# Patient Record
Sex: Male | Born: 1935
Health system: Southern US, Community
[De-identification: ages and names within clinical notes are randomized; demographics above are authoritative.]

## PROBLEM LIST (undated history)

## (undated) DIAGNOSIS — E785 Hyperlipidemia, unspecified: Secondary | ICD-10-CM

## (undated) DIAGNOSIS — G7000 Myasthenia gravis without (acute) exacerbation: Secondary | ICD-10-CM

## (undated) DIAGNOSIS — K219 Gastro-esophageal reflux disease without esophagitis: Secondary | ICD-10-CM

## (undated) DIAGNOSIS — G9520 Unspecified cord compression: Secondary | ICD-10-CM

## (undated) DIAGNOSIS — M1990 Unspecified osteoarthritis, unspecified site: Secondary | ICD-10-CM

## (undated) DIAGNOSIS — H409 Unspecified glaucoma: Secondary | ICD-10-CM

## (undated) DIAGNOSIS — J189 Pneumonia, unspecified organism: Secondary | ICD-10-CM

## (undated) DIAGNOSIS — S7291XA Unspecified fracture of right femur, initial encounter for closed fracture: Secondary | ICD-10-CM

## (undated) DIAGNOSIS — E039 Hypothyroidism, unspecified: Secondary | ICD-10-CM

## (undated) DIAGNOSIS — J4489 Other specified chronic obstructive pulmonary disease: Secondary | ICD-10-CM

## (undated) DIAGNOSIS — I10 Essential (primary) hypertension: Secondary | ICD-10-CM

## (undated) DIAGNOSIS — R202 Paresthesia of skin: Secondary | ICD-10-CM

## (undated) DIAGNOSIS — J449 Chronic obstructive pulmonary disease, unspecified: Secondary | ICD-10-CM

## (undated) DIAGNOSIS — I4821 Permanent atrial fibrillation: Secondary | ICD-10-CM

## (undated) HISTORY — DX: Unspecified osteoarthritis, unspecified site: M19.90

## (undated) HISTORY — DX: Gastro-esophageal reflux disease without esophagitis: K21.9

## (undated) HISTORY — PX: BACK SURGERY: SHX140

## (undated) HISTORY — DX: Myasthenia gravis without (acute) exacerbation: G70.00

## (undated) HISTORY — DX: Hypothyroidism, unspecified: E03.9

## (undated) HISTORY — DX: Chronic obstructive pulmonary disease, unspecified: J44.9

## (undated) HISTORY — PX: CHOLECYSTECTOMY: SHX55

## (undated) HISTORY — DX: Unspecified glaucoma: H40.9

## (undated) HISTORY — DX: Permanent atrial fibrillation: I48.21

## (undated) HISTORY — PX: CARPAL TUNNEL RELEASE: SHX101

## (undated) HISTORY — PX: REPLACEMENT TOTAL KNEE: SUR1224

## (undated) HISTORY — DX: Unspecified fracture of right femur, initial encounter for closed fracture: S72.91XA

## (undated) HISTORY — DX: Other specified chronic obstructive pulmonary disease: J44.89

## (undated) HISTORY — DX: Hyperlipidemia, unspecified: E78.5

## (undated) HISTORY — DX: Essential (primary) hypertension: I10

---

## 2014-09-30 DIAGNOSIS — L03114 Cellulitis of left upper limb: Secondary | ICD-10-CM | POA: Diagnosis not present

## 2014-09-30 DIAGNOSIS — N289 Disorder of kidney and ureter, unspecified: Secondary | ICD-10-CM | POA: Diagnosis not present

## 2014-09-30 DIAGNOSIS — G7000 Myasthenia gravis without (acute) exacerbation: Secondary | ICD-10-CM | POA: Diagnosis not present

## 2015-03-11 DIAGNOSIS — J4541 Moderate persistent asthma with (acute) exacerbation: Secondary | ICD-10-CM | POA: Diagnosis not present

## 2015-03-11 DIAGNOSIS — R05 Cough: Secondary | ICD-10-CM | POA: Diagnosis not present

## 2015-03-11 DIAGNOSIS — R0602 Shortness of breath: Secondary | ICD-10-CM | POA: Diagnosis not present

## 2015-03-11 DIAGNOSIS — G7000 Myasthenia gravis without (acute) exacerbation: Secondary | ICD-10-CM | POA: Diagnosis not present

## 2015-03-11 DIAGNOSIS — R062 Wheezing: Secondary | ICD-10-CM | POA: Diagnosis not present

## 2015-03-11 DIAGNOSIS — I482 Chronic atrial fibrillation: Secondary | ICD-10-CM | POA: Diagnosis not present

## 2015-03-27 DIAGNOSIS — H401132 Primary open-angle glaucoma, bilateral, moderate stage: Secondary | ICD-10-CM | POA: Diagnosis not present

## 2015-03-27 DIAGNOSIS — H401192 Primary open-angle glaucoma, unspecified eye, moderate stage: Secondary | ICD-10-CM | POA: Diagnosis not present

## 2015-03-27 DIAGNOSIS — H04123 Dry eye syndrome of bilateral lacrimal glands: Secondary | ICD-10-CM | POA: Diagnosis not present

## 2015-03-27 DIAGNOSIS — H2511 Age-related nuclear cataract, right eye: Secondary | ICD-10-CM | POA: Diagnosis not present

## 2015-03-27 DIAGNOSIS — Z961 Presence of intraocular lens: Secondary | ICD-10-CM | POA: Diagnosis not present

## 2015-03-30 DIAGNOSIS — H04123 Dry eye syndrome of bilateral lacrimal glands: Secondary | ICD-10-CM | POA: Diagnosis not present

## 2015-03-30 DIAGNOSIS — H2511 Age-related nuclear cataract, right eye: Secondary | ICD-10-CM | POA: Diagnosis not present

## 2015-03-30 DIAGNOSIS — Z961 Presence of intraocular lens: Secondary | ICD-10-CM | POA: Diagnosis not present

## 2015-03-30 DIAGNOSIS — H401192 Primary open-angle glaucoma, unspecified eye, moderate stage: Secondary | ICD-10-CM | POA: Diagnosis not present

## 2015-03-30 DIAGNOSIS — H401132 Primary open-angle glaucoma, bilateral, moderate stage: Secondary | ICD-10-CM | POA: Diagnosis not present

## 2015-03-31 DIAGNOSIS — B351 Tinea unguium: Secondary | ICD-10-CM | POA: Diagnosis not present

## 2015-03-31 DIAGNOSIS — M79672 Pain in left foot: Secondary | ICD-10-CM | POA: Diagnosis not present

## 2015-03-31 DIAGNOSIS — M79671 Pain in right foot: Secondary | ICD-10-CM | POA: Diagnosis not present

## 2015-04-03 DIAGNOSIS — I10 Essential (primary) hypertension: Secondary | ICD-10-CM | POA: Diagnosis not present

## 2015-04-03 DIAGNOSIS — R5383 Other fatigue: Secondary | ICD-10-CM | POA: Diagnosis not present

## 2015-04-03 DIAGNOSIS — H401132 Primary open-angle glaucoma, bilateral, moderate stage: Secondary | ICD-10-CM | POA: Diagnosis not present

## 2015-04-03 DIAGNOSIS — R942 Abnormal results of pulmonary function studies: Secondary | ICD-10-CM | POA: Diagnosis not present

## 2015-04-07 DIAGNOSIS — L570 Actinic keratosis: Secondary | ICD-10-CM | POA: Diagnosis not present

## 2015-04-21 DIAGNOSIS — G7000 Myasthenia gravis without (acute) exacerbation: Secondary | ICD-10-CM | POA: Diagnosis not present

## 2015-04-21 DIAGNOSIS — I482 Chronic atrial fibrillation: Secondary | ICD-10-CM | POA: Diagnosis not present

## 2015-04-21 DIAGNOSIS — R0602 Shortness of breath: Secondary | ICD-10-CM | POA: Diagnosis not present

## 2015-04-21 DIAGNOSIS — R05 Cough: Secondary | ICD-10-CM | POA: Diagnosis not present

## 2015-04-21 DIAGNOSIS — J4540 Moderate persistent asthma, uncomplicated: Secondary | ICD-10-CM | POA: Diagnosis not present

## 2015-04-21 DIAGNOSIS — R062 Wheezing: Secondary | ICD-10-CM | POA: Diagnosis not present

## 2015-05-01 DIAGNOSIS — G8929 Other chronic pain: Secondary | ICD-10-CM | POA: Diagnosis not present

## 2015-05-01 DIAGNOSIS — I10 Essential (primary) hypertension: Secondary | ICD-10-CM | POA: Diagnosis not present

## 2015-05-01 DIAGNOSIS — M549 Dorsalgia, unspecified: Secondary | ICD-10-CM | POA: Diagnosis not present

## 2015-05-01 DIAGNOSIS — R942 Abnormal results of pulmonary function studies: Secondary | ICD-10-CM | POA: Diagnosis not present

## 2015-05-19 DIAGNOSIS — M25529 Pain in unspecified elbow: Secondary | ICD-10-CM | POA: Diagnosis not present

## 2015-05-19 DIAGNOSIS — M7022 Olecranon bursitis, left elbow: Secondary | ICD-10-CM | POA: Diagnosis not present

## 2015-06-02 DIAGNOSIS — M7022 Olecranon bursitis, left elbow: Secondary | ICD-10-CM | POA: Diagnosis not present

## 2015-06-02 DIAGNOSIS — M25529 Pain in unspecified elbow: Secondary | ICD-10-CM | POA: Diagnosis not present

## 2015-06-09 DIAGNOSIS — M549 Dorsalgia, unspecified: Secondary | ICD-10-CM | POA: Diagnosis not present

## 2015-06-09 DIAGNOSIS — M7022 Olecranon bursitis, left elbow: Secondary | ICD-10-CM | POA: Diagnosis not present

## 2015-06-09 DIAGNOSIS — G8929 Other chronic pain: Secondary | ICD-10-CM | POA: Diagnosis not present

## 2015-06-09 DIAGNOSIS — M65341 Trigger finger, right ring finger: Secondary | ICD-10-CM | POA: Diagnosis not present

## 2015-06-10 DIAGNOSIS — E782 Mixed hyperlipidemia: Secondary | ICD-10-CM | POA: Diagnosis not present

## 2015-06-10 DIAGNOSIS — I10 Essential (primary) hypertension: Secondary | ICD-10-CM | POA: Diagnosis not present

## 2015-06-10 DIAGNOSIS — I4891 Unspecified atrial fibrillation: Secondary | ICD-10-CM | POA: Diagnosis not present

## 2015-06-16 DIAGNOSIS — I4891 Unspecified atrial fibrillation: Secondary | ICD-10-CM | POA: Diagnosis not present

## 2015-06-17 DIAGNOSIS — M7022 Olecranon bursitis, left elbow: Secondary | ICD-10-CM | POA: Diagnosis not present

## 2015-06-17 DIAGNOSIS — M65341 Trigger finger, right ring finger: Secondary | ICD-10-CM | POA: Diagnosis not present

## 2015-06-17 DIAGNOSIS — M779 Enthesopathy, unspecified: Secondary | ICD-10-CM | POA: Diagnosis not present

## 2015-06-17 DIAGNOSIS — I10 Essential (primary) hypertension: Secondary | ICD-10-CM | POA: Diagnosis not present

## 2015-06-17 DIAGNOSIS — L02414 Cutaneous abscess of left upper limb: Secondary | ICD-10-CM | POA: Diagnosis not present

## 2015-06-17 DIAGNOSIS — E039 Hypothyroidism, unspecified: Secondary | ICD-10-CM | POA: Diagnosis not present

## 2015-06-17 DIAGNOSIS — E559 Vitamin D deficiency, unspecified: Secondary | ICD-10-CM | POA: Diagnosis not present

## 2015-06-17 DIAGNOSIS — T814XXA Infection following a procedure, initial encounter: Secondary | ICD-10-CM | POA: Diagnosis not present

## 2015-06-17 DIAGNOSIS — I4891 Unspecified atrial fibrillation: Secondary | ICD-10-CM | POA: Diagnosis not present

## 2015-06-17 DIAGNOSIS — G4700 Insomnia, unspecified: Secondary | ICD-10-CM | POA: Diagnosis not present

## 2015-06-17 DIAGNOSIS — E785 Hyperlipidemia, unspecified: Secondary | ICD-10-CM | POA: Diagnosis not present

## 2015-06-17 DIAGNOSIS — Z96653 Presence of artificial knee joint, bilateral: Secondary | ICD-10-CM | POA: Diagnosis not present

## 2015-06-17 DIAGNOSIS — Z9181 History of falling: Secondary | ICD-10-CM | POA: Diagnosis not present

## 2015-06-30 DIAGNOSIS — L97521 Non-pressure chronic ulcer of other part of left foot limited to breakdown of skin: Secondary | ICD-10-CM | POA: Diagnosis not present

## 2015-06-30 DIAGNOSIS — M71121 Other infective bursitis, right elbow: Secondary | ICD-10-CM | POA: Diagnosis not present

## 2015-06-30 DIAGNOSIS — R52 Pain, unspecified: Secondary | ICD-10-CM | POA: Diagnosis not present

## 2015-06-30 DIAGNOSIS — D72828 Other elevated white blood cell count: Secondary | ICD-10-CM | POA: Diagnosis not present

## 2015-06-30 DIAGNOSIS — G7000 Myasthenia gravis without (acute) exacerbation: Secondary | ICD-10-CM | POA: Diagnosis not present

## 2015-06-30 DIAGNOSIS — A4101 Sepsis due to Methicillin susceptible Staphylococcus aureus: Secondary | ICD-10-CM | POA: Diagnosis not present

## 2015-06-30 DIAGNOSIS — I10 Essential (primary) hypertension: Secondary | ICD-10-CM | POA: Diagnosis not present

## 2015-06-30 DIAGNOSIS — L089 Local infection of the skin and subcutaneous tissue, unspecified: Secondary | ICD-10-CM | POA: Diagnosis not present

## 2015-06-30 DIAGNOSIS — I361 Nonrheumatic tricuspid (valve) insufficiency: Secondary | ICD-10-CM | POA: Diagnosis not present

## 2015-06-30 DIAGNOSIS — M792 Neuralgia and neuritis, unspecified: Secondary | ICD-10-CM | POA: Diagnosis not present

## 2015-06-30 DIAGNOSIS — Z7401 Bed confinement status: Secondary | ICD-10-CM | POA: Diagnosis not present

## 2015-06-30 DIAGNOSIS — M7022 Olecranon bursitis, left elbow: Secondary | ICD-10-CM | POA: Diagnosis not present

## 2015-06-30 DIAGNOSIS — M25512 Pain in left shoulder: Secondary | ICD-10-CM | POA: Diagnosis not present

## 2015-06-30 DIAGNOSIS — D62 Acute posthemorrhagic anemia: Secondary | ICD-10-CM | POA: Diagnosis not present

## 2015-06-30 DIAGNOSIS — G894 Chronic pain syndrome: Secondary | ICD-10-CM | POA: Diagnosis not present

## 2015-06-30 DIAGNOSIS — M79602 Pain in left arm: Secondary | ICD-10-CM | POA: Diagnosis not present

## 2015-06-30 DIAGNOSIS — R609 Edema, unspecified: Secondary | ICD-10-CM | POA: Diagnosis not present

## 2015-06-30 DIAGNOSIS — R296 Repeated falls: Secondary | ICD-10-CM | POA: Diagnosis not present

## 2015-06-30 DIAGNOSIS — G629 Polyneuropathy, unspecified: Secondary | ICD-10-CM | POA: Diagnosis not present

## 2015-06-30 DIAGNOSIS — L97511 Non-pressure chronic ulcer of other part of right foot limited to breakdown of skin: Secondary | ICD-10-CM | POA: Diagnosis not present

## 2015-06-30 DIAGNOSIS — I482 Chronic atrial fibrillation: Secondary | ICD-10-CM | POA: Diagnosis not present

## 2015-06-30 DIAGNOSIS — M25361 Other instability, right knee: Secondary | ICD-10-CM | POA: Diagnosis not present

## 2015-06-30 DIAGNOSIS — M545 Low back pain: Secondary | ICD-10-CM | POA: Diagnosis not present

## 2015-06-30 DIAGNOSIS — G8918 Other acute postprocedural pain: Secondary | ICD-10-CM | POA: Diagnosis not present

## 2015-06-30 DIAGNOSIS — E782 Mixed hyperlipidemia: Secondary | ICD-10-CM | POA: Diagnosis not present

## 2015-06-30 DIAGNOSIS — L03114 Cellulitis of left upper limb: Secondary | ICD-10-CM | POA: Diagnosis not present

## 2015-06-30 DIAGNOSIS — E871 Hypo-osmolality and hyponatremia: Secondary | ICD-10-CM | POA: Diagnosis not present

## 2015-06-30 DIAGNOSIS — R7881 Bacteremia: Secondary | ICD-10-CM | POA: Diagnosis not present

## 2015-06-30 DIAGNOSIS — R262 Difficulty in walking, not elsewhere classified: Secondary | ICD-10-CM | POA: Diagnosis not present

## 2015-06-30 DIAGNOSIS — M86132 Other acute osteomyelitis, left radius and ulna: Secondary | ICD-10-CM | POA: Diagnosis not present

## 2015-06-30 DIAGNOSIS — N289 Disorder of kidney and ureter, unspecified: Secondary | ICD-10-CM | POA: Diagnosis not present

## 2015-06-30 DIAGNOSIS — Z9889 Other specified postprocedural states: Secondary | ICD-10-CM | POA: Diagnosis not present

## 2015-06-30 DIAGNOSIS — T814XXA Infection following a procedure, initial encounter: Secondary | ICD-10-CM | POA: Diagnosis not present

## 2015-06-30 DIAGNOSIS — I129 Hypertensive chronic kidney disease with stage 1 through stage 4 chronic kidney disease, or unspecified chronic kidney disease: Secondary | ICD-10-CM | POA: Diagnosis not present

## 2015-06-30 DIAGNOSIS — M6281 Muscle weakness (generalized): Secondary | ICD-10-CM | POA: Diagnosis not present

## 2015-06-30 DIAGNOSIS — B9561 Methicillin susceptible Staphylococcus aureus infection as the cause of diseases classified elsewhere: Secondary | ICD-10-CM | POA: Diagnosis not present

## 2015-06-30 DIAGNOSIS — Z792 Long term (current) use of antibiotics: Secondary | ICD-10-CM | POA: Diagnosis not present

## 2015-06-30 DIAGNOSIS — L02414 Cutaneous abscess of left upper limb: Secondary | ICD-10-CM | POA: Diagnosis not present

## 2015-06-30 DIAGNOSIS — I4891 Unspecified atrial fibrillation: Secondary | ICD-10-CM | POA: Diagnosis not present

## 2015-06-30 DIAGNOSIS — M79671 Pain in right foot: Secondary | ICD-10-CM | POA: Diagnosis not present

## 2015-06-30 DIAGNOSIS — R69 Illness, unspecified: Secondary | ICD-10-CM | POA: Diagnosis not present

## 2015-06-30 DIAGNOSIS — R5381 Other malaise: Secondary | ICD-10-CM | POA: Diagnosis not present

## 2015-06-30 DIAGNOSIS — R2681 Unsteadiness on feet: Secondary | ICD-10-CM | POA: Diagnosis not present

## 2015-06-30 DIAGNOSIS — M79672 Pain in left foot: Secondary | ICD-10-CM | POA: Diagnosis not present

## 2015-06-30 DIAGNOSIS — M25522 Pain in left elbow: Secondary | ICD-10-CM | POA: Diagnosis not present

## 2015-07-16 DIAGNOSIS — R5381 Other malaise: Secondary | ICD-10-CM | POA: Diagnosis not present

## 2015-07-16 DIAGNOSIS — L309 Dermatitis, unspecified: Secondary | ICD-10-CM | POA: Diagnosis not present

## 2015-07-16 DIAGNOSIS — M545 Low back pain: Secondary | ICD-10-CM | POA: Diagnosis not present

## 2015-07-16 DIAGNOSIS — Z7401 Bed confinement status: Secondary | ICD-10-CM | POA: Diagnosis not present

## 2015-07-16 DIAGNOSIS — G8929 Other chronic pain: Secondary | ICD-10-CM | POA: Diagnosis not present

## 2015-07-16 DIAGNOSIS — E039 Hypothyroidism, unspecified: Secondary | ICD-10-CM | POA: Diagnosis not present

## 2015-07-16 DIAGNOSIS — R2681 Unsteadiness on feet: Secondary | ICD-10-CM | POA: Diagnosis not present

## 2015-07-16 DIAGNOSIS — G894 Chronic pain syndrome: Secondary | ICD-10-CM | POA: Diagnosis not present

## 2015-07-16 DIAGNOSIS — L97511 Non-pressure chronic ulcer of other part of right foot limited to breakdown of skin: Secondary | ICD-10-CM | POA: Diagnosis not present

## 2015-07-16 DIAGNOSIS — R69 Illness, unspecified: Secondary | ICD-10-CM | POA: Diagnosis not present

## 2015-07-16 DIAGNOSIS — E559 Vitamin D deficiency, unspecified: Secondary | ICD-10-CM | POA: Diagnosis not present

## 2015-07-16 DIAGNOSIS — A419 Sepsis, unspecified organism: Secondary | ICD-10-CM | POA: Diagnosis not present

## 2015-07-16 DIAGNOSIS — D72828 Other elevated white blood cell count: Secondary | ICD-10-CM | POA: Diagnosis not present

## 2015-07-16 DIAGNOSIS — G7000 Myasthenia gravis without (acute) exacerbation: Secondary | ICD-10-CM | POA: Diagnosis not present

## 2015-07-16 DIAGNOSIS — R262 Difficulty in walking, not elsewhere classified: Secondary | ICD-10-CM | POA: Diagnosis not present

## 2015-07-16 DIAGNOSIS — L03114 Cellulitis of left upper limb: Secondary | ICD-10-CM | POA: Diagnosis not present

## 2015-07-16 DIAGNOSIS — I10 Essential (primary) hypertension: Secondary | ICD-10-CM | POA: Diagnosis not present

## 2015-07-16 DIAGNOSIS — E785 Hyperlipidemia, unspecified: Secondary | ICD-10-CM | POA: Diagnosis not present

## 2015-07-16 DIAGNOSIS — J45909 Unspecified asthma, uncomplicated: Secondary | ICD-10-CM | POA: Diagnosis not present

## 2015-07-16 DIAGNOSIS — R7881 Bacteremia: Secondary | ICD-10-CM | POA: Diagnosis not present

## 2015-07-16 DIAGNOSIS — I482 Chronic atrial fibrillation: Secondary | ICD-10-CM | POA: Diagnosis not present

## 2015-07-16 DIAGNOSIS — B9561 Methicillin susceptible Staphylococcus aureus infection as the cause of diseases classified elsewhere: Secondary | ICD-10-CM | POA: Diagnosis not present

## 2015-07-16 DIAGNOSIS — I4891 Unspecified atrial fibrillation: Secondary | ICD-10-CM | POA: Diagnosis not present

## 2015-07-16 DIAGNOSIS — Z6825 Body mass index (BMI) 25.0-25.9, adult: Secondary | ICD-10-CM | POA: Diagnosis not present

## 2015-07-16 DIAGNOSIS — M6281 Muscle weakness (generalized): Secondary | ICD-10-CM | POA: Diagnosis not present

## 2015-07-16 DIAGNOSIS — R52 Pain, unspecified: Secondary | ICD-10-CM | POA: Diagnosis not present

## 2015-07-16 DIAGNOSIS — M71121 Other infective bursitis, right elbow: Secondary | ICD-10-CM | POA: Diagnosis not present

## 2015-07-16 DIAGNOSIS — N289 Disorder of kidney and ureter, unspecified: Secondary | ICD-10-CM | POA: Diagnosis not present

## 2015-07-16 DIAGNOSIS — L97521 Non-pressure chronic ulcer of other part of left foot limited to breakdown of skin: Secondary | ICD-10-CM | POA: Diagnosis not present

## 2015-07-16 DIAGNOSIS — M7022 Olecranon bursitis, left elbow: Secondary | ICD-10-CM | POA: Diagnosis not present

## 2015-07-16 DIAGNOSIS — K219 Gastro-esophageal reflux disease without esophagitis: Secondary | ICD-10-CM | POA: Diagnosis not present

## 2015-07-16 DIAGNOSIS — M79602 Pain in left arm: Secondary | ICD-10-CM | POA: Diagnosis not present

## 2015-07-16 DIAGNOSIS — L0390 Cellulitis, unspecified: Secondary | ICD-10-CM | POA: Diagnosis not present

## 2015-07-16 DIAGNOSIS — E782 Mixed hyperlipidemia: Secondary | ICD-10-CM | POA: Diagnosis not present

## 2015-07-17 DIAGNOSIS — L03114 Cellulitis of left upper limb: Secondary | ICD-10-CM | POA: Diagnosis not present

## 2015-07-17 DIAGNOSIS — N289 Disorder of kidney and ureter, unspecified: Secondary | ICD-10-CM | POA: Diagnosis not present

## 2015-07-17 DIAGNOSIS — M71121 Other infective bursitis, right elbow: Secondary | ICD-10-CM | POA: Diagnosis not present

## 2015-07-17 DIAGNOSIS — G894 Chronic pain syndrome: Secondary | ICD-10-CM | POA: Diagnosis not present

## 2015-07-17 DIAGNOSIS — B9561 Methicillin susceptible Staphylococcus aureus infection as the cause of diseases classified elsewhere: Secondary | ICD-10-CM | POA: Diagnosis not present

## 2015-07-17 DIAGNOSIS — G7000 Myasthenia gravis without (acute) exacerbation: Secondary | ICD-10-CM | POA: Diagnosis not present

## 2015-07-19 DIAGNOSIS — I4891 Unspecified atrial fibrillation: Secondary | ICD-10-CM | POA: Diagnosis not present

## 2015-07-19 DIAGNOSIS — R52 Pain, unspecified: Secondary | ICD-10-CM | POA: Diagnosis not present

## 2015-07-19 DIAGNOSIS — L0390 Cellulitis, unspecified: Secondary | ICD-10-CM | POA: Diagnosis not present

## 2015-07-19 DIAGNOSIS — M7022 Olecranon bursitis, left elbow: Secondary | ICD-10-CM | POA: Diagnosis not present

## 2015-07-20 DIAGNOSIS — G7000 Myasthenia gravis without (acute) exacerbation: Secondary | ICD-10-CM | POA: Diagnosis not present

## 2015-07-20 DIAGNOSIS — B9561 Methicillin susceptible Staphylococcus aureus infection as the cause of diseases classified elsewhere: Secondary | ICD-10-CM | POA: Diagnosis not present

## 2015-07-20 DIAGNOSIS — M71121 Other infective bursitis, right elbow: Secondary | ICD-10-CM | POA: Diagnosis not present

## 2015-07-20 DIAGNOSIS — A419 Sepsis, unspecified organism: Secondary | ICD-10-CM | POA: Diagnosis not present

## 2015-07-20 DIAGNOSIS — L03114 Cellulitis of left upper limb: Secondary | ICD-10-CM | POA: Diagnosis not present

## 2015-07-20 DIAGNOSIS — I10 Essential (primary) hypertension: Secondary | ICD-10-CM | POA: Diagnosis not present

## 2015-07-20 DIAGNOSIS — L97511 Non-pressure chronic ulcer of other part of right foot limited to breakdown of skin: Secondary | ICD-10-CM | POA: Diagnosis not present

## 2015-07-20 DIAGNOSIS — I4891 Unspecified atrial fibrillation: Secondary | ICD-10-CM | POA: Diagnosis not present

## 2015-07-20 DIAGNOSIS — N289 Disorder of kidney and ureter, unspecified: Secondary | ICD-10-CM | POA: Diagnosis not present

## 2015-07-20 DIAGNOSIS — G894 Chronic pain syndrome: Secondary | ICD-10-CM | POA: Diagnosis not present

## 2015-07-21 DIAGNOSIS — G7000 Myasthenia gravis without (acute) exacerbation: Secondary | ICD-10-CM | POA: Diagnosis not present

## 2015-07-21 DIAGNOSIS — N289 Disorder of kidney and ureter, unspecified: Secondary | ICD-10-CM | POA: Diagnosis not present

## 2015-07-21 DIAGNOSIS — I4891 Unspecified atrial fibrillation: Secondary | ICD-10-CM | POA: Diagnosis not present

## 2015-07-21 DIAGNOSIS — E785 Hyperlipidemia, unspecified: Secondary | ICD-10-CM | POA: Diagnosis not present

## 2015-07-21 DIAGNOSIS — L03114 Cellulitis of left upper limb: Secondary | ICD-10-CM | POA: Diagnosis not present

## 2015-07-21 DIAGNOSIS — M71121 Other infective bursitis, right elbow: Secondary | ICD-10-CM | POA: Diagnosis not present

## 2015-07-21 DIAGNOSIS — I10 Essential (primary) hypertension: Secondary | ICD-10-CM | POA: Diagnosis not present

## 2015-07-21 DIAGNOSIS — E559 Vitamin D deficiency, unspecified: Secondary | ICD-10-CM | POA: Diagnosis not present

## 2015-07-21 DIAGNOSIS — L97511 Non-pressure chronic ulcer of other part of right foot limited to breakdown of skin: Secondary | ICD-10-CM | POA: Diagnosis not present

## 2015-07-21 DIAGNOSIS — R69 Illness, unspecified: Secondary | ICD-10-CM | POA: Diagnosis not present

## 2015-07-21 DIAGNOSIS — J45909 Unspecified asthma, uncomplicated: Secondary | ICD-10-CM | POA: Diagnosis not present

## 2015-07-21 DIAGNOSIS — K219 Gastro-esophageal reflux disease without esophagitis: Secondary | ICD-10-CM | POA: Diagnosis not present

## 2015-07-21 DIAGNOSIS — G894 Chronic pain syndrome: Secondary | ICD-10-CM | POA: Diagnosis not present

## 2015-07-21 DIAGNOSIS — Z6825 Body mass index (BMI) 25.0-25.9, adult: Secondary | ICD-10-CM | POA: Diagnosis not present

## 2015-07-21 DIAGNOSIS — B9561 Methicillin susceptible Staphylococcus aureus infection as the cause of diseases classified elsewhere: Secondary | ICD-10-CM | POA: Diagnosis not present

## 2015-07-21 DIAGNOSIS — E039 Hypothyroidism, unspecified: Secondary | ICD-10-CM | POA: Diagnosis not present

## 2015-07-22 DIAGNOSIS — N289 Disorder of kidney and ureter, unspecified: Secondary | ICD-10-CM | POA: Diagnosis not present

## 2015-07-22 DIAGNOSIS — M71121 Other infective bursitis, right elbow: Secondary | ICD-10-CM | POA: Diagnosis not present

## 2015-07-22 DIAGNOSIS — G7000 Myasthenia gravis without (acute) exacerbation: Secondary | ICD-10-CM | POA: Diagnosis not present

## 2015-07-22 DIAGNOSIS — L03114 Cellulitis of left upper limb: Secondary | ICD-10-CM | POA: Diagnosis not present

## 2015-07-22 DIAGNOSIS — G894 Chronic pain syndrome: Secondary | ICD-10-CM | POA: Diagnosis not present

## 2015-07-22 DIAGNOSIS — L97511 Non-pressure chronic ulcer of other part of right foot limited to breakdown of skin: Secondary | ICD-10-CM | POA: Diagnosis not present

## 2015-07-22 DIAGNOSIS — B9561 Methicillin susceptible Staphylococcus aureus infection as the cause of diseases classified elsewhere: Secondary | ICD-10-CM | POA: Diagnosis not present

## 2015-07-23 DIAGNOSIS — N289 Disorder of kidney and ureter, unspecified: Secondary | ICD-10-CM | POA: Diagnosis not present

## 2015-07-23 DIAGNOSIS — L03114 Cellulitis of left upper limb: Secondary | ICD-10-CM | POA: Diagnosis not present

## 2015-07-23 DIAGNOSIS — G7000 Myasthenia gravis without (acute) exacerbation: Secondary | ICD-10-CM | POA: Diagnosis not present

## 2015-07-23 DIAGNOSIS — M71121 Other infective bursitis, right elbow: Secondary | ICD-10-CM | POA: Diagnosis not present

## 2015-07-23 DIAGNOSIS — L97511 Non-pressure chronic ulcer of other part of right foot limited to breakdown of skin: Secondary | ICD-10-CM | POA: Diagnosis not present

## 2015-07-23 DIAGNOSIS — B9561 Methicillin susceptible Staphylococcus aureus infection as the cause of diseases classified elsewhere: Secondary | ICD-10-CM | POA: Diagnosis not present

## 2015-07-23 DIAGNOSIS — G894 Chronic pain syndrome: Secondary | ICD-10-CM | POA: Diagnosis not present

## 2015-07-24 DIAGNOSIS — G7000 Myasthenia gravis without (acute) exacerbation: Secondary | ICD-10-CM | POA: Diagnosis not present

## 2015-07-24 DIAGNOSIS — L97511 Non-pressure chronic ulcer of other part of right foot limited to breakdown of skin: Secondary | ICD-10-CM | POA: Diagnosis not present

## 2015-07-24 DIAGNOSIS — M71121 Other infective bursitis, right elbow: Secondary | ICD-10-CM | POA: Diagnosis not present

## 2015-07-24 DIAGNOSIS — L03114 Cellulitis of left upper limb: Secondary | ICD-10-CM | POA: Diagnosis not present

## 2015-07-24 DIAGNOSIS — N289 Disorder of kidney and ureter, unspecified: Secondary | ICD-10-CM | POA: Diagnosis not present

## 2015-07-24 DIAGNOSIS — G894 Chronic pain syndrome: Secondary | ICD-10-CM | POA: Diagnosis not present

## 2015-07-24 DIAGNOSIS — B9561 Methicillin susceptible Staphylococcus aureus infection as the cause of diseases classified elsewhere: Secondary | ICD-10-CM | POA: Diagnosis not present

## 2015-07-25 DIAGNOSIS — I4891 Unspecified atrial fibrillation: Secondary | ICD-10-CM | POA: Diagnosis not present

## 2015-07-25 DIAGNOSIS — R52 Pain, unspecified: Secondary | ICD-10-CM | POA: Diagnosis not present

## 2015-07-25 DIAGNOSIS — L0390 Cellulitis, unspecified: Secondary | ICD-10-CM | POA: Diagnosis not present

## 2015-07-25 DIAGNOSIS — M7022 Olecranon bursitis, left elbow: Secondary | ICD-10-CM | POA: Diagnosis not present

## 2015-07-27 DIAGNOSIS — M71121 Other infective bursitis, right elbow: Secondary | ICD-10-CM | POA: Diagnosis not present

## 2015-07-27 DIAGNOSIS — G7000 Myasthenia gravis without (acute) exacerbation: Secondary | ICD-10-CM | POA: Diagnosis not present

## 2015-07-27 DIAGNOSIS — N289 Disorder of kidney and ureter, unspecified: Secondary | ICD-10-CM | POA: Diagnosis not present

## 2015-07-27 DIAGNOSIS — L03114 Cellulitis of left upper limb: Secondary | ICD-10-CM | POA: Diagnosis not present

## 2015-07-27 DIAGNOSIS — G894 Chronic pain syndrome: Secondary | ICD-10-CM | POA: Diagnosis not present

## 2015-07-27 DIAGNOSIS — B9561 Methicillin susceptible Staphylococcus aureus infection as the cause of diseases classified elsewhere: Secondary | ICD-10-CM | POA: Diagnosis not present

## 2015-07-27 DIAGNOSIS — L97511 Non-pressure chronic ulcer of other part of right foot limited to breakdown of skin: Secondary | ICD-10-CM | POA: Diagnosis not present

## 2015-07-28 DIAGNOSIS — G7000 Myasthenia gravis without (acute) exacerbation: Secondary | ICD-10-CM | POA: Diagnosis not present

## 2015-07-28 DIAGNOSIS — G894 Chronic pain syndrome: Secondary | ICD-10-CM | POA: Diagnosis not present

## 2015-07-28 DIAGNOSIS — M71121 Other infective bursitis, right elbow: Secondary | ICD-10-CM | POA: Diagnosis not present

## 2015-07-28 DIAGNOSIS — N289 Disorder of kidney and ureter, unspecified: Secondary | ICD-10-CM | POA: Diagnosis not present

## 2015-07-28 DIAGNOSIS — L97511 Non-pressure chronic ulcer of other part of right foot limited to breakdown of skin: Secondary | ICD-10-CM | POA: Diagnosis not present

## 2015-07-28 DIAGNOSIS — E039 Hypothyroidism, unspecified: Secondary | ICD-10-CM | POA: Diagnosis not present

## 2015-07-28 DIAGNOSIS — L03114 Cellulitis of left upper limb: Secondary | ICD-10-CM | POA: Diagnosis not present

## 2015-07-28 DIAGNOSIS — G8929 Other chronic pain: Secondary | ICD-10-CM | POA: Diagnosis not present

## 2015-07-28 DIAGNOSIS — I4891 Unspecified atrial fibrillation: Secondary | ICD-10-CM | POA: Diagnosis not present

## 2015-07-28 DIAGNOSIS — B9561 Methicillin susceptible Staphylococcus aureus infection as the cause of diseases classified elsewhere: Secondary | ICD-10-CM | POA: Diagnosis not present

## 2015-07-29 DIAGNOSIS — B9561 Methicillin susceptible Staphylococcus aureus infection as the cause of diseases classified elsewhere: Secondary | ICD-10-CM | POA: Diagnosis not present

## 2015-07-29 DIAGNOSIS — G7000 Myasthenia gravis without (acute) exacerbation: Secondary | ICD-10-CM | POA: Diagnosis not present

## 2015-07-29 DIAGNOSIS — M7022 Olecranon bursitis, left elbow: Secondary | ICD-10-CM | POA: Diagnosis not present

## 2015-07-29 DIAGNOSIS — L0390 Cellulitis, unspecified: Secondary | ICD-10-CM | POA: Diagnosis not present

## 2015-07-29 DIAGNOSIS — G894 Chronic pain syndrome: Secondary | ICD-10-CM | POA: Diagnosis not present

## 2015-07-29 DIAGNOSIS — I4891 Unspecified atrial fibrillation: Secondary | ICD-10-CM | POA: Diagnosis not present

## 2015-07-29 DIAGNOSIS — N289 Disorder of kidney and ureter, unspecified: Secondary | ICD-10-CM | POA: Diagnosis not present

## 2015-07-29 DIAGNOSIS — M71121 Other infective bursitis, right elbow: Secondary | ICD-10-CM | POA: Diagnosis not present

## 2015-07-29 DIAGNOSIS — L03114 Cellulitis of left upper limb: Secondary | ICD-10-CM | POA: Diagnosis not present

## 2015-07-29 DIAGNOSIS — R52 Pain, unspecified: Secondary | ICD-10-CM | POA: Diagnosis not present

## 2015-07-29 DIAGNOSIS — L97511 Non-pressure chronic ulcer of other part of right foot limited to breakdown of skin: Secondary | ICD-10-CM | POA: Diagnosis not present

## 2015-07-31 DIAGNOSIS — N289 Disorder of kidney and ureter, unspecified: Secondary | ICD-10-CM | POA: Diagnosis not present

## 2015-07-31 DIAGNOSIS — B9561 Methicillin susceptible Staphylococcus aureus infection as the cause of diseases classified elsewhere: Secondary | ICD-10-CM | POA: Diagnosis not present

## 2015-07-31 DIAGNOSIS — G7000 Myasthenia gravis without (acute) exacerbation: Secondary | ICD-10-CM | POA: Diagnosis not present

## 2015-07-31 DIAGNOSIS — M71121 Other infective bursitis, right elbow: Secondary | ICD-10-CM | POA: Diagnosis not present

## 2015-07-31 DIAGNOSIS — L03114 Cellulitis of left upper limb: Secondary | ICD-10-CM | POA: Diagnosis not present

## 2015-07-31 DIAGNOSIS — G894 Chronic pain syndrome: Secondary | ICD-10-CM | POA: Diagnosis not present

## 2015-07-31 DIAGNOSIS — L97511 Non-pressure chronic ulcer of other part of right foot limited to breakdown of skin: Secondary | ICD-10-CM | POA: Diagnosis not present

## 2015-08-03 DIAGNOSIS — M71121 Other infective bursitis, right elbow: Secondary | ICD-10-CM | POA: Diagnosis not present

## 2015-08-03 DIAGNOSIS — L97511 Non-pressure chronic ulcer of other part of right foot limited to breakdown of skin: Secondary | ICD-10-CM | POA: Diagnosis not present

## 2015-08-03 DIAGNOSIS — G7000 Myasthenia gravis without (acute) exacerbation: Secondary | ICD-10-CM | POA: Diagnosis not present

## 2015-08-03 DIAGNOSIS — R69 Illness, unspecified: Secondary | ICD-10-CM | POA: Diagnosis not present

## 2015-08-03 DIAGNOSIS — L03114 Cellulitis of left upper limb: Secondary | ICD-10-CM | POA: Diagnosis not present

## 2015-08-03 DIAGNOSIS — I4891 Unspecified atrial fibrillation: Secondary | ICD-10-CM | POA: Diagnosis not present

## 2015-08-03 DIAGNOSIS — A419 Sepsis, unspecified organism: Secondary | ICD-10-CM | POA: Diagnosis not present

## 2015-08-03 DIAGNOSIS — G894 Chronic pain syndrome: Secondary | ICD-10-CM | POA: Diagnosis not present

## 2015-08-03 DIAGNOSIS — N289 Disorder of kidney and ureter, unspecified: Secondary | ICD-10-CM | POA: Diagnosis not present

## 2015-08-03 DIAGNOSIS — I10 Essential (primary) hypertension: Secondary | ICD-10-CM | POA: Diagnosis not present

## 2015-08-03 DIAGNOSIS — B9561 Methicillin susceptible Staphylococcus aureus infection as the cause of diseases classified elsewhere: Secondary | ICD-10-CM | POA: Diagnosis not present

## 2015-08-04 DIAGNOSIS — G894 Chronic pain syndrome: Secondary | ICD-10-CM | POA: Diagnosis not present

## 2015-08-04 DIAGNOSIS — L309 Dermatitis, unspecified: Secondary | ICD-10-CM | POA: Diagnosis not present

## 2015-08-04 DIAGNOSIS — I10 Essential (primary) hypertension: Secondary | ICD-10-CM | POA: Diagnosis not present

## 2015-08-04 DIAGNOSIS — M71121 Other infective bursitis, right elbow: Secondary | ICD-10-CM | POA: Diagnosis not present

## 2015-08-04 DIAGNOSIS — N289 Disorder of kidney and ureter, unspecified: Secondary | ICD-10-CM | POA: Diagnosis not present

## 2015-08-04 DIAGNOSIS — L97511 Non-pressure chronic ulcer of other part of right foot limited to breakdown of skin: Secondary | ICD-10-CM | POA: Diagnosis not present

## 2015-08-04 DIAGNOSIS — I4891 Unspecified atrial fibrillation: Secondary | ICD-10-CM | POA: Diagnosis not present

## 2015-08-04 DIAGNOSIS — L03114 Cellulitis of left upper limb: Secondary | ICD-10-CM | POA: Diagnosis not present

## 2015-08-04 DIAGNOSIS — B9561 Methicillin susceptible Staphylococcus aureus infection as the cause of diseases classified elsewhere: Secondary | ICD-10-CM | POA: Diagnosis not present

## 2015-08-04 DIAGNOSIS — G7000 Myasthenia gravis without (acute) exacerbation: Secondary | ICD-10-CM | POA: Diagnosis not present

## 2015-08-05 DIAGNOSIS — N289 Disorder of kidney and ureter, unspecified: Secondary | ICD-10-CM | POA: Diagnosis not present

## 2015-08-05 DIAGNOSIS — B9561 Methicillin susceptible Staphylococcus aureus infection as the cause of diseases classified elsewhere: Secondary | ICD-10-CM | POA: Diagnosis not present

## 2015-08-05 DIAGNOSIS — L03114 Cellulitis of left upper limb: Secondary | ICD-10-CM | POA: Diagnosis not present

## 2015-08-05 DIAGNOSIS — G7000 Myasthenia gravis without (acute) exacerbation: Secondary | ICD-10-CM | POA: Diagnosis not present

## 2015-08-05 DIAGNOSIS — L97511 Non-pressure chronic ulcer of other part of right foot limited to breakdown of skin: Secondary | ICD-10-CM | POA: Diagnosis not present

## 2015-08-05 DIAGNOSIS — G894 Chronic pain syndrome: Secondary | ICD-10-CM | POA: Diagnosis not present

## 2015-08-05 DIAGNOSIS — M71121 Other infective bursitis, right elbow: Secondary | ICD-10-CM | POA: Diagnosis not present

## 2015-08-07 DIAGNOSIS — M7022 Olecranon bursitis, left elbow: Secondary | ICD-10-CM | POA: Diagnosis not present

## 2015-08-07 DIAGNOSIS — L0390 Cellulitis, unspecified: Secondary | ICD-10-CM | POA: Diagnosis not present

## 2015-08-07 DIAGNOSIS — I4891 Unspecified atrial fibrillation: Secondary | ICD-10-CM | POA: Diagnosis not present

## 2015-08-07 DIAGNOSIS — R52 Pain, unspecified: Secondary | ICD-10-CM | POA: Diagnosis not present

## 2015-08-10 DIAGNOSIS — L97511 Non-pressure chronic ulcer of other part of right foot limited to breakdown of skin: Secondary | ICD-10-CM | POA: Diagnosis not present

## 2015-08-10 DIAGNOSIS — L03114 Cellulitis of left upper limb: Secondary | ICD-10-CM | POA: Diagnosis not present

## 2015-08-10 DIAGNOSIS — G894 Chronic pain syndrome: Secondary | ICD-10-CM | POA: Diagnosis not present

## 2015-08-10 DIAGNOSIS — G7000 Myasthenia gravis without (acute) exacerbation: Secondary | ICD-10-CM | POA: Diagnosis not present

## 2015-08-10 DIAGNOSIS — M71121 Other infective bursitis, right elbow: Secondary | ICD-10-CM | POA: Diagnosis not present

## 2015-08-10 DIAGNOSIS — N289 Disorder of kidney and ureter, unspecified: Secondary | ICD-10-CM | POA: Diagnosis not present

## 2015-08-10 DIAGNOSIS — B9561 Methicillin susceptible Staphylococcus aureus infection as the cause of diseases classified elsewhere: Secondary | ICD-10-CM | POA: Diagnosis not present

## 2015-08-11 DIAGNOSIS — B9561 Methicillin susceptible Staphylococcus aureus infection as the cause of diseases classified elsewhere: Secondary | ICD-10-CM | POA: Diagnosis not present

## 2015-08-11 DIAGNOSIS — N289 Disorder of kidney and ureter, unspecified: Secondary | ICD-10-CM | POA: Diagnosis not present

## 2015-08-11 DIAGNOSIS — L97511 Non-pressure chronic ulcer of other part of right foot limited to breakdown of skin: Secondary | ICD-10-CM | POA: Diagnosis not present

## 2015-08-11 DIAGNOSIS — G7000 Myasthenia gravis without (acute) exacerbation: Secondary | ICD-10-CM | POA: Diagnosis not present

## 2015-08-11 DIAGNOSIS — L03114 Cellulitis of left upper limb: Secondary | ICD-10-CM | POA: Diagnosis not present

## 2015-08-11 DIAGNOSIS — G894 Chronic pain syndrome: Secondary | ICD-10-CM | POA: Diagnosis not present

## 2015-08-11 DIAGNOSIS — M71121 Other infective bursitis, right elbow: Secondary | ICD-10-CM | POA: Diagnosis not present

## 2015-08-12 DIAGNOSIS — G894 Chronic pain syndrome: Secondary | ICD-10-CM | POA: Diagnosis not present

## 2015-08-12 DIAGNOSIS — R262 Difficulty in walking, not elsewhere classified: Secondary | ICD-10-CM | POA: Diagnosis not present

## 2015-08-12 DIAGNOSIS — I10 Essential (primary) hypertension: Secondary | ICD-10-CM | POA: Diagnosis not present

## 2015-08-12 DIAGNOSIS — M21372 Foot drop, left foot: Secondary | ICD-10-CM | POA: Diagnosis not present

## 2015-08-12 DIAGNOSIS — L97521 Non-pressure chronic ulcer of other part of left foot limited to breakdown of skin: Secondary | ICD-10-CM | POA: Diagnosis not present

## 2015-08-12 DIAGNOSIS — M7022 Olecranon bursitis, left elbow: Secondary | ICD-10-CM | POA: Diagnosis not present

## 2015-08-12 DIAGNOSIS — M79672 Pain in left foot: Secondary | ICD-10-CM | POA: Diagnosis not present

## 2015-08-12 DIAGNOSIS — M79671 Pain in right foot: Secondary | ICD-10-CM | POA: Diagnosis not present

## 2015-08-12 DIAGNOSIS — L03114 Cellulitis of left upper limb: Secondary | ICD-10-CM | POA: Diagnosis not present

## 2015-08-12 DIAGNOSIS — Z9181 History of falling: Secondary | ICD-10-CM | POA: Diagnosis not present

## 2015-08-12 DIAGNOSIS — M71121 Other infective bursitis, right elbow: Secondary | ICD-10-CM | POA: Diagnosis not present

## 2015-08-12 DIAGNOSIS — M549 Dorsalgia, unspecified: Secondary | ICD-10-CM | POA: Diagnosis not present

## 2015-08-12 DIAGNOSIS — M21371 Foot drop, right foot: Secondary | ICD-10-CM | POA: Diagnosis not present

## 2015-08-12 DIAGNOSIS — E782 Mixed hyperlipidemia: Secondary | ICD-10-CM | POA: Diagnosis not present

## 2015-08-12 DIAGNOSIS — H9209 Otalgia, unspecified ear: Secondary | ICD-10-CM | POA: Diagnosis not present

## 2015-08-12 DIAGNOSIS — G7000 Myasthenia gravis without (acute) exacerbation: Secondary | ICD-10-CM | POA: Diagnosis not present

## 2015-08-12 DIAGNOSIS — B9561 Methicillin susceptible Staphylococcus aureus infection as the cause of diseases classified elsewhere: Secondary | ICD-10-CM | POA: Diagnosis not present

## 2015-08-12 DIAGNOSIS — E785 Hyperlipidemia, unspecified: Secondary | ICD-10-CM | POA: Diagnosis not present

## 2015-08-12 DIAGNOSIS — R29898 Other symptoms and signs involving the musculoskeletal system: Secondary | ICD-10-CM | POA: Diagnosis not present

## 2015-08-12 DIAGNOSIS — G8929 Other chronic pain: Secondary | ICD-10-CM | POA: Diagnosis not present

## 2015-08-12 DIAGNOSIS — R296 Repeated falls: Secondary | ICD-10-CM | POA: Diagnosis not present

## 2015-08-12 DIAGNOSIS — L0390 Cellulitis, unspecified: Secondary | ICD-10-CM | POA: Diagnosis not present

## 2015-08-12 DIAGNOSIS — E039 Hypothyroidism, unspecified: Secondary | ICD-10-CM | POA: Diagnosis not present

## 2015-08-12 DIAGNOSIS — M792 Neuralgia and neuritis, unspecified: Secondary | ICD-10-CM | POA: Diagnosis not present

## 2015-08-12 DIAGNOSIS — M6281 Muscle weakness (generalized): Secondary | ICD-10-CM | POA: Diagnosis not present

## 2015-08-12 DIAGNOSIS — N289 Disorder of kidney and ureter, unspecified: Secondary | ICD-10-CM | POA: Diagnosis not present

## 2015-08-12 DIAGNOSIS — R52 Pain, unspecified: Secondary | ICD-10-CM | POA: Diagnosis not present

## 2015-08-12 DIAGNOSIS — L97511 Non-pressure chronic ulcer of other part of right foot limited to breakdown of skin: Secondary | ICD-10-CM | POA: Diagnosis not present

## 2015-08-12 DIAGNOSIS — I4891 Unspecified atrial fibrillation: Secondary | ICD-10-CM | POA: Diagnosis not present

## 2015-08-12 DIAGNOSIS — M25361 Other instability, right knee: Secondary | ICD-10-CM | POA: Diagnosis not present

## 2015-08-13 DIAGNOSIS — E782 Mixed hyperlipidemia: Secondary | ICD-10-CM | POA: Diagnosis not present

## 2015-08-13 DIAGNOSIS — E039 Hypothyroidism, unspecified: Secondary | ICD-10-CM | POA: Diagnosis not present

## 2015-08-13 DIAGNOSIS — G7000 Myasthenia gravis without (acute) exacerbation: Secondary | ICD-10-CM | POA: Diagnosis not present

## 2015-08-13 DIAGNOSIS — Z9181 History of falling: Secondary | ICD-10-CM | POA: Diagnosis not present

## 2015-08-14 DIAGNOSIS — I4891 Unspecified atrial fibrillation: Secondary | ICD-10-CM | POA: Diagnosis not present

## 2015-08-14 DIAGNOSIS — R29898 Other symptoms and signs involving the musculoskeletal system: Secondary | ICD-10-CM | POA: Diagnosis not present

## 2015-08-14 DIAGNOSIS — E039 Hypothyroidism, unspecified: Secondary | ICD-10-CM | POA: Diagnosis not present

## 2015-08-14 DIAGNOSIS — I10 Essential (primary) hypertension: Secondary | ICD-10-CM | POA: Diagnosis not present

## 2015-08-17 DIAGNOSIS — G894 Chronic pain syndrome: Secondary | ICD-10-CM | POA: Diagnosis not present

## 2015-08-17 DIAGNOSIS — N289 Disorder of kidney and ureter, unspecified: Secondary | ICD-10-CM | POA: Diagnosis not present

## 2015-08-17 DIAGNOSIS — E782 Mixed hyperlipidemia: Secondary | ICD-10-CM | POA: Diagnosis not present

## 2015-08-17 DIAGNOSIS — L03114 Cellulitis of left upper limb: Secondary | ICD-10-CM | POA: Diagnosis not present

## 2015-08-17 DIAGNOSIS — L97521 Non-pressure chronic ulcer of other part of left foot limited to breakdown of skin: Secondary | ICD-10-CM | POA: Diagnosis not present

## 2015-08-17 DIAGNOSIS — E039 Hypothyroidism, unspecified: Secondary | ICD-10-CM | POA: Diagnosis not present

## 2015-08-17 DIAGNOSIS — G7000 Myasthenia gravis without (acute) exacerbation: Secondary | ICD-10-CM | POA: Diagnosis not present

## 2015-08-17 DIAGNOSIS — M71121 Other infective bursitis, right elbow: Secondary | ICD-10-CM | POA: Diagnosis not present

## 2015-08-17 DIAGNOSIS — Z9181 History of falling: Secondary | ICD-10-CM | POA: Diagnosis not present

## 2015-08-17 DIAGNOSIS — L97511 Non-pressure chronic ulcer of other part of right foot limited to breakdown of skin: Secondary | ICD-10-CM | POA: Diagnosis not present

## 2015-08-17 DIAGNOSIS — B9561 Methicillin susceptible Staphylococcus aureus infection as the cause of diseases classified elsewhere: Secondary | ICD-10-CM | POA: Diagnosis not present

## 2015-08-18 DIAGNOSIS — I4891 Unspecified atrial fibrillation: Secondary | ICD-10-CM | POA: Diagnosis not present

## 2015-08-18 DIAGNOSIS — L0390 Cellulitis, unspecified: Secondary | ICD-10-CM | POA: Diagnosis not present

## 2015-08-18 DIAGNOSIS — R52 Pain, unspecified: Secondary | ICD-10-CM | POA: Diagnosis not present

## 2015-08-18 DIAGNOSIS — M7022 Olecranon bursitis, left elbow: Secondary | ICD-10-CM | POA: Diagnosis not present

## 2015-08-20 DIAGNOSIS — E782 Mixed hyperlipidemia: Secondary | ICD-10-CM | POA: Diagnosis not present

## 2015-08-20 DIAGNOSIS — N289 Disorder of kidney and ureter, unspecified: Secondary | ICD-10-CM | POA: Diagnosis not present

## 2015-08-20 DIAGNOSIS — G7000 Myasthenia gravis without (acute) exacerbation: Secondary | ICD-10-CM | POA: Diagnosis not present

## 2015-08-20 DIAGNOSIS — B9561 Methicillin susceptible Staphylococcus aureus infection as the cause of diseases classified elsewhere: Secondary | ICD-10-CM | POA: Diagnosis not present

## 2015-08-20 DIAGNOSIS — M7022 Olecranon bursitis, left elbow: Secondary | ICD-10-CM | POA: Diagnosis not present

## 2015-08-20 DIAGNOSIS — I4891 Unspecified atrial fibrillation: Secondary | ICD-10-CM | POA: Diagnosis not present

## 2015-08-20 DIAGNOSIS — G894 Chronic pain syndrome: Secondary | ICD-10-CM | POA: Diagnosis not present

## 2015-08-20 DIAGNOSIS — Z9181 History of falling: Secondary | ICD-10-CM | POA: Diagnosis not present

## 2015-08-20 DIAGNOSIS — L97511 Non-pressure chronic ulcer of other part of right foot limited to breakdown of skin: Secondary | ICD-10-CM | POA: Diagnosis not present

## 2015-08-20 DIAGNOSIS — L97521 Non-pressure chronic ulcer of other part of left foot limited to breakdown of skin: Secondary | ICD-10-CM | POA: Diagnosis not present

## 2015-08-20 DIAGNOSIS — M71121 Other infective bursitis, right elbow: Secondary | ICD-10-CM | POA: Diagnosis not present

## 2015-08-20 DIAGNOSIS — L03114 Cellulitis of left upper limb: Secondary | ICD-10-CM | POA: Diagnosis not present

## 2015-08-20 DIAGNOSIS — E039 Hypothyroidism, unspecified: Secondary | ICD-10-CM | POA: Diagnosis not present

## 2015-08-20 DIAGNOSIS — L0390 Cellulitis, unspecified: Secondary | ICD-10-CM | POA: Diagnosis not present

## 2015-08-20 DIAGNOSIS — R52 Pain, unspecified: Secondary | ICD-10-CM | POA: Diagnosis not present

## 2015-08-24 DIAGNOSIS — L03114 Cellulitis of left upper limb: Secondary | ICD-10-CM | POA: Diagnosis not present

## 2015-08-24 DIAGNOSIS — N289 Disorder of kidney and ureter, unspecified: Secondary | ICD-10-CM | POA: Diagnosis not present

## 2015-08-24 DIAGNOSIS — G7000 Myasthenia gravis without (acute) exacerbation: Secondary | ICD-10-CM | POA: Diagnosis not present

## 2015-08-24 DIAGNOSIS — L97521 Non-pressure chronic ulcer of other part of left foot limited to breakdown of skin: Secondary | ICD-10-CM | POA: Diagnosis not present

## 2015-08-24 DIAGNOSIS — B9561 Methicillin susceptible Staphylococcus aureus infection as the cause of diseases classified elsewhere: Secondary | ICD-10-CM | POA: Diagnosis not present

## 2015-08-24 DIAGNOSIS — M71121 Other infective bursitis, right elbow: Secondary | ICD-10-CM | POA: Diagnosis not present

## 2015-08-24 DIAGNOSIS — G894 Chronic pain syndrome: Secondary | ICD-10-CM | POA: Diagnosis not present

## 2015-08-24 DIAGNOSIS — L97511 Non-pressure chronic ulcer of other part of right foot limited to breakdown of skin: Secondary | ICD-10-CM | POA: Diagnosis not present

## 2015-08-25 DIAGNOSIS — B9561 Methicillin susceptible Staphylococcus aureus infection as the cause of diseases classified elsewhere: Secondary | ICD-10-CM | POA: Diagnosis not present

## 2015-08-25 DIAGNOSIS — L03114 Cellulitis of left upper limb: Secondary | ICD-10-CM | POA: Diagnosis not present

## 2015-08-25 DIAGNOSIS — L97511 Non-pressure chronic ulcer of other part of right foot limited to breakdown of skin: Secondary | ICD-10-CM | POA: Diagnosis not present

## 2015-08-25 DIAGNOSIS — G7000 Myasthenia gravis without (acute) exacerbation: Secondary | ICD-10-CM | POA: Diagnosis not present

## 2015-08-25 DIAGNOSIS — M71121 Other infective bursitis, right elbow: Secondary | ICD-10-CM | POA: Diagnosis not present

## 2015-08-25 DIAGNOSIS — G894 Chronic pain syndrome: Secondary | ICD-10-CM | POA: Diagnosis not present

## 2015-08-25 DIAGNOSIS — N289 Disorder of kidney and ureter, unspecified: Secondary | ICD-10-CM | POA: Diagnosis not present

## 2015-08-25 DIAGNOSIS — L97521 Non-pressure chronic ulcer of other part of left foot limited to breakdown of skin: Secondary | ICD-10-CM | POA: Diagnosis not present

## 2015-08-27 DIAGNOSIS — Z9181 History of falling: Secondary | ICD-10-CM | POA: Diagnosis not present

## 2015-08-27 DIAGNOSIS — E782 Mixed hyperlipidemia: Secondary | ICD-10-CM | POA: Diagnosis not present

## 2015-08-27 DIAGNOSIS — E039 Hypothyroidism, unspecified: Secondary | ICD-10-CM | POA: Diagnosis not present

## 2015-08-27 DIAGNOSIS — G7000 Myasthenia gravis without (acute) exacerbation: Secondary | ICD-10-CM | POA: Diagnosis not present

## 2015-08-28 DIAGNOSIS — H9209 Otalgia, unspecified ear: Secondary | ICD-10-CM | POA: Diagnosis not present

## 2015-08-28 DIAGNOSIS — I4891 Unspecified atrial fibrillation: Secondary | ICD-10-CM | POA: Diagnosis not present

## 2015-08-28 DIAGNOSIS — M7022 Olecranon bursitis, left elbow: Secondary | ICD-10-CM | POA: Diagnosis not present

## 2015-08-28 DIAGNOSIS — R52 Pain, unspecified: Secondary | ICD-10-CM | POA: Diagnosis not present

## 2015-08-28 DIAGNOSIS — L0390 Cellulitis, unspecified: Secondary | ICD-10-CM | POA: Diagnosis not present

## 2015-08-28 DIAGNOSIS — I10 Essential (primary) hypertension: Secondary | ICD-10-CM | POA: Diagnosis not present

## 2015-08-28 DIAGNOSIS — E785 Hyperlipidemia, unspecified: Secondary | ICD-10-CM | POA: Diagnosis not present

## 2015-08-28 DIAGNOSIS — R29898 Other symptoms and signs involving the musculoskeletal system: Secondary | ICD-10-CM | POA: Diagnosis not present

## 2015-08-29 HISTORY — PX: ELBOW SURGERY: SHX618

## 2015-08-30 DIAGNOSIS — G7001 Myasthenia gravis with (acute) exacerbation: Secondary | ICD-10-CM | POA: Diagnosis not present

## 2015-08-30 DIAGNOSIS — I4891 Unspecified atrial fibrillation: Secondary | ICD-10-CM | POA: Diagnosis not present

## 2015-08-31 DIAGNOSIS — G7001 Myasthenia gravis with (acute) exacerbation: Secondary | ICD-10-CM | POA: Diagnosis not present

## 2015-08-31 DIAGNOSIS — I4891 Unspecified atrial fibrillation: Secondary | ICD-10-CM | POA: Diagnosis not present

## 2015-09-01 DIAGNOSIS — G7001 Myasthenia gravis with (acute) exacerbation: Secondary | ICD-10-CM | POA: Diagnosis not present

## 2015-09-01 DIAGNOSIS — I4891 Unspecified atrial fibrillation: Secondary | ICD-10-CM | POA: Diagnosis not present

## 2015-09-01 DIAGNOSIS — L97921 Non-pressure chronic ulcer of unspecified part of left lower leg limited to breakdown of skin: Secondary | ICD-10-CM | POA: Diagnosis not present

## 2015-09-01 DIAGNOSIS — R509 Fever, unspecified: Secondary | ICD-10-CM | POA: Diagnosis not present

## 2015-09-01 DIAGNOSIS — G7000 Myasthenia gravis without (acute) exacerbation: Secondary | ICD-10-CM | POA: Diagnosis not present

## 2015-09-01 DIAGNOSIS — R05 Cough: Secondary | ICD-10-CM | POA: Diagnosis not present

## 2015-09-01 DIAGNOSIS — W1830XA Fall on same level, unspecified, initial encounter: Secondary | ICD-10-CM | POA: Diagnosis not present

## 2015-09-02 DIAGNOSIS — G7001 Myasthenia gravis with (acute) exacerbation: Secondary | ICD-10-CM | POA: Diagnosis not present

## 2015-09-02 DIAGNOSIS — I4891 Unspecified atrial fibrillation: Secondary | ICD-10-CM | POA: Diagnosis not present

## 2015-09-03 DIAGNOSIS — I4891 Unspecified atrial fibrillation: Secondary | ICD-10-CM | POA: Diagnosis not present

## 2015-09-03 DIAGNOSIS — G7001 Myasthenia gravis with (acute) exacerbation: Secondary | ICD-10-CM | POA: Diagnosis not present

## 2015-09-04 DIAGNOSIS — I4891 Unspecified atrial fibrillation: Secondary | ICD-10-CM | POA: Diagnosis not present

## 2015-09-04 DIAGNOSIS — G7001 Myasthenia gravis with (acute) exacerbation: Secondary | ICD-10-CM | POA: Diagnosis not present

## 2015-09-05 DIAGNOSIS — R69 Illness, unspecified: Secondary | ICD-10-CM | POA: Diagnosis not present

## 2015-09-05 DIAGNOSIS — G7001 Myasthenia gravis with (acute) exacerbation: Secondary | ICD-10-CM | POA: Diagnosis not present

## 2015-09-05 DIAGNOSIS — I4891 Unspecified atrial fibrillation: Secondary | ICD-10-CM | POA: Diagnosis not present

## 2015-09-06 DIAGNOSIS — I4891 Unspecified atrial fibrillation: Secondary | ICD-10-CM | POA: Diagnosis not present

## 2015-09-06 DIAGNOSIS — G7001 Myasthenia gravis with (acute) exacerbation: Secondary | ICD-10-CM | POA: Diagnosis not present

## 2015-09-07 DIAGNOSIS — G7001 Myasthenia gravis with (acute) exacerbation: Secondary | ICD-10-CM | POA: Diagnosis not present

## 2015-09-07 DIAGNOSIS — I4891 Unspecified atrial fibrillation: Secondary | ICD-10-CM | POA: Diagnosis not present

## 2015-09-08 DIAGNOSIS — I4891 Unspecified atrial fibrillation: Secondary | ICD-10-CM | POA: Diagnosis not present

## 2015-09-08 DIAGNOSIS — G7001 Myasthenia gravis with (acute) exacerbation: Secondary | ICD-10-CM | POA: Diagnosis not present

## 2015-09-09 DIAGNOSIS — G7001 Myasthenia gravis with (acute) exacerbation: Secondary | ICD-10-CM | POA: Diagnosis not present

## 2015-09-09 DIAGNOSIS — I4891 Unspecified atrial fibrillation: Secondary | ICD-10-CM | POA: Diagnosis not present

## 2015-09-10 DIAGNOSIS — I4891 Unspecified atrial fibrillation: Secondary | ICD-10-CM | POA: Diagnosis not present

## 2015-09-10 DIAGNOSIS — G7001 Myasthenia gravis with (acute) exacerbation: Secondary | ICD-10-CM | POA: Diagnosis not present

## 2015-09-11 DIAGNOSIS — G7001 Myasthenia gravis with (acute) exacerbation: Secondary | ICD-10-CM | POA: Diagnosis not present

## 2015-09-11 DIAGNOSIS — I4891 Unspecified atrial fibrillation: Secondary | ICD-10-CM | POA: Diagnosis not present

## 2015-09-12 DIAGNOSIS — I4891 Unspecified atrial fibrillation: Secondary | ICD-10-CM | POA: Diagnosis not present

## 2015-09-12 DIAGNOSIS — G7001 Myasthenia gravis with (acute) exacerbation: Secondary | ICD-10-CM | POA: Diagnosis not present

## 2015-09-13 DIAGNOSIS — I4891 Unspecified atrial fibrillation: Secondary | ICD-10-CM | POA: Diagnosis not present

## 2015-09-13 DIAGNOSIS — G7001 Myasthenia gravis with (acute) exacerbation: Secondary | ICD-10-CM | POA: Diagnosis not present

## 2015-09-14 DIAGNOSIS — G7001 Myasthenia gravis with (acute) exacerbation: Secondary | ICD-10-CM | POA: Diagnosis not present

## 2015-09-14 DIAGNOSIS — I4891 Unspecified atrial fibrillation: Secondary | ICD-10-CM | POA: Diagnosis not present

## 2015-09-15 DIAGNOSIS — I4891 Unspecified atrial fibrillation: Secondary | ICD-10-CM | POA: Diagnosis not present

## 2015-09-15 DIAGNOSIS — G7001 Myasthenia gravis with (acute) exacerbation: Secondary | ICD-10-CM | POA: Diagnosis not present

## 2015-09-16 DIAGNOSIS — I4891 Unspecified atrial fibrillation: Secondary | ICD-10-CM | POA: Diagnosis not present

## 2015-09-16 DIAGNOSIS — G7001 Myasthenia gravis with (acute) exacerbation: Secondary | ICD-10-CM | POA: Diagnosis not present

## 2015-09-17 DIAGNOSIS — I4891 Unspecified atrial fibrillation: Secondary | ICD-10-CM | POA: Diagnosis not present

## 2015-09-17 DIAGNOSIS — G7001 Myasthenia gravis with (acute) exacerbation: Secondary | ICD-10-CM | POA: Diagnosis not present

## 2015-09-18 DIAGNOSIS — I4891 Unspecified atrial fibrillation: Secondary | ICD-10-CM | POA: Diagnosis not present

## 2015-09-18 DIAGNOSIS — M71121 Other infective bursitis, right elbow: Secondary | ICD-10-CM | POA: Diagnosis not present

## 2015-09-18 DIAGNOSIS — L89892 Pressure ulcer of other site, stage 2: Secondary | ICD-10-CM | POA: Diagnosis not present

## 2015-09-18 DIAGNOSIS — L03114 Cellulitis of left upper limb: Secondary | ICD-10-CM | POA: Diagnosis not present

## 2015-09-18 DIAGNOSIS — G7000 Myasthenia gravis without (acute) exacerbation: Secondary | ICD-10-CM | POA: Diagnosis not present

## 2015-09-18 DIAGNOSIS — T148 Other injury of unspecified body region: Secondary | ICD-10-CM | POA: Diagnosis not present

## 2015-09-18 DIAGNOSIS — G7001 Myasthenia gravis with (acute) exacerbation: Secondary | ICD-10-CM | POA: Diagnosis not present

## 2015-09-18 DIAGNOSIS — N289 Disorder of kidney and ureter, unspecified: Secondary | ICD-10-CM | POA: Diagnosis not present

## 2015-09-19 DIAGNOSIS — G7001 Myasthenia gravis with (acute) exacerbation: Secondary | ICD-10-CM | POA: Diagnosis not present

## 2015-09-19 DIAGNOSIS — I4891 Unspecified atrial fibrillation: Secondary | ICD-10-CM | POA: Diagnosis not present

## 2015-09-20 DIAGNOSIS — G7001 Myasthenia gravis with (acute) exacerbation: Secondary | ICD-10-CM | POA: Diagnosis not present

## 2015-09-20 DIAGNOSIS — I4891 Unspecified atrial fibrillation: Secondary | ICD-10-CM | POA: Diagnosis not present

## 2015-09-22 DIAGNOSIS — G7001 Myasthenia gravis with (acute) exacerbation: Secondary | ICD-10-CM | POA: Diagnosis not present

## 2015-09-22 DIAGNOSIS — I4891 Unspecified atrial fibrillation: Secondary | ICD-10-CM | POA: Diagnosis not present

## 2015-09-23 DIAGNOSIS — G7001 Myasthenia gravis with (acute) exacerbation: Secondary | ICD-10-CM | POA: Diagnosis not present

## 2015-09-23 DIAGNOSIS — I4891 Unspecified atrial fibrillation: Secondary | ICD-10-CM | POA: Diagnosis not present

## 2015-09-24 DIAGNOSIS — G7001 Myasthenia gravis with (acute) exacerbation: Secondary | ICD-10-CM | POA: Diagnosis not present

## 2015-09-24 DIAGNOSIS — I4891 Unspecified atrial fibrillation: Secondary | ICD-10-CM | POA: Diagnosis not present

## 2015-09-25 DIAGNOSIS — G7001 Myasthenia gravis with (acute) exacerbation: Secondary | ICD-10-CM | POA: Diagnosis not present

## 2015-09-25 DIAGNOSIS — I4891 Unspecified atrial fibrillation: Secondary | ICD-10-CM | POA: Diagnosis not present

## 2015-09-28 DIAGNOSIS — I87331 Chronic venous hypertension (idiopathic) with ulcer and inflammation of right lower extremity: Secondary | ICD-10-CM | POA: Diagnosis not present

## 2015-09-28 DIAGNOSIS — L03114 Cellulitis of left upper limb: Secondary | ICD-10-CM | POA: Diagnosis not present

## 2015-09-28 DIAGNOSIS — L89892 Pressure ulcer of other site, stage 2: Secondary | ICD-10-CM | POA: Diagnosis not present

## 2015-09-28 DIAGNOSIS — S81801A Unspecified open wound, right lower leg, initial encounter: Secondary | ICD-10-CM | POA: Diagnosis not present

## 2015-09-29 DIAGNOSIS — I4891 Unspecified atrial fibrillation: Secondary | ICD-10-CM | POA: Diagnosis not present

## 2015-09-29 DIAGNOSIS — G7001 Myasthenia gravis with (acute) exacerbation: Secondary | ICD-10-CM | POA: Diagnosis not present

## 2015-10-02 DIAGNOSIS — G7001 Myasthenia gravis with (acute) exacerbation: Secondary | ICD-10-CM | POA: Diagnosis not present

## 2015-10-02 DIAGNOSIS — I4891 Unspecified atrial fibrillation: Secondary | ICD-10-CM | POA: Diagnosis not present

## 2015-10-03 DIAGNOSIS — B351 Tinea unguium: Secondary | ICD-10-CM | POA: Diagnosis not present

## 2015-10-03 DIAGNOSIS — L97521 Non-pressure chronic ulcer of other part of left foot limited to breakdown of skin: Secondary | ICD-10-CM | POA: Diagnosis not present

## 2015-10-03 DIAGNOSIS — L03116 Cellulitis of left lower limb: Secondary | ICD-10-CM | POA: Diagnosis not present

## 2015-10-03 DIAGNOSIS — M79671 Pain in right foot: Secondary | ICD-10-CM | POA: Diagnosis not present

## 2015-10-03 DIAGNOSIS — M79672 Pain in left foot: Secondary | ICD-10-CM | POA: Diagnosis not present

## 2015-10-06 DIAGNOSIS — M7732 Calcaneal spur, left foot: Secondary | ICD-10-CM | POA: Diagnosis not present

## 2015-10-06 DIAGNOSIS — M25872 Other specified joint disorders, left ankle and foot: Secondary | ICD-10-CM | POA: Diagnosis not present

## 2015-10-06 DIAGNOSIS — M2142 Flat foot [pes planus] (acquired), left foot: Secondary | ICD-10-CM | POA: Diagnosis not present

## 2015-10-15 DIAGNOSIS — M25872 Other specified joint disorders, left ankle and foot: Secondary | ICD-10-CM | POA: Diagnosis not present

## 2015-11-12 DIAGNOSIS — G7000 Myasthenia gravis without (acute) exacerbation: Secondary | ICD-10-CM | POA: Diagnosis not present

## 2016-01-18 DIAGNOSIS — E039 Hypothyroidism, unspecified: Secondary | ICD-10-CM | POA: Diagnosis not present

## 2016-01-18 DIAGNOSIS — K219 Gastro-esophageal reflux disease without esophagitis: Secondary | ICD-10-CM | POA: Diagnosis not present

## 2016-01-18 DIAGNOSIS — I10 Essential (primary) hypertension: Secondary | ICD-10-CM | POA: Diagnosis not present

## 2016-01-18 DIAGNOSIS — N289 Disorder of kidney and ureter, unspecified: Secondary | ICD-10-CM | POA: Diagnosis not present

## 2016-05-28 DIAGNOSIS — H401132 Primary open-angle glaucoma, bilateral, moderate stage: Secondary | ICD-10-CM | POA: Diagnosis not present

## 2016-05-28 DIAGNOSIS — H2511 Age-related nuclear cataract, right eye: Secondary | ICD-10-CM | POA: Diagnosis not present

## 2016-05-28 DIAGNOSIS — H04123 Dry eye syndrome of bilateral lacrimal glands: Secondary | ICD-10-CM | POA: Diagnosis not present

## 2016-05-28 DIAGNOSIS — Z961 Presence of intraocular lens: Secondary | ICD-10-CM | POA: Diagnosis not present

## 2016-07-29 DIAGNOSIS — E781 Pure hyperglyceridemia: Secondary | ICD-10-CM | POA: Diagnosis not present

## 2016-07-29 DIAGNOSIS — E782 Mixed hyperlipidemia: Secondary | ICD-10-CM | POA: Diagnosis not present

## 2016-07-29 DIAGNOSIS — I10 Essential (primary) hypertension: Secondary | ICD-10-CM | POA: Diagnosis not present

## 2016-07-29 DIAGNOSIS — I482 Chronic atrial fibrillation: Secondary | ICD-10-CM | POA: Diagnosis not present

## 2016-08-04 DIAGNOSIS — L97911 Non-pressure chronic ulcer of unspecified part of right lower leg limited to breakdown of skin: Secondary | ICD-10-CM | POA: Diagnosis not present

## 2016-08-04 DIAGNOSIS — L97921 Non-pressure chronic ulcer of unspecified part of left lower leg limited to breakdown of skin: Secondary | ICD-10-CM | POA: Diagnosis not present

## 2016-08-04 DIAGNOSIS — I87331 Chronic venous hypertension (idiopathic) with ulcer and inflammation of right lower extremity: Secondary | ICD-10-CM | POA: Diagnosis not present

## 2016-08-04 DIAGNOSIS — L03115 Cellulitis of right lower limb: Secondary | ICD-10-CM | POA: Diagnosis not present

## 2016-08-05 DIAGNOSIS — I10 Essential (primary) hypertension: Secondary | ICD-10-CM | POA: Diagnosis not present

## 2016-08-05 DIAGNOSIS — R531 Weakness: Secondary | ICD-10-CM | POA: Diagnosis not present

## 2016-08-05 DIAGNOSIS — E039 Hypothyroidism, unspecified: Secondary | ICD-10-CM | POA: Diagnosis not present

## 2016-08-05 DIAGNOSIS — R2681 Unsteadiness on feet: Secondary | ICD-10-CM | POA: Diagnosis not present

## 2016-08-08 DIAGNOSIS — I87331 Chronic venous hypertension (idiopathic) with ulcer and inflammation of right lower extremity: Secondary | ICD-10-CM | POA: Diagnosis not present

## 2016-08-08 DIAGNOSIS — L97921 Non-pressure chronic ulcer of unspecified part of left lower leg limited to breakdown of skin: Secondary | ICD-10-CM | POA: Diagnosis not present

## 2016-08-31 DIAGNOSIS — R509 Fever, unspecified: Secondary | ICD-10-CM | POA: Diagnosis not present

## 2016-08-31 DIAGNOSIS — E860 Dehydration: Secondary | ICD-10-CM | POA: Diagnosis not present

## 2016-08-31 DIAGNOSIS — S72401A Unspecified fracture of lower end of right femur, initial encounter for closed fracture: Secondary | ICD-10-CM | POA: Diagnosis not present

## 2016-08-31 DIAGNOSIS — D649 Anemia, unspecified: Secondary | ICD-10-CM | POA: Diagnosis not present

## 2016-08-31 DIAGNOSIS — E785 Hyperlipidemia, unspecified: Secondary | ICD-10-CM | POA: Diagnosis not present

## 2016-08-31 DIAGNOSIS — W010XXA Fall on same level from slipping, tripping and stumbling without subsequent striking against object, initial encounter: Secondary | ICD-10-CM | POA: Diagnosis not present

## 2016-08-31 DIAGNOSIS — Z96653 Presence of artificial knee joint, bilateral: Secondary | ICD-10-CM | POA: Diagnosis not present

## 2016-08-31 DIAGNOSIS — R269 Unspecified abnormalities of gait and mobility: Secondary | ICD-10-CM | POA: Diagnosis not present

## 2016-08-31 DIAGNOSIS — Z96659 Presence of unspecified artificial knee joint: Secondary | ICD-10-CM | POA: Diagnosis not present

## 2016-08-31 DIAGNOSIS — M25561 Pain in right knee: Secondary | ICD-10-CM | POA: Diagnosis not present

## 2016-08-31 DIAGNOSIS — R918 Other nonspecific abnormal finding of lung field: Secondary | ICD-10-CM | POA: Diagnosis not present

## 2016-08-31 DIAGNOSIS — W1830XA Fall on same level, unspecified, initial encounter: Secondary | ICD-10-CM | POA: Diagnosis not present

## 2016-08-31 DIAGNOSIS — I10 Essential (primary) hypertension: Secondary | ICD-10-CM | POA: Diagnosis not present

## 2016-08-31 DIAGNOSIS — S7011XA Contusion of right thigh, initial encounter: Secondary | ICD-10-CM | POA: Diagnosis not present

## 2016-08-31 DIAGNOSIS — Z743 Need for continuous supervision: Secondary | ICD-10-CM | POA: Diagnosis not present

## 2016-08-31 DIAGNOSIS — J189 Pneumonia, unspecified organism: Secondary | ICD-10-CM | POA: Diagnosis not present

## 2016-08-31 DIAGNOSIS — M6281 Muscle weakness (generalized): Secondary | ICD-10-CM | POA: Diagnosis not present

## 2016-08-31 DIAGNOSIS — S8012XA Contusion of left lower leg, initial encounter: Secondary | ICD-10-CM | POA: Diagnosis not present

## 2016-08-31 DIAGNOSIS — I472 Ventricular tachycardia: Secondary | ICD-10-CM | POA: Diagnosis not present

## 2016-08-31 DIAGNOSIS — M9711XA Periprosthetic fracture around internal prosthetic right knee joint, initial encounter: Secondary | ICD-10-CM | POA: Diagnosis not present

## 2016-08-31 DIAGNOSIS — E039 Hypothyroidism, unspecified: Secondary | ICD-10-CM | POA: Diagnosis not present

## 2016-08-31 DIAGNOSIS — M8580 Other specified disorders of bone density and structure, unspecified site: Secondary | ICD-10-CM | POA: Diagnosis not present

## 2016-08-31 DIAGNOSIS — R262 Difficulty in walking, not elsewhere classified: Secondary | ICD-10-CM | POA: Diagnosis not present

## 2016-08-31 DIAGNOSIS — S72009A Fracture of unspecified part of neck of unspecified femur, initial encounter for closed fracture: Secondary | ICD-10-CM | POA: Diagnosis not present

## 2016-08-31 DIAGNOSIS — S7291XK Unspecified fracture of right femur, subsequent encounter for closed fracture with nonunion: Secondary | ICD-10-CM | POA: Diagnosis not present

## 2016-08-31 DIAGNOSIS — R0602 Shortness of breath: Secondary | ICD-10-CM | POA: Diagnosis not present

## 2016-08-31 DIAGNOSIS — Z7401 Bed confinement status: Secondary | ICD-10-CM | POA: Diagnosis not present

## 2016-08-31 DIAGNOSIS — M79604 Pain in right leg: Secondary | ICD-10-CM | POA: Diagnosis not present

## 2016-08-31 DIAGNOSIS — S72001A Fracture of unspecified part of neck of right femur, initial encounter for closed fracture: Secondary | ICD-10-CM | POA: Diagnosis not present

## 2016-08-31 DIAGNOSIS — R202 Paresthesia of skin: Secondary | ICD-10-CM | POA: Diagnosis not present

## 2016-08-31 DIAGNOSIS — R296 Repeated falls: Secondary | ICD-10-CM | POA: Diagnosis not present

## 2016-08-31 DIAGNOSIS — I4891 Unspecified atrial fibrillation: Secondary | ICD-10-CM | POA: Diagnosis not present

## 2016-08-31 DIAGNOSIS — M9711XD Periprosthetic fracture around internal prosthetic right knee joint, subsequent encounter: Secondary | ICD-10-CM | POA: Diagnosis not present

## 2016-08-31 DIAGNOSIS — M4802 Spinal stenosis, cervical region: Secondary | ICD-10-CM | POA: Diagnosis not present

## 2016-08-31 DIAGNOSIS — G7000 Myasthenia gravis without (acute) exacerbation: Secondary | ICD-10-CM | POA: Diagnosis not present

## 2016-08-31 DIAGNOSIS — N179 Acute kidney failure, unspecified: Secondary | ICD-10-CM | POA: Diagnosis not present

## 2016-08-31 DIAGNOSIS — D62 Acute posthemorrhagic anemia: Secondary | ICD-10-CM | POA: Diagnosis not present

## 2016-08-31 DIAGNOSIS — E441 Mild protein-calorie malnutrition: Secondary | ICD-10-CM | POA: Diagnosis not present

## 2016-08-31 DIAGNOSIS — R05 Cough: Secondary | ICD-10-CM | POA: Diagnosis not present

## 2016-08-31 DIAGNOSIS — J45909 Unspecified asthma, uncomplicated: Secondary | ICD-10-CM | POA: Diagnosis not present

## 2016-08-31 DIAGNOSIS — M978XXA Periprosthetic fracture around other internal prosthetic joint, initial encounter: Secondary | ICD-10-CM | POA: Diagnosis not present

## 2016-08-31 DIAGNOSIS — M4800 Spinal stenosis, site unspecified: Secondary | ICD-10-CM | POA: Diagnosis not present

## 2016-09-13 DIAGNOSIS — I10 Essential (primary) hypertension: Secondary | ICD-10-CM | POA: Diagnosis not present

## 2016-09-13 DIAGNOSIS — G7000 Myasthenia gravis without (acute) exacerbation: Secondary | ICD-10-CM | POA: Diagnosis not present

## 2016-09-13 DIAGNOSIS — M79651 Pain in right thigh: Secondary | ICD-10-CM | POA: Diagnosis not present

## 2016-09-13 DIAGNOSIS — M25561 Pain in right knee: Secondary | ICD-10-CM | POA: Diagnosis not present

## 2016-09-13 DIAGNOSIS — E782 Mixed hyperlipidemia: Secondary | ICD-10-CM | POA: Diagnosis not present

## 2016-09-13 DIAGNOSIS — R296 Repeated falls: Secondary | ICD-10-CM | POA: Diagnosis not present

## 2016-09-13 DIAGNOSIS — R509 Fever, unspecified: Secondary | ICD-10-CM | POA: Diagnosis not present

## 2016-09-13 DIAGNOSIS — L97921 Non-pressure chronic ulcer of unspecified part of left lower leg limited to breakdown of skin: Secondary | ICD-10-CM | POA: Diagnosis not present

## 2016-09-13 DIAGNOSIS — G8929 Other chronic pain: Secondary | ICD-10-CM | POA: Diagnosis not present

## 2016-09-13 DIAGNOSIS — E785 Hyperlipidemia, unspecified: Secondary | ICD-10-CM | POA: Diagnosis not present

## 2016-09-13 DIAGNOSIS — I4891 Unspecified atrial fibrillation: Secondary | ICD-10-CM | POA: Diagnosis not present

## 2016-09-13 DIAGNOSIS — E039 Hypothyroidism, unspecified: Secondary | ICD-10-CM | POA: Diagnosis not present

## 2016-09-13 DIAGNOSIS — J189 Pneumonia, unspecified organism: Secondary | ICD-10-CM | POA: Diagnosis not present

## 2016-09-13 DIAGNOSIS — R202 Paresthesia of skin: Secondary | ICD-10-CM | POA: Diagnosis not present

## 2016-09-13 DIAGNOSIS — Z9181 History of falling: Secondary | ICD-10-CM | POA: Diagnosis not present

## 2016-09-13 DIAGNOSIS — Z7401 Bed confinement status: Secondary | ICD-10-CM | POA: Diagnosis not present

## 2016-09-13 DIAGNOSIS — R05 Cough: Secondary | ICD-10-CM | POA: Diagnosis not present

## 2016-09-13 DIAGNOSIS — M961 Postlaminectomy syndrome, not elsewhere classified: Secondary | ICD-10-CM | POA: Diagnosis not present

## 2016-09-13 DIAGNOSIS — D649 Anemia, unspecified: Secondary | ICD-10-CM | POA: Diagnosis not present

## 2016-09-13 DIAGNOSIS — S7291XK Unspecified fracture of right femur, subsequent encounter for closed fracture with nonunion: Secondary | ICD-10-CM | POA: Diagnosis not present

## 2016-09-13 DIAGNOSIS — M79672 Pain in left foot: Secondary | ICD-10-CM | POA: Diagnosis not present

## 2016-09-13 DIAGNOSIS — R269 Unspecified abnormalities of gait and mobility: Secondary | ICD-10-CM | POA: Diagnosis not present

## 2016-09-13 DIAGNOSIS — M6281 Muscle weakness (generalized): Secondary | ICD-10-CM | POA: Diagnosis not present

## 2016-09-13 DIAGNOSIS — N179 Acute kidney failure, unspecified: Secondary | ICD-10-CM | POA: Diagnosis not present

## 2016-09-13 DIAGNOSIS — J45909 Unspecified asthma, uncomplicated: Secondary | ICD-10-CM | POA: Diagnosis not present

## 2016-09-13 DIAGNOSIS — Z96651 Presence of right artificial knee joint: Secondary | ICD-10-CM | POA: Diagnosis not present

## 2016-09-13 DIAGNOSIS — R262 Difficulty in walking, not elsewhere classified: Secondary | ICD-10-CM | POA: Diagnosis not present

## 2016-09-13 DIAGNOSIS — W1830XA Fall on same level, unspecified, initial encounter: Secondary | ICD-10-CM | POA: Diagnosis not present

## 2016-09-13 DIAGNOSIS — M9711XD Periprosthetic fracture around internal prosthetic right knee joint, subsequent encounter: Secondary | ICD-10-CM | POA: Diagnosis not present

## 2016-09-14 DIAGNOSIS — J45909 Unspecified asthma, uncomplicated: Secondary | ICD-10-CM | POA: Diagnosis not present

## 2016-09-14 DIAGNOSIS — Z9181 History of falling: Secondary | ICD-10-CM | POA: Diagnosis not present

## 2016-09-14 DIAGNOSIS — M79651 Pain in right thigh: Secondary | ICD-10-CM | POA: Diagnosis not present

## 2016-09-14 DIAGNOSIS — R05 Cough: Secondary | ICD-10-CM | POA: Diagnosis not present

## 2016-09-14 DIAGNOSIS — I10 Essential (primary) hypertension: Secondary | ICD-10-CM | POA: Diagnosis not present

## 2016-09-14 DIAGNOSIS — G7000 Myasthenia gravis without (acute) exacerbation: Secondary | ICD-10-CM | POA: Diagnosis not present

## 2016-09-14 DIAGNOSIS — D320 Benign neoplasm of cerebral meninges: Secondary | ICD-10-CM | POA: Diagnosis not present

## 2016-09-14 DIAGNOSIS — M961 Postlaminectomy syndrome, not elsewhere classified: Secondary | ICD-10-CM | POA: Diagnosis not present

## 2016-09-14 DIAGNOSIS — J189 Pneumonia, unspecified organism: Secondary | ICD-10-CM | POA: Diagnosis not present

## 2016-09-14 DIAGNOSIS — R262 Difficulty in walking, not elsewhere classified: Secondary | ICD-10-CM | POA: Diagnosis not present

## 2016-09-14 DIAGNOSIS — F5105 Insomnia due to other mental disorder: Secondary | ICD-10-CM | POA: Diagnosis not present

## 2016-09-14 DIAGNOSIS — M9711XD Periprosthetic fracture around internal prosthetic right knee joint, subsequent encounter: Secondary | ICD-10-CM | POA: Diagnosis not present

## 2016-09-14 DIAGNOSIS — L97921 Non-pressure chronic ulcer of unspecified part of left lower leg limited to breakdown of skin: Secondary | ICD-10-CM | POA: Diagnosis not present

## 2016-09-14 DIAGNOSIS — E039 Hypothyroidism, unspecified: Secondary | ICD-10-CM | POA: Diagnosis not present

## 2016-09-14 DIAGNOSIS — G8929 Other chronic pain: Secondary | ICD-10-CM | POA: Diagnosis not present

## 2016-09-14 DIAGNOSIS — Z96651 Presence of right artificial knee joint: Secondary | ICD-10-CM | POA: Diagnosis not present

## 2016-09-14 DIAGNOSIS — I4891 Unspecified atrial fibrillation: Secondary | ICD-10-CM | POA: Diagnosis not present

## 2016-09-14 DIAGNOSIS — M79672 Pain in left foot: Secondary | ICD-10-CM | POA: Diagnosis not present

## 2016-09-14 DIAGNOSIS — M25561 Pain in right knee: Secondary | ICD-10-CM | POA: Diagnosis not present

## 2016-09-14 DIAGNOSIS — R296 Repeated falls: Secondary | ICD-10-CM | POA: Diagnosis not present

## 2016-09-14 DIAGNOSIS — R69 Illness, unspecified: Secondary | ICD-10-CM | POA: Diagnosis not present

## 2016-09-14 DIAGNOSIS — M6281 Muscle weakness (generalized): Secondary | ICD-10-CM | POA: Diagnosis not present

## 2016-09-14 DIAGNOSIS — E782 Mixed hyperlipidemia: Secondary | ICD-10-CM | POA: Diagnosis not present

## 2016-09-15 DIAGNOSIS — G7000 Myasthenia gravis without (acute) exacerbation: Secondary | ICD-10-CM | POA: Diagnosis not present

## 2016-09-15 DIAGNOSIS — R262 Difficulty in walking, not elsewhere classified: Secondary | ICD-10-CM | POA: Diagnosis not present

## 2016-09-15 DIAGNOSIS — M6281 Muscle weakness (generalized): Secondary | ICD-10-CM | POA: Diagnosis not present

## 2016-09-15 DIAGNOSIS — E039 Hypothyroidism, unspecified: Secondary | ICD-10-CM | POA: Diagnosis not present

## 2016-09-15 DIAGNOSIS — E782 Mixed hyperlipidemia: Secondary | ICD-10-CM | POA: Diagnosis not present

## 2016-09-15 DIAGNOSIS — M79672 Pain in left foot: Secondary | ICD-10-CM | POA: Diagnosis not present

## 2016-09-15 DIAGNOSIS — Z9181 History of falling: Secondary | ICD-10-CM | POA: Diagnosis not present

## 2016-09-16 DIAGNOSIS — M961 Postlaminectomy syndrome, not elsewhere classified: Secondary | ICD-10-CM | POA: Diagnosis not present

## 2016-09-16 DIAGNOSIS — I10 Essential (primary) hypertension: Secondary | ICD-10-CM | POA: Diagnosis not present

## 2016-09-16 DIAGNOSIS — G8929 Other chronic pain: Secondary | ICD-10-CM | POA: Diagnosis not present

## 2016-09-16 DIAGNOSIS — J189 Pneumonia, unspecified organism: Secondary | ICD-10-CM | POA: Diagnosis not present

## 2016-09-19 DIAGNOSIS — R262 Difficulty in walking, not elsewhere classified: Secondary | ICD-10-CM | POA: Diagnosis not present

## 2016-09-19 DIAGNOSIS — E039 Hypothyroidism, unspecified: Secondary | ICD-10-CM | POA: Diagnosis not present

## 2016-09-19 DIAGNOSIS — G7000 Myasthenia gravis without (acute) exacerbation: Secondary | ICD-10-CM | POA: Diagnosis not present

## 2016-09-19 DIAGNOSIS — M6281 Muscle weakness (generalized): Secondary | ICD-10-CM | POA: Diagnosis not present

## 2016-09-19 DIAGNOSIS — M79672 Pain in left foot: Secondary | ICD-10-CM | POA: Diagnosis not present

## 2016-09-19 DIAGNOSIS — Z9181 History of falling: Secondary | ICD-10-CM | POA: Diagnosis not present

## 2016-09-19 DIAGNOSIS — E782 Mixed hyperlipidemia: Secondary | ICD-10-CM | POA: Diagnosis not present

## 2016-09-22 DIAGNOSIS — E039 Hypothyroidism, unspecified: Secondary | ICD-10-CM | POA: Diagnosis not present

## 2016-09-22 DIAGNOSIS — E782 Mixed hyperlipidemia: Secondary | ICD-10-CM | POA: Diagnosis not present

## 2016-09-22 DIAGNOSIS — G7000 Myasthenia gravis without (acute) exacerbation: Secondary | ICD-10-CM | POA: Diagnosis not present

## 2016-09-22 DIAGNOSIS — Z9181 History of falling: Secondary | ICD-10-CM | POA: Diagnosis not present

## 2016-09-26 DIAGNOSIS — G7000 Myasthenia gravis without (acute) exacerbation: Secondary | ICD-10-CM | POA: Diagnosis not present

## 2016-09-26 DIAGNOSIS — Z9181 History of falling: Secondary | ICD-10-CM | POA: Diagnosis not present

## 2016-09-26 DIAGNOSIS — E782 Mixed hyperlipidemia: Secondary | ICD-10-CM | POA: Diagnosis not present

## 2016-09-26 DIAGNOSIS — E039 Hypothyroidism, unspecified: Secondary | ICD-10-CM | POA: Diagnosis not present

## 2016-09-27 DIAGNOSIS — I4891 Unspecified atrial fibrillation: Secondary | ICD-10-CM | POA: Diagnosis not present

## 2016-09-27 DIAGNOSIS — M961 Postlaminectomy syndrome, not elsewhere classified: Secondary | ICD-10-CM | POA: Diagnosis not present

## 2016-09-27 DIAGNOSIS — J189 Pneumonia, unspecified organism: Secondary | ICD-10-CM | POA: Diagnosis not present

## 2016-09-27 DIAGNOSIS — I10 Essential (primary) hypertension: Secondary | ICD-10-CM | POA: Diagnosis not present

## 2016-09-28 DIAGNOSIS — R262 Difficulty in walking, not elsewhere classified: Secondary | ICD-10-CM | POA: Diagnosis not present

## 2016-09-28 DIAGNOSIS — M79672 Pain in left foot: Secondary | ICD-10-CM | POA: Diagnosis not present

## 2016-09-28 DIAGNOSIS — M6281 Muscle weakness (generalized): Secondary | ICD-10-CM | POA: Diagnosis not present

## 2016-09-28 DIAGNOSIS — Z9181 History of falling: Secondary | ICD-10-CM | POA: Diagnosis not present

## 2016-09-29 DIAGNOSIS — E782 Mixed hyperlipidemia: Secondary | ICD-10-CM | POA: Diagnosis not present

## 2016-09-29 DIAGNOSIS — Z9181 History of falling: Secondary | ICD-10-CM | POA: Diagnosis not present

## 2016-09-29 DIAGNOSIS — E039 Hypothyroidism, unspecified: Secondary | ICD-10-CM | POA: Diagnosis not present

## 2016-09-29 DIAGNOSIS — G7000 Myasthenia gravis without (acute) exacerbation: Secondary | ICD-10-CM | POA: Diagnosis not present

## 2016-09-30 DIAGNOSIS — R262 Difficulty in walking, not elsewhere classified: Secondary | ICD-10-CM | POA: Diagnosis not present

## 2016-09-30 DIAGNOSIS — Z9181 History of falling: Secondary | ICD-10-CM | POA: Diagnosis not present

## 2016-09-30 DIAGNOSIS — M6281 Muscle weakness (generalized): Secondary | ICD-10-CM | POA: Diagnosis not present

## 2016-09-30 DIAGNOSIS — M79672 Pain in left foot: Secondary | ICD-10-CM | POA: Diagnosis not present

## 2016-10-03 DIAGNOSIS — L97921 Non-pressure chronic ulcer of unspecified part of left lower leg limited to breakdown of skin: Secondary | ICD-10-CM | POA: Diagnosis not present

## 2016-10-03 DIAGNOSIS — E782 Mixed hyperlipidemia: Secondary | ICD-10-CM | POA: Diagnosis not present

## 2016-10-03 DIAGNOSIS — G7000 Myasthenia gravis without (acute) exacerbation: Secondary | ICD-10-CM | POA: Diagnosis not present

## 2016-10-03 DIAGNOSIS — E039 Hypothyroidism, unspecified: Secondary | ICD-10-CM | POA: Diagnosis not present

## 2016-10-03 DIAGNOSIS — Z9181 History of falling: Secondary | ICD-10-CM | POA: Diagnosis not present

## 2016-10-03 DIAGNOSIS — M6281 Muscle weakness (generalized): Secondary | ICD-10-CM | POA: Diagnosis not present

## 2016-10-03 DIAGNOSIS — R262 Difficulty in walking, not elsewhere classified: Secondary | ICD-10-CM | POA: Diagnosis not present

## 2016-10-03 DIAGNOSIS — M79672 Pain in left foot: Secondary | ICD-10-CM | POA: Diagnosis not present

## 2016-10-03 DIAGNOSIS — R05 Cough: Secondary | ICD-10-CM | POA: Diagnosis not present

## 2016-10-05 DIAGNOSIS — M79672 Pain in left foot: Secondary | ICD-10-CM | POA: Diagnosis not present

## 2016-10-05 DIAGNOSIS — R262 Difficulty in walking, not elsewhere classified: Secondary | ICD-10-CM | POA: Diagnosis not present

## 2016-10-05 DIAGNOSIS — Z9181 History of falling: Secondary | ICD-10-CM | POA: Diagnosis not present

## 2016-10-05 DIAGNOSIS — M6281 Muscle weakness (generalized): Secondary | ICD-10-CM | POA: Diagnosis not present

## 2016-10-06 DIAGNOSIS — E039 Hypothyroidism, unspecified: Secondary | ICD-10-CM | POA: Diagnosis not present

## 2016-10-06 DIAGNOSIS — E782 Mixed hyperlipidemia: Secondary | ICD-10-CM | POA: Diagnosis not present

## 2016-10-06 DIAGNOSIS — G7000 Myasthenia gravis without (acute) exacerbation: Secondary | ICD-10-CM | POA: Diagnosis not present

## 2016-10-06 DIAGNOSIS — Z9181 History of falling: Secondary | ICD-10-CM | POA: Diagnosis not present

## 2016-10-10 DIAGNOSIS — L97921 Non-pressure chronic ulcer of unspecified part of left lower leg limited to breakdown of skin: Secondary | ICD-10-CM | POA: Diagnosis not present

## 2016-10-10 DIAGNOSIS — G7000 Myasthenia gravis without (acute) exacerbation: Secondary | ICD-10-CM | POA: Diagnosis not present

## 2016-10-10 DIAGNOSIS — R05 Cough: Secondary | ICD-10-CM | POA: Diagnosis not present

## 2016-10-11 DIAGNOSIS — Y92018 Other place in single-family (private) house as the place of occurrence of the external cause: Secondary | ICD-10-CM | POA: Diagnosis not present

## 2016-10-11 DIAGNOSIS — R69 Illness, unspecified: Secondary | ICD-10-CM | POA: Diagnosis not present

## 2016-10-11 DIAGNOSIS — Z96651 Presence of right artificial knee joint: Secondary | ICD-10-CM | POA: Diagnosis not present

## 2016-10-11 DIAGNOSIS — M9711XA Periprosthetic fracture around internal prosthetic right knee joint, initial encounter: Secondary | ICD-10-CM | POA: Diagnosis not present

## 2016-10-12 DIAGNOSIS — I4891 Unspecified atrial fibrillation: Secondary | ICD-10-CM | POA: Diagnosis not present

## 2016-10-12 DIAGNOSIS — R69 Illness, unspecified: Secondary | ICD-10-CM | POA: Diagnosis not present

## 2016-10-12 DIAGNOSIS — M961 Postlaminectomy syndrome, not elsewhere classified: Secondary | ICD-10-CM | POA: Diagnosis not present

## 2016-10-12 DIAGNOSIS — I10 Essential (primary) hypertension: Secondary | ICD-10-CM | POA: Diagnosis not present

## 2016-10-17 DIAGNOSIS — R69 Illness, unspecified: Secondary | ICD-10-CM | POA: Diagnosis not present

## 2016-10-17 DIAGNOSIS — G4700 Insomnia, unspecified: Secondary | ICD-10-CM | POA: Diagnosis not present

## 2016-10-17 DIAGNOSIS — M549 Dorsalgia, unspecified: Secondary | ICD-10-CM | POA: Diagnosis not present

## 2016-10-17 DIAGNOSIS — J449 Chronic obstructive pulmonary disease, unspecified: Secondary | ICD-10-CM | POA: Diagnosis not present

## 2016-10-17 DIAGNOSIS — E039 Hypothyroidism, unspecified: Secondary | ICD-10-CM | POA: Diagnosis not present

## 2016-10-17 DIAGNOSIS — M159 Polyosteoarthritis, unspecified: Secondary | ICD-10-CM | POA: Diagnosis not present

## 2016-10-17 DIAGNOSIS — K219 Gastro-esophageal reflux disease without esophagitis: Secondary | ICD-10-CM | POA: Diagnosis not present

## 2016-10-17 DIAGNOSIS — I10 Essential (primary) hypertension: Secondary | ICD-10-CM | POA: Diagnosis not present

## 2016-10-17 DIAGNOSIS — R5382 Chronic fatigue, unspecified: Secondary | ICD-10-CM | POA: Diagnosis not present

## 2016-10-17 DIAGNOSIS — E7800 Pure hypercholesterolemia, unspecified: Secondary | ICD-10-CM | POA: Diagnosis not present

## 2016-10-17 DIAGNOSIS — I4891 Unspecified atrial fibrillation: Secondary | ICD-10-CM | POA: Diagnosis not present

## 2016-10-18 DIAGNOSIS — I4891 Unspecified atrial fibrillation: Secondary | ICD-10-CM | POA: Diagnosis not present

## 2016-10-18 DIAGNOSIS — J449 Chronic obstructive pulmonary disease, unspecified: Secondary | ICD-10-CM | POA: Diagnosis not present

## 2016-10-18 DIAGNOSIS — M6281 Muscle weakness (generalized): Secondary | ICD-10-CM | POA: Diagnosis not present

## 2016-10-18 DIAGNOSIS — M25561 Pain in right knee: Secondary | ICD-10-CM | POA: Diagnosis not present

## 2016-10-18 DIAGNOSIS — I10 Essential (primary) hypertension: Secondary | ICD-10-CM | POA: Diagnosis not present

## 2016-10-18 DIAGNOSIS — R262 Difficulty in walking, not elsewhere classified: Secondary | ICD-10-CM | POA: Diagnosis not present

## 2016-10-18 DIAGNOSIS — S72401D Unspecified fracture of lower end of right femur, subsequent encounter for closed fracture with routine healing: Secondary | ICD-10-CM | POA: Diagnosis not present

## 2016-10-18 DIAGNOSIS — M9701XD Periprosthetic fracture around internal prosthetic right hip joint, subsequent encounter: Secondary | ICD-10-CM | POA: Diagnosis not present

## 2016-10-18 DIAGNOSIS — Z5189 Encounter for other specified aftercare: Secondary | ICD-10-CM | POA: Diagnosis not present

## 2016-10-19 DIAGNOSIS — I4891 Unspecified atrial fibrillation: Secondary | ICD-10-CM | POA: Diagnosis not present

## 2016-10-19 DIAGNOSIS — Z5189 Encounter for other specified aftercare: Secondary | ICD-10-CM | POA: Diagnosis not present

## 2016-10-19 DIAGNOSIS — M25561 Pain in right knee: Secondary | ICD-10-CM | POA: Diagnosis not present

## 2016-10-19 DIAGNOSIS — M6281 Muscle weakness (generalized): Secondary | ICD-10-CM | POA: Diagnosis not present

## 2016-10-19 DIAGNOSIS — R262 Difficulty in walking, not elsewhere classified: Secondary | ICD-10-CM | POA: Diagnosis not present

## 2016-10-21 DIAGNOSIS — M6281 Muscle weakness (generalized): Secondary | ICD-10-CM | POA: Diagnosis not present

## 2016-10-21 DIAGNOSIS — M25561 Pain in right knee: Secondary | ICD-10-CM | POA: Diagnosis not present

## 2016-10-21 DIAGNOSIS — I4891 Unspecified atrial fibrillation: Secondary | ICD-10-CM | POA: Diagnosis not present

## 2016-10-21 DIAGNOSIS — Z5189 Encounter for other specified aftercare: Secondary | ICD-10-CM | POA: Diagnosis not present

## 2016-10-21 DIAGNOSIS — R262 Difficulty in walking, not elsewhere classified: Secondary | ICD-10-CM | POA: Diagnosis not present

## 2016-10-24 DIAGNOSIS — J449 Chronic obstructive pulmonary disease, unspecified: Secondary | ICD-10-CM | POA: Diagnosis not present

## 2016-10-24 DIAGNOSIS — M9701XD Periprosthetic fracture around internal prosthetic right hip joint, subsequent encounter: Secondary | ICD-10-CM | POA: Diagnosis not present

## 2016-10-24 DIAGNOSIS — I10 Essential (primary) hypertension: Secondary | ICD-10-CM | POA: Diagnosis not present

## 2016-10-24 DIAGNOSIS — S72401D Unspecified fracture of lower end of right femur, subsequent encounter for closed fracture with routine healing: Secondary | ICD-10-CM | POA: Diagnosis not present

## 2016-10-24 DIAGNOSIS — I4891 Unspecified atrial fibrillation: Secondary | ICD-10-CM | POA: Diagnosis not present

## 2016-10-25 DIAGNOSIS — M9701XD Periprosthetic fracture around internal prosthetic right hip joint, subsequent encounter: Secondary | ICD-10-CM | POA: Diagnosis not present

## 2016-10-25 DIAGNOSIS — S72401D Unspecified fracture of lower end of right femur, subsequent encounter for closed fracture with routine healing: Secondary | ICD-10-CM | POA: Diagnosis not present

## 2016-10-25 DIAGNOSIS — J449 Chronic obstructive pulmonary disease, unspecified: Secondary | ICD-10-CM | POA: Diagnosis not present

## 2016-10-25 DIAGNOSIS — I4891 Unspecified atrial fibrillation: Secondary | ICD-10-CM | POA: Diagnosis not present

## 2016-10-25 DIAGNOSIS — I10 Essential (primary) hypertension: Secondary | ICD-10-CM | POA: Diagnosis not present

## 2016-10-26 DIAGNOSIS — I10 Essential (primary) hypertension: Secondary | ICD-10-CM | POA: Diagnosis not present

## 2016-10-26 DIAGNOSIS — M549 Dorsalgia, unspecified: Secondary | ICD-10-CM | POA: Diagnosis not present

## 2016-10-26 DIAGNOSIS — K219 Gastro-esophageal reflux disease without esophagitis: Secondary | ICD-10-CM | POA: Diagnosis not present

## 2016-10-26 DIAGNOSIS — N259 Disorder resulting from impaired renal tubular function, unspecified: Secondary | ICD-10-CM | POA: Diagnosis not present

## 2016-10-26 DIAGNOSIS — R69 Illness, unspecified: Secondary | ICD-10-CM | POA: Diagnosis not present

## 2016-10-26 DIAGNOSIS — Z6823 Body mass index (BMI) 23.0-23.9, adult: Secondary | ICD-10-CM | POA: Diagnosis not present

## 2016-10-26 DIAGNOSIS — E039 Hypothyroidism, unspecified: Secondary | ICD-10-CM | POA: Diagnosis not present

## 2016-10-26 DIAGNOSIS — J449 Chronic obstructive pulmonary disease, unspecified: Secondary | ICD-10-CM | POA: Diagnosis not present

## 2016-10-26 DIAGNOSIS — M159 Polyosteoarthritis, unspecified: Secondary | ICD-10-CM | POA: Diagnosis not present

## 2016-10-26 DIAGNOSIS — G4700 Insomnia, unspecified: Secondary | ICD-10-CM | POA: Diagnosis not present

## 2016-10-27 DIAGNOSIS — I4891 Unspecified atrial fibrillation: Secondary | ICD-10-CM | POA: Diagnosis not present

## 2016-10-27 DIAGNOSIS — S72401D Unspecified fracture of lower end of right femur, subsequent encounter for closed fracture with routine healing: Secondary | ICD-10-CM | POA: Diagnosis not present

## 2016-10-27 DIAGNOSIS — J449 Chronic obstructive pulmonary disease, unspecified: Secondary | ICD-10-CM | POA: Diagnosis not present

## 2016-10-27 DIAGNOSIS — I10 Essential (primary) hypertension: Secondary | ICD-10-CM | POA: Diagnosis not present

## 2016-10-27 DIAGNOSIS — M9701XD Periprosthetic fracture around internal prosthetic right hip joint, subsequent encounter: Secondary | ICD-10-CM | POA: Diagnosis not present

## 2016-10-28 DIAGNOSIS — S72401D Unspecified fracture of lower end of right femur, subsequent encounter for closed fracture with routine healing: Secondary | ICD-10-CM | POA: Diagnosis not present

## 2016-10-28 DIAGNOSIS — I10 Essential (primary) hypertension: Secondary | ICD-10-CM | POA: Diagnosis not present

## 2016-10-28 DIAGNOSIS — J449 Chronic obstructive pulmonary disease, unspecified: Secondary | ICD-10-CM | POA: Diagnosis not present

## 2016-10-28 DIAGNOSIS — M9701XD Periprosthetic fracture around internal prosthetic right hip joint, subsequent encounter: Secondary | ICD-10-CM | POA: Diagnosis not present

## 2016-10-28 DIAGNOSIS — I4891 Unspecified atrial fibrillation: Secondary | ICD-10-CM | POA: Diagnosis not present

## 2016-10-28 DIAGNOSIS — Z96651 Presence of right artificial knee joint: Secondary | ICD-10-CM | POA: Diagnosis not present

## 2016-10-28 DIAGNOSIS — M9711XD Periprosthetic fracture around internal prosthetic right knee joint, subsequent encounter: Secondary | ICD-10-CM | POA: Diagnosis not present

## 2016-10-28 DIAGNOSIS — G8929 Other chronic pain: Secondary | ICD-10-CM | POA: Diagnosis not present

## 2016-11-02 DIAGNOSIS — I4891 Unspecified atrial fibrillation: Secondary | ICD-10-CM | POA: Diagnosis not present

## 2016-11-02 DIAGNOSIS — J449 Chronic obstructive pulmonary disease, unspecified: Secondary | ICD-10-CM | POA: Diagnosis not present

## 2016-11-02 DIAGNOSIS — S72401D Unspecified fracture of lower end of right femur, subsequent encounter for closed fracture with routine healing: Secondary | ICD-10-CM | POA: Diagnosis not present

## 2016-11-02 DIAGNOSIS — M9701XD Periprosthetic fracture around internal prosthetic right hip joint, subsequent encounter: Secondary | ICD-10-CM | POA: Diagnosis not present

## 2016-11-02 DIAGNOSIS — I10 Essential (primary) hypertension: Secondary | ICD-10-CM | POA: Diagnosis not present

## 2016-11-03 DIAGNOSIS — I4891 Unspecified atrial fibrillation: Secondary | ICD-10-CM | POA: Diagnosis not present

## 2016-11-03 DIAGNOSIS — S72401D Unspecified fracture of lower end of right femur, subsequent encounter for closed fracture with routine healing: Secondary | ICD-10-CM | POA: Diagnosis not present

## 2016-11-03 DIAGNOSIS — M9701XD Periprosthetic fracture around internal prosthetic right hip joint, subsequent encounter: Secondary | ICD-10-CM | POA: Diagnosis not present

## 2016-11-03 DIAGNOSIS — J449 Chronic obstructive pulmonary disease, unspecified: Secondary | ICD-10-CM | POA: Diagnosis not present

## 2016-11-03 DIAGNOSIS — I10 Essential (primary) hypertension: Secondary | ICD-10-CM | POA: Diagnosis not present

## 2016-11-04 DIAGNOSIS — G7000 Myasthenia gravis without (acute) exacerbation: Secondary | ICD-10-CM | POA: Diagnosis not present

## 2016-11-04 DIAGNOSIS — L89152 Pressure ulcer of sacral region, stage 2: Secondary | ICD-10-CM | POA: Diagnosis not present

## 2016-11-04 DIAGNOSIS — M159 Polyosteoarthritis, unspecified: Secondary | ICD-10-CM | POA: Diagnosis not present

## 2016-11-04 DIAGNOSIS — I10 Essential (primary) hypertension: Secondary | ICD-10-CM | POA: Diagnosis not present

## 2016-11-04 DIAGNOSIS — S7291XD Unspecified fracture of right femur, subsequent encounter for closed fracture with routine healing: Secondary | ICD-10-CM | POA: Diagnosis not present

## 2016-11-04 DIAGNOSIS — M978XXA Periprosthetic fracture around other internal prosthetic joint, initial encounter: Secondary | ICD-10-CM | POA: Diagnosis not present

## 2016-11-04 DIAGNOSIS — J449 Chronic obstructive pulmonary disease, unspecified: Secondary | ICD-10-CM | POA: Diagnosis not present

## 2016-11-07 DIAGNOSIS — J449 Chronic obstructive pulmonary disease, unspecified: Secondary | ICD-10-CM | POA: Diagnosis not present

## 2016-11-07 DIAGNOSIS — S7291XD Unspecified fracture of right femur, subsequent encounter for closed fracture with routine healing: Secondary | ICD-10-CM | POA: Diagnosis not present

## 2016-11-07 DIAGNOSIS — I10 Essential (primary) hypertension: Secondary | ICD-10-CM | POA: Diagnosis not present

## 2016-11-07 DIAGNOSIS — G7000 Myasthenia gravis without (acute) exacerbation: Secondary | ICD-10-CM | POA: Diagnosis not present

## 2016-11-07 DIAGNOSIS — M159 Polyosteoarthritis, unspecified: Secondary | ICD-10-CM | POA: Diagnosis not present

## 2016-11-07 DIAGNOSIS — L89152 Pressure ulcer of sacral region, stage 2: Secondary | ICD-10-CM | POA: Diagnosis not present

## 2016-11-08 DIAGNOSIS — L89152 Pressure ulcer of sacral region, stage 2: Secondary | ICD-10-CM | POA: Diagnosis not present

## 2016-11-08 DIAGNOSIS — M158 Other polyosteoarthritis: Secondary | ICD-10-CM | POA: Diagnosis not present

## 2016-11-08 DIAGNOSIS — I4891 Unspecified atrial fibrillation: Secondary | ICD-10-CM | POA: Diagnosis not present

## 2016-11-08 DIAGNOSIS — M5020 Other cervical disc displacement, unspecified cervical region: Secondary | ICD-10-CM | POA: Diagnosis not present

## 2016-11-08 DIAGNOSIS — I10 Essential (primary) hypertension: Secondary | ICD-10-CM | POA: Diagnosis not present

## 2016-11-08 DIAGNOSIS — R079 Chest pain, unspecified: Secondary | ICD-10-CM | POA: Diagnosis not present

## 2016-11-08 DIAGNOSIS — L89892 Pressure ulcer of other site, stage 2: Secondary | ICD-10-CM | POA: Diagnosis not present

## 2016-11-08 DIAGNOSIS — E038 Other specified hypothyroidism: Secondary | ICD-10-CM | POA: Diagnosis not present

## 2016-11-08 DIAGNOSIS — M159 Polyosteoarthritis, unspecified: Secondary | ICD-10-CM | POA: Diagnosis not present

## 2016-11-08 DIAGNOSIS — S7291XD Unspecified fracture of right femur, subsequent encounter for closed fracture with routine healing: Secondary | ICD-10-CM | POA: Diagnosis not present

## 2016-11-08 DIAGNOSIS — E784 Other hyperlipidemia: Secondary | ICD-10-CM | POA: Diagnosis not present

## 2016-11-08 DIAGNOSIS — J449 Chronic obstructive pulmonary disease, unspecified: Secondary | ICD-10-CM | POA: Diagnosis not present

## 2016-11-08 DIAGNOSIS — G7000 Myasthenia gravis without (acute) exacerbation: Secondary | ICD-10-CM | POA: Diagnosis not present

## 2016-11-08 DIAGNOSIS — G894 Chronic pain syndrome: Secondary | ICD-10-CM | POA: Diagnosis not present

## 2016-11-09 DIAGNOSIS — L89152 Pressure ulcer of sacral region, stage 2: Secondary | ICD-10-CM | POA: Diagnosis not present

## 2016-11-09 DIAGNOSIS — I10 Essential (primary) hypertension: Secondary | ICD-10-CM | POA: Diagnosis not present

## 2016-11-09 DIAGNOSIS — J449 Chronic obstructive pulmonary disease, unspecified: Secondary | ICD-10-CM | POA: Diagnosis not present

## 2016-11-09 DIAGNOSIS — S7291XD Unspecified fracture of right femur, subsequent encounter for closed fracture with routine healing: Secondary | ICD-10-CM | POA: Diagnosis not present

## 2016-11-09 DIAGNOSIS — M159 Polyosteoarthritis, unspecified: Secondary | ICD-10-CM | POA: Diagnosis not present

## 2016-11-09 DIAGNOSIS — G7000 Myasthenia gravis without (acute) exacerbation: Secondary | ICD-10-CM | POA: Diagnosis not present

## 2016-11-11 DIAGNOSIS — S7291XD Unspecified fracture of right femur, subsequent encounter for closed fracture with routine healing: Secondary | ICD-10-CM | POA: Diagnosis not present

## 2016-11-11 DIAGNOSIS — L89152 Pressure ulcer of sacral region, stage 2: Secondary | ICD-10-CM | POA: Diagnosis not present

## 2016-11-11 DIAGNOSIS — I10 Essential (primary) hypertension: Secondary | ICD-10-CM | POA: Diagnosis not present

## 2016-11-11 DIAGNOSIS — G7000 Myasthenia gravis without (acute) exacerbation: Secondary | ICD-10-CM | POA: Diagnosis not present

## 2016-11-11 DIAGNOSIS — J449 Chronic obstructive pulmonary disease, unspecified: Secondary | ICD-10-CM | POA: Diagnosis not present

## 2016-11-11 DIAGNOSIS — M159 Polyosteoarthritis, unspecified: Secondary | ICD-10-CM | POA: Diagnosis not present

## 2016-11-15 DIAGNOSIS — G7000 Myasthenia gravis without (acute) exacerbation: Secondary | ICD-10-CM | POA: Diagnosis not present

## 2016-11-15 DIAGNOSIS — I10 Essential (primary) hypertension: Secondary | ICD-10-CM | POA: Diagnosis not present

## 2016-11-15 DIAGNOSIS — L89152 Pressure ulcer of sacral region, stage 2: Secondary | ICD-10-CM | POA: Diagnosis not present

## 2016-11-15 DIAGNOSIS — S7291XD Unspecified fracture of right femur, subsequent encounter for closed fracture with routine healing: Secondary | ICD-10-CM | POA: Diagnosis not present

## 2016-11-15 DIAGNOSIS — M159 Polyosteoarthritis, unspecified: Secondary | ICD-10-CM | POA: Diagnosis not present

## 2016-11-15 DIAGNOSIS — J449 Chronic obstructive pulmonary disease, unspecified: Secondary | ICD-10-CM | POA: Diagnosis not present

## 2016-11-17 DIAGNOSIS — I10 Essential (primary) hypertension: Secondary | ICD-10-CM | POA: Diagnosis not present

## 2016-11-17 DIAGNOSIS — J449 Chronic obstructive pulmonary disease, unspecified: Secondary | ICD-10-CM | POA: Diagnosis not present

## 2016-11-17 DIAGNOSIS — S7291XD Unspecified fracture of right femur, subsequent encounter for closed fracture with routine healing: Secondary | ICD-10-CM | POA: Diagnosis not present

## 2016-11-17 DIAGNOSIS — M159 Polyosteoarthritis, unspecified: Secondary | ICD-10-CM | POA: Diagnosis not present

## 2016-11-17 DIAGNOSIS — L89152 Pressure ulcer of sacral region, stage 2: Secondary | ICD-10-CM | POA: Diagnosis not present

## 2016-11-17 DIAGNOSIS — G7000 Myasthenia gravis without (acute) exacerbation: Secondary | ICD-10-CM | POA: Diagnosis not present

## 2016-11-18 DIAGNOSIS — J449 Chronic obstructive pulmonary disease, unspecified: Secondary | ICD-10-CM | POA: Diagnosis not present

## 2016-11-18 DIAGNOSIS — L89152 Pressure ulcer of sacral region, stage 2: Secondary | ICD-10-CM | POA: Diagnosis not present

## 2016-11-18 DIAGNOSIS — S7291XD Unspecified fracture of right femur, subsequent encounter for closed fracture with routine healing: Secondary | ICD-10-CM | POA: Diagnosis not present

## 2016-11-18 DIAGNOSIS — M159 Polyosteoarthritis, unspecified: Secondary | ICD-10-CM | POA: Diagnosis not present

## 2016-11-18 DIAGNOSIS — G7000 Myasthenia gravis without (acute) exacerbation: Secondary | ICD-10-CM | POA: Diagnosis not present

## 2016-11-18 DIAGNOSIS — I10 Essential (primary) hypertension: Secondary | ICD-10-CM | POA: Diagnosis not present

## 2016-11-22 DIAGNOSIS — M159 Polyosteoarthritis, unspecified: Secondary | ICD-10-CM | POA: Diagnosis not present

## 2016-11-22 DIAGNOSIS — J449 Chronic obstructive pulmonary disease, unspecified: Secondary | ICD-10-CM | POA: Diagnosis not present

## 2016-11-22 DIAGNOSIS — S7291XD Unspecified fracture of right femur, subsequent encounter for closed fracture with routine healing: Secondary | ICD-10-CM | POA: Diagnosis not present

## 2016-11-22 DIAGNOSIS — G7000 Myasthenia gravis without (acute) exacerbation: Secondary | ICD-10-CM | POA: Diagnosis not present

## 2016-11-22 DIAGNOSIS — L89319 Pressure ulcer of right buttock, unspecified stage: Secondary | ICD-10-CM | POA: Diagnosis not present

## 2016-11-22 DIAGNOSIS — I10 Essential (primary) hypertension: Secondary | ICD-10-CM | POA: Diagnosis not present

## 2016-11-22 DIAGNOSIS — I4891 Unspecified atrial fibrillation: Secondary | ICD-10-CM | POA: Diagnosis not present

## 2016-11-22 DIAGNOSIS — L89152 Pressure ulcer of sacral region, stage 2: Secondary | ICD-10-CM | POA: Diagnosis not present

## 2016-11-22 DIAGNOSIS — L89329 Pressure ulcer of left buttock, unspecified stage: Secondary | ICD-10-CM | POA: Diagnosis not present

## 2016-11-22 DIAGNOSIS — K5901 Slow transit constipation: Secondary | ICD-10-CM | POA: Diagnosis not present

## 2016-11-22 DIAGNOSIS — E038 Other specified hypothyroidism: Secondary | ICD-10-CM | POA: Diagnosis not present

## 2016-11-23 DIAGNOSIS — I10 Essential (primary) hypertension: Secondary | ICD-10-CM | POA: Diagnosis not present

## 2016-11-23 DIAGNOSIS — G7000 Myasthenia gravis without (acute) exacerbation: Secondary | ICD-10-CM | POA: Diagnosis not present

## 2016-11-23 DIAGNOSIS — S7291XD Unspecified fracture of right femur, subsequent encounter for closed fracture with routine healing: Secondary | ICD-10-CM | POA: Diagnosis not present

## 2016-11-23 DIAGNOSIS — M159 Polyosteoarthritis, unspecified: Secondary | ICD-10-CM | POA: Diagnosis not present

## 2016-11-23 DIAGNOSIS — J449 Chronic obstructive pulmonary disease, unspecified: Secondary | ICD-10-CM | POA: Diagnosis not present

## 2016-11-23 DIAGNOSIS — L89152 Pressure ulcer of sacral region, stage 2: Secondary | ICD-10-CM | POA: Diagnosis not present

## 2016-11-24 DIAGNOSIS — S7291XD Unspecified fracture of right femur, subsequent encounter for closed fracture with routine healing: Secondary | ICD-10-CM | POA: Diagnosis not present

## 2016-11-24 DIAGNOSIS — I10 Essential (primary) hypertension: Secondary | ICD-10-CM | POA: Diagnosis not present

## 2016-11-24 DIAGNOSIS — M159 Polyosteoarthritis, unspecified: Secondary | ICD-10-CM | POA: Diagnosis not present

## 2016-11-24 DIAGNOSIS — G7000 Myasthenia gravis without (acute) exacerbation: Secondary | ICD-10-CM | POA: Diagnosis not present

## 2016-11-24 DIAGNOSIS — J449 Chronic obstructive pulmonary disease, unspecified: Secondary | ICD-10-CM | POA: Diagnosis not present

## 2016-11-24 DIAGNOSIS — L89152 Pressure ulcer of sacral region, stage 2: Secondary | ICD-10-CM | POA: Diagnosis not present

## 2016-11-27 DIAGNOSIS — M9711XD Periprosthetic fracture around internal prosthetic right knee joint, subsequent encounter: Secondary | ICD-10-CM | POA: Diagnosis not present

## 2016-11-27 DIAGNOSIS — G8929 Other chronic pain: Secondary | ICD-10-CM | POA: Diagnosis not present

## 2016-11-27 DIAGNOSIS — Z96651 Presence of right artificial knee joint: Secondary | ICD-10-CM | POA: Diagnosis not present

## 2016-11-28 DIAGNOSIS — G7000 Myasthenia gravis without (acute) exacerbation: Secondary | ICD-10-CM | POA: Diagnosis not present

## 2016-11-28 DIAGNOSIS — L89152 Pressure ulcer of sacral region, stage 2: Secondary | ICD-10-CM | POA: Diagnosis not present

## 2016-11-28 DIAGNOSIS — M159 Polyosteoarthritis, unspecified: Secondary | ICD-10-CM | POA: Diagnosis not present

## 2016-11-28 DIAGNOSIS — S7291XD Unspecified fracture of right femur, subsequent encounter for closed fracture with routine healing: Secondary | ICD-10-CM | POA: Diagnosis not present

## 2016-11-28 DIAGNOSIS — J449 Chronic obstructive pulmonary disease, unspecified: Secondary | ICD-10-CM | POA: Diagnosis not present

## 2016-11-28 DIAGNOSIS — I10 Essential (primary) hypertension: Secondary | ICD-10-CM | POA: Diagnosis not present

## 2016-11-29 ENCOUNTER — Ambulatory Visit: Payer: Self-pay | Admitting: Orthopedic Surgery

## 2016-11-29 DIAGNOSIS — Z96651 Presence of right artificial knee joint: Secondary | ICD-10-CM | POA: Diagnosis not present

## 2016-11-29 DIAGNOSIS — G7000 Myasthenia gravis without (acute) exacerbation: Secondary | ICD-10-CM | POA: Diagnosis not present

## 2016-11-29 DIAGNOSIS — J449 Chronic obstructive pulmonary disease, unspecified: Secondary | ICD-10-CM | POA: Diagnosis not present

## 2016-11-29 DIAGNOSIS — S7291XD Unspecified fracture of right femur, subsequent encounter for closed fracture with routine healing: Secondary | ICD-10-CM | POA: Diagnosis not present

## 2016-11-29 DIAGNOSIS — L89152 Pressure ulcer of sacral region, stage 2: Secondary | ICD-10-CM | POA: Diagnosis not present

## 2016-11-29 DIAGNOSIS — M159 Polyosteoarthritis, unspecified: Secondary | ICD-10-CM | POA: Diagnosis not present

## 2016-11-29 DIAGNOSIS — M9711XD Periprosthetic fracture around internal prosthetic right knee joint, subsequent encounter: Secondary | ICD-10-CM | POA: Diagnosis not present

## 2016-11-29 DIAGNOSIS — R609 Edema, unspecified: Secondary | ICD-10-CM | POA: Diagnosis not present

## 2016-11-29 DIAGNOSIS — G894 Chronic pain syndrome: Secondary | ICD-10-CM | POA: Diagnosis not present

## 2016-11-29 DIAGNOSIS — M545 Low back pain: Secondary | ICD-10-CM | POA: Diagnosis not present

## 2016-11-29 DIAGNOSIS — I4891 Unspecified atrial fibrillation: Secondary | ICD-10-CM | POA: Diagnosis not present

## 2016-11-29 DIAGNOSIS — J45909 Unspecified asthma, uncomplicated: Secondary | ICD-10-CM | POA: Diagnosis not present

## 2016-11-29 DIAGNOSIS — G8929 Other chronic pain: Secondary | ICD-10-CM | POA: Diagnosis not present

## 2016-11-29 DIAGNOSIS — I10 Essential (primary) hypertension: Secondary | ICD-10-CM | POA: Diagnosis not present

## 2016-11-30 DIAGNOSIS — I10 Essential (primary) hypertension: Secondary | ICD-10-CM | POA: Diagnosis not present

## 2016-11-30 DIAGNOSIS — M159 Polyosteoarthritis, unspecified: Secondary | ICD-10-CM | POA: Diagnosis not present

## 2016-11-30 DIAGNOSIS — G7000 Myasthenia gravis without (acute) exacerbation: Secondary | ICD-10-CM | POA: Diagnosis not present

## 2016-11-30 DIAGNOSIS — L89152 Pressure ulcer of sacral region, stage 2: Secondary | ICD-10-CM | POA: Diagnosis not present

## 2016-11-30 DIAGNOSIS — S7291XD Unspecified fracture of right femur, subsequent encounter for closed fracture with routine healing: Secondary | ICD-10-CM | POA: Diagnosis not present

## 2016-11-30 DIAGNOSIS — J449 Chronic obstructive pulmonary disease, unspecified: Secondary | ICD-10-CM | POA: Diagnosis not present

## 2016-12-01 DIAGNOSIS — L89152 Pressure ulcer of sacral region, stage 2: Secondary | ICD-10-CM | POA: Diagnosis not present

## 2016-12-01 DIAGNOSIS — M159 Polyosteoarthritis, unspecified: Secondary | ICD-10-CM | POA: Diagnosis not present

## 2016-12-01 DIAGNOSIS — G7000 Myasthenia gravis without (acute) exacerbation: Secondary | ICD-10-CM | POA: Diagnosis not present

## 2016-12-01 DIAGNOSIS — J449 Chronic obstructive pulmonary disease, unspecified: Secondary | ICD-10-CM | POA: Diagnosis not present

## 2016-12-01 DIAGNOSIS — S7291XD Unspecified fracture of right femur, subsequent encounter for closed fracture with routine healing: Secondary | ICD-10-CM | POA: Diagnosis not present

## 2016-12-01 DIAGNOSIS — I10 Essential (primary) hypertension: Secondary | ICD-10-CM | POA: Diagnosis not present

## 2016-12-02 ENCOUNTER — Encounter: Payer: Self-pay | Admitting: Cardiology

## 2016-12-02 ENCOUNTER — Ambulatory Visit (INDEPENDENT_AMBULATORY_CARE_PROVIDER_SITE_OTHER): Payer: Medicare HMO | Admitting: Cardiology

## 2016-12-02 VITALS — BP 138/74 | HR 80 | Ht 73.0 in

## 2016-12-02 DIAGNOSIS — I10 Essential (primary) hypertension: Secondary | ICD-10-CM

## 2016-12-02 DIAGNOSIS — Z01810 Encounter for preprocedural cardiovascular examination: Secondary | ICD-10-CM | POA: Diagnosis not present

## 2016-12-02 DIAGNOSIS — I482 Chronic atrial fibrillation: Secondary | ICD-10-CM

## 2016-12-02 DIAGNOSIS — I4821 Permanent atrial fibrillation: Secondary | ICD-10-CM

## 2016-12-02 DIAGNOSIS — I878 Other specified disorders of veins: Secondary | ICD-10-CM

## 2016-12-02 NOTE — Progress Notes (Addendum)
PCP: [name], MD  Clinic Note: Chief Complaint  Patient presents with  . New Admit To SNF     new patient , need pre op clearance - total knee--PAD screening,   . Pre-op Exam  . Atrial Fibrillation    HPI: Travis Ross is a 83 y.o. male who is being seen today for the Pre-operative evaluation at the request of Dr. Pilar Plate [name] & [name], MD.  Thayer Embleton was seen on 11/04/2016 by Dr. Wynelle Link for second opinion on his fractured femur that is now basically has him wheelchair-bound and completely mobilized. Her discussed the possibility of surgical repair which may potentially even end up with him losing his right leg.  Recent Hospitalizations: None  Studies Personally Reviewed - (if available, images/films reviewed: From Epic Chart or Care Everywhere)  No studies available.  EKG from [facility] difficult to read but shows atrial fibrillation with PVCs versus aberrantly conducted beats.  Interval History: Danil presents today with his daughter sitting in a wheelchair with his right leg extended in a brace, internally rotated. He is essentially completely mobilized. He does not strengthen his arms to manipulate himself with a wheelchair. He has long-standing atrial fibrillation which she has never felt from a dyspnea or palpitation standpoint. He says he was diagnosed 3-5 years ago while on EKG. He had a stress test and echocardiogram done in the past, but nothing recently. He has never had any symptoms. No rapid irregular heartbeats palpitations. He does have some mild edema more related to lower extremity muscle atrophy. He has clear venous stasis discoloration bilaterally and has had worse edema that he currently has. I doubt is related to heart failure because he has no PND or orthopnea. He told the swelling pretty well with standing dose of Lasix. He gets quite short of breath with trying to do transfers, this simply because it's such a major effort for him.  With this much effort, he denies any chest tightness or pressure.  For his A. fib, he started off on warfarin, but this was never controlled and therefore was switched to Plavix and not to a DOAC. He is on stable dose of metoprolol succinate for rate control.  Hoyte has multiple comorbidities, from a cardiac standpoint has been stable thankfully. He has not had any anginal type chest pain, sensation of rapid irregular heartbeats. No heart failure symptoms. He has never had a stroke. He does not have diabetes. He has baseline normal renal function. Unfortunately don't have those labs.  No melena, hematochezia, hematuria, or epstaxis.   ROS: A comprehensive was performed. Review of Systems  Constitutional: Positive for malaise/fatigue and weight loss.  HENT: Positive for hearing loss (Worsening left ear). Negative for congestion and nosebleeds.   Respiratory: Positive for shortness of breath (Baseline). Negative for cough (Occasionally in the morning) and wheezing.   Genitourinary: Negative for dysuria and hematuria.  Musculoskeletal: Positive for back pain, joint pain (Chronic diffuse arthritis. But for now the most painful issue is his right distal thigh abscess of the fracture. His leg is internally rotated and in a brace.), myalgias and neck pain.       Recent C3 fracture now has significantly reduced mobility and strength in the shoulders or arms.  Neurological: Positive for weakness (Globalized - essentially no strength in his upper extremities and now unable to use his right leg).  Psychiatric/Behavioral: Positive for depression and memory loss (Little bit). The patient is not nervous/anxious and does not have insomnia.  He is relatively good spirited given all of his comorbidities. He also indicates that he does not want to continue living if his leg is not able to be repaired and to the point where he can get back some mobility.  All other systems reviewed and are negative.   I  have reviewed and (if needed) personally updated the patient's problem list, medications, allergies, past medical and surgical history, social and family history.   Past Medical History:  Diagnosis Date  . COPD with chronic bronchitis and emphysema ([place])   . Essential hypertension   . Femur fracture, right ([place])    Related to a fall. Periprosthetic distal femur fracture (close to the right knee prosthesis).  > Initial plans were to treat with brace, however, has now progressed and may require surgery  . GERD (gastroesophageal reflux disease)   . Glaucoma   . Hyperlipidemia   . Hypothyroidism (acquired)    On Levothyroxine  . Myasthenia gravis ([place])    Now (prior to recent femur fracture) was relegated to maneuvering himself around on a Rolling Walker (Rollator) using his feet & sitting on the walker.  Does not have arm strength to mover a wheelchair or transfer.  . Osteoarthritis    Global osteoarthritis involving back, hips and knees as well as elbows.  . Permanent atrial fibrillation (HCC)    Long-standing (> 4 yrs). Initially evaluated with Stress Test & Echo - Pt reports that these studies were "normal"    Past Surgical History:  Procedure Laterality Date  . BACK SURGERY     x 5  . CARPAL TUNNEL RELEASE    . CHOLECYSTECTOMY    . ELBOW SURGERY  08/2015   Total of 3 surgeries  . REPLACEMENT TOTAL KNEE Bilateral    Right TKA 2003, left TKA 2010    Current Meds  Medication Sig  . acetaminophen (TYLENOL) 325 MG tablet Take 650 mg by mouth every 6 (six) hours as needed for moderate pain.  Marland Kitchen atorvastatin (LIPITOR) 10 MG tablet Take 10 mg by mouth 2 (two) times daily at 8 am and 4 pm.   . azaTHIOprine (IMURAN) 50 MG tablet Take 50 mg by mouth 2 (two) times daily.  . Brinzolamide-Brimonidine (SIMBRINZA) 1-0.2 % SUSP Place 1 drop into both eyes 2 (two) times daily.  . Cholecalciferol (VITAMIN D3) 5000 units CAPS Take 5,000 Units by mouth daily.  . clopidogrel (PLAVIX) 75 MG tablet  Take 75 mg by mouth every other day.   . docusate sodium (COLACE) 100 MG capsule Take 100 mg by mouth 2 (two) times daily.  . DULERA 100-5 MCG/ACT AERO Take 2 puffs by mouth 2 (two) times daily.   . furosemide (LASIX) 40 MG tablet Take 40 mg by mouth daily.  Marland Kitchen levothyroxine (SYNTHROID, LEVOTHROID) 175 MCG tablet Take 175 mcg by mouth daily.  Marland Kitchen losartan (COZAAR) 50 MG tablet Take 50 mg by mouth daily.  . magnesium hydroxide (MILK OF MAGNESIA) 400 MG/5ML suspension Take 30 mLs by mouth See admin instructions. Take 30 ml by mouth at bedtime every Monday, Wednesday and Friday. Take 30 ml by mouth every 24 hours as needed for constipation.  . metoprolol succinate (TOPROL-XL) 50 MG 24 hr tablet Take 50 mg by mouth daily.  Marland Kitchen oxycodone (ROXICODONE) 30 MG immediate release tablet Take 30 mg by mouth every 12 (twelve) hours.  . Oxycodone HCl 10 MG TABS Take 10 mg by mouth every 4 (four) hours as needed (for pain).   . pantoprazole (  PROTONIX) 40 MG tablet Take 40 mg by mouth 2 (two) times daily.   . polyethylene glycol (MIRALAX / GLYCOLAX) packet Take 17 g by mouth daily.  . predniSONE (DELTASONE) 20 MG tablet Take 10 mg by mouth daily with breakfast.  . Psyllium (FIBER) 0.52 g CAPS Take 0.52 g by mouth at bedtime.  . senna (SENOKOT) 8.6 MG tablet Take 1 tablet by mouth every evening.  . sertraline (ZOLOFT) 100 MG tablet Take 150 mg by mouth daily.   . temazepam (RESTORIL) 30 MG capsule Take 30 mg by mouth at bedtime.   . TRAVATAN Z 0.004 % SOLN ophthalmic solution Place 1 drop into both eyes at bedtime.     No Known Allergies  Social History   Social History  . Marital status: Widowed    Spouse name: N/A  . Number of children: 6  . Years of education: 74   Occupational History  . 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 Smoking status: Never Smoker  . Smokeless tobacco: Never Used  . Alcohol use No  . Drug use: No  . Sexual activity: No   Other  Topics Concern  . None   Social History Narrative   He is a widower, who is recently moved down to [place] to live in assisted living facility to his daughter.   He has 6 total children, 7 grandchildren and 2 great-grandchildren.    He currently lives in assisted living facility, and is essentially immobilized.    Prior to his fall and leg fracture, he was able to maneuver himself around using a rolling walker, however since the fall and fracture, he is now essentially immobile requiring assistance for maneuvering him on a wheelchair. He is right leg is in an immobilizer brace.       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  - His daughter is the wife of one of my patients.  family history includes Arthritis in his brother and sister; Heart failure in his father; Other in his mother.  Wt Readings from Last 3 Encounters:  No data found for Wt    PHYSICAL EXAM BP 138/74   Pulse 80   Ht 6\' 1"  (1.854 m) ;Unable to weigh because he is in a wheelchair. Physical Exam  Constitutional: He is oriented to person, place, and time. He appears well-nourished. No distress.  Chronically ill-appearing elderly gentleman. In a wheelchair with his right leg extended in a brace. It is internally rotated and almost appears to be dislocated at the hip. He appears to be well groomed and  HENT:  Head: Normocephalic and atraumatic.  Mouth/Throat: Oropharynx is clear and moist. No oropharyngeal exudate.  Eyes: Conjunctivae and EOM are normal. No scleral icterus.  Neck: Normal range of motion. Neck supple. No hepatojugular reflux and no JVD present. Carotid bruit is not present.  Cardiovascular: Normal rate, S1 normal and S2 normal.  An irregularly irregular rhythm present.  Occasional extrasystoles are present. PMI is not displaced.  Exam reveals decreased pulses (Both feet, DP and PT pulses difficult to palpate). Exam reveals no gallop and no friction rub.   No murmur  heard. Pulmonary/Chest: Breath sounds normal. No respiratory distress. He has no wheezes. He has no rales.  Abdominal: Soft. Bowel sounds are normal. He exhibits no distension. There is no tenderness. There is no rebound.  Neurological: He is alert and oriented to person, place, and  time. No cranial nerve deficit. He exhibits abnormal muscle tone.  Skin: Skin is warm and dry.  Bilateral ankles and lower legs have venous stasis changes, but trivial edema.  Psychiatric: He has a normal mood and affect. His behavior is normal. Judgment and thought content normal.     Adult ECG Report  Rate: 80 ;  Rhythm: atrial fibrillation and LVH. Also poor R-wave progression.;   Narrative Interpretation: Relatively stable   Other studies Reviewed: Additional studies/ records that were reviewed today include:  Recent Labs:  CBC June 26,2017 W 6.0, H/H 10.4/32, platelets 185.   ASSESSMENT / PLAN: Problem List Items Addressed This Visit    Essential hypertension (Chronic)    Blood pressures pretty well-controlled accommodation of ARB and Toprol.      Permanent atrial fibrillation (HCC) (Chronic)    Rate controlled. A. fib. Currently on Toprol 50 mg daily. He is on Plavix for stroke prevention. We can discuss the possibility of switching him to a DOAC in the future, but for now would continue Plavix. This would obviously need to be held for surgery (7 days pre-op)  Totally asymptomatic A. fib. Unfortunately don't have any of the prior evaluation with echo or stress test.      Relevant Orders   EKG 12-Lead   Pre-operative cardiovascular examination - Primary    Piers has permanent A. fib that is rate controlled and asymptomatic. He has not had no does he have any heart failure symptoms. No anginal symptoms to suggest CAD. He has never had a stroke and does not have diabetes. He has normal renal function.  Below the hip orthopedic surgery would still be considered low-intermediate risk from a cardiac  standpoint. --> Based on The Revised Cardiac Risk Index, he would be considered low risk for low to intermediate risk surgery. Overall I would not recommend any further cardiac evaluation prior to going forward for surgery.  Recommendations: Hold Plavix 7 days preop. -> Located is restart when safe following surgery, but would be agreeable to low dose DOAC for short term post-op for DVT prophylaxis & CVA prevention.. Hold ARB in the day of surgery to avoid hypotension Take metoprolol on the morning prior to surgery.-- May require IV metoprolol 5 mg every 6 hours postop for A. fib rate control.   Our cardiology service will be available as needed for consultation. Simply call Cards Master for consultation by the [facility] - Team D team.       Relevant Orders   EKG 12-Lead   Venous stasis of both lower extremities (Chronic)    Edema cc pretty well-controlled with current dose of Lasix. Did recommend using support stockings; Also discussed appropriate leg elevation. Really this is a difficult issue at the present state with his leg fracture.         Current medicines are reviewed at length with the patient today. (+/- concerns) n/a The following changes have been made: n/a  Patient Instructions  Continue current medications EXCEPT  FOR  TOTAL KNEE SURGERY - PATIENT IS TO TAKE METOPROLOL THE MORNING OF SURGERY, BUT DO NOT TAKE LOSARTAN THE MORNING OF SURGERY    Clearance for total knee surgery   LOW / INTERMEDIATE  RISK FOR LOW RISK SURGERY MAY STOP PLAVIX 7 DAYS PRIOR TO PRE-SURGERY  IT IS UP TO DR [name] WHETHER ASPIRIN NEEDS TO STOPPED PRIOR TO SURGERY    CARDIOLOGY WILL BE AVAILABLE IF NEEDED PRE DURING AND POST SURGICAL TIMEFRAME  Your physician recommends that you schedule a follow-up appointment in 2 [place].    Studies Ordered:   Orders Placed This Encounter  Procedures  . EKG 12-Lead      Glenetta Hew, M.D., M.S. Interventional  Cardiologist   Pager # [phone] Phone # [phone] [address] [place], [place] [address]

## 2016-12-02 NOTE — Patient Instructions (Signed)
Continue current medications EXCEPT  FOR  TOTAL KNEE SURGERY - PATIENT IS TO TAKE METOPROLOL THE MORNING OF SURGERY, BUT DO NOT TAKE LOSARTAN THE MORNING OF SURGERY    Clearance for total knee surgery   LOW / INTERMEDIATE  RISK FOR LOW RISK SURGERY MAY STOP PLAVIX 7 DAYS PRIOR TO PRE-SURGERY  IT IS UP TO DR [name] WHETHER ASPIRIN NEEDS TO STOPPED PRIOR TO SURGERY    CARDIOLOGY WILL BE AVAILABLE IF NEEDED PRE DURING AND POST SURGICAL TIMEFRAME       Your physician recommends that you schedule a follow-up appointment in 2 MONTHS WITH DR HARDING.

## 2016-12-05 ENCOUNTER — Encounter: Payer: Self-pay | Admitting: Cardiology

## 2016-12-05 DIAGNOSIS — S7291XD Unspecified fracture of right femur, subsequent encounter for closed fracture with routine healing: Secondary | ICD-10-CM | POA: Diagnosis not present

## 2016-12-05 DIAGNOSIS — I10 Essential (primary) hypertension: Secondary | ICD-10-CM | POA: Insufficient documentation

## 2016-12-05 DIAGNOSIS — I4821 Permanent atrial fibrillation: Secondary | ICD-10-CM | POA: Insufficient documentation

## 2016-12-05 DIAGNOSIS — J449 Chronic obstructive pulmonary disease, unspecified: Secondary | ICD-10-CM | POA: Diagnosis not present

## 2016-12-05 DIAGNOSIS — M159 Polyosteoarthritis, unspecified: Secondary | ICD-10-CM | POA: Diagnosis not present

## 2016-12-05 DIAGNOSIS — G7000 Myasthenia gravis without (acute) exacerbation: Secondary | ICD-10-CM | POA: Diagnosis not present

## 2016-12-05 DIAGNOSIS — I878 Other specified disorders of veins: Secondary | ICD-10-CM | POA: Insufficient documentation

## 2016-12-05 DIAGNOSIS — L89152 Pressure ulcer of sacral region, stage 2: Secondary | ICD-10-CM | POA: Diagnosis not present

## 2016-12-05 DIAGNOSIS — Z01810 Encounter for preprocedural cardiovascular examination: Secondary | ICD-10-CM | POA: Insufficient documentation

## 2016-12-05 NOTE — Assessment & Plan Note (Signed)
Edema cc pretty well-controlled with current dose of Lasix. Did recommend using support stockings; Also discussed appropriate leg elevation. Really this is a difficult issue at the present state with his leg fracture.

## 2016-12-05 NOTE — Assessment & Plan Note (Signed)
Rate controlled. A. fib. Currently on Toprol 50 mg daily. He is on Plavix for stroke prevention. We can discuss the possibility of switching him to a DOAC in the future, but for now would continue Plavix. This would obviously need to be held for surgery (7 days pre-op)  Totally asymptomatic A. fib. Unfortunately don't have any of the prior evaluation with echo or stress test.

## 2016-12-05 NOTE — Assessment & Plan Note (Signed)
Baudelio has permanent A. fib that is rate controlled and asymptomatic. He has not had no does he have any heart failure symptoms. No anginal symptoms to suggest CAD. He has never had a stroke and does not have diabetes. He has normal renal function.  Below the hip orthopedic surgery would still be considered low-intermediate risk from a cardiac standpoint. --> Based on The Revised Cardiac Risk Index, he would be considered low risk for low to intermediate risk surgery. Overall I would not recommend any further cardiac evaluation prior to going forward for surgery.  Recommendations: Hold Plavix 7 days preop. -> Located is restart when safe following surgery, but would be agreeable to low dose DOAC for short term post-op for DVT prophylaxis & CVA prevention.. Hold ARB in the day of surgery to avoid hypotension Take metoprolol on the morning prior to surgery.-- May require IV metoprolol 5 mg every 6 hours postop for A. fib rate control.   Our cardiology service will be available as needed for consultation. Simply call Cards Master for consultation by the [facility] - [facility] D team.

## 2016-12-05 NOTE — Assessment & Plan Note (Signed)
Blood pressures pretty well-controlled accommodation of ARB and Toprol.

## 2016-12-07 ENCOUNTER — Other Ambulatory Visit (HOSPITAL_COMMUNITY): Payer: Self-pay | Admitting: Emergency Medicine

## 2016-12-07 NOTE — Progress Notes (Signed)
LOV/cardiac clearance Dr Glenetta Hew 12-02-16 epic  EKG 12-02-16 epic  Clearance Dr [name] on chart

## 2016-12-07 NOTE — Patient Instructions (Addendum)
Travis Ross  12/07/2016   Your procedure is scheduled on: 12-15-16  Report to [facility] Main  Entrance    Report to admitting at 12:30PM   Call this number if you have problems the morning of surgery  [phone]   Remember: ONLY 1 PERSON MAY GO WITH YOU TO SHORT STAY TO GET  READY MORNING OF YOUR SURGERY.  Do not eat food After Midnight. You may have clear liquids from midnight until 9:15AM day of surgery. Nothing my mouth after 9:15AM!!     Take these medicines the morning of surgery with A SIP OF WATER: tylenol as needed, atorvastatin(lipitor), eye drops, inhaler as neeeded, levothyroxine(synthroid), metoprolol, oxycodone as needed, pantoprazole(protonix), sertraline(zoloft)                                  You may not have any metal on your body including hair pins and              piercings  Do not wear jewelry, make-up, lotions, powders or perfumes, deodorant             Men may shave face and neck.   Do not bring valuables to the hospital. [place].  Contacts, dentures or bridgework may not be worn into surgery.  Leave suitcase in the car. After surgery it may be brought to your room.                Please read over the following fact sheets you were given: ____________________________________________________________________   CLEAR LIQUID DIET   Foods Allowed                                                                     Foods Excluded  Coffee and tea, regular and decaf                             liquids that you cannot  Plain Jell-O in any flavor                                             see through such as: Fruit ices (not with fruit pulp)                                     milk, soups, orange juice  Iced Popsicles                                    All solid food Carbonated beverages, regular and diet  Cranberry, grape and apple juices Sports  drinks like Gatorade Lightly seasoned clear broth or consume(fat free) Sugar, honey syrup  Sample Menu Breakfast                                Lunch                                     Supper Cranberry juice                    Beef broth                            Chicken broth Jell-O                                     Grape juice                           Apple juice Coffee or tea                        Jell-O                                      Popsicle                                                Coffee or tea                        Coffee or tea  _____________________________________________________________________    Incentive Spirometer  An incentive spirometer is a tool that can help keep your lungs clear and active. This tool measures how well you are filling your lungs with each breath. Taking long deep breaths may help reverse or decrease the chance of developing breathing (pulmonary) problems (especially infection) following:  A long period of time when you are unable to move or be active. BEFORE THE PROCEDURE   If the spirometer includes an indicator to show your best effort, your nurse or respiratory therapist will set it to a desired goal.  If possible, sit up straight or lean slightly forward. Try not to slouch.  Hold the incentive spirometer in an upright position. INSTRUCTIONS FOR USE  1. Sit on the edge of your bed if possible, or sit up as far as you can in bed or on a chair. 2. Hold the incentive spirometer in an upright position. 3. Breathe out normally. 4. Place the mouthpiece in your mouth and seal your lips tightly around it. 5. Breathe in slowly and as deeply as possible, raising the piston or the ball toward the top of the column. 6. Hold your breath for 3-5 seconds or for as long as possible. Allow the piston or ball to fall to the bottom of the column. 7. Remove the mouthpiece from your mouth and breathe out normally. 8. Rest for a few seconds and  repeat Steps 1 through 7  at least 10 times every 1-2 hours when you are awake. Take your time and take a few normal breaths between deep breaths. 9. The spirometer may include an indicator to show your best effort. Use the indicator as a goal to work toward during each repetition. 10. After each set of 10 deep breaths, practice coughing to be sure your lungs are clear. If you have an incision (the cut made at the time of surgery), support your incision when coughing by placing a pillow or rolled up towels firmly against it. Once you are able to get out of bed, walk around indoors and cough well. You may stop using the incentive spirometer when instructed by your caregiver.  RISKS AND COMPLICATIONS  Take your time so you do not get dizzy or light-headed.  If you are in pain, you may need to take or ask for pain medication before doing incentive spirometry. It is harder to take a deep breath if you are having pain. AFTER USE  Rest and breathe slowly and easily.  It can be helpful to keep track of a log of your progress. Your caregiver can provide you with a simple table to help with this. If you are using the spirometer at home, follow these instructions: [place] IF:   You are having difficultly using the spirometer.  You have trouble using the spirometer as often as instructed.  Your pain medication is not giving enough relief while using the spirometer.  You develop fever of 100.5 F (38.1 C) or higher. SEEK IMMEDIATE MEDICAL CARE IF:   You cough up bloody sputum that had not been present before.  You develop fever of 102 F (38.9 C) or greater.  You develop worsening pain at or near the incision site. MAKE SURE YOU:   Understand these instructions.  Will watch your condition.  Will get help right away if you are not doing well or get worse. Document Released: 06/27/2006 Document Revised: 05/09/2011 Document Reviewed: 08/28/2006 ExitCare Patient Information 2014  ExitCare, [place].   ________________________________________________________________________  WHAT IS A BLOOD TRANSFUSION? Blood Transfusion Information  A transfusion is the replacement of blood or some of its parts. Blood is made up of multiple cells which provide different functions.  Red blood cells carry oxygen and are used for blood loss replacement.  White blood cells fight against infection.  Platelets control bleeding.  Plasma helps clot blood.  Other blood products are available for specialized needs, such as hemophilia or other clotting disorders. BEFORE THE TRANSFUSION  Who gives blood for transfusions?   Healthy volunteers who are fully evaluated to make sure their blood is safe. This is blood bank blood. Transfusion therapy is the safest it has ever been in the practice of medicine. Before blood is taken from a donor, a complete history is taken to make sure that person has no history of diseases nor engages in risky social behavior (examples are intravenous drug use or sexual activity with multiple partners). The donor's travel history is screened to minimize risk of transmitting infections, such as malaria. The donated blood is tested for signs of infectious diseases, such as HIV and hepatitis. The blood is then tested to be sure it is compatible with you in order to minimize the chance of a transfusion reaction. If you or a relative donates blood, this is often done in anticipation of surgery and is not appropriate for emergency situations. It takes many days to process the donated blood. RISKS AND COMPLICATIONS Although transfusion  therapy is very safe and saves many lives, the main dangers of transfusion include:   Getting an infectious disease.  Developing a transfusion reaction. This is an allergic reaction to something in the blood you were given. Every precaution is taken to prevent this. The decision to have a blood transfusion has been considered carefully by your  caregiver before blood is given. Blood is not given unless the benefits outweigh the risks. AFTER THE TRANSFUSION  Right after receiving a blood transfusion, you will usually feel much better and more energetic. This is especially true if your red blood cells have gotten low (anemic). The transfusion raises the level of the red blood cells which carry oxygen, and this usually causes an energy increase.  The nurse administering the transfusion will monitor you carefully for complications. HOME CARE INSTRUCTIONS  No special instructions are needed after a transfusion. You may find your energy is better. Speak with your caregiver about any limitations on activity for underlying diseases you may have. SEEK MEDICAL CARE IF:   Your condition is not improving after your transfusion.  You develop redness or irritation at the intravenous (IV) site. SEEK IMMEDIATE MEDICAL CARE IF:  Any of the following symptoms occur over the next 12 hours:  Shaking chills.  You have a temperature by mouth above 102 F (38.9 C), not controlled by medicine.  Chest, back, or muscle pain.  People around you feel you are not acting correctly or are confused.  Shortness of breath or difficulty breathing.  Dizziness and fainting.  You get a rash or develop hives.  You have a decrease in urine output.  Your urine turns a dark color or changes to pink, red, or brown. Any of the following symptoms occur over the next 10 days:  You have a temperature by mouth above 102 F (38.9 C), not controlled by medicine.  Shortness of breath.  Weakness after normal activity.  The white part of the eye turns yellow (jaundice).  You have a decrease in the amount of urine or are urinating less often.  Your urine turns a dark color or changes to pink, red, or brown. Document Released: 02/12/2000 Document Revised: 05/09/2011 Document Reviewed: 10/01/2007 ExitCare Patient Information 2014 [place].  _______________________________________________________________________  [facility] - Preparing for Surgery Before surgery, you can play an important role.  Because skin is not sterile, your skin needs to be as free of germs as possible.  You can reduce the number of germs on your skin by washing with CHG (chlorahexidine gluconate) soap before surgery.  CHG is an antiseptic cleaner which kills germs and bonds with the skin to continue killing germs even after washing. Please DO NOT use if you have an allergy to CHG or antibacterial soaps.  If your skin becomes reddened/irritated stop using the CHG and inform your nurse when you arrive at Short Stay. Do not shave (including legs and underarms) for at least 48 hours prior to the first CHG shower.  You may shave your face/neck. Please follow these instructions carefully:  1.  Shower with CHG Soap the night before surgery and the  morning of Surgery.  2.  If you choose to wash your hair, wash your hair first as usual with your  normal  shampoo.  3.  After you shampoo, rinse your hair and body thoroughly to remove the  shampoo.  4.  Use CHG as you would any other liquid soap.  You can apply chg directly  to the skin and wash                       Gently with a scrungie or clean washcloth.  5.  Apply the CHG Soap to your body ONLY FROM THE NECK DOWN.   Do not use on face/ open                           Wound or open sores. Avoid contact with eyes, ears mouth and genitals (private parts).                       Wash face,  Genitals (private parts) with your normal soap.             6.  Wash thoroughly, paying special attention to the area where your surgery  will be performed.  7.  Thoroughly rinse your body with warm water from the neck down.  8.  DO NOT shower/wash with your normal soap after using and rinsing off  the CHG Soap.                9.  Pat yourself dry with a clean towel.            10.  Wear clean pajamas.             11.  Place clean sheets on your bed the night of your first shower and do not  sleep with pets. Day of Surgery : Do not apply any lotions/deodorants the morning of surgery.  Please wear clean clothes to the hospital/surgery center.  FAILURE TO FOLLOW THESE INSTRUCTIONS MAY RESULT IN THE CANCELLATION OF YOUR SURGERY PATIENT SIGNATURE_________________________________  NURSE SIGNATURE__________________________________  ________________________________________________________________________

## 2016-12-08 ENCOUNTER — Encounter (HOSPITAL_COMMUNITY)
Admission: RE | Admit: 2016-12-08 | Discharge: 2016-12-08 | Disposition: A | Discharge status: Home / Self Care | Payer: Medicare HMO | Source: Ambulatory Visit | Attending: Orthopedic Surgery | Admitting: Orthopedic Surgery

## 2016-12-08 ENCOUNTER — Encounter (HOSPITAL_COMMUNITY): Payer: Self-pay

## 2016-12-08 ENCOUNTER — Ambulatory Visit: Payer: Self-pay | Admitting: Orthopedic Surgery

## 2016-12-08 DIAGNOSIS — Z01812 Encounter for preprocedural laboratory examination: Secondary | ICD-10-CM | POA: Diagnosis present

## 2016-12-08 DIAGNOSIS — I482 Chronic atrial fibrillation: Secondary | ICD-10-CM | POA: Diagnosis not present

## 2016-12-08 DIAGNOSIS — I10 Essential (primary) hypertension: Secondary | ICD-10-CM | POA: Insufficient documentation

## 2016-12-08 DIAGNOSIS — I878 Other specified disorders of veins: Secondary | ICD-10-CM | POA: Insufficient documentation

## 2016-12-08 HISTORY — DX: Paresthesia of skin: R20.2

## 2016-12-08 LAB — COMPREHENSIVE METABOLIC PANEL
ALT: 15 U/L — ABNORMAL LOW (ref 17–63)
ANION GAP: 10 (ref 5–15)
AST: 21 U/L (ref 15–41)
Albumin: 3.7 g/dL (ref 3.5–5.0)
Alkaline Phosphatase: 139 U/L — ABNORMAL HIGH (ref 38–126)
BILIRUBIN TOTAL: 1.1 mg/dL (ref 0.3–1.2)
BUN: 41 mg/dL — AB (ref 6–20)
CALCIUM: 8.9 mg/dL (ref 8.9–10.3)
CHLORIDE: 101 mmol/L (ref 101–111)
CO2: 27 mmol/L (ref 22–32)
CREATININE: 1.1 mg/dL (ref 0.61–1.24)
GFR calc Af Amer: >60 mL/min (ref >60)
GFR, EST NON AFRICAN AMERICAN: 58 mL/min — AB (ref >60)
Glucose, Bld: 112 mg/dL — ABNORMAL HIGH (ref 65–99)
POTASSIUM: 4 mmol/L (ref 3.5–5.1)
Sodium: 138 mmol/L (ref 135–145)
Total Protein: 6.4 g/dL — ABNORMAL LOW (ref 6.5–8.1)

## 2016-12-08 LAB — CBC
HEMATOCRIT: 38.2 % — AB (ref 39.0–52.0)
Hemoglobin: 11.8 g/dL — ABNORMAL LOW (ref 13.0–17.0)
MCH: 28.9 pg (ref 26.0–34.0)
MCHC: 30.9 g/dL (ref 30.0–36.0)
MCV: 93.4 fL (ref 78.0–100.0)
PLATELETS: 150 10*3/uL (ref 150–400)
RBC: 4.09 MIL/uL — ABNORMAL LOW (ref 4.22–5.81)
RDW: 17.3 % — AB (ref 11.5–15.5)
WBC: 13.3 10*3/uL — AB (ref 4.0–10.5)

## 2016-12-08 LAB — SURGICAL PCR SCREEN
MRSA, PCR: NEGATIVE
STAPHYLOCOCCUS AUREUS: POSITIVE — AB

## 2016-12-08 LAB — APTT: APTT: 34 s (ref 24–36)

## 2016-12-08 LAB — PROTIME-INR
INR: 1.12
Prothrombin Time: 14.3 seconds (ref 11.4–15.2)

## 2016-12-08 LAB — ABO/RH: ABO/RH(D): A POS

## 2016-12-08 NOTE — H&P (Signed)
Corena Pilgrim DOB: 1926/07/07 Widowed / Language: Cleophus Molt / Race: White Male Date of Admission:  12/15/2016 CC:  Nonunion of right femur fracture History of Present Illness The patient is a 83 year old male who comes in  for a preoperative History and Physical. The patient is scheduled for a right total knee revision to be performed by Dr. Dione Plover. [name], MD at [facility] on 12/15/2016. The patient is a 83 year old male who presents with knee complaints. The patient reports right knee symptoms including: pain which began 2 month(s) ago following a specific injury. The injury occurred due to a fall while the patient was at home. Prior to being seen today the patient was previously evaluated by a colleague (Dr. Loney Laurence in [place]; treated conservatively for periprosthetic distal femur fracture). Previous work-up for this problem has included knee x-rays. Past treatment for this problem has included knee brace (TROM. His daughter states that he is not supposed to have any flexion at this point. However, when he was discharged to SNF, the therapist at one point did have the Wilson set at 0-20. The surgeon in [place], then corrected that throught the SNF. He moved here on August 15th, and initially had home PT. He is now at [facility], and has been getting OT and PT there). Current treatment includes opioid analgesics (He states that he takes pain medications due to multi-level injuries in his back. He has had multiple lumbar fusions in the past). Risk factors include total knee replacement (Right TKA in 2003; Left TKA in 2010). Note for "Knee pain": Mr. Quamir Willemsen comes in today accompanied by his daughter [name] for evaluation and second opinion on a right distal periprosthetic femur fracture. Mr Barnhart is an 83 yo male who was living in [place] with one of his daughters at the time of the fracture but was in the process of moving to [place], [place] on the day of the injury. The moving  trucks were loading up the day of the accident. He fell when moving from the bathroom into the next room when he lost his balance and fell between the grab bars and the rollator walker that he has been dependent upon for years. He was transferred to [facility] where he stayed for 10 days and then went into a rehab unit. He was since removed from the rehab unit and brought to [place] to be assisted by his other daughter. They tried to take care of him for some time at home but he is now in the highest level of assisted living at [place]. The patient has an extensive medical history with multiple lumbar fusions with the last back surgery being performed around 2000. He unfortunately developed bilateral foot drops after the surgery which has given him permanent balance issues requiring the use of some time of ambulatory device for mobility. His balance has continued to be an issue and has been on a rollator for the past three years. He has been able to sit and scoot around using both feet to be "rollator mobile" for some time.  He has also been diagnosed recently with a C3 cervical cord compression causing weakness of both upper extremities and bilateral hand weakness. Due to the weakness in his hands, he was unable to hold onto the grab bars at his home in [place] when he fell. He also has myasthenia gravis. This is a nonunion of a fracture that is grossly displaced. Bracing  obviously has not worked. At this stage there is no way that we will be able to do an open reduction internal fixation as he so contracted. I also do not feel that the bone quality would be good enough to heal. We cannot leave it as is as he is completely nonfunctional now. The most predictable way of getting this better is to resect the distal femur and to do a distal femoral replacement. We did discuss this in detail including the procedure risks and potential complications and he would like to  proceed.   Problem List/Past Medical  Status post total right knee replacement (Z96.651)  Periprosthetic fracture of femur at tip of prosthesis, initial encounter (M97.8XXA)  Hypothyroidism  Vitamin D Deficiency  Hypercholesterolemia  Insomnia  Myasthenia Gravis  Glaucoma  Hypertension  Atrial Fibrillation  Gastroesophageal Reflux Disease  Depression  Cardiac Arrhythmia  Chronic Obstructive Lung Disease  Chronic Pain  Osteoarthritis  Peripheral Neuropathy  Skin Cancer    Allergies No Known Drug Allergies  Family History Cerebrovascular Accident  Father. Congestive Heart Failure  Father. First Degree Relatives  reported Hypertension  Mother. Osteoarthritis  Brother, Mother, Sister. child Osteoporosis  Sister.  Social History  Living situation  Lives in a facility. live with caregiver, Assisted Living at [facility] [place]  5 or more Current work status  retired Exercise  Exercises never; does other Former drinker  11/04/2016: In the past drank beer only occasionally per week Marital status  widowed No history of drug/alcohol rehab  Not under pain contract  Number of flights of stairs before winded  less than 1 Tobacco use  Never smoker. 11/04/2016  Medication History Atorvastatin Calcium (10MG  Tablet, Oral) Active. Furosemide (40MG  Tablet, Oral) Active. Levothyroxine Sodium (175MCG Tablet, Oral) Active. Losartan Potassium (50MG  Tablet, Oral) Active. Metoprolol Succinate ER (50MG  Tablet ER 24HR, Oral) Active. MiraLax (Oral) Active. PredniSONE (20MG  Tablet, Oral) Active. Psyllium Capsule Active. Temazepam (30MG  Capsule, Oral) Active. Senna (Oral) Specific strength unknown - Active. Sertraline HCl (100MG  Tablet, Oral) Active. Travatan Z (0.004% Solution, Ophthalmic) Active. Vitamin D3 (Oral) Specific strength unknown - Active. Colace (100MG  Capsule, Oral) Active. Clopidogrel Bisulfate  (75MG  Tablet, Oral) Active. Dulera (100-5MCG/ACT Aerosol, Inhalation) Active. Imuran (50MG  Tablet, Oral) Active. OxyCODONE HCl (10MG  Tablet, Oral) Active. Protonix (40MG  Tablet DR, Oral) Active. Simbrinza (1-0.2% Suspension, Ophthalmic) Active. Acetaminophen (325MG  Tablet, Oral) Active. Magnesium Hydroxide (400MG /5ML Suspension, Oral) Active. OxyCONTIN (30MG  Tab 12HR Deter, Oral) Active.  Past Surgical History  Total Knee Replacement - Right  Gallbladder Surgery  open Spinal Decompression  lower back Spinal Fusion  lower back Spinal Surgery  Total Knee Replacement  bilateral  Review of Systems Skin Present- Skin Color Changes and Ulcer. Not Present- Change in Hair or Nails, Itching, Psoriasis and Rash. HEENT Present- Hearing problems. Not Present- Nose Bleed, Ringing in the Ears and Sensitivity to light. Cardiovascular Present- Palpitations. Not Present- Chest Pain, Leg Cramps, Shortness of Breath and Swelling of Extremities. Gastrointestinal Present- Difficulty Swallowing (difficuly eating due to medical issues). Musculoskeletal Present- Back Pain, Joint Pain, Joint Stiffness and Muscle Weakness. Not Present- Joint Swelling and Muscle Pain. Neurological Present- Numbness and Tingling. Not Present- Burning, Dizziness, Headaches and Tremor. Psychiatric Present- Depression. Not Present- Anxiety and Memory Loss. Endocrine Present- Cold Intolerance. Not Present- Excessive hunger, Excessive Thirst and Heat Intolerance. Hematology Present- Easy Bruising. Not Present- Abnormal Bleeding, Anemia and Blood Clots.  Vitals Height: 73in Pulse: 48 (Irregular)  BP: 121/68 (Sitting, Right Arm, Standard)   Physical Exam  General  Mental Status -Alert, cooperative and good historian. General Appearance-pleasant, Not in acute distress. Orientation-Oriented X3. Build & Nutrition-Well nourished and Well developed.  Head and Neck Head-normocephalic, atraumatic  . Neck Global Assessment - supple, no bruit auscultated on the right, no bruit auscultated on the left.  Eye Pupil - Bilateral-Regular and Round. Motion - Bilateral-EOMI.  Chest and Lung Exam Auscultation Breath sounds - clear at anterior chest wall and clear at posterior chest wall. Adventitious sounds - No Adventitious sounds.  Cardiovascular Auscultation Rhythm - Regular rate and rhythm. Heart Sounds - S1 WNL and S2 WNL. Murmurs & Other Heart Sounds - Auscultation of the heart reveals - No Murmurs.  Abdomen Palpation/Percussion Tenderness - Abdomen is non-tender to palpation. Rigidity (guarding) - Abdomen is soft. Auscultation Auscultation of the abdomen reveals - Bowel sounds normal.  Male Genitourinary Note: Not done, not pertinent to present illness   Musculoskeletal Note: Well-developed male alert and oriented in no apparent distress his RIGHT knee is very swollen and deformed. He keeps it in a flexed position at about 30. He cannot extend it. I did not stress the stability of it given the fracture. He has bilateral foot drops. He has intact pulses in his feet.  Radiographs AP and lateral of the RIGHT knee show a very distal supracondylar periprosthetic fracture which is grossly displaced.   Assessment & Plan  Status post total right knee replacement (I09.735) Periprosthetic fracture of femur at tip of prosthesis, initial encounter (M97.8XXA, [id number])  Note:Surgical Plans: Right Total Knee Revision  Disposition: SNF, Currently is residing in assisted living at [place] One of his daughters has Power of Press photographer.  PCP: Dr. [name] - Patient has been seen preoperatively and felt to be stable for surgery. "Currently has decub ulcer, should be healed by 12/15/16." Cards: Dr. Ellyn Hack - Patient has been seen preoperatively and felt to be stable for surgery. Low - Intermediate Risk for Low Risk Surgery. "Hold Plavix 7d preop. Hold ARB  on day of surgery, but take Metoprolol. Will need IV Metoprolol if unable to take PO..."  IV TXA  Anesthesia Issues: None  Patient was instructed on what medications to stop prior to surgery. He is currently at [facility] and detailed preop instructions were written out on the office visit form that was sent back with the patient. It detailed his NPO status and when to start it. Also provided the above medication instructions that were outlined by the patient's cardiologist.  Signed electronically by Joelene Millin, III PA-C

## 2016-12-08 NOTE — Progress Notes (Signed)
CMP routed via epic to Dr Wynelle Link

## 2016-12-09 DIAGNOSIS — I10 Essential (primary) hypertension: Secondary | ICD-10-CM | POA: Diagnosis not present

## 2016-12-09 DIAGNOSIS — M159 Polyosteoarthritis, unspecified: Secondary | ICD-10-CM | POA: Diagnosis not present

## 2016-12-09 DIAGNOSIS — G7000 Myasthenia gravis without (acute) exacerbation: Secondary | ICD-10-CM | POA: Diagnosis not present

## 2016-12-09 DIAGNOSIS — S7291XD Unspecified fracture of right femur, subsequent encounter for closed fracture with routine healing: Secondary | ICD-10-CM | POA: Diagnosis not present

## 2016-12-09 DIAGNOSIS — J449 Chronic obstructive pulmonary disease, unspecified: Secondary | ICD-10-CM | POA: Diagnosis not present

## 2016-12-09 DIAGNOSIS — L89152 Pressure ulcer of sacral region, stage 2: Secondary | ICD-10-CM | POA: Diagnosis not present

## 2016-12-09 NOTE — Progress Notes (Signed)
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  Notified Wellness Nurse, [place], at [facility] nasal swab results positive for staph at preop appt.  Dr Wynelle Link will need to send faxed prescription to [facility] for Mupirocin ointment.   Called office of Dr Wynelle Link and MD in office today and they will send Dr Wynelle Link a message to fax script in for Mupirocin ointment to [facility].  My phone number given to office as well as fax number for [facility].

## 2016-12-12 DIAGNOSIS — M159 Polyosteoarthritis, unspecified: Secondary | ICD-10-CM | POA: Diagnosis not present

## 2016-12-12 DIAGNOSIS — S7291XD Unspecified fracture of right femur, subsequent encounter for closed fracture with routine healing: Secondary | ICD-10-CM | POA: Diagnosis not present

## 2016-12-12 DIAGNOSIS — G7000 Myasthenia gravis without (acute) exacerbation: Secondary | ICD-10-CM | POA: Diagnosis not present

## 2016-12-12 DIAGNOSIS — J449 Chronic obstructive pulmonary disease, unspecified: Secondary | ICD-10-CM | POA: Diagnosis not present

## 2016-12-12 DIAGNOSIS — L89152 Pressure ulcer of sacral region, stage 2: Secondary | ICD-10-CM | POA: Diagnosis not present

## 2016-12-12 DIAGNOSIS — I10 Essential (primary) hypertension: Secondary | ICD-10-CM | POA: Diagnosis not present

## 2016-12-15 ENCOUNTER — Inpatient Hospital Stay (HOSPITAL_COMMUNITY): Payer: Medicare HMO | Admitting: Certified Registered Nurse Anesthetist

## 2016-12-15 ENCOUNTER — Encounter (HOSPITAL_COMMUNITY)
Admission: RE | Disposition: A | Discharge status: Skilled Nursing Facility | Payer: Self-pay | Source: Ambulatory Visit | Attending: Orthopedic Surgery

## 2016-12-15 ENCOUNTER — Inpatient Hospital Stay (HOSPITAL_COMMUNITY)
Admission: RE | Admit: 2016-12-15 | Discharge: 2016-12-22 | DRG: 467 | Disposition: A | Discharge status: Skilled Nursing Facility | Payer: Medicare HMO | Source: Ambulatory Visit | Attending: Orthopedic Surgery | Admitting: Orthopedic Surgery

## 2016-12-15 ENCOUNTER — Encounter (HOSPITAL_COMMUNITY): Payer: Self-pay

## 2016-12-15 DIAGNOSIS — E559 Vitamin D deficiency, unspecified: Secondary | ICD-10-CM | POA: Diagnosis not present

## 2016-12-15 DIAGNOSIS — E7800 Pure hypercholesterolemia, unspecified: Secondary | ICD-10-CM | POA: Diagnosis not present

## 2016-12-15 DIAGNOSIS — R69 Illness, unspecified: Secondary | ICD-10-CM | POA: Diagnosis not present

## 2016-12-15 DIAGNOSIS — K219 Gastro-esophageal reflux disease without esophagitis: Secondary | ICD-10-CM | POA: Diagnosis not present

## 2016-12-15 DIAGNOSIS — Y92009 Unspecified place in unspecified non-institutional (private) residence as the place of occurrence of the external cause: Secondary | ICD-10-CM | POA: Diagnosis not present

## 2016-12-15 DIAGNOSIS — S8990XA Unspecified injury of unspecified lower leg, initial encounter: Secondary | ICD-10-CM | POA: Diagnosis not present

## 2016-12-15 DIAGNOSIS — M6281 Muscle weakness (generalized): Secondary | ICD-10-CM | POA: Diagnosis not present

## 2016-12-15 DIAGNOSIS — Y792 Prosthetic and other implants, materials and accessory orthopedic devices associated with adverse incidents: Secondary | ICD-10-CM | POA: Diagnosis present

## 2016-12-15 DIAGNOSIS — E785 Hyperlipidemia, unspecified: Secondary | ICD-10-CM | POA: Diagnosis present

## 2016-12-15 DIAGNOSIS — G8918 Other acute postprocedural pain: Secondary | ICD-10-CM | POA: Diagnosis not present

## 2016-12-15 DIAGNOSIS — F329 Major depressive disorder, single episode, unspecified: Secondary | ICD-10-CM | POA: Diagnosis present

## 2016-12-15 DIAGNOSIS — M79661 Pain in right lower leg: Secondary | ICD-10-CM | POA: Diagnosis not present

## 2016-12-15 DIAGNOSIS — J449 Chronic obstructive pulmonary disease, unspecified: Secondary | ICD-10-CM | POA: Diagnosis present

## 2016-12-15 DIAGNOSIS — M9711XA Periprosthetic fracture around internal prosthetic right knee joint, initial encounter: Secondary | ICD-10-CM | POA: Diagnosis present

## 2016-12-15 DIAGNOSIS — G7000 Myasthenia gravis without (acute) exacerbation: Secondary | ICD-10-CM | POA: Diagnosis present

## 2016-12-15 DIAGNOSIS — G8929 Other chronic pain: Secondary | ICD-10-CM | POA: Diagnosis present

## 2016-12-15 DIAGNOSIS — G4700 Insomnia, unspecified: Secondary | ICD-10-CM | POA: Diagnosis not present

## 2016-12-15 DIAGNOSIS — I4892 Unspecified atrial flutter: Secondary | ICD-10-CM | POA: Diagnosis not present

## 2016-12-15 DIAGNOSIS — Z96659 Presence of unspecified artificial knee joint: Secondary | ICD-10-CM

## 2016-12-15 DIAGNOSIS — G629 Polyneuropathy, unspecified: Secondary | ICD-10-CM | POA: Diagnosis present

## 2016-12-15 DIAGNOSIS — I482 Chronic atrial fibrillation: Secondary | ICD-10-CM | POA: Diagnosis not present

## 2016-12-15 DIAGNOSIS — R2681 Unsteadiness on feet: Secondary | ICD-10-CM | POA: Diagnosis not present

## 2016-12-15 DIAGNOSIS — M9711XD Periprosthetic fracture around internal prosthetic right knee joint, subsequent encounter: Secondary | ICD-10-CM | POA: Diagnosis not present

## 2016-12-15 DIAGNOSIS — M25561 Pain in right knee: Secondary | ICD-10-CM | POA: Diagnosis present

## 2016-12-15 DIAGNOSIS — H409 Unspecified glaucoma: Secondary | ICD-10-CM | POA: Diagnosis present

## 2016-12-15 DIAGNOSIS — R41841 Cognitive communication deficit: Secondary | ICD-10-CM | POA: Diagnosis not present

## 2016-12-15 DIAGNOSIS — W19XXXA Unspecified fall, initial encounter: Secondary | ICD-10-CM | POA: Diagnosis present

## 2016-12-15 DIAGNOSIS — E039 Hypothyroidism, unspecified: Secondary | ICD-10-CM | POA: Diagnosis present

## 2016-12-15 DIAGNOSIS — T84018A Broken internal joint prosthesis, other site, initial encounter: Secondary | ICD-10-CM

## 2016-12-15 DIAGNOSIS — S72401A Unspecified fracture of lower end of right femur, initial encounter for closed fracture: Secondary | ICD-10-CM | POA: Diagnosis not present

## 2016-12-15 DIAGNOSIS — L89322 Pressure ulcer of left buttock, stage 2: Secondary | ICD-10-CM | POA: Diagnosis not present

## 2016-12-15 DIAGNOSIS — I10 Essential (primary) hypertension: Secondary | ICD-10-CM | POA: Diagnosis present

## 2016-12-15 DIAGNOSIS — Z96651 Presence of right artificial knee joint: Secondary | ICD-10-CM | POA: Diagnosis not present

## 2016-12-15 DIAGNOSIS — R278 Other lack of coordination: Secondary | ICD-10-CM | POA: Diagnosis not present

## 2016-12-15 DIAGNOSIS — M545 Low back pain: Secondary | ICD-10-CM | POA: Diagnosis not present

## 2016-12-15 DIAGNOSIS — G8911 Acute pain due to trauma: Secondary | ICD-10-CM | POA: Diagnosis not present

## 2016-12-15 DIAGNOSIS — M9712XA Periprosthetic fracture around internal prosthetic left knee joint, initial encounter: Secondary | ICD-10-CM | POA: Diagnosis not present

## 2016-12-15 HISTORY — PX: TOTAL KNEE REVISION: SHX996

## 2016-12-15 LAB — TYPE AND SCREEN
ABO/RH(D): A POS
Antibody Screen: NEGATIVE

## 2016-12-15 SURGERY — TOTAL KNEE REVISION
Anesthesia: General | Site: Knee | Laterality: Right

## 2016-12-15 MED ORDER — DOCUSATE SODIUM 100 MG PO CAPS
100.0000 mg | ORAL_CAPSULE | Freq: Two times a day (BID) | ORAL | Status: DC
  Administered 2016-12-15 – 2016-12-22 (×12): 100 mg via ORAL
  Filled 2016-12-15 (×14): qty 1

## 2016-12-15 MED ORDER — LATANOPROST 0.005 % OP SOLN
1.0000 [drp] | Freq: Every day | OPHTHALMIC | Status: DC
  Filled 2016-12-15: qty 2.5

## 2016-12-15 MED ORDER — DIPHENHYDRAMINE HCL 12.5 MG/5ML PO ELIX: 12.5000 mg | ORAL_SOLUTION | ORAL | Status: DC | PRN

## 2016-12-15 MED ORDER — OXYCODONE HCL 5 MG PO TABS: 30.0000 mg | ORAL_TABLET | Freq: Two times a day (BID) | ORAL | Status: DC

## 2016-12-15 MED ORDER — MENTHOL 3 MG MT LOZG: 1.0000 | LOZENGE | OROMUCOSAL | Status: DC | PRN

## 2016-12-15 MED ORDER — FENTANYL CITRATE (PF) 100 MCG/2ML IJ SOLN
INTRAMUSCULAR | Status: AC
Start: 2016-12-15 — End: 2016-12-15
  Filled 2016-12-15: qty 2

## 2016-12-15 MED ORDER — TEMAZEPAM 15 MG PO CAPS
30.0000 mg | ORAL_CAPSULE | Freq: Every day | ORAL | Status: DC
  Administered 2016-12-15 – 2016-12-21 (×7): 30 mg via ORAL
  Filled 2016-12-15 (×7): qty 2

## 2016-12-15 MED ORDER — METOPROLOL SUCCINATE ER 50 MG PO TB24
50.0000 mg | ORAL_TABLET | Freq: Every day | ORAL | Status: DC
  Administered 2016-12-16 – 2016-12-22 (×7): 50 mg via ORAL
  Filled 2016-12-15 (×7): qty 1

## 2016-12-15 MED ORDER — MIDAZOLAM HCL 2 MG/2ML IJ SOLN
INTRAMUSCULAR | Status: AC
  Filled 2016-12-15: qty 2

## 2016-12-15 MED ORDER — TRAMADOL HCL 50 MG PO TABS
50.0000 mg | ORAL_TABLET | Freq: Four times a day (QID) | ORAL | Status: DC | PRN
  Administered 2016-12-15: 100 mg via ORAL
  Filled 2016-12-15: qty 2

## 2016-12-15 MED ORDER — OXYCODONE HCL 5 MG PO TABS: 5.0000 mg | ORAL_TABLET | ORAL | Status: DC | PRN

## 2016-12-15 MED ORDER — CEFAZOLIN SODIUM-DEXTROSE 2-4 GM/100ML-% IV SOLN
2.0000 g | INTRAVENOUS | Status: AC
  Administered 2016-12-15: 2 g via INTRAVENOUS

## 2016-12-15 MED ORDER — PHENOL 1.4 % MT LIQD: 1.0000 | OROMUCOSAL | Status: DC | PRN

## 2016-12-15 MED ORDER — 0.9 % SODIUM CHLORIDE (POUR BTL) OPTIME
TOPICAL | Status: DC | PRN
  Administered 2016-12-15: 1000 mL

## 2016-12-15 MED ORDER — DEXAMETHASONE SODIUM PHOSPHATE 10 MG/ML IJ SOLN
INTRAMUSCULAR | Status: AC
  Filled 2016-12-15: qty 1

## 2016-12-15 MED ORDER — ATORVASTATIN CALCIUM 10 MG PO TABS
10.0000 mg | ORAL_TABLET | ORAL | Status: DC
  Administered 2016-12-16 – 2016-12-22 (×13): 10 mg via ORAL
  Filled 2016-12-15 (×13): qty 1

## 2016-12-15 MED ORDER — SODIUM CHLORIDE 0.9 % IV BOLUS (SEPSIS)
500.0000 mL | Freq: Once | INTRAVENOUS | Status: AC
  Administered 2016-12-15: 500 mL via INTRAVENOUS

## 2016-12-15 MED ORDER — METHOCARBAMOL 1000 MG/10ML IJ SOLN
500.0000 mg | Freq: Four times a day (QID) | INTRAVENOUS | Status: DC | PRN
  Administered 2016-12-15: 500 mg via INTRAVENOUS
  Filled 2016-12-15: qty 5

## 2016-12-15 MED ORDER — RIVAROXABAN 10 MG PO TABS
10.0000 mg | ORAL_TABLET | Freq: Every day | ORAL | Status: DC
  Administered 2016-12-16 – 2016-12-22 (×7): 10 mg via ORAL
  Filled 2016-12-15 (×7): qty 1

## 2016-12-15 MED ORDER — DEXAMETHASONE SODIUM PHOSPHATE 10 MG/ML IJ SOLN
10.0000 mg | Freq: Once | INTRAMUSCULAR | Status: AC
  Administered 2016-12-16: 10 mg via INTRAVENOUS
  Filled 2016-12-15: qty 1

## 2016-12-15 MED ORDER — TRANEXAMIC ACID 1000 MG/10ML IV SOLN
1000.0000 mg | INTRAVENOUS | Status: AC
  Administered 2016-12-15: 1000 mg via INTRAVENOUS
  Filled 2016-12-15: qty 1100

## 2016-12-15 MED ORDER — LIDOCAINE 2% (20 MG/ML) 5 ML SYRINGE
INTRAMUSCULAR | Status: DC | PRN
  Administered 2016-12-15: 100 mg via INTRAVENOUS

## 2016-12-15 MED ORDER — ONDANSETRON HCL 4 MG/2ML IJ SOLN: 4.0000 mg | Freq: Four times a day (QID) | INTRAMUSCULAR | Status: DC | PRN

## 2016-12-15 MED ORDER — POLYETHYLENE GLYCOL 3350 17 G PO PACK
17.0000 g | PACK | Freq: Every day | ORAL | Status: DC
  Administered 2016-12-16 – 2016-12-22 (×5): 17 g via ORAL
  Filled 2016-12-15 (×6): qty 1

## 2016-12-15 MED ORDER — FIBER 0.52 G PO CAPS: 0.5200 g | ORAL_CAPSULE | Freq: Every day | ORAL | Status: DC

## 2016-12-15 MED ORDER — CALCIUM POLYCARBOPHIL 625 MG PO TABS
625.0000 mg | ORAL_TABLET | Freq: Every day | ORAL | Status: DC
  Administered 2016-12-15: 21:00:00 via ORAL
  Administered 2016-12-16 – 2016-12-21 (×5): 625 mg via ORAL
  Filled 2016-12-15 (×7): qty 1

## 2016-12-15 MED ORDER — LEVOTHYROXINE SODIUM 50 MCG PO TABS
175.0000 ug | ORAL_TABLET | Freq: Every day | ORAL | Status: DC
  Administered 2016-12-16 – 2016-12-22 (×7): 175 ug via ORAL
  Filled 2016-12-15 (×7): qty 1

## 2016-12-15 MED ORDER — EPHEDRINE SULFATE-NACL 50-0.9 MG/10ML-% IV SOSY
PREFILLED_SYRINGE | INTRAVENOUS | Status: DC | PRN
  Administered 2016-12-15 (×2): 15 mg via INTRAVENOUS
  Administered 2016-12-15: 20 mg via INTRAVENOUS

## 2016-12-15 MED ORDER — PREDNISONE 5 MG PO TABS
10.0000 mg | ORAL_TABLET | Freq: Every day | ORAL | Status: DC
  Administered 2016-12-18 – 2016-12-22 (×5): 10 mg via ORAL
  Filled 2016-12-15 (×5): qty 2

## 2016-12-15 MED ORDER — ACETAMINOPHEN 10 MG/ML IV SOLN
INTRAVENOUS | Status: AC
  Filled 2016-12-15: qty 100

## 2016-12-15 MED ORDER — ONDANSETRON HCL 4 MG/2ML IJ SOLN
INTRAMUSCULAR | Status: AC
  Filled 2016-12-15: qty 2

## 2016-12-15 MED ORDER — TRAVOPROST (BAK FREE) 0.004 % OP SOLN
1.0000 [drp] | Freq: Every day | OPHTHALMIC | Status: DC
  Administered 2016-12-15 – 2016-12-21 (×7): 1 [drp] via OPHTHALMIC

## 2016-12-15 MED ORDER — SODIUM CHLORIDE 0.9 % IJ SOLN
INTRAMUSCULAR | Status: DC | PRN
  Administered 2016-12-15: 30 mL

## 2016-12-15 MED ORDER — DEXAMETHASONE SODIUM PHOSPHATE 10 MG/ML IJ SOLN
10.0000 mg | Freq: Once | INTRAMUSCULAR | Status: AC
  Administered 2016-12-15: 10 mg via INTRAVENOUS

## 2016-12-15 MED ORDER — CEFAZOLIN SODIUM-DEXTROSE 2-4 GM/100ML-% IV SOLN
2.0000 g | Freq: Four times a day (QID) | INTRAVENOUS | Status: AC
  Administered 2016-12-15 – 2016-12-16 (×2): 2 g via INTRAVENOUS
  Filled 2016-12-15 (×2): qty 100

## 2016-12-15 MED ORDER — ROPIVACAINE HCL 7.5 MG/ML IJ SOLN
INTRAMUSCULAR | Status: DC | PRN
  Administered 2016-12-15: 20 mL via PERINEURAL

## 2016-12-15 MED ORDER — PROPOFOL 10 MG/ML IV BOLUS
INTRAVENOUS | Status: DC | PRN
  Administered 2016-12-15: 120 mg via INTRAVENOUS

## 2016-12-15 MED ORDER — CHLORHEXIDINE GLUCONATE 4 % EX LIQD: 60.0000 mL | Freq: Once | CUTANEOUS | Status: DC

## 2016-12-15 MED ORDER — BRINZOLAMIDE-BRIMONIDINE 1-0.2 % OP SUSP
1.0000 [drp] | Freq: Two times a day (BID) | OPHTHALMIC | Status: DC
  Administered 2016-12-15 – 2016-12-22 (×14): 1 [drp] via OPHTHALMIC

## 2016-12-15 MED ORDER — CEFAZOLIN SODIUM-DEXTROSE 2-4 GM/100ML-% IV SOLN: 2.0000 g | INTRAVENOUS | Status: DC

## 2016-12-15 MED ORDER — PREDNISONE 20 MG PO TABS
20.0000 mg | ORAL_TABLET | Freq: Every day | ORAL | Status: AC
  Administered 2016-12-16: 20 mg via ORAL
  Filled 2016-12-15: qty 1

## 2016-12-15 MED ORDER — HYDROMORPHONE HCL-NACL 0.5-0.9 MG/ML-% IV SOSY
PREFILLED_SYRINGE | INTRAVENOUS | Status: AC
Start: 2016-12-15 — End: 2016-12-16
  Filled 2016-12-15: qty 2

## 2016-12-15 MED ORDER — FENTANYL CITRATE (PF) 100 MCG/2ML IJ SOLN
INTRAMUSCULAR | Status: DC | PRN
  Administered 2016-12-15: 50 ug via INTRAVENOUS
  Administered 2016-12-15 (×2): 25 ug via INTRAVENOUS

## 2016-12-15 MED ORDER — METHOCARBAMOL 500 MG PO TABS
500.0000 mg | ORAL_TABLET | Freq: Four times a day (QID) | ORAL | Status: DC | PRN
  Administered 2016-12-15 – 2016-12-21 (×4): 500 mg via ORAL
  Filled 2016-12-15 (×5): qty 1

## 2016-12-15 MED ORDER — METOCLOPRAMIDE HCL 5 MG/ML IJ SOLN: 5.0000 mg | Freq: Three times a day (TID) | INTRAMUSCULAR | Status: DC | PRN

## 2016-12-15 MED ORDER — ACETAMINOPHEN 500 MG PO TABS
1000.0000 mg | ORAL_TABLET | Freq: Four times a day (QID) | ORAL | Status: AC
  Administered 2016-12-15 – 2016-12-16 (×3): 1000 mg via ORAL
  Filled 2016-12-15 (×3): qty 2

## 2016-12-15 MED ORDER — LACTATED RINGERS IV SOLN
INTRAVENOUS | Status: DC
  Administered 2016-12-15: 1000 mL via INTRAVENOUS
  Administered 2016-12-15: 16:00:00 via INTRAVENOUS

## 2016-12-15 MED ORDER — OXYCODONE HCL ER 15 MG PO T12A
30.0000 mg | EXTENDED_RELEASE_TABLET | Freq: Two times a day (BID) | ORAL | Status: DC
  Administered 2016-12-15 – 2016-12-22 (×14): 30 mg via ORAL
  Filled 2016-12-15 (×14): qty 2

## 2016-12-15 MED ORDER — SODIUM CHLORIDE 0.9 % IV SOLN
INTRAVENOUS | Status: DC
  Administered 2016-12-15 – 2016-12-20 (×4): via INTRAVENOUS

## 2016-12-15 MED ORDER — MOMETASONE FURO-FORMOTEROL FUM 100-5 MCG/ACT IN AERO
2.0000 | INHALATION_SPRAY | Freq: Two times a day (BID) | RESPIRATORY_TRACT | Status: DC
  Administered 2016-12-15 – 2016-12-22 (×14): 2 via RESPIRATORY_TRACT
  Filled 2016-12-15: qty 8.8

## 2016-12-15 MED ORDER — PROPOFOL 10 MG/ML IV BOLUS
INTRAVENOUS | Status: AC
  Filled 2016-12-15: qty 60

## 2016-12-15 MED ORDER — HYDROMORPHONE HCL-NACL 0.5-0.9 MG/ML-% IV SOSY
0.2500 mg | PREFILLED_SYRINGE | INTRAVENOUS | Status: DC | PRN
  Administered 2016-12-15 (×6): 0.5 mg via INTRAVENOUS

## 2016-12-15 MED ORDER — OXYCODONE HCL 5 MG PO TABS
10.0000 mg | ORAL_TABLET | ORAL | Status: DC | PRN
  Administered 2016-12-15 – 2016-12-21 (×14): 10 mg via ORAL
  Filled 2016-12-15 (×14): qty 2

## 2016-12-15 MED ORDER — LOSARTAN POTASSIUM 50 MG PO TABS
50.0000 mg | ORAL_TABLET | Freq: Every day | ORAL | Status: DC
  Administered 2016-12-17 – 2016-12-20 (×4): 50 mg via ORAL
  Filled 2016-12-15 (×6): qty 1

## 2016-12-15 MED ORDER — ONDANSETRON HCL 4 MG/2ML IJ SOLN
INTRAMUSCULAR | Status: DC | PRN
  Administered 2016-12-15: 4 mg via INTRAVENOUS

## 2016-12-15 MED ORDER — SENNA 8.6 MG PO TABS
1.0000 | ORAL_TABLET | Freq: Every evening | ORAL | Status: DC
  Administered 2016-12-15 – 2016-12-21 (×7): 8.6 mg via ORAL
  Filled 2016-12-15 (×7): qty 1

## 2016-12-15 MED ORDER — SERTRALINE HCL 50 MG PO TABS
150.0000 mg | ORAL_TABLET | Freq: Every day | ORAL | Status: DC
  Administered 2016-12-16 – 2016-12-22 (×7): 150 mg via ORAL
  Filled 2016-12-15 (×7): qty 1

## 2016-12-15 MED ORDER — FENTANYL CITRATE (PF) 100 MCG/2ML IJ SOLN
100.0000 ug | Freq: Once | INTRAMUSCULAR | Status: AC
  Administered 2016-12-15: 50 ug via INTRAVENOUS

## 2016-12-15 MED ORDER — STERILE WATER FOR IRRIGATION IR SOLN
Status: DC | PRN
  Administered 2016-12-15: 2000 mL

## 2016-12-15 MED ORDER — ACETAMINOPHEN 650 MG RE SUPP: 650.0000 mg | RECTAL | Status: DC | PRN

## 2016-12-15 MED ORDER — SODIUM CHLORIDE 0.9 % IJ SOLN
INTRAMUSCULAR | Status: AC
  Filled 2016-12-15: qty 50

## 2016-12-15 MED ORDER — METOCLOPRAMIDE HCL 5 MG PO TABS: 5.0000 mg | ORAL_TABLET | Freq: Three times a day (TID) | ORAL | Status: DC | PRN

## 2016-12-15 MED ORDER — ONDANSETRON HCL 4 MG PO TABS: 4.0000 mg | ORAL_TABLET | Freq: Four times a day (QID) | ORAL | Status: DC | PRN

## 2016-12-15 MED ORDER — BUPIVACAINE LIPOSOME 1.3 % IJ SUSP
20.0000 mL | Freq: Once | INTRAMUSCULAR | Status: DC
  Filled 2016-12-15: qty 20

## 2016-12-15 MED ORDER — MORPHINE SULFATE (PF) 4 MG/ML IV SOLN
1.0000 mg | INTRAVENOUS | Status: DC | PRN
  Administered 2016-12-15 – 2016-12-20 (×4): 1 mg via INTRAVENOUS
  Filled 2016-12-15 (×4): qty 1

## 2016-12-15 MED ORDER — FLEET ENEMA 7-19 GM/118ML RE ENEM: 1.0000 | ENEMA | Freq: Once | RECTAL | Status: DC | PRN

## 2016-12-15 MED ORDER — POLYETHYLENE GLYCOL 3350 17 G PO PACK
17.0000 g | PACK | Freq: Every day | ORAL | Status: DC | PRN
Start: 2016-12-15 — End: 2016-12-22

## 2016-12-15 MED ORDER — PANTOPRAZOLE SODIUM 40 MG PO TBEC
40.0000 mg | DELAYED_RELEASE_TABLET | Freq: Two times a day (BID) | ORAL | Status: DC
  Administered 2016-12-15 – 2016-12-22 (×14): 40 mg via ORAL
  Filled 2016-12-15 (×14): qty 1

## 2016-12-15 MED ORDER — BISACODYL 10 MG RE SUPP: 10.0000 mg | Freq: Every day | RECTAL | Status: DC | PRN

## 2016-12-15 MED ORDER — CEFAZOLIN SODIUM-DEXTROSE 2-4 GM/100ML-% IV SOLN
INTRAVENOUS | Status: AC
  Filled 2016-12-15: qty 100

## 2016-12-15 MED ORDER — FENTANYL CITRATE (PF) 100 MCG/2ML IJ SOLN
INTRAMUSCULAR | Status: AC
  Administered 2016-12-15: 50 ug via INTRAVENOUS
  Filled 2016-12-15: qty 2

## 2016-12-15 MED ORDER — ACETAMINOPHEN 325 MG PO TABS
650.0000 mg | ORAL_TABLET | ORAL | Status: DC | PRN
  Filled 2016-12-15: qty 2

## 2016-12-15 MED ORDER — HYDROMORPHONE HCL-NACL 0.5-0.9 MG/ML-% IV SOSY
PREFILLED_SYRINGE | INTRAVENOUS | Status: AC
  Filled 2016-12-15: qty 3

## 2016-12-15 MED ORDER — ACETAMINOPHEN 10 MG/ML IV SOLN
1000.0000 mg | Freq: Once | INTRAVENOUS | Status: AC
  Administered 2016-12-15: 1000 mg via INTRAVENOUS

## 2016-12-15 MED ORDER — BUPIVACAINE LIPOSOME 1.3 % IJ SUSP
INTRAMUSCULAR | Status: DC | PRN
  Administered 2016-12-15: 20 mL

## 2016-12-15 MED ORDER — FUROSEMIDE 40 MG PO TABS
40.0000 mg | ORAL_TABLET | Freq: Every day | ORAL | Status: DC
  Administered 2016-12-16 – 2016-12-20 (×5): 40 mg via ORAL
  Filled 2016-12-15 (×6): qty 1

## 2016-12-15 MED ORDER — PREDNISONE 5 MG PO TABS
10.0000 mg | ORAL_TABLET | Freq: Two times a day (BID) | ORAL | Status: AC
  Administered 2016-12-17 (×2): 10 mg via ORAL
  Filled 2016-12-15 (×2): qty 2

## 2016-12-15 MED ORDER — HYDROMORPHONE HCL-NACL 0.5-0.9 MG/ML-% IV SOSY
PREFILLED_SYRINGE | INTRAVENOUS | Status: AC
  Filled 2016-12-15: qty 1

## 2016-12-15 SURGICAL SUPPLY — 64 items
BAG DECANTER FOR FLEXI CONT (MISCELLANEOUS) ×2 IMPLANT
BAG ZIPLOCK 12X15 (MISCELLANEOUS) ×2 IMPLANT
BANDAGE ACE 6X5 VEL STRL LF (GAUZE/BANDAGES/DRESSINGS) ×2 IMPLANT
BLADE SAG 18X100X1.27 (BLADE) ×2 IMPLANT
BLADE SAW SGTL 11.0X1.19X90.0M (BLADE) ×2 IMPLANT
BONE CEMENT GENTAMICIN (Cement) ×6 IMPLANT
CEMENT BONE GENTAMICIN 40 (Cement) ×3 IMPLANT
CEMENT RESTRICTOR DEPUY SZ 6 (Cement) ×2 IMPLANT
CLOTH BEACON ORANGE TIMEOUT ST (SAFETY) ×2 IMPLANT
COMPONENT DSTL FEM XX SM RH KN (Insert) ×1 IMPLANT
COVER SURGICAL LIGHT HANDLE (MISCELLANEOUS) ×2 IMPLANT
CUFF TOURN SGL QUICK 34 (TOURNIQUET CUFF) ×1
CUFF TRNQT CYL 34X4X40X1 (TOURNIQUET CUFF) ×1 IMPLANT
DISTAL FEM COMP XX SM RH KNEE (Insert) ×2 IMPLANT
DRAPE SHEET LG 3/4 BI-LAMINATE (DRAPES) ×2 IMPLANT
DRAPE U-SHAPE 47X51 STRL (DRAPES) ×2 IMPLANT
DRSG ADAPTIC 3X8 NADH LF (GAUZE/BANDAGES/DRESSINGS) ×2 IMPLANT
DRSG PAD ABDOMINAL 8X10 ST (GAUZE/BANDAGES/DRESSINGS) ×2 IMPLANT
DURAPREP 26ML APPLICATOR (WOUND CARE) ×2 IMPLANT
ELECT REM PT RETURN 15FT ADLT (MISCELLANEOUS) ×2 IMPLANT
EVACUATOR 1/8 PVC DRAIN (DRAIN) ×2 IMPLANT
GAUZE SPONGE 4X4 12PLY STRL (GAUZE/BANDAGES/DRESSINGS) ×2 IMPLANT
GLOVE BIO SURGEON STRL SZ 6.5 (GLOVE) ×2 IMPLANT
GLOVE BIO SURGEON STRL SZ7.5 (GLOVE) ×2 IMPLANT
GLOVE BIO SURGEON STRL SZ8 (GLOVE) ×4 IMPLANT
GLOVE BIOGEL PI IND STRL 6 (GLOVE) ×1 IMPLANT
GLOVE BIOGEL PI IND STRL 8 (GLOVE) ×2 IMPLANT
GLOVE BIOGEL PI INDICATOR 6 (GLOVE) ×1
GLOVE BIOGEL PI INDICATOR 8 (GLOVE) ×2
GLOVE SURG SS PI 6.5 STRL IVOR (GLOVE) IMPLANT
GOWN STRL REUS W/TWL LRG LVL3 (GOWN DISPOSABLE) ×2 IMPLANT
GOWN STRL REUS W/TWL XL LVL3 (GOWN DISPOSABLE) ×2 IMPLANT
HANDPIECE INTERPULSE COAX TIP (DISPOSABLE) ×1
HINGE TIBIAL INSERT 21 SM KNEE (Insert) ×2 IMPLANT
IMMOBILIZER KNEE 20 (SOFTGOODS) ×2
IMMOBILIZER KNEE 20 THIGH 36 (SOFTGOODS) ×1 IMPLANT
MANIFOLD NEPTUNE II (INSTRUMENTS) ×2 IMPLANT
NS IRRIG 1000ML POUR BTL (IV SOLUTION) ×2 IMPLANT
PACK TOTAL KNEE CUSTOM (KITS) ×2 IMPLANT
PADDING CAST COTTON 6X4 STRL (CAST SUPPLIES) ×4 IMPLANT
POSITIONER SURGICAL ARM (MISCELLANEOUS) ×2 IMPLANT
SET HNDPC FAN SPRY TIP SCT (DISPOSABLE) ×1 IMPLANT
SLEEVE ADAPTER TO LPS PLUS 5 (ADAPTER) ×2 IMPLANT
SLEEVE UNIV FEM FUL PRO SZ46MM (Sleeve) ×2 IMPLANT
STAPLER VISISTAT 35W (STAPLE) ×2 IMPLANT
STEM FLUTED UNIV REV 75X20 (Stem) ×2 IMPLANT
STEM TIBIA PFC 13X30MM (Stem) ×2 IMPLANT
SUT STRATAFIX 0 PDS 27 VIOLET (SUTURE) ×2
SUT VIC AB 2-0 CT1 27 (SUTURE) ×3
SUT VIC AB 2-0 CT1 TAPERPNT 27 (SUTURE) ×3 IMPLANT
SUTURE STRATFX 0 PDS 27 VIOLET (SUTURE) ×1 IMPLANT
SWAB COLLECTION DEVICE MRSA (MISCELLANEOUS) IMPLANT
SWAB CULTURE ESWAB REG 1ML (MISCELLANEOUS) IMPLANT
SYR 50ML LL SCALE MARK (SYRINGE) ×4 IMPLANT
TIP HIGH FLOW IRRIGATION COAX (MISCELLANEOUS) IMPLANT
TOWER CARTRIDGE SMART MIX (DISPOSABLE) ×2 IMPLANT
TRAY FOLEY W/METER SILVER 16FR (SET/KITS/TRAYS/PACK) ×2 IMPLANT
TRAY REVISION SZ 4 (Knees) ×2 IMPLANT
TRAY SLEEVE CEM ML (Knees) ×2 IMPLANT
TUBE KAMVAC SUCTION (TUBING) IMPLANT
WATER STERILE IRR 1500ML POUR (IV SOLUTION) ×4 IMPLANT
WEDGE SIZE 4 5MM (Knees) ×4 IMPLANT
WEDGE SZ 2.0MM 5MM (Knees) IMPLANT
WRAP KNEE MAXI GEL POST OP (GAUZE/BANDAGES/DRESSINGS) ×2 IMPLANT

## 2016-12-15 NOTE — Anesthesia Procedure Notes (Signed)
Anesthesia Regional Block: Adductor canal block   Pre-Anesthetic Checklist: ,, timeout performed, Correct Patient, Correct Site, Correct Laterality, Correct Procedure, Correct Position, site marked, Risks and benefits discussed, pre-op evaluation,  At surgeon's request and post-op pain management  Laterality: Right  Prep: Maximum Sterile Barrier Precautions used, chloraprep       Needles:  Injection technique: Single-shot  Needle Type: Echogenic Stimulator Needle     Needle Length: 9cm  Needle Gauge: 21     Additional Needles:   Procedures:,,,, ultrasound used (permanent image in chart),,,,  Narrative:  Start time: 12/15/2016 1:57 PM End time: 12/15/2016 2:07 PM Injection made incrementally with aspirations every 5 mL.  Performed by: Personally  Anesthesiologist: Roderic Palau  Additional Notes: 2% Lidocaine skin wheel.

## 2016-12-15 NOTE — Brief Op Note (Signed)
12/15/2016  5:32 PM  PATIENT:  Travis Ross  83 y.o. male  PRE-OPERATIVE DIAGNOSIS:  Nonunion Right distal femur periprosthetic fracture   POST-OPERATIVE DIAGNOSIS:  Nonunion Right distal femur periprosthetic fracture   PROCEDURE:  Procedure(s): RIGHT TOTAL KNEE REVISION (Right)  SURGEON:  Surgeon(s) and Role:    Gaynelle Arabian, MD - Primary  PHYSICIAN ASSISTANT:   ASSISTANTS: Arlee Muslim, PA-C   ANESTHESIA:   regional and general  EBL:  100 mL   BLOOD ADMINISTERED:none  DRAINS: (Medium) Hemovact drain(s) in the right knee with  Suction Open   LOCAL MEDICATIONS USED:  OTHER Exparel  COUNTS:  YES  TOURNIQUET:  * Missing tourniquet times found for documented tourniquets in log:  [id number] * Total Tourniquet Time Documented: Thigh (Right) - 89 minutes Total: Thigh (Right) - 89 minutes   DICTATION: .Other Dictation: Dictation Number [id number]  PLAN OF CARE: Admit to inpatient   PATIENT DISPOSITION:  PACU - hemodynamically stable.

## 2016-12-15 NOTE — H&P (View-Only) (Signed)
Travis Ross DOB: 09-17-1926 Widowed / Language: Cleophus Molt / Race: White Male Date of Admission:  12/15/2016 CC:  Nonunion of right femur fracture History of Present Illness The patient is a 83 year old male who comes in  for a preoperative History and Physical. The patient is scheduled for a right total knee revision to be performed by Dr. Dione Plover. [name], MD at [facility] on 12/15/2016. The patient is a 83 year old male who presents with knee complaints. The patient reports right knee symptoms including: pain which began 2 month(s) ago following a specific injury. The injury occurred due to a fall while the patient was at home. Prior to being seen today the patient was previously evaluated by a colleague (Dr. Loney Laurence in [place]; treated conservatively for periprosthetic distal femur fracture). Previous work-up for this problem has included knee x-rays. Past treatment for this problem has included knee brace (TROM. His daughter states that he is not supposed to have any flexion at this point. However, when he was discharged to SNF, the therapist at one point did have the [place] set at 0-20. The surgeon in [place], then corrected that throught the SNF. He moved here on August 15th, and initially had home PT. He is now at [facility], and has been getting OT and PT there). Current treatment includes opioid analgesics (He states that he takes pain medications due to multi-level injuries in his back. He has had multiple lumbar fusions in the past). Risk factors include total knee replacement (Right TKA in 2003; Left TKA in 2010). Note for "Knee pain": Travis Ross comes in today accompanied by his daughter [name] for evaluation and second opinion on a right distal periprosthetic femur fracture. Travis Ross is an 83 yo male who was living in [place] with one of his daughters at the time of the fracture but was in the process of moving to [place], [place] on the day of the injury. The moving  trucks were loading up the day of the accident. He fell when moving from the bathroom into the next room when he lost his balance and fell between the grab bars and the rollator walker that he has been dependent upon for years. He was transferred to [facility] where he stayed for 10 days and then went into a rehab unit. He was since removed from the rehab unit and brought to [facility] to be assisted by his other daughter. They tried to take care of him for some time at home but he is now in the highest level of assisted living at [facility]. The patient has an extensive medical history with multiple lumbar fusions with the last back surgery being performed around 2000. He unfortunately developed bilateral foot drops after the surgery which has given him permanent balance issues requiring the use of some time of ambulatory device for mobility. His balance has continued to be an issue and has been on a rollator for the past three years. He has been able to sit and scoot around using both feet to be "rollator mobile" for some time.  He has also been diagnosed recently with a C3 cervical cord compression causing weakness of both upper extremities and bilateral hand weakness. Due to the weakness in his hands, he was unable to hold onto the grab bars at his home in [place] when he fell. He also has myasthenia gravis. This is a nonunion of a fracture that is grossly displaced. Bracing  obviously has not worked. At this stage there is no way that we will be able to do an open reduction internal fixation as he so contracted. I also do not feel that the bone quality would be good enough to heal. We cannot leave it as is as he is completely nonfunctional now. The most predictable way of getting this better is to resect the distal femur and to do a distal femoral replacement. We did discuss this in detail including the procedure risks and potential complications and he would like to  proceed.   Problem List/Past Medical  Status post total right knee replacement (Z96.651)  Periprosthetic fracture of femur at tip of prosthesis, initial encounter (M97.8XXA)  Hypothyroidism  Vitamin D Deficiency  Hypercholesterolemia  Insomnia  Myasthenia Gravis  Glaucoma  Hypertension  Atrial Fibrillation  Gastroesophageal Reflux Disease  Depression  Cardiac Arrhythmia  Chronic Obstructive Lung Disease  Chronic Pain  Osteoarthritis  Peripheral Neuropathy  Skin Cancer    Allergies No Known Drug Allergies  Family History Cerebrovascular Accident  Father. Congestive Heart Failure  Father. First Degree Relatives  reported Hypertension  Mother. Osteoarthritis  Brother, Mother, Sister. child Osteoporosis  Sister.  Social History  Living situation  Lives in a facility. live with caregiver, Assisted Living at [facility] [place]  5 or more Current work status  retired Exercise  Exercises never; does other Former drinker  11/04/2016: In the past drank beer only occasionally per week Marital status  widowed No history of drug/alcohol rehab  Not under pain contract  Number of flights of stairs before winded  less than 1 Tobacco use  Never smoker. 11/04/2016  Medication History Atorvastatin Calcium (10MG  Tablet, Oral) Active. Furosemide (40MG  Tablet, Oral) Active. Levothyroxine Sodium (175MCG Tablet, Oral) Active. Losartan Potassium (50MG  Tablet, Oral) Active. Metoprolol Succinate ER (50MG  Tablet ER 24HR, Oral) Active. MiraLax (Oral) Active. PredniSONE (20MG  Tablet, Oral) Active. Psyllium Capsule Active. Temazepam (30MG  Capsule, Oral) Active. Senna (Oral) Specific strength unknown - Active. Sertraline HCl (100MG  Tablet, Oral) Active. Travatan Z (0.004% Solution, Ophthalmic) Active. Vitamin D3 (Oral) Specific strength unknown - Active. Colace (100MG  Capsule, Oral) Active. Clopidogrel Bisulfate  (75MG  Tablet, Oral) Active. Dulera (100-5MCG/ACT Aerosol, Inhalation) Active. Imuran (50MG  Tablet, Oral) Active. OxyCODONE HCl (10MG  Tablet, Oral) Active. Protonix (40MG  Tablet DR, Oral) Active. Simbrinza (1-0.2% Suspension, Ophthalmic) Active. Acetaminophen (325MG  Tablet, Oral) Active. Magnesium Hydroxide (400MG /5ML Suspension, Oral) Active. OxyCONTIN (30MG  Tab 12HR Deter, Oral) Active.  Past Surgical History  Total Knee Replacement - Right  Gallbladder Surgery  open Spinal Decompression  lower back Spinal Fusion  lower back Spinal Surgery  Total Knee Replacement  bilateral  Review of Systems Skin Present- Skin Color Changes and Ulcer. Not Present- Change in Hair or Nails, Itching, Psoriasis and Rash. HEENT Present- Hearing problems. Not Present- Nose Bleed, Ringing in the Ears and Sensitivity to light. Cardiovascular Present- Palpitations. Not Present- Chest Pain, Leg Cramps, Shortness of Breath and Swelling of Extremities. Gastrointestinal Present- Difficulty Swallowing (difficuly eating due to medical issues). Musculoskeletal Present- Back Pain, Joint Pain, Joint Stiffness and Muscle Weakness. Not Present- Joint Swelling and Muscle Pain. Neurological Present- Numbness and Tingling. Not Present- Burning, Dizziness, Headaches and Tremor. Psychiatric Present- Depression. Not Present- Anxiety and Memory Loss. Endocrine Present- Cold Intolerance. Not Present- Excessive hunger, Excessive Thirst and Heat Intolerance. Hematology Present- Easy Bruising. Not Present- Abnormal Bleeding, Anemia and Blood Clots.  Vitals Height: 73in Pulse: 48 (Irregular)  BP: 121/68 (Sitting, Right Arm, Standard)   Physical Exam  General  Mental Status -Alert, cooperative and good historian. General Appearance-pleasant, Not in acute distress. Orientation-Oriented X3. Build & Nutrition-Well nourished and Well developed.  Head and Neck Head-normocephalic, atraumatic  . Neck Global Assessment - supple, no bruit auscultated on the right, no bruit auscultated on the left.  Eye Pupil - Bilateral-Regular and Round. Motion - Bilateral-EOMI.  Chest and Lung Exam Auscultation Breath sounds - clear at anterior chest wall and clear at posterior chest wall. Adventitious sounds - No Adventitious sounds.  Cardiovascular Auscultation Rhythm - Regular rate and rhythm. Heart Sounds - S1 WNL and S2 WNL. Murmurs & Other Heart Sounds - Auscultation of the heart reveals - No Murmurs.  Abdomen Palpation/Percussion Tenderness - Abdomen is non-tender to palpation. Rigidity (guarding) - Abdomen is soft. Auscultation Auscultation of the abdomen reveals - Bowel sounds normal.  Male Genitourinary Note: Not done, not pertinent to present illness   Musculoskeletal Note: Well-developed male alert and oriented in no apparent distress his RIGHT knee is very swollen and deformed. He keeps it in a flexed position at about 30. He cannot extend it. I did not stress the stability of it given the fracture. He has bilateral foot drops. He has intact pulses in his feet.  Radiographs AP and lateral of the RIGHT knee show a very distal supracondylar periprosthetic fracture which is grossly displaced.   Assessment & Plan  Status post total right knee replacement (E93.810) Periprosthetic fracture of femur at tip of prosthesis, initial encounter (M97.8XXA, [id number])  Note:Surgical Plans: Right Total Knee Revision  Disposition: SNF, Currently is residing in assisted living at [facility] One of his daughters has Power of Press photographer.  PCP: Dr. [name] - Patient has been seen preoperatively and felt to be stable for surgery. "Currently has decub ulcer, should be healed by 12/15/16." Cards: Dr. Ellyn Hack - Patient has been seen preoperatively and felt to be stable for surgery. Low - Intermediate Risk for Low Risk Surgery. "Hold Plavix 7d preop. Hold ARB  on day of surgery, but take Metoprolol. Will need IV Metoprolol if unable to take PO..."  IV TXA  Anesthesia Issues: None  Patient was instructed on what medications to stop prior to surgery. He is currently at [facility] and detailed preop instructions were written out on the office visit form that was sent back with the patient. It detailed his NPO status and when to start it. Also provided the above medication instructions that were outlined by the patient's cardiologist.  Signed electronically by Joelene Millin, III PA-C

## 2016-12-15 NOTE — Anesthesia Postprocedure Evaluation (Signed)
Anesthesia Post Note  Patient: Travis Ross  Procedure(s) Performed: RIGHT TOTAL KNEE REVISION (Right Knee)     Patient location during evaluation: PACU Anesthesia Type: General and Regional Level of consciousness: awake and alert Pain management: pain level controlled Vital Signs Assessment: post-procedure vital signs reviewed and stable Respiratory status: spontaneous breathing, nonlabored ventilation, respiratory function stable and patient connected to nasal cannula oxygen Cardiovascular status: blood pressure returned to baseline and stable Postop Assessment: no apparent nausea or vomiting Anesthetic complications: no    Last Vitals:  Vitals:   12/15/16 1828 12/15/16 1830  BP: 112/90   Pulse: 98 83  Resp: (!) 21 (!) 26  Temp:    SpO2: 92% 92%    Last Pain:  Vitals:   12/15/16 1820  TempSrc:   PainSc: 5                  Janes Colegrove,W. EDMOND

## 2016-12-15 NOTE — Interval H&P Note (Signed)
History and Physical Interval Note:  12/15/2016 1:20 PM  Travis Ross  has presented today for surgery, with the diagnosis of Nonunion Right distal femur periprosthetic fracture   The various methods of treatment have been discussed with the patient and family. After consideration of risks, benefits and other options for treatment, the patient has consented to  Procedure(s): RIGHT TOTAL KNEE REVISION (Right) as a surgical intervention .  The patient's history has been reviewed, patient examined, no change in status, stable for surgery.  I have reviewed the patient's chart and labs.  Questions were answered to the patient's satisfaction.     Gearlean Alf

## 2016-12-15 NOTE — Anesthesia Procedure Notes (Signed)
Procedure Name: LMA Insertion Date/Time: 12/15/2016 2:23 PM Performed by: Lind Covert Pre-anesthesia Checklist: Patient identified, Emergency Drugs available, Suction available, Patient being monitored and Timeout performed Patient Re-evaluated:Patient Re-evaluated prior to induction Oxygen Delivery Method: Circle system utilized Preoxygenation: Pre-oxygenation with 100% oxygen Induction Type: IV induction LMA: LMA inserted LMA Size: 4.0 Number of attempts: 1 Placement Confirmation: positive ETCO2 and breath sounds checked- equal and bilateral Tube secured with: Tape Dental Injury: Teeth and Oropharynx as per pre-operative assessment

## 2016-12-15 NOTE — Op Note (Signed)
NAME:  Travis Ross, Travis Ross NO.:  [id number]  MEDICAL RECORD NO.:  [id number]  LOCATION:                                 FACILITY:  PHYSICIAN:  Travis Ross, M.D.         DATE OF BIRTH:  DATE OF PROCEDURE:  12/15/2016 DATE OF DISCHARGE:                              OPERATIVE REPORT   PREOPERATIVE DIAGNOSIS:  Nonunion of right distal femoral periprosthetic fracture.  POSTOPERATIVE DIAGNOSIS:  Nonunion of right distal femoral periprosthetic fracture.  PROCEDURE:  Right total knee arthroplasty revision.  SURGEON:  Travis Ross, M.D.  ASSISTANT:  Travis Ross, P.A.C.  ANESTHESIA:  General and adductor canal block.  ESTIMATED BLOOD LOSS:  Minimal.  DRAIN:  Hemovac x1.  TOURNIQUET TIME:  Up 89 minutes at 300 mmHg, down 8 minutes; up additional 24 minutes at 300 mmHg.  COMPLICATIONS:  None.  CONDITION:  Stable to recovery.  BRIEF CLINICAL NOTE:  Travis Ross is an 83 year old male who suffered a right distal femoral periprosthetic fracture at [place] approximately 3 months ago.  I saw him several weeks ago and noted that he had very unstable injury and was in a brace with a nonunited fracture that was not reduced.  At that point, there was no way of reducing and fixing it with hardware.  We discussed that the only viable way of giving him a functional knee again would be to remove that fragment and do a revision total knee arthroplasty with a distal femoral prosthesis.  He presents today for that procedure.  PROCEDURE IN DETAIL:  After successful administration of adductor canal block and then general anesthetic, a tourniquet was placed high on his right thigh and his right lower extremity was prepped and draped in the usual sterile fashion.  Extremities wrapped in Esmarch and tourniquet inflated to 300 mmHg.  Midline incision was made with a 10-blade through the subcutaneous tissue to the level of the extensor mechanism.  A fresh blade was  used to make a medial parapatellar arthrotomy.  Soft tissue on the proximal medial tibia was subperiosteally elevated to the joint line with a knife and into the semimembranosus bursa with a Cobb elevator. Soft tissue laterally was elevated with attention being paid to avoiding the patellar tendon on tibial tubercle.  The patella was everted, knee flexed 90 degrees.  I then placed retractors circumferentially around the tibia and removed the tibial polyethylene.  With an oscillating saw, I then disrupted the interface between the tibial component and bone and removed the tibial component with minimal-to-no bone loss.  We then reamed the tibial canal up to 13 mm for placement of a 30-mm cemented stem.  The extramedullary tibial alignment guide was placed referencing proximally at the medial aspect of the tibial tubercle and distally along the second metatarsal axis and tibial crest.  The block was pinned to remove 2 mm off the cut bone surface.  Tibial resection was made with an oscillating saw.  Size 4 was the most appropriate tibial component. We prepared proximally with the reamer and then also prepared for 29-mm sleeve by broaching to that size.  We then carefully identified the distal femoral fragment.  It was impacted into the distal shaft and was also posterior to the shaft. Fortunately, the metal component was loosened and was removed.  I then had to carefully dissect the bony fragment by subperiosteally elevating the tissue circumferentially around the fragment and then removing the fragment.  This left [name] with the distal femur just at the metaphyseal flare.  I had to cut about a cm or more proximal in order to get back to a fresh bony edge.  We then prepared the femur.  I reamed up to a 20 mm for a press-fit stem.  This had a very tight fit in the femoral canal.  We then broached starting at the small size broaching got up to the 47 broach, which had excellent fit in the distal  metaphyseal region.  We then placed the extra-small body for the distal femoral replacement and that combined with an insert of 18 mm, allowed for full extension and excellent stability throughout full range of motion.  On the tibial side, the trial was a size 4 MBT revision tibia with 29 sleeve and 5-mm augments medial and lateral and 13 x 30 stem extension.  I also placed a size 6 cement restrictor in the tibial canal at the appropriate depth.  All of the trials were intact.  I left this patella intact as the bony shell of patella was thin and the component was barely worn.  Patella tracked normally in the trial.  I did a patelloplasty to remove all the tissue off the patella.  I then released the tourniquet after the initial time of 89 minutes.  There was minimal bleeding in that, which was encountered was stopped with electrocautery.  During the 8 minutes, the tourniquet was held down.  The components were assembled on the back table.  Once again, on the tibial side, an MBT revision tray size 4 with 5-mm augments medial and lateral, a 29-mm sleeve and a 13 x 30-stem extension.  On the femoral side, it was the LPS distal femoral system with an extra-small femur adapter and placed the 47 sleeve and the 20-mm press-fit stem, which was 20 x 75.  All the components were assembled. After 8 minutes, I rewrapped the leg in Esmarch and reinflated the tourniquet to 300 mmHg.  All trials were removed and the cut bone surface was thoroughly irrigated with pulsatile lavage.  The tibial side was cemented in place.  Two batches of gentamicin impregnated cement mixed and then injected into the proximal tibia, and the tibial component cemented and all extruded cement removed.  On the femoral side, it was a porous-coated/press-fit and I impacted the femoral component into the bone with excellent stable purchase.  We then placed an 18-mm trial head with the knee in full extension and removed all  the extruded cement.  Once the cement hardened, the permanent 18-mm hinged polyethylene was placed.  We reduced it into the femoral side and then placed the locking pin to serve as the hinge.  Full extension was achieved with excellent balance throughout full range of motion.  We then placed the Hemovac drain in the joint.  I injected the extensor mechanism, the subcu tissues and the periosteum with total of 20 mL of Exparel mixed with 60 mL of saline.  Wound was copiously irrigated with saline solution and the arthrotomy closed with a running #1 Stratafix suture.  The patella tracks normally and flexion against gravity about 135 degrees.  Tourniquet was released for the second time of  24 minutes. Second limb of the drain was placed in the subcu tissues and subcu closed with interrupted 2-0 Vicryl.  Skin was closed with staples.  The drain was hooked to suction. Incision was cleaned and dried, and a bulky sterile dressing applied. He was then placed into a knee immobilizer, awakened and transported to recovery in stable condition.     Travis Ross, M.D.     FA/MEDQ  D:  12/15/2016  T:  12/15/2016  Job:  [id number]

## 2016-12-15 NOTE — Transfer of Care (Signed)
Immediate Anesthesia Transfer of Care Note  Patient: Travis Ross  Procedure(s) Performed: RIGHT TOTAL KNEE REVISION (Right Knee)  Patient Location: PACU  Anesthesia Type:General  Level of Consciousness: sedated  Airway & Oxygen Therapy: Patient Spontanous Breathing and Patient connected to face mask oxygen  Post-op Assessment: Report given to RN and Post -op Vital signs reviewed and stable  Post vital signs: Reviewed and stable  Last Vitals:  Vitals:   12/15/16 1405 12/15/16 1410  BP: 121/75 125/66  Pulse: 66 66  Resp: 18 11  Temp:    SpO2: 98% 99%    Last Pain:  Vitals:   12/15/16 1306  TempSrc:   PainSc: 5       Patients Stated Pain Goal: 4 (94/58/59 2924)  Complications: No apparent anesthesia complications

## 2016-12-15 NOTE — Progress Notes (Signed)
AssistedDr. Edmond Fitzgerald with right, ultrasound guided, adductor canal block. Side rails up, monitors on throughout procedure. See vital signs in flow sheet. Tolerated Procedure well.  

## 2016-12-15 NOTE — Discharge Instructions (Addendum)
° °Dr. Frank [name] °Total Joint Specialist °[facility] °[address] °[place], [place] [address] °[phone] ° °TOTAL KNEE REPLACEMENT POSTOPERATIVE DIRECTIONS ° °Knee Rehabilitation, Guidelines Following Surgery  °Results after knee surgery are often greatly improved when you follow the exercise, range of motion and muscle strengthening exercises prescribed by your doctor. Safety measures are also important to protect the knee from further injury. Any time any of these exercises cause you to have increased pain or swelling in your knee joint, decrease the amount until you are comfortable again and slowly increase them. If you have problems or questions, call your caregiver or physical therapist for advice.  ° °HOME CARE INSTRUCTIONS  °Remove items at home which could result in a fall. This includes throw rugs or furniture in walking pathways.  °· ICE to the affected knee every three hours for 30 minutes at a time and then as needed for pain and swelling.  Continue to use ice on the knee for pain and swelling from surgery. You may notice swelling that will progress down to the foot and ankle.  This is normal after surgery.  Elevate the leg when you are not up walking on it.   °· Continue to use the breathing machine which will help keep your temperature down.  It is common for your temperature to cycle up and down following surgery, especially at night when you are not up moving around and exerting yourself.  The breathing machine keeps your lungs expanded and your temperature down. °· Do not place pillow under knee, focus on keeping the knee straight while resting ° °DIET °You may resume your previous home diet once your are discharged from the hospital. ° °DRESSING / WOUND CARE / SHOWERING °You may shower 3 days after surgery, but keep the wounds dry during showering.  You may use an occlusive plastic wrap (Press'n Seal for example), NO SOAKING/SUBMERGING IN THE BATHTUB.  If the  bandage gets wet, change with a clean dry gauze.  If the incision gets wet, pat the wound dry with a clean towel. °You may start showering once you are discharged home but do not submerge the incision under water. Just pat the incision dry and apply a dry gauze dressing on daily. °Change the surgical dressing daily and reapply a dry dressing each time. ° °ACTIVITY °Walk with your walker as instructed. °Use walker as long as suggested by your caregivers. °Avoid periods of inactivity such as sitting longer than an hour when not asleep. This helps prevent blood clots.  °You may resume a sexual relationship in one month or when given the OK by your doctor.  °You may return to work once you are cleared by your doctor.  °Do not drive a car for 6 weeks or until released by you surgeon.  °Do not drive while taking narcotics. ° °WEIGHT BEARING °Weight bearing as tolerated with assist device (walker, cane, etc) as directed, use it as long as suggested by your surgeon or therapist, typically at least 4-6 weeks. ° °POSTOPERATIVE CONSTIPATION PROTOCOL °Constipation - defined medically as fewer than three stools per week and severe constipation as less than one stool per week. ° °One of the most common issues patients have following surgery is constipation.  Even if you have a regular bowel pattern at home, your normal regimen is likely to be disrupted due to multiple reasons following surgery.  Combination of anesthesia, postoperative narcotics, change in appetite and fluid intake all can affect your bowels.    In order to avoid complications following surgery, here are some recommendations in order to help you during your recovery period. ° °Colace (docusate) - Pick up an over-the-counter form of Colace or another stool softener and take twice a day as long as you are requiring postoperative pain medications.  Take with a full glass of water daily.  If you experience loose stools or diarrhea, hold the colace until you stool forms  back up.  If your symptoms do not get better within 1 week or if they get worse, check with your doctor. ° °Dulcolax (bisacodyl) - Pick up over-the-counter and take as directed by the product packaging as needed to assist with the movement of your bowels.  Take with a full glass of water.  Use this product as needed if not relieved by Colace only.  ° °MiraLax (polyethylene glycol) - Pick up over-the-counter to have on hand.  MiraLax is a solution that will increase the amount of water in your bowels to assist with bowel movements.  Take as directed and can mix with a glass of water, juice, soda, coffee, or tea.  Take if you go more than two days without a movement. °Do not use MiraLax more than once per day. Call your doctor if you are still constipated or irregular after using this medication for 7 days in a row. ° °If you continue to have problems with postoperative constipation, please contact the office for further assistance and recommendations.  If you experience "the worst abdominal pain ever" or develop nausea or vomiting, please contact the office immediatly for further recommendations for treatment. ° °ITCHING ° If you experience itching with your medications, try taking only a single pain pill, or even half a pain pill at a time.  You can also use Benadryl over the counter for itching or also to help with sleep.  ° °TED HOSE STOCKINGS °Wear the elastic stockings on both legs for three weeks following surgery during the day but you may remove then at night for sleeping. ° °MEDICATIONS °See your medication summary on the “After Visit Summary” that the nursing staff will review with you prior to discharge.  You may have some home medications which will be placed on hold until you complete the course of blood thinner medication.  It is important for you to complete the blood thinner medication as prescribed by your surgeon.  Continue your approved medications as instructed at time of  discharge. ° °PRECAUTIONS °If you experience chest pain or shortness of breath - call 911 immediately for transfer to the hospital emergency department.  °If you develop a fever greater that 101 F, purulent drainage from wound, increased redness or drainage from wound, foul odor from the wound/dressing, or calf pain - CONTACT YOUR SURGEON.   °                                                °FOLLOW-UP APPOINTMENTS °Make sure you keep all of your appointments after your operation with your surgeon and caregivers. You should call the office at the above phone number and make an appointment for approximately two weeks after the date of your surgery or on the date instructed by your surgeon outlined in the "After Visit Summary". ° ° °RANGE OF MOTION AND STRENGTHENING EXERCISES  °Rehabilitation of the knee is important following a knee injury or   an operation. After just a few days of immobilization, the muscles of the thigh which control the knee become weakened and shrink (atrophy). Knee exercises are designed to build up the tone and strength of the thigh muscles and to improve knee motion. Often times heat used for twenty to thirty minutes before working out will loosen up your tissues and help with improving the range of motion but do not use heat for the first two weeks following surgery. These exercises can be done on a training (exercise) mat, on the floor, on a table or on a bed. Use what ever works the best and is most comfortable for you Knee exercises include:  Leg Lifts - While your knee is still immobilized in a splint or cast, you can do straight leg raises. Lift the leg to 60 degrees, hold for 3 sec, and slowly lower the leg. Repeat 10-20 times 2-3 times daily. Perform this exercise against resistance later as your knee gets better.  Quad and Hamstring Sets - Tighten up the muscle on the front of the thigh (Quad) and hold for 5-10 sec. Repeat this 10-20 times hourly. Hamstring sets are done by pushing the  foot backward against an object and holding for 5-10 sec. Repeat as with quad sets.   Leg Slides: Lying on your back, slowly slide your foot toward your buttocks, bending your knee up off the floor (only go as far as is comfortable). Then slowly slide your foot back down until your leg is flat on the floor again.  Angel Wings: Lying on your back spread your legs to the side as far apart as you can without causing discomfort.  A rehabilitation program following serious knee injuries can speed recovery and prevent re-injury in the future due to weakened muscles. Contact your doctor or a physical therapist for more information on knee rehabilitation.   IF YOU ARE TRANSFERRED TO A SKILLED REHAB FACILITY If the patient is transferred to a skilled rehab facility following release from the hospital, a list of the current medications will be sent to the facility for the patient to continue.  When discharged from the skilled rehab facility, please have the facility set up the patient's [place] prior to being released. Also, the skilled facility will be responsible for providing the patient with their medications at time of release from the facility to include their pain medication, the muscle relaxants, and their blood thinner medication. If the patient is still at the rehab facility at time of the two week follow up appointment, the skilled rehab facility will also need to assist the patient in arranging follow up appointment in our office and any transportation needs.  MAKE SURE YOU:  Understand these instructions.  Get help right away if you are not doing well or get worse.    Pick up stool softner and laxative for home use following surgery while on pain medications. Do not submerge incision under water. Please use good hand washing techniques while changing dressing each day. May shower starting three days after surgery. Please use a clean towel to pat the incision dry following  showers. Continue to use ice for pain and swelling after surgery. Do not use any lotions or creams on the incision until instructed by your surgeon.  Resume Plavix at discharge. Take a full dose 325 mg Aspirin daily for three weeks, then reduce to a baby 81 mg Aspirin daily for three additional weeks.

## 2016-12-15 NOTE — Progress Notes (Signed)
May give additional Dilaudid 1 mg in pacu, per Dr Therisa Doyne

## 2016-12-15 NOTE — Anesthesia Preprocedure Evaluation (Addendum)
Anesthesia Evaluation  Patient identified by MRN, date of birth, ID band Patient awake    Reviewed: Allergy & Precautions, H&P , NPO status , Patient's Chart, lab work & pertinent test results  Airway Mallampati: III  TM Distance: >3 FB Neck ROM: Full    Dental no notable dental hx. (+) Dental Advisory Given, Poor Dentition   Pulmonary COPD,    Pulmonary exam normal breath sounds clear to auscultation       Cardiovascular hypertension, Pt. on medications and Pt. on home beta blockers + dysrhythmias Atrial Fibrillation  Rhythm:Irregular Rate:Normal     Neuro/Psych  Neuromuscular disease negative psych ROS   GI/Hepatic Neg liver ROS, GERD  Medicated and Controlled,  Endo/Other  Hypothyroidism   Renal/GU negative Renal ROS  negative genitourinary   Musculoskeletal  (+) Arthritis , Osteoarthritis,    Abdominal   Peds  Hematology negative hematology ROS (+)   Anesthesia Other Findings   Reproductive/Obstetrics negative OB ROS                            Anesthesia Physical Anesthesia Plan  ASA: III  Anesthesia Plan: General   Post-op Pain Management:  Regional for Post-op pain   Induction: Intravenous  PONV Risk Score and Plan: 3 and Ondansetron, Dexamethasone and Midazolam  Airway Management Planned: LMA  Additional Equipment:   Intra-op Plan:   Post-operative Plan: Extubation in OR  Informed Consent: I have reviewed the patients History and Physical, chart, labs and discussed the procedure including the risks, benefits and alternatives for the proposed anesthesia with the patient or authorized representative who has indicated his/her understanding and acceptance.   Dental advisory given  Plan Discussed with: CRNA  Anesthesia Plan Comments:         Anesthesia Quick Evaluation

## 2016-12-16 ENCOUNTER — Encounter (HOSPITAL_COMMUNITY): Payer: Self-pay | Admitting: Orthopedic Surgery

## 2016-12-16 LAB — BASIC METABOLIC PANEL
ANION GAP: 11 (ref 5–15)
BUN: 29 mg/dL — ABNORMAL HIGH (ref 6–20)
CO2: 26 mmol/L (ref 22–32)
Calcium: 8.5 mg/dL — ABNORMAL LOW (ref 8.9–10.3)
Chloride: 98 mmol/L — ABNORMAL LOW (ref 101–111)
Creatinine, Ser: 0.89 mg/dL (ref 0.61–1.24)
GFR calc Af Amer: >60 mL/min (ref >60)
GFR calc non Af Amer: >60 mL/min (ref >60)
GLUCOSE: 140 mg/dL — AB (ref 65–99)
POTASSIUM: 4.6 mmol/L (ref 3.5–5.1)
SODIUM: 135 mmol/L (ref 135–145)

## 2016-12-16 LAB — CBC
HCT: 32.7 % — ABNORMAL LOW (ref 39.0–52.0)
Hemoglobin: 10.1 g/dL — ABNORMAL LOW (ref 13.0–17.0)
MCH: 29 pg (ref 26.0–34.0)
MCHC: 30.9 g/dL (ref 30.0–36.0)
MCV: 94 fL (ref 78.0–100.0)
PLATELETS: 129 10*3/uL — AB (ref 150–400)
RBC: 3.48 MIL/uL — AB (ref 4.22–5.81)
RDW: 17.1 % — AB (ref 11.5–15.5)
WBC: 11.1 10*3/uL — AB (ref 4.0–10.5)

## 2016-12-16 LAB — HEMOGLOBIN AND HEMATOCRIT, BLOOD
HCT: 32.2 % — ABNORMAL LOW (ref 39.0–52.0)
Hemoglobin: 10.2 g/dL — ABNORMAL LOW (ref 13.0–17.0)

## 2016-12-16 MED ORDER — SODIUM CHLORIDE 0.9 % IV BOLUS (SEPSIS)
500.0000 mL | Freq: Once | INTRAVENOUS | Status: AC
  Administered 2016-12-16: 500 mL via INTRAVENOUS

## 2016-12-16 MED ORDER — TRAMADOL HCL 50 MG PO TABS: 50.0000 mg | ORAL_TABLET | Freq: Four times a day (QID) | ORAL | Status: DC | PRN

## 2016-12-16 NOTE — Progress Notes (Signed)
   12/16/16 1500  PT Visit Information  Last PT Received On 12/16/16  Assistance Needed +2  History of Present Illness s/p R TKA revision d/t non-union periprosthetic fx;  medical history with multiple lumbar fusions with the last back surgery, bilateral foot drop after back surgery, balance issues, peripheral neuropathy, afib, HTN  Subjective Data  Patient Stated Goal be able to stand and walk again  Precautions  Precautions Fall;Knee  Required Braces or Orthoses Knee Immobilizer - Right  Knee Immobilizer - Right Discontinue once straight leg raise with < 10 degree lag  Restrictions  Weight Bearing Restrictions No  Pain Assessment  Pain Assessment 0-10  Pain Score 3  Pain Location R knee  Pain Descriptors / Indicators Sore  Pain Intervention(s) Limited activity within patient's tolerance;Monitored during session  Cognition  Arousal/Alertness Awake/alert  Behavior During Therapy WFL for tasks assessed/performed  Overall Cognitive Status No family/caregiver present to determine baseline cognitive functioning  Total Joint Exercises  Ankle Circles/Pumps PROM;5 reps  Quad Sets AROM;Strengthening;Both;10 reps  Short Arc Doretha Imus;Right;10 reps  Heel Slides AAROM;Right;10 reps  Hip ABduction/ADduction AAROM;Both;10 reps;AROM;Strengthening  Straight Leg Raises AROM;AAROM;Strengthening;Both;10 reps  PT - End of Session  Equipment Utilized During Treatment Right knee immobilizer (replaced after ex's)  Activity Tolerance Patient tolerated treatment well  Patient left in chair;with call bell/phone within reach  Nurse Communication Mobility status;Need for lift equipment  PT - Assessment/Plan  PT Plan Current plan remains appropriate  PT Visit Diagnosis Muscle weakness (generalized) (M62.81);History of falling (Z91.81)  PT Frequency (ACUTE ONLY) Min 6X/week  Follow Up Recommendations SNF;Supervision/Assistance - 24 hour  PT equipment None recommended by PT  AM-PAC PT "6 Clicks" Daily  Activity Outcome Measure  Difficulty turning over in bed (including adjusting bedclothes, sheets and blankets)? 1  Difficulty moving from lying on back to sitting on the side of the bed?  1  Difficulty sitting down on and standing up from a chair with arms (e.g., wheelchair, bedside commode, etc,.)? 1  Help needed moving to and from a bed to chair (including a wheelchair)? 1  Help needed walking in hospital room? 1  Help needed climbing 3-5 steps with a railing?  1  6 Click Score 6  Mobility G Code  CN  PT Goal Progression  Progress towards PT goals Progressing toward goals  Acute Rehab PT Goals  PT Goal Formulation With patient  Time For Goal Achievement 12/23/16  Potential to Achieve Goals Good  PT Time Calculation  PT Start Time (ACUTE ONLY) 1421  PT Stop Time (ACUTE ONLY) 1445  PT Time Calculation (min) (ACUTE ONLY) 24 min  PT General Charges  $$ ACUTE PT VISIT 1 Visit  PT Treatments  $Therapeutic Exercise 23-37 mins

## 2016-12-16 NOTE — Evaluation (Signed)
Physical Therapy Evaluation Patient Details Name: Travis Ross MRN: [id number] DOB: [birth year] Today's Date: 12/16/2016   History of Present Illness  s/p R TKA revision d/t non-union periprosthetic fx;  medical history with multiple lumbar fusions with the last back surgery, bilateral foot drop after back surgery, balance issues, peripheral neuropathy, afib, HTN  Clinical Impression  Pt admitted with above diagnosis. Pt currently with functional limitations due to the deficits listed below (see PT Problem List). Pt will benefit from skilled PT to increase their independence and safety with mobility to allow discharge to the venue listed below.  Pt is globally weak and deconditioned, will need extensive rehab in SNF setting to allow to incr independence. Will follow in acute setting     Follow Up Recommendations SNF    Equipment Recommendations  None recommended by PT    Recommendations for Other Services       Precautions / Restrictions Restrictions Weight Bearing Restrictions: No      Mobility  Bed Mobility Overal bed mobility: Needs Assistance Bed Mobility: Supine to Sit     Supine to sit: Min assist;Mod assist     General bed mobility comments: bed pad utilized to assist scooting laterally to EOB prior to sitting, pt with heavy use of rail and trunk assist  Transfers                 General transfer comment: maxisky used, pad placed in sitting  Ambulation/Gait                Stairs            Wheelchair Mobility    Modified Rankin (Stroke Patients Only)       Balance Overall balance assessment: Needs assistance Sitting-balance support: Feet supported;No upper extremity supported Sitting balance-Leahy Scale: Good Sitting balance - Comments: pt able to wt shift for maxisky pad placement       Standing balance comment: unable                             Pertinent Vitals/Pain Pain Assessment: 0-10 Pain Score: 5  Pain  Location: L knee, improved when OOB to none    Home Living Family/patient expects to be discharged to:: Skilled nursing facility                 Additional Comments: from [facility]    Prior Function Level of Independence: Needs assistance   Gait / Transfers Assistance Needed: grossly dependent sliding board transfers after set up (pt able to assist with set up) sits EOB with heay use of rail, incr time at baseline           Hand Dominance        Extremity/Trunk Assessment   Upper Extremity Assessment Upper Extremity Assessment: Defer to OT evaluation    Lower Extremity Assessment Lower Extremity Assessment: RLE deficits/detail;LLE deficits/detail RLE Deficits / Details: foot drop, knee extension contracture prior to surgery--Grossly 0-25* flexion today; strength 2+/5 LLE Deficits / Details: foot drop, generalized weakness       Communication   Communication: No difficulties  Cognition Arousal/Alertness: Awake/alert Behavior During Therapy: WFL for tasks assessed/performed Overall Cognitive Status: No family/caregiver present to determine baseline cognitive functioning                                 General Comments: difficulty with sequence  of events, incr time and repetition of questions needed to get picture of pt functional baseline      General Comments      Exercises     Assessment/Plan    PT Assessment Patient needs continued PT services  PT Problem List Decreased strength;Decreased activity tolerance;Decreased mobility;Decreased knowledge of use of DME;Decreased range of motion       PT Treatment Interventions DME instruction;Gait training;Functional mobility training;Therapeutic activities;Therapeutic exercise;Patient/family education    PT Goals (Current goals can be found in the Care Plan section)  Acute Rehab PT Goals Patient Stated Goal: be able to stand and walk again PT Goal Formulation: With patient Time For  Goal Achievement: 12/23/16 Potential to Achieve Goals: Good    Frequency Min 6X/week   Barriers to discharge        Co-evaluation PT/OT/SLP Co-Evaluation/Treatment: Yes Reason for Co-Treatment: For patient/therapist safety PT goals addressed during session: Mobility/safety with mobility         AM-PAC PT "6 Clicks" Daily Activity  Outcome Measure Difficulty turning over in bed (including adjusting bedclothes, sheets and blankets)?: Unable Difficulty moving from lying on back to sitting on the side of the bed? : Unable Difficulty sitting down on and standing up from a chair with arms (e.g., wheelchair, bedside commode, etc,.)?: Unable Help needed moving to and from a bed to chair (including a wheelchair)?: Total Help needed walking in hospital room?: Total Help needed climbing 3-5 steps with a railing? : Total 6 Click Score: 6    End of Session Equipment Utilized During Treatment: Gait belt Activity Tolerance: Patient tolerated treatment well Patient left: in chair;with call bell/phone within reach Nurse Communication: Mobility status;Need for lift equipment PT Visit Diagnosis: Muscle weakness (generalized) (M62.81);History of falling (Z91.81)    Time: 6301-6010 PT Time Calculation (min) (ACUTE ONLY): 37 min   Charges:   PT Evaluation $PT Eval Moderate Complexity: 1 Mod     PT G CodesKenyon Ana, PT Pager: [phone] 12/16/2016   [facility] 12/16/2016, 10:34 AM

## 2016-12-16 NOTE — Progress Notes (Signed)
Subjective: 1 Day Post-Op Procedure(s) (LRB): RIGHT TOTAL KNEE REVISION (Right) Patient reports pain as mild.   Patient seen in rounds with Dr. Wynelle Link. Patient is well, but has had some minor complaints of pain in the knee, requiring pain medications We will start therapy today. WBAT The patient has an extensive medical history with multiple lumbar fusions with the last back surgery being performed around 2000. He unfortunately developed bilateral foot drops after the surgery which has given him permanent balance issues requiring the use of some time of ambulatory device for mobility. His balance has continued to be an issue and had been on a rollator for the past three years. He has been able to sit and scoot around using both feet to be "rollator mobile" for some time.  Plan is to go Skilled nursing facility after hospital stay.  Objective: Vital signs in last 24 hours: Temp:  [96.3 F (35.7 C)-98.3 F (36.8 C)] 96.4 F (35.8 C) (10/19 0500) Pulse Rate:  [50-105] 54 (10/19 0500) Resp:  [11-27] 20 (10/19 0500) BP: (82-133)/(39-105) 107/54 (10/19 0500) SpO2:  [79 %-99 %] 97 % (10/19 0922) Weight:  [86.2 kg (190 lb)] 86.2 kg (190 lb) (10/18 1306)  Intake/Output from previous day:  Intake/Output Summary (Last 24 hours) at 12/16/16 0927 Last data filed at 12/16/16 0600  Gross per 24 hour  Intake             2880 ml  Output             1465 ml  Net             1415 ml    Intake/Output this shift: No intake/output data recorded.  Labs:  Recent Labs  12/15/16 2357 12/16/16 0550  HGB 10.2* 10.1*    Recent Labs  12/15/16 2357 12/16/16 0550  WBC  --  11.1*  RBC  --  3.48*  HCT 32.2* 32.7*  PLT  --  129*    Recent Labs  12/16/16 0550  NA 135  K 4.6  CL 98*  CO2 26  BUN 29*  CREATININE 0.89  GLUCOSE 140*  CALCIUM 8.5*   No results for input(s): LABPT, INR in the last 72 hours.  EXAM General - Patient is Alert and Appropriate Extremity - Sensation  intact distally Intact pulses distally Dressing - dressing C/D/I Motor Function - intact, moving foot and toes well on exam.  Hemovac pulled without difficulty.  Past Medical History:  Diagnosis Date  . COPD with chronic bronchitis and emphysema ([place])   . Essential hypertension   . Femur fracture, right ([facility])    Related to a fall. Periprosthetic distal femur fracture (close to the right knee prosthesis).  > Initial plans were to treat with brace, however, has now progressed and may require surgery  . GERD (gastroesophageal reflux disease)   . Glaucoma   . Hyperlipidemia   . Hypothyroidism (acquired)    On Levothyroxine  . Myasthenia gravis ([place])    Now (prior to recent femur fracture) was relegated to maneuvering himself around on a Rolling Walker (Rollator) using his feet & sitting on the walker.  Does not have arm strength to mover a wheelchair or transfer.;   . Osteoarthritis    Global osteoarthritis involving back, hips and knees as well as elbows.  . Paresthesia    bilateral upper extremities, very weak ; poor grip  (must use adaptable silverware );   . Permanent atrial fibrillation (HCC)    Long-standing (>  4 yrs). Initially evaluated with Stress Test & Echo - Pt reports that these studies were "normal"    Assessment/Plan: 1 Day Post-Op Procedure(s) (LRB): RIGHT TOTAL KNEE REVISION (Right) Principal Problem:   Periprosthetic fracture around internal prosthetic right knee joint Active Problems:   Failed total knee arthroplasty ([place])  Estimated body mass index is 25.07 kg/m as calculated from the following:   Height as of this encounter: 6\' 1"  (1.854 m).   Weight as of this encounter: 86.2 kg (190 lb). Up with therapy  DVT Prophylaxis - Xarelto Weight-Bearing as tolerated to right leg Balance Issues pre-existing D/C O2 and Pulse OX and try on Room Air  Arlee Muslim, PA-C Orthopaedic Surgery 12/16/2016, 9:27 AM

## 2016-12-16 NOTE — Clinical Social Work Note (Addendum)
Clinical Social Work Assessment  Patient Details  Name: Travis Ross MRN: [id number] Date of Birth: [birth year]  Date of referral:  12/16/16               Reason for consult:  Facility Placement                Permission sought to share information with:  Family Supports, Chartered certified accountant granted to share information::  Yes, Verbal Permission Granted  Name::        Agency::  SNF  Relationship::  Daughters  Contact Information:     Housing/Transportation Living arrangements for the past 2 months:  [facility]) Source of Information:  Patient Patient Interpreter Needed:  None Criminal Activity/Legal Involvement Pertinent to Current Situation/Hospitalization:  No - Comment as needed Significant Relationships:  None Lives with:  Facility Resident Do you feel safe going back to the place where you live?  Yes Need for family participation in patient care:  Yes (Daughter Jeremy Johann)  Care giving concerns:  SNF Placement for ST rehab   Social Worker assessment / plan:  CSW met with the patient at bedside, explain role and reason for visit- to assist with discharge to SNF. Patient a&ox4. Patient reports he is currently living at [facility]. He had a fall while moving  "and refractured a knee already fractured." Patient reports he saw a physician in [place] and then completed short rehab, but the knee "never healed properly. Pateint reports he has been working with PT/OT while at the facility. He reports using a Rolator walker.  CSW explained SNF process, and later providing offers. Patient is well educated and informed about [employer] authorization from prior experience.   The patient reports his children are actively involved in his care. He gave CSW permission to contact his daughter [name] to discuss SNF plan.CSW discussed SNF options in the area in Network with [employer]. Patient daughter express frustrations with [place]  Authorizations process and limited facilities in network. Patient daughter provided a list of facilities to look into.   Plan: Assist w/ discharge to SNF Complete FL2 and PASRR.  Authorization.  Employment status:  Retired Nurse, adult PT Recommendations:  [place] [facility] / Referral to community resources:  [place]  Patient/Family's Response to care:  Agreeable and Responding to care. Appreciative to [facility] services.   Patient/Family's Understanding of and Emotional Response to Diagnosis, Current Treatment, and Prognosis: "I am hoping this time I can rehab well and be able to transition to the home ." Patient very knowledgeable of his care and current treatment.   Emotional Assessment Appearance:  Appears stated age Attitude/Demeanor/Rapport:    Affect (typically observed):  Pleasant, Calm Orientation:  Oriented to Self, Oriented to Place, Oriented to  Time, Oriented to Situation Alcohol / Substance use:  Not Applicable Psych involvement (Current and /or in the community):  No (Comment)  Discharge Needs  Concerns to be addressed:  Discharge Planning Concerns, Care Coordination Readmission within the last 30 days:  No Current discharge risk:  Dependent with Mobility Barriers to Discharge:  Continued Medical Work up, Ship broker, Programmer, applications ([place])   Lia Hopping, LCSW 12/16/2016, 2:14 PM

## 2016-12-16 NOTE — NC FL2 (Signed)
[place] LEVEL OF CARE SCREENING TOOL     IDENTIFICATION  Patient Name: Travis Ross Birthdate: January 31, 1927 Sex: male Admission Date (Current Location): 12/15/2016  [facility] and [place] Number:  Herbalist and Address:  [facility],  [place] [address] [place]      Provider Number: [id number]  Attending Physician Name and Address:  Gaynelle Arabian, MD  Relative Name and Phone Number:       Current Level of Care: SNF Recommended Level of Care: [place] Prior Approval Number:    Date Approved/Denied:   PASRR Number:    Discharge Plan: SNF    Current Diagnoses: Patient Active Problem List   Diagnosis Date Noted  . Periprosthetic fracture around internal prosthetic right knee joint 12/15/2016  . Failed total knee arthroplasty ([place]) 12/15/2016  . Permanent atrial fibrillation ([place]) 12/05/2016  . Pre-operative cardiovascular examination 12/05/2016  . Venous stasis of both lower extremities 12/05/2016  . Essential hypertension     Orientation RESPIRATION BLADDER Height & Weight     Self, Time, Situation, Place  Normal Continent Weight: 190 lb (86.2 kg) Height:  6\' 1"  (185.4 cm)  BEHAVIORAL SYMPTOMS/MOOD NEUROLOGICAL BOWEL NUTRITION STATUS      Continent Diet (Regular)  AMBULATORY STATUS COMMUNICATION OF NEEDS Skin   Extensive Assist Verbally  (Incision Right Knee)                       Personal Care Assistance Level of Assistance  Bathing, Feeding, Dressing Bathing Assistance: Maximum assistance Feeding assistance: Independent Dressing Assistance: Maximum assistance     Functional Limitations Info  Sight, Hearing, Speech Sight Info: Adequate Hearing Info: Adequate Speech Info: Adequate    SPECIAL CARE FACTORS FREQUENCY  PT (By licensed PT), OT (By licensed OT)     PT Frequency: 7x/week  OT Frequency: 7x/week            Contractures Contractures Info: Not present    Additional  Factors Info  Code Status, Allergies, Psychotropic Code Status Info: Fullcode Allergies Info: No Known Allergies Psychotropic Info: zoloft         Current Medications (12/16/2016):  This is the current hospital active medication list Current Facility-Administered Medications  Medication Dose Route Frequency Provider Last Rate Last Dose  . 0.9 %  sodium chloride infusion   Intravenous Continuous Perkins, Alexzandrew L, PA-C 100 mL/hr at 12/16/16 0508    . acetaminophen (TYLENOL) tablet 650 mg  650 mg Oral Q4H PRN [name], Pilar Plate, MD       Or  . acetaminophen (TYLENOL) suppository 650 mg  650 mg Rectal Q4H PRN [name], Pilar Plate, MD      . acetaminophen (TYLENOL) tablet 1,000 mg  1,000 mg Oral Q6H [name], Pilar Plate, MD   1,000 mg at 12/16/16 0505  . atorvastatin (LIPITOR) tablet 10 mg  10 mg Oral Linus Galas, MD   10 mg at 12/16/16 0802  . bisacodyl (DULCOLAX) suppository 10 mg  10 mg Rectal Daily PRN [name], Pilar Plate, MD      . Brinzolamide-Brimonidine 1-0.2 % SUSP 1 drop  1 drop Both Eyes BID Berton Mount, RPH   1 drop at 12/15/16 2101  . dexamethasone (DECADRON) injection 10 mg  10 mg Intravenous Once Gaynelle Arabian, MD      . diphenhydrAMINE (BENADRYL) 12.5 MG/5ML elixir 12.5-25 mg  12.5-25 mg Oral Q4H PRN [name], Pilar Plate, MD      . docusate sodium (COLACE) capsule 100 mg  100 mg Oral BID Gaynelle Arabian, MD   100 mg at 12/15/16 2101  . furosemide (LASIX) tablet 40 mg  40 mg Oral Daily [name], Pilar Plate, MD      . levothyroxine (SYNTHROID, LEVOTHROID) tablet 175 mcg  175 mcg Oral QAC breakfast Gaynelle Arabian, MD   175 mcg at 12/16/16 0802  . losartan (COZAAR) tablet 50 mg  50 mg Oral Daily Perkins, Alexzandrew L, PA-C      . menthol-cetylpyridinium (CEPACOL) lozenge 3 mg  1 lozenge Oral PRN [name], Pilar Plate, MD       Or  . phenol (CHLORASEPTIC) mouth spray 1 spray  1 spray Mouth/Throat PRN [name], Pilar Plate, MD      . methocarbamol (ROBAXIN) tablet 500 mg  500 mg Oral Q6H PRN Gaynelle Arabian, MD   500 mg at 12/15/16 1948   Or  . methocarbamol (ROBAXIN) 500 mg in dextrose 5 % 50 mL IVPB  500 mg Intravenous Q6H PRN Gaynelle Arabian, MD 110 mL/hr at 12/15/16 1805 500 mg at 12/15/16 1805  . metoCLOPramide (REGLAN) tablet 5-10 mg  5-10 mg Oral Q8H PRN Gaynelle Arabian, MD       Or  . metoCLOPramide (REGLAN) injection 5-10 mg  5-10 mg Intravenous Q8H PRN [name], Pilar Plate, MD      . metoprolol succinate (TOPROL-XL) 24 hr tablet 50 mg  50 mg Oral Daily [name], Pilar Plate, MD      . mometasone-formoterol (DULERA) 100-5 MCG/ACT inhaler 2 puff  2 puff Inhalation BID Gaynelle Arabian, MD   2 puff at 12/16/16 0921  . morphine 4 MG/ML injection 1 mg  1 mg Intravenous Q2H PRN Gaynelle Arabian, MD   1 mg at 12/16/16 0246  . ondansetron (ZOFRAN) tablet 4 mg  4 mg Oral Q6H PRN Gaynelle Arabian, MD       Or  . ondansetron (ZOFRAN) injection 4 mg  4 mg Intravenous Q6H PRN [name], Pilar Plate, MD      . oxyCODONE (Oxy IR/ROXICODONE) immediate release tablet 10 mg  10 mg Oral Q3H PRN Gaynelle Arabian, MD   10 mg at 12/16/16 0803  . oxyCODONE (Oxy IR/ROXICODONE) immediate release tablet 5 mg  5 mg Oral Q3H PRN Gaynelle Arabian, MD      . oxyCODONE (OXYCONTIN) 12 hr tablet 30 mg  30 mg Oral Q12H Gaynelle Arabian, MD   30 mg at 12/15/16 2058  . pantoprazole (PROTONIX) EC tablet 40 mg  40 mg Oral BID Gaynelle Arabian, MD   40 mg at 12/15/16 2108  . polycarbophil (FIBERCON) tablet 625 mg  625 mg Oral QHS [name], Pilar Plate, MD      . polyethylene glycol (MIRALAX / GLYCOLAX) packet 17 g  17 g Oral Daily [name], Pilar Plate, MD      . polyethylene glycol (MIRALAX / GLYCOLAX) packet 17 g  17 g Oral Daily PRN Gaynelle Arabian, MD      . Derrill Memo ON 12/17/2016] predniSONE (DELTASONE) tablet 10 mg  10 mg Oral BID WC [name], Pilar Plate, MD      . Derrill Memo ON 12/18/2016] predniSONE (DELTASONE) tablet 10 mg  10 mg Oral Q breakfast [name], Pilar Plate, MD      . rivaroxaban Alveda Reasons) tablet 10 mg  10 mg Oral Q breakfast [name], Pilar Plate, MD      . senna (SENOKOT)  tablet 8.6 mg  1 tablet Oral QPM Gaynelle Arabian, MD   8.6 mg at 12/15/16 2101  . sertraline (ZOLOFT) tablet 150 mg  150 mg Oral Daily Gaynelle Arabian, MD      .  sodium chloride 0.9 % bolus 500 mL  500 mL Intravenous Once Perkins, Alexzandrew L, PA-C 250 mL/hr at 12/16/16 0804 500 mL at 12/16/16 0804  . sodium phosphate (FLEET) 7-19 GM/118ML enema 1 enema  1 enema Rectal Once PRN [name], Pilar Plate, MD      . temazepam (RESTORIL) capsule 30 mg  30 mg Oral QHS Gaynelle Arabian, MD   30 mg at 12/15/16 2101  . traMADol (ULTRAM) tablet 50-100 mg  50-100 mg Oral Q6H PRN [name], Pilar Plate, MD      . Travoprost (BAK Free) (TRAVATAN) 0.004 % ophthalmic solution SOLN 1 drop  1 drop Both Eyes QHS Berton Mount, RPH   1 drop at 12/15/16 2102     Discharge Medications: Please see discharge summary for a list of discharge medications.  Relevant Imaging Results:  Relevant Lab Results:   Additional Information ssn:[id number]  Lia Hopping, LCSW

## 2016-12-16 NOTE — Progress Notes (Signed)
Plan for d/c to SNF, discharge planning per CSW. [phone] 

## 2016-12-16 NOTE — Evaluation (Signed)
Occupational Therapy Evaluation Patient Details Name: Travis Ross MRN: [id number] DOB: 05-Oct-1926 Today's Date: 12/16/2016    History of Present Illness s/p R TKA revision d/t non-union periprosthetic fx;  medical history with multiple lumbar fusions with the last back surgery, bilateral foot drop after back surgery, balance issues, peripheral neuropathy, afib, HTN   Clinical Impression   This 83 year old man was admitted for the above.  He was living in ALF with staff assisting for adls.  Pt states he hasn't walked in 2 years.  Pt presents with bil UE weakness; he has very strong abd muscles and is very motivated.  He will benefit from continued OT.  Will focus on mobility related to adls to decrease burden of care    Follow Up Recommendations  SNF    Equipment Recommendations   (defer to next venue)    Recommendations for Other Services       Precautions / Restrictions Precautions Precautions: Fall;Knee Required Braces or Orthoses: Knee Immobilizer - Right      Mobility Bed Mobility Overal bed mobility: Needs Assistance Bed Mobility: Supine to Sit     Supine to sit: Min assist;Mod assist     General bed mobility comments: bed pad utilized to assist scooting laterally to EOB prior to sitting, pt with heavy use of rail and trunk assist  Transfers                 General transfer comment: maxisky used, pad placed in sitting    Balance Overall balance assessment: Needs assistance Sitting-balance support: Feet supported;No upper extremity supported Sitting balance-Leahy Scale: Good Sitting balance - Comments: pt able to wt shift for maxisky pad placement       Standing balance comment: unable                           ADL either performed or assessed with clinical judgement   ADL Overall ADL's : At baseline                                       General ADL Comments: pt had caregiver assist with adls.  He is able to self  feed after set up using large handled utensils.  He was either using lift or performing sliding board transfers (sounds like [employer]).  Pt hasn't stood/walked x 2 years.     Vision         Perception     Praxis      Pertinent Vitals/Pain Pain Assessment: 0-10 Pain Score: 5  Pain Location: L knee, improved when OOB to none Pain Descriptors / Indicators: Sore Pain Intervention(s): Limited activity within patient's tolerance;Monitored during session;Premedicated before session;Repositioned;Ice applied     Hand Dominance     Extremity/Trunk Assessment Upper Extremity Assessment Upper Extremity Assessment: Generalized weakness;RUE deficits/detail BUE Deficits / Details: pt able to hold built up utensils.  Grip stronger on ulnar side of hand.  Pt reports he drops things often.  Impaired sensation.  Can lift RUE approximately 70 degrees with elbow bent.  LUE approximately 80   Lower Extremity Assessment per PT Lower Extremity Assessment: RLE deficits/detail;LLE deficits/detail RLE Deficits / Details: foot drop, knee extension contracture prior to surgery--Grossly 0-25* flexion today; strength 2+/5 LLE Deficits / Details: foot drop, generalized weakness       Communication Communication Communication: No difficulties  Cognition Arousal/Alertness: Awake/alert Behavior During Therapy: WFL for tasks assessed/performed Overall Cognitive Status: No family/caregiver present to determine baseline cognitive functioning                                 General Comments: difficulty with sequence of events, incr time and repetition of questions needed to get picture of pt functional baseline   General Comments       Exercises     Shoulder Instructions      Home Living Family/patient expects to be discharged to:: Skilled nursing facility                                 Additional Comments: from [facility]F      Prior  Functioning/Environment Level of Independence: Needs assistance  Gait / Transfers Assistance Needed: grossly dependent sliding board transfers after set up (pt able to assist with set up) sits EOB with heay use of rail, incr time at baseline     Comments: has adl assistance        OT Problem List: Decreased strength;Pain;Decreased knowledge of use of DME or AE;Decreased knowledge of precautions;Impaired balance (sitting and/or standing)      OT Treatment/Interventions: Self-care/ADL training;DME and/or AE instruction;Balance training;Patient/family education;Therapeutic activities    OT Goals(Current goals can be found in the care plan section) Acute Rehab OT Goals Patient Stated Goal: be able to stand and walk again OT Goal Formulation: With patient Time For Goal Achievement: 12/23/16 Potential to Achieve Goals: Fair ADL Goals Additional ADL Goal #1: pt will roll to bil sides with mod A for adls Additional ADL Goal #2: pt will go from sit to stand with max +2 assistance for adls   OT Frequency: Min 2X/week   Barriers to D/C:            Co-evaluation PT/OT/SLP Co-Evaluation/Treatment: Yes Reason for Co-Treatment: For patient/therapist safety PT goals addressed during session: Mobility/safety with mobility OT goals addressed during session: ADL's and self-care      AM-PAC PT "6 Clicks" Daily Activity     Outcome Measure Help from another person eating meals?: A Little Help from another person taking care of personal grooming?: A Lot Help from another person toileting, which includes using toliet, bedpan, or urinal?: Total Help from another person bathing (including washing, rinsing, drying)?: A Lot Help from another person to put on and taking off regular upper body clothing?: A Lot Help from another person to put on and taking off regular lower body clothing?: A Lot 6 Click Score: 12   End of Session    Activity Tolerance: Patient tolerated treatment well Patient  left: in chair;with call bell/phone within reach  OT Visit Diagnosis: Pain Pain - Right/Left: Right Pain - part of body: Knee                Time: 1610-9604 OT Time Calculation (min): 35 min Charges:  OT General Charges $OT Visit: 1 Visit OT Evaluation $OT Eval Moderate Complexity: 1 Mod G-Codes:     [place], OTR/L [phone] 12/16/2016  Elia Nunley 12/16/2016, 12:23 PM

## 2016-12-16 NOTE — Progress Notes (Signed)
   12/16/16 1507  PT Visit Information  Last PT Received On 12/16/16  Assistance Needed +2  History of Present Illness s/p R TKA revision d/t non-union periprosthetic fx;  medical history with multiple lumbar fusions with the last back surgery, bilateral foot drop after back surgery, balance issues, peripheral neuropathy, afib, HTN  Subjective Data  Patient Stated Goal be able to stand and walk again  Precautions  Precautions Fall;Knee  Required Braces or Orthoses Knee Immobilizer - Right  Knee Immobilizer - Right Discontinue once straight leg raise with < 10 degree lag  Restrictions  Weight Bearing Restrictions No  Pain Assessment  Pain Assessment 0-10  Pain Score 3  Pain Location R knee  Pain Descriptors / Indicators Sore  Pain Intervention(s) Monitored during session;Ice applied  Cognition  Arousal/Alertness Awake/alert  Behavior During Therapy WFL for tasks assessed/performed  Overall Cognitive Status No family/caregiver present to determine baseline cognitive functioning  Bed Mobility  Bed Mobility Rolling  Rolling Mod assist  General bed mobility comments (assisted NT) pt back to bed with maxisky lift; pt requiring min assist to sit up in bed and remove maxisky pad; mod assist for rolling   PT - End of Session  Equipment Utilized During Treatment Right knee immobilizer  Activity Tolerance Patient tolerated treatment well  Patient left in bed;with call bell/phone within reach;with bed alarm set  Nurse Communication Mobility status;Need for lift equipment  PT - Assessment/Plan  PT Plan Current plan remains appropriate  PT Visit Diagnosis Muscle weakness (generalized) (M62.81);History of falling (Z91.81)  PT Frequency (ACUTE ONLY) Min 6X/week  Follow Up Recommendations SNF;Supervision/Assistance - 24 hour  PT equipment None recommended by PT  AM-PAC PT "6 Clicks" Daily Activity Outcome Measure  Difficulty turning over in bed (including adjusting bedclothes, sheets and  blankets)? 1  Difficulty moving from lying on back to sitting on the side of the bed?  1  Difficulty sitting down on and standing up from a chair with arms (e.g., wheelchair, bedside commode, etc,.)? 1  Help needed moving to and from a bed to chair (including a wheelchair)? 1  Help needed walking in hospital room? 1  Help needed climbing 3-5 steps with a railing?  1  6 Click Score 6  Mobility G Code  CN  PT Goal Progression  Progress towards PT goals Progressing toward goals  Acute Rehab PT Goals  PT Goal Formulation With patient  Time For Goal Achievement 12/23/16  Potential to Achieve Goals Good  PT Time Calculation  PT Start Time (ACUTE ONLY) 1449  PT Stop Time (ACUTE ONLY) 1501  PT Time Calculation (min) (ACUTE ONLY) 12 min  PT General Charges  $$ ACUTE PT VISIT 1 Visit  PT Treatments  $Therapeutic Activity 8-22 mins

## 2016-12-16 NOTE — Addendum Note (Signed)
Addendum  created 12/16/16 0615 by Lind Covert, CRNA   Charge Capture section accepted

## 2016-12-17 LAB — CBC
HCT: 28.5 % — ABNORMAL LOW (ref 39.0–52.0)
Hemoglobin: 9.1 g/dL — ABNORMAL LOW (ref 13.0–17.0)
MCH: 29.7 pg (ref 26.0–34.0)
MCHC: 31.9 g/dL (ref 30.0–36.0)
MCV: 93.1 fL (ref 78.0–100.0)
Platelets: 121 10*3/uL — ABNORMAL LOW (ref 150–400)
RBC: 3.06 MIL/uL — ABNORMAL LOW (ref 4.22–5.81)
RDW: 17.3 % — AB (ref 11.5–15.5)
WBC: 10.6 10*3/uL — ABNORMAL HIGH (ref 4.0–10.5)

## 2016-12-17 LAB — BASIC METABOLIC PANEL
Anion gap: 10 (ref 5–15)
BUN: 29 mg/dL — ABNORMAL HIGH (ref 6–20)
CALCIUM: 8.4 mg/dL — AB (ref 8.9–10.3)
CO2: 26 mmol/L (ref 22–32)
Chloride: 103 mmol/L (ref 101–111)
Creatinine, Ser: 1.02 mg/dL (ref 0.61–1.24)
GFR calc Af Amer: >60 mL/min (ref >60)
GFR calc non Af Amer: >60 mL/min (ref >60)
GLUCOSE: 122 mg/dL — AB (ref 65–99)
Potassium: 4.3 mmol/L (ref 3.5–5.1)
Sodium: 139 mmol/L (ref 135–145)

## 2016-12-17 NOTE — Progress Notes (Signed)
Subjective: 2 Days Post-Op Procedure(s) (LRB): RIGHT TOTAL KNEE REVISION (Right) Patient reports pain as well controlled.  Reports a good night. Tolerating PO's. Denies CP or SOB. Daughter at bedside.  Objective: Vital signs in last 24 hours: Temp:  [98.2 F (36.8 C)-99.1 F (37.3 C)] 98.5 F (36.9 C) (10/20 0540) Pulse Rate:  [78-91] 82 (10/20 0935) Resp:  [16-17] 16 (10/20 0540) BP: (75-142)/(42-77) 125/74 (10/20 0935) SpO2:  [94 %-98 %] 94 % (10/20 0540)  Intake/Output from previous day: 10/19 0701 - 10/20 0700 In: 1622.5 [P.O.:1020; I.V.:602.5] Out: 925 [Urine:925] Intake/Output this shift: Total I/O In: 360 [P.O.:360] Out: 225 [Urine:225]   Recent Labs  12/15/16 2357 12/16/16 0550 12/17/16 0600  HGB 10.2* 10.1* 9.1*    Recent Labs  12/16/16 0550 12/17/16 0600  WBC 11.1* 10.6*  RBC 3.48* 3.06*  HCT 32.7* 28.5*  PLT 129* 121*    Recent Labs  12/16/16 0550 12/17/16 0600  NA 135 139  K 4.6 4.3  CL 98* 103  CO2 26 26  BUN 29* 29*  CREATININE 0.89 1.02  GLUCOSE 140* 122*  CALCIUM 8.5* 8.4*   No results for input(s): LABPT, INR in the last 72 hours.  Well nourished. Alert and oriented x3. RRR, Lungs clear, BS x4. Abdomen soft and non tender. Right Calf soft and non tender. Right knee dressing C/D/I. No DVT signs. Compartment soft. No signs of infection.  Right LE grossly neurovascular intact.  Assessment/Plan: 2 Days Post-Op Procedure(s) (LRB): RIGHT TOTAL KNEE REVISION (Right) Dressing changed PT/OT Pain management Daughter updated SNF at D/c  [name], [name] 12/17/2016, 11:03 AM

## 2016-12-17 NOTE — Progress Notes (Signed)
Physical Therapy Treatment Patient Details Name: Travis Ross MRN: [id number] DOB: 08/24/1926 Today's Date: 12/17/2016    History of Present Illness s/p R TKA revision d/t non-union periprosthetic fx;  medical history with multiple lumbar fusions with the last back surgery, bilateral foot drop after back surgery, balance issues, peripheral neuropathy, afib, HTN    PT Comments    Pt progressing slowly; will certainly need and benefit from SNF placement post acute  Follow Up Recommendations  SNF;Supervision/Assistance - 24 hour     Equipment Recommendations  None recommended by PT    Recommendations for Other Services       Precautions / Restrictions Precautions Precautions: Fall;Knee Required Braces or Orthoses: Knee Immobilizer - Right Knee Immobilizer - Right: Discontinue once straight leg raise with < 10 degree lag Restrictions Weight Bearing Restrictions: No    Mobility  Bed Mobility Overal bed mobility: Needs Assistance       Supine to sit: Min assist;Mod assist     General bed mobility comments: HOB elevated 40*, heavy use of rail, bed pad used to assist scooting to EOB; step by step cues needed for sequencing  Transfers Overall transfer level: Needs assistance   Transfers: Lateral/Scoot Transfers          Lateral/Scoot Transfers: +2 safety/equipment;+2 physical assistance;Max assist General transfer comment: beasy board attempted however unable to get disc to slide/difficult d/t very soft surface of bed and pt has great difficulty wt shifting d/t core weakness   Ambulation/Gait             General Gait Details: NT/pt non-amb prior to adm   Stairs            Wheelchair Mobility    Modified Rankin (Stroke Patients Only)       Balance Overall balance assessment: Needs assistance Sitting-balance support: Feet supported;No upper extremity supported Sitting balance-Leahy Scale: Fair Sitting balance - Comments: pt unable to maintain  balance during wt shifting without assist today                                    Cognition Arousal/Alertness: Awake/alert Behavior During Therapy: WFL for tasks assessed/performed Overall Cognitive Status: No family/caregiver present to determine baseline cognitive functioning                                 General Comments: pt continues to be tangential, requires frequent redirection to current topic and/or task      Exercises Total Joint Exercises Quad Sets: AROM;Strengthening;Both;10 reps Heel Slides: AROM;AAROM;Strengthening;Both;10 reps Straight Leg Raises: AROM;AAROM;Strengthening;Both;10 reps;Limitations Straight Leg Raises Limitations: significant quad lag on R, requires cues to reset quads prior to  each SLR    General Comments        Pertinent Vitals/Pain Pain Assessment: 0-10 Pain Score: 5  Pain Location: R knee Pain Descriptors / Indicators: Sore Pain Intervention(s): Monitored during session    Home Living                      Prior Function            PT Goals (current goals can now be found in the care plan section) Acute Rehab PT Goals Patient Stated Goal: be able to stand and walk again PT Goal Formulation: With patient Time For Goal Achievement: 12/23/16 Potential to Achieve Goals: Good Progress towards  PT goals: Progressing toward goals    Frequency    Min 6X/week      PT Plan Current plan remains appropriate    Co-evaluation              AM-PAC PT "6 Clicks" Daily Activity  Outcome Measure  Difficulty turning over in bed (including adjusting bedclothes, sheets and blankets)?: Unable Difficulty moving from lying on back to sitting on the side of the bed? : Unable Difficulty sitting down on and standing up from a chair with arms (e.g., wheelchair, bedside commode, etc,.)?: Unable Help needed moving to and from a bed to chair (including a wheelchair)?: Total Help needed walking in hospital  room?: Total Help needed climbing 3-5 steps with a railing? : Total 6 Click Score: 6    End of Session Equipment Utilized During Treatment: Right knee immobilizer Activity Tolerance: Patient tolerated treatment well;Patient limited by fatigue Patient left: in chair;with call bell/phone within reach (on maxisky pad)   PT Visit Diagnosis: Muscle weakness (generalized) (M62.81);History of falling (Z91.81)     Time: 1237-1300 PT Time Calculation (min) (ACUTE ONLY): 23 min  Charges:  $Therapeutic Activity: 23-37 mins                    G Codes:          Riddhi Grether 2017/01/02, 1:25 PM

## 2016-12-18 LAB — CBC
HCT: 27.9 % — ABNORMAL LOW (ref 39.0–52.0)
HEMOGLOBIN: 8.8 g/dL — AB (ref 13.0–17.0)
MCH: 28.9 pg (ref 26.0–34.0)
MCHC: 31.5 g/dL (ref 30.0–36.0)
MCV: 91.8 fL (ref 78.0–100.0)
Platelets: 117 10*3/uL — ABNORMAL LOW (ref 150–400)
RBC: 3.04 MIL/uL — ABNORMAL LOW (ref 4.22–5.81)
RDW: 17.3 % — AB (ref 11.5–15.5)
WBC: 10 10*3/uL (ref 4.0–10.5)

## 2016-12-18 MED ORDER — ALBUTEROL SULFATE (2.5 MG/3ML) 0.083% IN NEBU
INHALATION_SOLUTION | RESPIRATORY_TRACT | Status: AC
  Filled 2016-12-18: qty 3

## 2016-12-18 NOTE — Progress Notes (Signed)
Physical Therapy Treatment Patient Details Name: Travis Ross MRN: [id number] DOB: 1927/02/11 Today's Date: 12/18/2016    History of Present Illness s/p R TKA revision d/t non-union periprosthetic fx;  medical history with multiple lumbar fusions with the last back surgery, bilateral foot drop after back surgery, balance issues, peripheral neuropathy, afib, HTN    PT Comments    Continue to recommend SNF   Follow Up Recommendations  SNF;Supervision/Assistance - 24 hour     Equipment Recommendations  None recommended by PT    Recommendations for Other Services       Precautions / Restrictions Precautions Precautions: Fall;Knee Required Braces or Orthoses: Knee Immobilizer - Right Knee Immobilizer - Right: Discontinue once straight leg raise with < 10 degree lag Restrictions Weight Bearing Restrictions: No Other Position/Activity Restrictions: WBAT    Mobility  Bed Mobility               General bed mobility comments:  (offerred OOB, pt declined d/t needing to use bed pan)  Transfers                    Ambulation/Gait                 Stairs            Wheelchair Mobility    Modified Rankin (Stroke Patients Only)       Balance                                            Cognition Arousal/Alertness: Awake/alert Behavior During Therapy: WFL for tasks assessed/performed Overall Cognitive Status: No family/caregiver present to determine baseline cognitive functioning Area of Impairment: Following commands;Problem solving;Safety/judgement                       Following Commands: Follows one step commands inconsistently Safety/Judgement: Decreased awareness of deficits   Problem Solving: Difficulty sequencing;Requires verbal cues;Requires tactile cues General Comments: continues to be tangential in speech and thoughts, likes to talk about his family getting people fired in their own lines of work;  pt has  difficulty following one step commands for exercises and requires frequent redirection, repetition and reiteration of instructions      Exercises Total Joint Exercises Ankle Circles/Pumps: PROM;5 reps Quad Sets: AROM;Strengthening;Both;10 reps Short Arc QuadSinclair Ross;Both;10 reps Heel Slides: AROM;AAROM;Strengthening;Both;10 reps Hip ABduction/ADduction: AAROM;Both;10 reps;AROM;Strengthening Straight Leg Raises: AROM;AAROM;Strengthening;Both;10 reps;Limitations Straight Leg Raises Limitations: significant quad lag on R, requires cues to reset quads prior to  each SLR    General Comments        Pertinent Vitals/Pain Pain Assessment: Faces Faces Pain Scale: Hurts a little bit Pain Location: R knee Pain Descriptors / Indicators: Sore Pain Intervention(s): Monitored during session    Home Living                      Prior Function            PT Goals (current goals can now be found in the care plan section) Acute Rehab PT Goals Patient Stated Goal: be able to use rollator  PT Goal Formulation: With patient Time For Goal Achievement: 12/23/16 Potential to Achieve Goals: Good Progress towards PT goals: Progressing toward goals    Frequency    Min 6X/week      PT Plan Current plan remains appropriate  Co-evaluation              AM-PAC PT "6 Clicks" Daily Activity  Outcome Measure  Difficulty turning over in bed (including adjusting bedclothes, sheets and blankets)?: Unable Difficulty moving from lying on back to sitting on the side of the bed? : Unable Difficulty sitting down on and standing up from a chair with arms (e.g., wheelchair, bedside commode, etc,.)?: Unable Help needed moving to and from a bed to chair (including a wheelchair)?: Total Help needed walking in hospital room?: Total Help needed climbing 3-5 steps with a railing? : Total 6 Click Score: 6    End of Session   Activity Tolerance: Patient tolerated treatment well;Patient  limited by fatigue Patient left: in bed;with call bell/phone within reach   PT Visit Diagnosis: Muscle weakness (generalized) (M62.81);History of falling (Z91.81)     Time: 1410-1435 PT Time Calculation (min) (ACUTE ONLY): 25 min  Charges:  $Therapeutic Exercise: 23-37 mins                    G Codes:          Travis Ross 2016-12-23, 3:43 PM

## 2016-12-18 NOTE — Progress Notes (Signed)
Subjective: 3 Days Post-Op Procedure(s) (LRB): RIGHT TOTAL KNEE REVISION (Right)  Patient reports pain as mild to moderate.  Tolerating POs well.  Admits to flatus.  Denies fever, chills, N/V, CP, SOB.    Objective:   VITALS:  Temp:  [97.6 F (36.4 C)-99 F (37.2 C)] 97.6 F (36.4 C) (10/21 0556) Pulse Rate:  [56-91] 78 (10/21 0556) Resp:  [20] 20 (10/21 0556) BP: (108-132)/(58-85) 132/79 (10/21 0556) SpO2:  [95 %-97 %] 95 % (10/21 0742)  General: WDWN patient in NAD. Psych:  Appropriate mood and affect. Neuro:  A&O x 3, Moving all extremities, sensation intact to light touch HEENT:  EOMs intact Chest:  Even non-labored respirations Skin: Incision C/D/I, no rashes or lesions Extremities: warm/dry, mild edema, no erythema or echymosis.  No lymphadenopathy. Pulses: Dorsalis pedis and post tibialis 2+ MSK:  ROM: patient lacks 5 degrees of TKE, MMT: able to perform quad set, (-) Homan's    LABS  Recent Labs  12/15/16 2357  12/16/16 0550 12/17/16 0600 12/18/16 0522  HGB 10.2*  --  10.1* 9.1* 8.8*  WBC  --   < > 11.1* 10.6* 10.0  PLT  --   < > 129* 121* 117*  < > = values in this interval not displayed.  Recent Labs  12/16/16 0550 12/17/16 0600  NA 135 139  K 4.6 4.3  CL 98* 103  CO2 26 26  BUN 29* 29*  CREATININE 0.89 1.02  GLUCOSE 140* 122*   No results for input(s): LABPT, INR in the last 72 hours.   Assessment/Plan: 3 Days Post-Op Procedure(s) (LRB): RIGHT TOTAL KNEE REVISION (Right)  WBAT RLE Dressing changed. Up with therapy Plan for SNF at D/C Plan for outpatient post-op visit with Dr. Tasia Catchings 12/18/2016, 8:40 AM

## 2016-12-19 ENCOUNTER — Encounter (HOSPITAL_COMMUNITY): Payer: Self-pay | Admitting: Orthopedic Surgery

## 2016-12-19 NOTE — Progress Notes (Signed)
Physical Therapy Treatment Patient Details Name: Travis Ross MRN: [id number] DOB: [birth year] Today's Date: 12/19/2016    History of Present Illness s/p R TKA revision d/t non-union periprosthetic fx;  medical history with multiple lumbar fusions with the last back surgery, bilateral foot drop after back surgery, balance issues, peripheral neuropathy, afib, HTN    PT Comments    Pt  Cooperative and motivated; continue to recommend SNF as pt will need longer term rehab/SNF level therapies to regain strength/independence/prevent falls; will continue to follow in acute setting; frequency updated to 3x/wk as pt is with longer length of stay and although pt dx is TKA revision --this was done d/t non-union fx/complication of periprosthetic fx repair   Follow Up Recommendations  SNF;Supervision/Assistance - 24 hour     Equipment Recommendations  None recommended by PT    Recommendations for Other Services       Precautions / Restrictions Precautions Precautions: Fall;Knee Required Braces or Orthoses: Knee Immobilizer - Right Knee Immobilizer - Right: Discontinue once straight leg raise with < 10 degree lag Restrictions Weight Bearing Restrictions: No Other Position/Activity Restrictions: WBAT    Mobility  Bed Mobility Overal bed mobility: Needs Assistance Bed Mobility: Rolling;Sidelying to Sit Rolling: Mod assist Sidelying to sit: Min assist;Mod assist;HOB elevated       General bed mobility comments: HOB elevated ~50*, heavy use of rail, step by step cues needed for sequencing and self assist  Transfers                 General transfer comment: maxisky pad  placed in sitting to allow work on sitting balance in unsupported sitting/wt shifting  Ambulation/Gait                 Stairs            Wheelchair Mobility    Modified Rankin (Stroke Patients Only)       Balance Overall balance assessment: Needs assistance Sitting-balance support: Feet  supported;Single extremity supported;No upper extremity supported Sitting balance-Leahy Scale: Fair Sitting balance - Comments: fair to good; pt able to wt shift right and left to un-weight for pad placement; requires facilitation for wt shifting/direction/controlled movement       Standing balance comment: unable to test at this time, pt non-amb prior to surgery                            Cognition Arousal/Alertness: Awake/alert Behavior During Therapy: WFL for tasks assessed/performed Overall Cognitive Status: No family/caregiver present to determine baseline cognitive functioning Area of Impairment: Following commands;Problem solving;Safety/judgement                       Following Commands: Follows one step commands inconsistently     Problem Solving: Difficulty sequencing;Requires verbal cues;Requires tactile cues General Comments: continues to be tangential in speech and thoughts,  pt has difficulty following one step commands for exercises and functional tasks, requires frequent redirection, repetition and reiteration of instructions      Exercises Total Joint Exercises Ankle Circles/Pumps: PROM;5 reps;Both Quad Sets: AROM;Strengthening;Both;10 reps Gluteal Sets: Strengthening;10 reps;Both Long Arc Quad: AAROM;AROM;Strengthening;Both;10 reps;Seated    General Comments        Pertinent Vitals/Pain Pain Assessment: Faces Faces Pain Scale: Hurts a little bit Pain Location: R knee and back  Pain Descriptors / Indicators: Sore Pain Intervention(s): Monitored during session    Home Living  Prior Function            PT Goals (current goals can now be found in the care plan section) Acute Rehab PT Goals Patient Stated Goal: be able to use rollator  PT Goal Formulation: With patient Time For Goal Achievement: 12/23/16 Potential to Achieve Goals: Good Progress towards PT goals: Progressing toward goals     Frequency    Min 3X/week      PT Plan Current plan remains appropriate;Frequency needs to be updated    Co-evaluation              AM-PAC PT "6 Clicks" Daily Activity  Outcome Measure  Difficulty turning over in bed (including adjusting bedclothes, sheets and blankets)?: Unable Difficulty moving from lying on back to sitting on the side of the bed? : Unable Difficulty sitting down on and standing up from a chair with arms (e.g., wheelchair, bedside commode, etc,.)?: Unable Help needed moving to and from a bed to chair (including a wheelchair)?: Total Help needed walking in hospital room?: Total Help needed climbing 3-5 steps with a railing? : Total 6 Click Score: 6    End of Session   Activity Tolerance: Patient tolerated treatment well Patient left: in chair;with call bell/phone within reach Nurse Communication: Mobility status;Need for lift equipment PT Visit Diagnosis: Muscle weakness (generalized) (M62.81);History of falling (Z91.81)     Time: 5176-1607 PT Time Calculation (min) (ACUTE ONLY): 33 min  Charges:  $Therapeutic Exercise: 8-22 mins $Therapeutic Activity: 8-22 mins                    G CodesKenyon Ana, PT Pager: [phone] 12/19/2016    [facility] 12/19/2016, 10:52 AM

## 2016-12-19 NOTE — Care Management Important Message (Signed)
Important Message  Patient Details  Name: Terique Kawabata MRN: [id number] Date of Birth: 05/23/1926   Medicare Important Message Given:  Yes    Kerin Salen 12/19/2016, 11:38 AMImportant Message  Patient Details  Name: Ishmael Berkovich MRN: [id number] Date of Birth: 11-25-1926   Medicare Important Message Given:  Yes    Kerin Salen 12/19/2016, 11:38 AM

## 2016-12-19 NOTE — Progress Notes (Signed)
CSW talk with patient daughter [name] and discussed SNF options. Patient daughter is prefers Counselling psychologist. Patient daughter aware of the insurance authorization process. CSW informed facility liaison to start authorization process.   Kathrin Greathouse, Latanya Presser, MSW Clinical Social Worker  [phone] 12/19/2016  8:53 AM

## 2016-12-19 NOTE — Progress Notes (Signed)
Subjective: 4 Days Post-Op Procedure(s) (LRB): RIGHT TOTAL KNEE REVISION (Right) Patient reports pain as mild.   Patient seen in rounds by Dr. Wynelle Link. Patient is well, but has had some minor complaints of pain in the knee, requiring pain medications Plan is to go Skilled nursing facility after hospital stay.  Objective: Vital signs in last 24 hours: Temp:  [97.7 F (36.5 C)-98.3 F (36.8 C)] 97.7 F (36.5 C) (10/22 0547) Pulse Rate:  [74-91] 82 (10/22 0547) Resp:  [17-18] 18 (10/22 0547) BP: (115-121)/(80-92) 121/85 (10/22 0547) SpO2:  [95 %-99 %] 99 % (10/22 0547)  Intake/Output from previous day:  Intake/Output Summary (Last 24 hours) at 12/19/16 0753 Last data filed at 12/19/16 0932  Gross per 24 hour  Intake             1980 ml  Output             2575 ml  Net             -595 ml    Intake/Output this shift: No intake/output data recorded.  Labs:  Recent Labs  12/17/16 0600 12/18/16 0522  HGB 9.1* 8.8*    Recent Labs  12/17/16 0600 12/18/16 0522  WBC 10.6* 10.0  RBC 3.06* 3.04*  HCT 28.5* 27.9*  PLT 121* 117*    Recent Labs  12/17/16 0600  NA 139  K 4.3  CL 103  CO2 26  BUN 29*  CREATININE 1.02  GLUCOSE 122*  CALCIUM 8.4*   No results for input(s): LABPT, INR in the last 72 hours.  EXAM General - Patient is Alert and Appropriate Extremity - Neurovascular intact Sensation intact distally Dressing/Incision - clean, dry Motor Function - intact, moving foot and toes well on exam.   Past Medical History:  Diagnosis Date  . COPD with chronic bronchitis and emphysema ([place])   . Essential hypertension   . Femur fracture, right ([place])    Related to a fall. Periprosthetic distal femur fracture (close to the right knee prosthesis).  > Initial plans were to treat with brace, however, has now progressed and may require surgery  . GERD (gastroesophageal reflux disease)   . Glaucoma   . Hyperlipidemia   . Hypothyroidism (acquired)    On  Levothyroxine  . Myasthenia gravis ([place])    Now (prior to recent femur fracture) was relegated to maneuvering himself around on a Rolling Walker (Rollator) using his feet & sitting on the walker.  Does not have arm strength to mover a wheelchair or transfer.;   . Osteoarthritis    Global osteoarthritis involving back, hips and knees as well as elbows.  . Paresthesia    bilateral upper extremities, very weak ; poor grip  (must use adaptable silverware );   . Permanent atrial fibrillation (HCC)    Long-standing (> 4 yrs). Initially evaluated with Stress Test & Echo - Pt reports that these studies were "normal"    Assessment/Plan: 4 Days Post-Op Procedure(s) (LRB): RIGHT TOTAL KNEE REVISION (Right) Principal Problem:   Periprosthetic fracture around internal prosthetic right knee joint Active Problems:   Failed total knee arthroplasty ([place])  Estimated body mass index is 25.07 kg/m as calculated from the following:   Height as of this encounter: 6\' 1"  (1.854 m).   Weight as of this encounter: 86.2 kg (190 lb). Discharge to SNF once bed available  DVT Prophylaxis - Xarelto Weight-Bearing as tolerated to right leg  Arlee Muslim, PA-C Orthopaedic Surgery 12/19/2016, 7:53 AM

## 2016-12-20 ENCOUNTER — Encounter (HOSPITAL_COMMUNITY): Payer: Self-pay | Admitting: Orthopedic Surgery

## 2016-12-20 MED ORDER — OXYCODONE HCL ER 30 MG PO T12A: 30.0000 mg | EXTENDED_RELEASE_TABLET | Freq: Two times a day (BID) | ORAL | 0 refills | Status: DC

## 2016-12-20 MED ORDER — ASPIRIN EC 325 MG PO TBEC: 325.0000 mg | DELAYED_RELEASE_TABLET | Freq: Every day | ORAL | 0 refills | Status: AC

## 2016-12-20 MED ORDER — METHOCARBAMOL 500 MG PO TABS: 500.0000 mg | ORAL_TABLET | Freq: Four times a day (QID) | ORAL | 0 refills | Status: DC | PRN

## 2016-12-20 MED ORDER — CALCIUM POLYCARBOPHIL 625 MG PO TABS: 625.0000 mg | ORAL_TABLET | Freq: Every day | ORAL | 0 refills | Status: DC

## 2016-12-20 MED ORDER — OXYCODONE HCL 5 MG PO TABS: 5.0000 mg | ORAL_TABLET | ORAL | 0 refills | Status: DC | PRN

## 2016-12-20 MED ORDER — TRAMADOL HCL 50 MG PO TABS: 50.0000 mg | ORAL_TABLET | Freq: Four times a day (QID) | ORAL | 0 refills | Status: DC | PRN

## 2016-12-20 NOTE — Discharge Summary (Signed)
Physician Discharge Summary   Patient ID: Travis Ross MRN: [id number] DOB/AGE: [birth year] 83 y.o.  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  Admission Diagnoses:  Past Medical History:  Diagnosis Date  . COPD with chronic bronchitis and emphysema ([place])   . Essential hypertension   . Femur fracture, right ([place])    Related to a fall. Periprosthetic distal femur fracture (close to the right knee prosthesis).  > Initial plans were to treat with brace, however, has now progressed and may require surgery  . GERD (gastroesophageal reflux disease)   . Glaucoma   . Hyperlipidemia   . Hypothyroidism (acquired)    On Levothyroxine  . Myasthenia gravis ([place])    Now (prior to recent femur fracture) was relegated to maneuvering himself around on a Rolling Walker (Rollator) using his feet & sitting on the walker.  Does not have arm strength to mover a wheelchair or transfer.;   . Osteoarthritis    Global osteoarthritis involving back, hips and knees as well as elbows.  . Paresthesia    bilateral upper extremities, very weak ; poor grip  (must use adaptable silverware );   . Permanent atrial fibrillation (HCC)    Long-standing (> 4 yrs). Initially evaluated with Stress Test & Echo - Pt reports that these studies were "normal"   Discharge Diagnoses:   Principal Problem:   Periprosthetic fracture around internal prosthetic right knee joint Active Problems:   Failed total knee arthroplasty ([place])  Estimated body mass index is 25.07 kg/m as calculated from the following:   Height as of this encounter: '6\' 1"'$  (1.854 m).   Weight as of this encounter: 86.2 kg (190 lb).  Procedure:  Procedure(s) (LRB): RIGHT TOTAL KNEE REVISION (Right)   Consults: None  HPI: The patient is a 83 year old male who comes in  for a preoperative History and Physical. The patient is scheduled for a right total knee revision to  be performed by Dr. Dione Plover. [name], MD at [facility] on 12/15/2016. The patient is a 83 year old male who presents with knee complaints. The patient reports right knee symptoms including: pain which began 2 month(s) ago following a specific injury. The injury occurred due to a fall while the patient was at home. Prior to being seen today the patient was previously evaluated by a colleague (Dr. Loney Laurence in [place]; treated conservatively for periprosthetic distal femur fracture). Previous work-up for this problem has included knee x-rays. Past treatment for this problem has included knee brace (TROM. His daughter states that he is not supposed to have any flexion at this point. However, when he was discharged to SNF, the therapist at one point did have the [place] set at 0-20. The surgeon in [place], then corrected that throught the SNF. He moved here on August 15th, and initially had home PT. He is now at [facility], and has been getting OT and PT there). Current treatment includes opioid analgesics (He states that he takes pain medications due to multi-level injuries in his back. He has had multiple lumbar fusions in the past). Risk factors include total knee replacement (Right TKA in 2003; Left TKA in 2010). Note for "Knee pain": Travis Ross comes in today accompanied by his daughter [name] for evaluation and second opinion on a right distal periprosthetic femur fracture. Travis Ross is an 83 yo male who was living in [place] with one of his daughters at the  time of the fracture but was in the process of moving to [facility] [place] on the day of the injury. The moving trucks were loading up the day of the accident. He fell when moving from the bathroom into the next room when he lost his balance and fell between the grab bars and the rollator walker that he has been dependent upon for years. He was transferred to [facility] where he stayed for 10 days and then went into a rehab  unit. He was since removed from the rehab unit and brought to [place] to be assisted by his other daughter. They tried to take care of him for some time at home but he is now in the highest level of assisted living at [facility]. The patient has an extensive medical history with multiple lumbar fusions with the last back surgery being performed around 2000. He unfortunately developed bilateral foot drops after the surgery which has given him permanent balance issues requiring the use of some time of ambulatory device for mobility. His balance has continued to be an issue and has been on a rollator for the past three years. He has been able to sit and scoot around using both feet to be "rollator mobile" for some time.  He has also been diagnosed recently with a C3 cervical cord compression causing weakness of both upper extremities and bilateral hand weakness. Due to the weakness in his hands, he was unable to hold onto the grab bars at his home in [place] when he fell. He also has myasthenia gravis. This is a nonunion of a fracture that is grossly displaced. Bracing obviously has not worked. At this stage there is no way that we will be able to do an open reduction internal fixation as he so contracted. I also do not feel that the bone quality would be good enough to heal. We cannot leave it as is as he is completely nonfunctional now. The most predictable way of getting this better is to resect the distal femur and to do a distal femoral replacement. We did discuss this in detail including the procedure risks and potential complications and he was admitted for surgical intervention.  Laboratory Data: Admission on 12/15/2016  Component Date Value Ref Range Status  . WBC 12/16/2016 11.1* 4.0 - 10.5 K/uL Final  . RBC 12/16/2016 3.48* 4.22 - 5.81 MIL/uL Final  . Hemoglobin 12/16/2016 10.1* 13.0 - 17.0 g/dL Final  . HCT 12/16/2016 32.7* 39.0 - 52.0 % Final  . MCV 12/16/2016 94.0  78.0  - 100.0 fL Final  . MCH 12/16/2016 29.0  26.0 - 34.0 pg Final  . MCHC 12/16/2016 30.9  30.0 - 36.0 g/dL Final  . RDW 12/16/2016 17.1* 11.5 - 15.5 % Final  . Platelets 12/16/2016 129* 150 - 400 K/uL Final  . Sodium 12/16/2016 135  135 - 145 mmol/L Final  . Potassium 12/16/2016 4.6  3.5 - 5.1 mmol/L Final  . Chloride 12/16/2016 98* 101 - 111 mmol/L Final  . CO2 12/16/2016 26  22 - 32 mmol/L Final  . Glucose, Bld 12/16/2016 140* 65 - 99 mg/dL Final  . BUN 12/16/2016 29* 6 - 20 mg/dL Final  . Creatinine, Ser 12/16/2016 0.89  0.61 - 1.24 mg/dL Final  . Calcium 12/16/2016 8.5* 8.9 - 10.3 mg/dL Final  . GFR calc non Af Amer 12/16/2016 >60  >60 mL/min Final  . GFR calc Af Amer 12/16/2016 >60  >60 mL/min Final   Comment: (  NOTE) The eGFR has been calculated using the CKD EPI equation. This calculation has not been validated in all clinical situations. eGFR's persistently <60 mL/min signify possible Chronic Kidney Disease.   . Anion gap 12/16/2016 11  5 - 15 Final  . Hemoglobin 12/15/2016 10.2* 13.0 - 17.0 g/dL Final  . HCT 12/15/2016 32.2* 39.0 - 52.0 % Final  . WBC 12/17/2016 10.6* 4.0 - 10.5 K/uL Final  . RBC 12/17/2016 3.06* 4.22 - 5.81 MIL/uL Final  . Hemoglobin 12/17/2016 9.1* 13.0 - 17.0 g/dL Final  . HCT 12/17/2016 28.5* 39.0 - 52.0 % Final  . MCV 12/17/2016 93.1  78.0 - 100.0 fL Final  . MCH 12/17/2016 29.7  26.0 - 34.0 pg Final  . MCHC 12/17/2016 31.9  30.0 - 36.0 g/dL Final  . RDW 12/17/2016 17.3* 11.5 - 15.5 % Final  . Platelets 12/17/2016 121* 150 - 400 K/uL Final  . Sodium 12/17/2016 139  135 - 145 mmol/L Final  . Potassium 12/17/2016 4.3  3.5 - 5.1 mmol/L Final  . Chloride 12/17/2016 103  101 - 111 mmol/L Final  . CO2 12/17/2016 26  22 - 32 mmol/L Final  . Glucose, Bld 12/17/2016 122* 65 - 99 mg/dL Final  . BUN 12/17/2016 29* 6 - 20 mg/dL Final  . Creatinine, Ser 12/17/2016 1.02  0.61 - 1.24 mg/dL Final  . Calcium 12/17/2016 8.4* 8.9 - 10.3 mg/dL Final  . GFR calc non Af  Amer 12/17/2016 >60  >60 mL/min Final  . GFR calc Af Amer 12/17/2016 >60  >60 mL/min Final   Comment: (NOTE) The eGFR has been calculated using the CKD EPI equation. This calculation has not been validated in all clinical situations. eGFR's persistently <60 mL/min signify possible Chronic Kidney Disease.   . Anion gap 12/17/2016 10  5 - 15 Final  . WBC 12/18/2016 10.0  4.0 - 10.5 K/uL Final  . RBC 12/18/2016 3.04* 4.22 - 5.81 MIL/uL Final  . Hemoglobin 12/18/2016 8.8* 13.0 - 17.0 g/dL Final  . HCT 12/18/2016 27.9* 39.0 - 52.0 % Final  . MCV 12/18/2016 91.8  78.0 - 100.0 fL Final  . MCH 12/18/2016 28.9  26.0 - 34.0 pg Final  . MCHC 12/18/2016 31.5  30.0 - 36.0 g/dL Final  . RDW 12/18/2016 17.3* 11.5 - 15.5 % Final  . Platelets 12/18/2016 117* 150 - 400 K/uL Final   PLATELET COUNT CONFIRMED BY SMEAR  . WBC 12/22/2016 9.5  4.0 - 10.5 K/uL Final  . RBC 12/22/2016 2.90* 4.22 - 5.81 MIL/uL Final  . Hemoglobin 12/22/2016 8.6* 13.0 - 17.0 g/dL Final  . HCT 12/22/2016 27.3* 39.0 - 52.0 % Final  . MCV 12/22/2016 94.1  78.0 - 100.0 fL Final  . MCH 12/22/2016 29.7  26.0 - 34.0 pg Final  . MCHC 12/22/2016 31.5  30.0 - 36.0 g/dL Final  . RDW 12/22/2016 17.5* 11.5 - 15.5 % Final  . Platelets 12/22/2016 174  150 - 400 K/uL Final  . Sodium 12/22/2016 141  135 - 145 mmol/L Final  . Potassium 12/22/2016 3.5  3.5 - 5.1 mmol/L Final  . Chloride 12/22/2016 104  101 - 111 mmol/L Final  . CO2 12/22/2016 28  22 - 32 mmol/L Final  . Glucose, Bld 12/22/2016 118* 65 - 99 mg/dL Final  . BUN 12/22/2016 32* 6 - 20 mg/dL Final  . Creatinine, Ser 12/22/2016 0.80  0.61 - 1.24 mg/dL Final  . Calcium 12/22/2016 8.6* 8.9 - 10.3 mg/dL Final  . GFR calc  non Af Amer 12/22/2016 >60  >60 mL/min Final  . GFR calc Af Amer 12/22/2016 >60  >60 mL/min Final   Comment: (NOTE) The eGFR has been calculated using the CKD EPI equation. This calculation has not been validated in all clinical situations. eGFR's persistently <60  mL/min signify possible Chronic Kidney Disease.   Georgiann Hahn gap 12/22/2016 9  5 - 15 Final  Hospital Outpatient Visit on 12/08/2016  Component Date Value Ref Range Status  . aPTT 12/08/2016 34  24 - 36 seconds Final  . WBC 12/08/2016 13.3* 4.0 - 10.5 K/uL Final  . RBC 12/08/2016 4.09* 4.22 - 5.81 MIL/uL Final  . Hemoglobin 12/08/2016 11.8* 13.0 - 17.0 g/dL Final  . HCT 12/08/2016 38.2* 39.0 - 52.0 % Final  . MCV 12/08/2016 93.4  78.0 - 100.0 fL Final  . MCH 12/08/2016 28.9  26.0 - 34.0 pg Final  . MCHC 12/08/2016 30.9  30.0 - 36.0 g/dL Final  . RDW 12/08/2016 17.3* 11.5 - 15.5 % Final  . Platelets 12/08/2016 150  150 - 400 K/uL Final  . Sodium 12/08/2016 138  135 - 145 mmol/L Final  . Potassium 12/08/2016 4.0  3.5 - 5.1 mmol/L Final  . Chloride 12/08/2016 101  101 - 111 mmol/L Final  . CO2 12/08/2016 27  22 - 32 mmol/L Final  . Glucose, Bld 12/08/2016 112* 65 - 99 mg/dL Final  . BUN 12/08/2016 41* 6 - 20 mg/dL Final  . Creatinine, Ser 12/08/2016 1.10  0.61 - 1.24 mg/dL Final  . Calcium 12/08/2016 8.9  8.9 - 10.3 mg/dL Final  . Total Protein 12/08/2016 6.4* 6.5 - 8.1 g/dL Final  . Albumin 12/08/2016 3.7  3.5 - 5.0 g/dL Final  . AST 12/08/2016 21  15 - 41 U/L Final  . ALT 12/08/2016 15* 17 - 63 U/L Final  . Alkaline Phosphatase 12/08/2016 139* 38 - 126 U/L Final  . Total Bilirubin 12/08/2016 1.1  0.3 - 1.2 mg/dL Final  . GFR calc non Af Amer 12/08/2016 58* >60 mL/min Final  . GFR calc Af Amer 12/08/2016 >60  >60 mL/min Final   Comment: (NOTE) The eGFR has been calculated using the CKD EPI equation. This calculation has not been validated in all clinical situations. eGFR's persistently <60 mL/min signify possible Chronic Kidney Disease.   . Anion gap 12/08/2016 10  5 - 15 Final  . Prothrombin Time 12/08/2016 14.3  11.4 - 15.2 seconds Final  . INR 12/08/2016 1.12   Final  . ABO/RH(D) 12/08/2016 A POS   Final  . Antibody Screen 12/08/2016 NEG   Final  . Sample Expiration  12/08/2016 12/18/2016   Final  . Extend sample reason 12/08/2016 NO TRANSFUSIONS OR PREGNANCY IN THE PAST 3 MONTHS   Final  . MRSA, PCR 12/08/2016 NEGATIVE  NEGATIVE Final  . Staphylococcus aureus 12/08/2016 POSITIVE* NEGATIVE Final   Comment: (NOTE) The Xpert SA Assay (FDA approved for NASAL specimens in patients 71 years of age and older), is one component of a comprehensive surveillance program. It is not intended to diagnose infection nor to guide or monitor treatment.   . ABO/RH(D) 12/08/2016 A POS   Final     X-Rays:No results found.  EKG: Orders placed or performed in visit on 12/02/16  . EKG 12-Lead     Hospital Course: Travis Ross is a 83 y.o. who was admitted to [facility]. They were brought to the operating room on 12/15/2016 and underwent Procedure(s): RIGHT TOTAL KNEE REVISION.  Patient tolerated the procedure well and was later transferred to the recovery room and then to the orthopaedic floor for postoperative care.  They were given PO and IV analgesics for pain control following their surgery.  They were given 24 hours of postoperative antibiotics of  Anti-infectives    Start     Dose/Rate Route Frequency Ordered Stop   12/16/16 0600  ceFAZolin (ANCEF) IVPB 2g/100 mL premix  Status:  Discontinued     2 g 200 mL/hr over 30 Minutes Intravenous On call to O.R. 12/15/16 1202 12/15/16 1204   12/15/16 2100  ceFAZolin (ANCEF) IVPB 2g/100 mL premix     2 g 200 mL/hr over 30 Minutes Intravenous Every 6 hours 12/15/16 1842 12/16/16 0316   12/15/16 1240  ceFAZolin (ANCEF) 2-4 GM/100ML-% IVPB    Comments:  [place] Session   : cabinet override      12/15/16 1240 12/15/16 1424   12/15/16 1215  ceFAZolin (ANCEF) IVPB 2g/100 mL premix     2 g 200 mL/hr over 30 Minutes Intravenous On call to O.R. 12/15/16 1204 12/15/16 1424     and started on DVT prophylaxis in the form of Xarelto.   PT and OT were ordered for total joint protocol.  Discharge planning consulted to  help with postop disposition and equipment needs.  Social worker consulted to assist with placement of the patient.  Patient had a tough night on the evening of surgery.  They started to get up OOB with therapy on day one. The patient has an extensive medical history with multiple lumbar fusions with the last back surgery being performed around 2000. He unfortunately developed bilateral foot drops after the surgery which has given him permanent balance issues requiring the use of some time of ambulatory device for mobility. His balance has continued to be an issue and had been on a rollator for the past three years. He has been able to sit and scoot around using both feet to be "rollator mobile" for some time. Hemovac drain was pulled without difficulty.  Continued to work with therapy into day two. Feeling better on day two. Dressing was changed on day two and the incision was healing well and staples intact.  By day three, the patient was having some pain. Plan for SNF at D/C.  Incision was healing well.  Patient was seen in rounds on day four.  Patient was well, but has had some minor complaints of pain in the knee, requiring pain medications.  Plan was still to go Skilled nursing facility after hospital stay.  He was seen and was ready to go to the SNF of choice. Unfortunately, insurance approval did not come thru.  The patient remained in house on day five and day six and continued to receive therapy on a daily basis.  The incision continued to heal.  Social worker continued to work with [employer].  He was seen again on POD 7 by Dr. Wynelle Link and was stable on rounds.  Waiting on final approval from insurance company and if comes thru today, then transfer to SNF today.   Discharge to SNF Diet - As tolerated Follow up - in 1 week on November 12/29/2016 Activity - WBAT Disposition - Skilled nursing facility Condition Upon Discharge - Stable at time of summary D/C Meds - See DC Summary DVT  Prophylaxis - Xarelto  Allergies as of 12/22/2016   No Known Allergies     Medication List    STOP taking these medications  Vitamin D3 5000 units Caps     TAKE these medications   acetaminophen 325 MG tablet Commonly known as:  TYLENOL Take 650 mg by mouth every 6 (six) hours as needed for moderate pain.   aspirin EC 325 MG tablet Take 1 tablet (325 mg total) by mouth daily. Take a full dose aspirin 325 mg daily for three weeks, then reduce to a baby 81 mg Aspirin daily for three additional weeks.   atorvastatin 10 MG tablet Commonly known as:  LIPITOR Take 10 mg by mouth 2 (two) times daily at 8 am and 4 pm.   azaTHIOprine 50 MG tablet Commonly known as:  IMURAN Take 50 mg by mouth 2 (two) times daily.   clopidogrel 75 MG tablet Commonly known as:  PLAVIX Take 75 mg by mouth every other day.   docusate sodium 100 MG capsule Commonly known as:  COLACE Take 100 mg by mouth 2 (two) times daily.   DULERA 100-5 MCG/ACT Aero Generic drug:  mometasone-formoterol Take 2 puffs by mouth 2 (two) times daily.   Fiber 0.52 g Caps Take 0.52 g by mouth at bedtime.   furosemide 40 MG tablet Commonly known as:  LASIX Take 40 mg by mouth daily.   levothyroxine 175 MCG tablet Commonly known as:  SYNTHROID, LEVOTHROID Take 175 mcg by mouth daily.   losartan 50 MG tablet Commonly known as:  COZAAR Take 50 mg by mouth daily.   magnesium hydroxide 400 MG/5ML suspension Commonly known as:  MILK OF MAGNESIA Take 30 mLs by mouth See admin instructions. Take 30 ml by mouth at bedtime every Monday, Wednesday and Friday. Take 30 ml by mouth every 24 hours as needed for constipation.   methocarbamol 500 MG tablet Commonly known as:  ROBAXIN Take 1 tablet (500 mg total) by mouth every 6 (six) hours as needed for muscle spasms.   metoprolol succinate 50 MG 24 hr tablet Commonly known as:  TOPROL-XL Take 50 mg by mouth daily.   oxyCODONE 5 MG immediate release tablet Commonly  known as:  Oxy IR/ROXICODONE Take 1-2 tablets (5-10 mg total) by mouth every 4 (four) hours as needed for moderate pain or severe pain. What changed:  medication strength  how much to take  reasons to take this   oxyCODONE 30 MG 12 hr tablet Commonly known as:  OXYCONTIN Take 1 tablet (30 mg total) by mouth every 12 (twelve) hours. What changed:  Another medication with the same name was changed. Make sure you understand how and when to take each.   pantoprazole 40 MG tablet Commonly known as:  PROTONIX Take 40 mg by mouth 2 (two) times daily.   polycarbophil 625 MG tablet Commonly known as:  FIBERCON Take 1 tablet (625 mg total) by mouth at bedtime.   polyethylene glycol packet Commonly known as:  MIRALAX / GLYCOLAX Take 17 g by mouth daily.   predniSONE 20 MG tablet Commonly known as:  DELTASONE Take 10 mg by mouth daily with breakfast.   senna 8.6 MG tablet Commonly known as:  SENOKOT Take 1 tablet by mouth every evening.   sertraline 100 MG tablet Commonly known as:  ZOLOFT Take 150 mg by mouth daily.   SIMBRINZA 1-0.2 % Susp Generic drug:  Brinzolamide-Brimonidine Place 1 drop into both eyes 2 (two) times daily.   temazepam 30 MG capsule Commonly known as:  RESTORIL Take 30 mg by mouth at bedtime.   traMADol 50 MG tablet Commonly known as:  ULTRAM Take  1-2 tablets (50-100 mg total) by mouth every 6 (six) hours as needed (mild pain).   TRAVATAN Z 0.004 % Soln ophthalmic solution Generic drug:  Travoprost (BAK Free) Place 1 drop into both eyes at bedtime.       Contact information for follow-up providers    Gaynelle Arabian, MD. Schedule an appointment as soon as possible for a visit on 12/29/2016.   Specialty:  Orthopedic Surgery Why:  Call office at [phone] ASAP to setup follow up appt for patient with Dr. Wynelle Link on Thursday 12/29/2016. Contact information: [address] [place] [address] [phone]             Contact information for after-discharge care    Destination    [facility] Follow up.   Specialty:  Skilled Nursing Facility Contact information: [place] [phone]                  Signed: Arlee Muslim, PA-C Orthopaedic Surgery 12/22/2016, 9:46 AM

## 2016-12-20 NOTE — Progress Notes (Signed)
Subjective: 5 Days Post-Op Procedure(s) (LRB): RIGHT TOTAL KNEE REVISION (Right) Patient reports pain as mild.   Patient seen in rounds for Dr. Wynelle Link. Patient is well, but has had some minor complaints of pain in the knee, requiring pain medications Patient is ready to go to the SNF if bed available and insurance approved  Objective: Vital signs in last 24 hours: Temp:  [97.6 F (36.4 C)-98.1 F (36.7 C)] 97.9 F (36.6 C) (10/23 0531) Pulse Rate:  [51-82] 51 (10/23 0531) Resp:  [16] 16 (10/23 0531) BP: (114-129)/(58-73) 129/73 (10/23 0531) SpO2:  [94 %-99 %] 99 % (10/23 0531)  Intake/Output from previous day:  Intake/Output Summary (Last 24 hours) at 12/20/16 0858 Last data filed at 12/20/16 0600  Gross per 24 hour  Intake             2220 ml  Output             2150 ml  Net               70 ml    Intake/Output this shift: No intake/output data recorded.  Labs:  Recent Labs  12/18/16 0522  HGB 8.8*    Recent Labs  12/18/16 0522  WBC 10.0  RBC 3.04*  HCT 27.9*  PLT 117*   No results for input(s): NA, K, CL, CO2, BUN, CREATININE, GLUCOSE, CALCIUM in the last 72 hours. No results for input(s): LABPT, INR in the last 72 hours.  EXAM: General - Patient is Alert and Appropriate Extremity - Neurovascular intact Sensation intact distally Intact pulses distally Dorsiflexion/Plantar flexion intact Incision - clean, staple intact, only scant serous on incision. Motor Function - intact, moving foot and toes well on exam.   Assessment/Plan: 5 Days Post-Op Procedure(s) (LRB): RIGHT TOTAL KNEE REVISION (Right) Procedure(s) (LRB): RIGHT TOTAL KNEE REVISION (Right) Past Medical History:  Diagnosis Date  . COPD with chronic bronchitis and emphysema ([place])   . Essential hypertension   . Femur fracture, right ([place])    Related to a fall. Periprosthetic distal femur fracture (close to the right knee prosthesis).  > Initial plans were to treat with brace, however,  has now progressed and may require surgery  . GERD (gastroesophageal reflux disease)   . Glaucoma   . Hyperlipidemia   . Hypothyroidism (acquired)    On Levothyroxine  . Myasthenia gravis ([place])    Now (prior to recent femur fracture) was relegated to maneuvering himself around on a Rolling Walker (Rollator) using his feet & sitting on the walker.  Does not have arm strength to mover a wheelchair or transfer.;   . Osteoarthritis    Global osteoarthritis involving back, hips and knees as well as elbows.  . Paresthesia    bilateral upper extremities, very weak ; poor grip  (must use adaptable silverware );   . Permanent atrial fibrillation (HCC)    Long-standing (> 4 yrs). Initially evaluated with Stress Test & Echo - Pt reports that these studies were "normal"   Principal Problem:   Periprosthetic fracture around internal prosthetic right knee joint Active Problems:   Failed total knee arthroplasty ([place])  Estimated body mass index is 25.07 kg/m as calculated from the following:   Height as of this encounter: 6\' 1"  (1.854 m).   Weight as of this encounter: 86.2 kg (190 lb). Discharge to SNF Diet - As tolerated Follow up - in 2 weeks Activity - WBAT Disposition - Skilled nursing facility Condition Upon Discharge - Stable D/C Meds -  See DC Summary DVT Prophylaxis - Xarelto  Arlee Muslim, PA-C Orthopaedic Surgery 12/20/2016, 8:58 AM

## 2016-12-20 NOTE — Progress Notes (Signed)
CSW following for patient d/c needs. Insurance authorization is still pending. Updated clinical information faxed.   Kathrin Greathouse, Latanya Presser, MSW Clinical Social Worker  [phone] 12/20/2016  9:09 AM

## 2016-12-20 NOTE — Progress Notes (Signed)
Occupational Therapy Treatment Patient Details Name: Travis Ross MRN: [id number] DOB: 06-Jul-1926 Today's Date: 12/20/2016    History of present illness s/p R TKA revision d/t non-union periprosthetic fx;  medical history with multiple lumbar fusions with the last back surgery, bilateral foot drop after back surgery, balance issues, peripheral neuropathy, afib, HTN   OT comments  Pt able to roll with mod A; rolled several times to place bedpan and adjust pad under him.  Follow Up Recommendations  SNF    Equipment Recommendations       Recommendations for Other Services      Precautions / Restrictions Precautions Precautions: Fall;Knee Required Braces or Orthoses: Knee Immobilizer - Right Knee Immobilizer - Right: Discontinue once straight leg raise with < 10 degree lag Restrictions Weight Bearing Restrictions: No Other Position/Activity Restrictions: WBAT       Mobility Bed Mobility     Rolling: Mod assist         General bed mobility comments: slightly easier to R side  Transfers                      Balance                                           ADL either performed or assessed with clinical judgement   ADL                                         General ADL Comments: Pt was uncomfortable when OT arrived, rolled several times to both adjust pad under him as well as place him on bedpan. Pt states this is the only bed pan he has been able to tolerate.   Pt is unable to hold cup of water; issued lightweight lidded mug, which pt is able to reach for when table in across bed.  Reinforced knee precautions and repositioned calf on pillow     Vision       Perception     Praxis      Cognition Arousal/Alertness: Awake/alert Behavior During Therapy: WFL for tasks assessed/performed Overall Cognitive Status: No family/caregiver present to determine baseline cognitive functioning                                 General Comments: followed most commands; difficulty placing LLE bent up on bed        Exercises     Shoulder Instructions       General Comments      Pertinent Vitals/ Pain       Pain Assessment: Faces Faces Pain Scale: Hurts even more Pain Location: back/buttocks Pain Descriptors / Indicators: Pressure  Home Living                                          Prior Functioning/Environment              Frequency           Progress Toward Goals  OT Goals(current goals can now be found in the care plan section)  Progress towards OT goals: Progressing toward goals  Plan      Co-evaluation                 AM-PAC PT "6 Clicks" Daily Activity     Outcome Measure   Help from another person eating meals?: A Little Help from another person taking care of personal grooming?: A Lot Help from another person toileting, which includes using toliet, bedpan, or urinal?: Total Help from another person bathing (including washing, rinsing, drying)?: A Lot Help from another person to put on and taking off regular upper body clothing?: A Lot Help from another person to put on and taking off regular lower body clothing?: A Lot 6 Click Score: 12    End of Session    OT Visit Diagnosis: Pain Pain - Right/Left: Right Pain - part of body: Knee   Activity Tolerance Patient tolerated treatment well   Patient Left in bed;with call bell/phone within reach   Nurse Communication          Time: 5009-3818 OT Time Calculation (min): 12 min  Charges: OT General Charges $OT Visit: 1 Visit OT Treatments $Self Care/Home Management : 8-22 mins  Lesle Chris, OTR/L [phone] 12/20/2016   Ivette Castronova 12/20/2016, 11:37 AM

## 2016-12-20 NOTE — Progress Notes (Signed)
Patient and daughter Chestine Spore Authorization is still pending.CSW will notify medical team once received.   Kathrin Greathouse, Latanya Presser, MSW Clinical Social Worker  [phone] 12/20/2016  4:12 PM

## 2016-12-21 MED ORDER — AZATHIOPRINE 50 MG PO TABS
50.0000 mg | ORAL_TABLET | Freq: Two times a day (BID) | ORAL | Status: DC
  Administered 2016-12-21 – 2016-12-22 (×3): 50 mg via ORAL
  Filled 2016-12-21 (×3): qty 1

## 2016-12-21 NOTE — Progress Notes (Signed)
Physical Therapy Treatment Patient Details Name: Travis Ross MRN: [id number] DOB: Apr 05, 1926 Today's Date: 12/21/2016    History of Present Illness s/p R TKA revision d/t non-union periprosthetic fx;  medical history with multiple lumbar fusions with the last back surgery, bilateral foot drop after back surgery, balance issues, peripheral neuropathy, afib, HTN    PT Comments    POD # 6 performed B LE TE's while supine in bed and side to side rolling to reposition.    Follow Up Recommendations  SNF;Supervision/Assistance - 24 hour     Equipment Recommendations  None recommended by PT    Recommendations for Other Services       Precautions / Restrictions Precautions Precautions: Fall;Knee Required Braces or Orthoses: Knee Immobilizer - Right Knee Immobilizer - Right: Discontinue once straight leg raise with < 10 degree lag Restrictions Weight Bearing Restrictions: No Other Position/Activity Restrictions: WBAT    Mobility  Bed Mobility Overal bed mobility: Needs Assistance Bed Mobility: Rolling Rolling: Mod assist         General bed mobility comments: reposition  Transfers                    Ambulation/Gait                 Stairs            Wheelchair Mobility    Modified Rankin (Stroke Patients Only)       Balance                                            Cognition Arousal/Alertness: Awake/alert Behavior During Therapy: WFL for tasks assessed/performed Overall Cognitive Status: Within Functional Limits for tasks assessed                                        Exercises B LE  TE's  10 reps towel squeezes 10 reps knee presses 10 reps heel slides AAROM 10 reps SAQ's AAROM 10 reps SLR's AAROM 10 reps ABD AAROM      General Comments        Pertinent Vitals/Pain Pain Assessment: Faces Faces Pain Scale: Hurts a little bit Pain Location: back/buttocks Pain Descriptors /  Indicators: Pressure Pain Intervention(s): Monitored during session;Repositioned    Home Living                      Prior Function            PT Goals (current goals can now be found in the care plan section) Progress towards PT goals: Progressing toward goals    Frequency    Min 3X/week      PT Plan Current plan remains appropriate;Frequency needs to be updated    Co-evaluation              AM-PAC PT "6 Clicks" Daily Activity  Outcome Measure  Difficulty turning over in bed (including adjusting bedclothes, sheets and blankets)?: Unable Difficulty moving from lying on back to sitting on the side of the bed? : Unable Difficulty sitting down on and standing up from a chair with arms (e.g., wheelchair, bedside commode, etc,.)?: Unable Help needed moving to and from a bed to chair (including a wheelchair)?: Total Help needed walking in hospital room?: Total Help  needed climbing 3-5 steps with a railing? : Total 6 Click Score: 6    End of Session   Activity Tolerance: Patient tolerated treatment well Patient left: in bed;with call bell/phone within reach   PT Visit Diagnosis: Muscle weakness (generalized) (M62.81);History of falling (Z91.81)     Time: 8350-7573 PT Time Calculation (min) (ACUTE ONLY): 12 min  Charges:  $Therapeutic Exercise: 8-22 mins                    G Codes:       Rica Koyanagi  PTA [facility]  Acute  Rehab Pager      [phone]

## 2016-12-21 NOTE — Progress Notes (Signed)
CSW met with patient at bedside and spoke to daughter via phone.  Patient has not yet received authorization at [facility].  CSW will continue to assist with discharge and discuss alternate discharge plans.   Kathrin Greathouse, Latanya Presser, MSW Clinical Social Worker  [phone] 12/21/2016  5:57 PM

## 2016-12-21 NOTE — Progress Notes (Signed)
Debbie, a Associate Professor called me to make me aware of her conversation with the patient and how the conversation showed some depression with possible questionable thoughts of suicide. I went into room spent some time with patient and asked if he had any thoughts of suicide or harming self and he said no, he would not do that even though he was a burden on his dying daughter he would not do that. I reinforced this belief as he had no plan and he again said he would not commit suicide. Dr Wynelle Link was made aware of this conversation and is aware of all that the patient is going through. Patient was offered to have a chaplin or a comfort sitter come visit and he declined both saying he didn't need them.

## 2016-12-22 DIAGNOSIS — S7291XD Unspecified fracture of right femur, subsequent encounter for closed fracture with routine healing: Secondary | ICD-10-CM | POA: Diagnosis not present

## 2016-12-22 DIAGNOSIS — G8911 Acute pain due to trauma: Secondary | ICD-10-CM | POA: Diagnosis not present

## 2016-12-22 DIAGNOSIS — M159 Polyosteoarthritis, unspecified: Secondary | ICD-10-CM | POA: Diagnosis not present

## 2016-12-22 DIAGNOSIS — S8990XA Unspecified injury of unspecified lower leg, initial encounter: Secondary | ICD-10-CM | POA: Diagnosis not present

## 2016-12-22 DIAGNOSIS — K5901 Slow transit constipation: Secondary | ICD-10-CM | POA: Diagnosis not present

## 2016-12-22 DIAGNOSIS — I10 Essential (primary) hypertension: Secondary | ICD-10-CM | POA: Diagnosis not present

## 2016-12-22 DIAGNOSIS — S7290XA Unspecified fracture of unspecified femur, initial encounter for closed fracture: Secondary | ICD-10-CM | POA: Diagnosis not present

## 2016-12-22 DIAGNOSIS — L89312 Pressure ulcer of right buttock, stage 2: Secondary | ICD-10-CM | POA: Diagnosis not present

## 2016-12-22 DIAGNOSIS — Z7901 Long term (current) use of anticoagulants: Secondary | ICD-10-CM | POA: Diagnosis not present

## 2016-12-22 DIAGNOSIS — M79661 Pain in right lower leg: Secondary | ICD-10-CM | POA: Diagnosis not present

## 2016-12-22 DIAGNOSIS — G7000 Myasthenia gravis without (acute) exacerbation: Secondary | ICD-10-CM | POA: Diagnosis not present

## 2016-12-22 DIAGNOSIS — M9711XD Periprosthetic fracture around internal prosthetic right knee joint, subsequent encounter: Secondary | ICD-10-CM | POA: Diagnosis not present

## 2016-12-22 DIAGNOSIS — E785 Hyperlipidemia, unspecified: Secondary | ICD-10-CM | POA: Diagnosis not present

## 2016-12-22 DIAGNOSIS — M5030 Other cervical disc degeneration, unspecified cervical region: Secondary | ICD-10-CM | POA: Diagnosis not present

## 2016-12-22 DIAGNOSIS — M1711 Unilateral primary osteoarthritis, right knee: Secondary | ICD-10-CM | POA: Diagnosis not present

## 2016-12-22 DIAGNOSIS — Z96651 Presence of right artificial knee joint: Secondary | ICD-10-CM | POA: Diagnosis not present

## 2016-12-22 DIAGNOSIS — L89322 Pressure ulcer of left buttock, stage 2: Secondary | ICD-10-CM | POA: Diagnosis not present

## 2016-12-22 DIAGNOSIS — Z79899 Other long term (current) drug therapy: Secondary | ICD-10-CM | POA: Diagnosis not present

## 2016-12-22 DIAGNOSIS — G8929 Other chronic pain: Secondary | ICD-10-CM | POA: Diagnosis not present

## 2016-12-22 DIAGNOSIS — H4089 Other specified glaucoma: Secondary | ICD-10-CM | POA: Diagnosis not present

## 2016-12-22 DIAGNOSIS — R2681 Unsteadiness on feet: Secondary | ICD-10-CM | POA: Diagnosis not present

## 2016-12-22 DIAGNOSIS — R69 Illness, unspecified: Secondary | ICD-10-CM | POA: Diagnosis not present

## 2016-12-22 DIAGNOSIS — J441 Chronic obstructive pulmonary disease with (acute) exacerbation: Secondary | ICD-10-CM | POA: Diagnosis not present

## 2016-12-22 DIAGNOSIS — J449 Chronic obstructive pulmonary disease, unspecified: Secondary | ICD-10-CM | POA: Diagnosis not present

## 2016-12-22 DIAGNOSIS — I4891 Unspecified atrial fibrillation: Secondary | ICD-10-CM | POA: Diagnosis not present

## 2016-12-22 DIAGNOSIS — E038 Other specified hypothyroidism: Secondary | ICD-10-CM | POA: Diagnosis not present

## 2016-12-22 DIAGNOSIS — R278 Other lack of coordination: Secondary | ICD-10-CM | POA: Diagnosis not present

## 2016-12-22 DIAGNOSIS — R41841 Cognitive communication deficit: Secondary | ICD-10-CM | POA: Diagnosis not present

## 2016-12-22 DIAGNOSIS — M4802 Spinal stenosis, cervical region: Secondary | ICD-10-CM | POA: Diagnosis not present

## 2016-12-22 DIAGNOSIS — M545 Low back pain: Secondary | ICD-10-CM | POA: Diagnosis not present

## 2016-12-22 DIAGNOSIS — M6281 Muscle weakness (generalized): Secondary | ICD-10-CM | POA: Diagnosis not present

## 2016-12-22 LAB — CBC
HCT: 27.3 % — ABNORMAL LOW (ref 39.0–52.0)
Hemoglobin: 8.6 g/dL — ABNORMAL LOW (ref 13.0–17.0)
MCH: 29.7 pg (ref 26.0–34.0)
MCHC: 31.5 g/dL (ref 30.0–36.0)
MCV: 94.1 fL (ref 78.0–100.0)
PLATELETS: 174 10*3/uL (ref 150–400)
RBC: 2.9 MIL/uL — AB (ref 4.22–5.81)
RDW: 17.5 % — AB (ref 11.5–15.5)
WBC: 9.5 10*3/uL (ref 4.0–10.5)

## 2016-12-22 LAB — BASIC METABOLIC PANEL
Anion gap: 9 (ref 5–15)
BUN: 32 mg/dL — AB (ref 6–20)
CALCIUM: 8.6 mg/dL — AB (ref 8.9–10.3)
CO2: 28 mmol/L (ref 22–32)
CREATININE: 0.8 mg/dL (ref 0.61–1.24)
Chloride: 104 mmol/L (ref 101–111)
GFR calc Af Amer: >60 mL/min (ref >60)
GLUCOSE: 118 mg/dL — AB (ref 65–99)
POTASSIUM: 3.5 mmol/L (ref 3.5–5.1)
SODIUM: 141 mmol/L (ref 135–145)

## 2016-12-22 NOTE — Care Management Important Message (Signed)
Important Message  Patient Details  Name: Travis Ross MRN: [id number] Date of Birth: [birth year]   Medicare Important Message Given:  Yes    Kerin Salen 12/22/2016, 10:40 AMImportant Message  Patient Details  Name: Travis Ross MRN: [id number] Date of Birth: 1926-03-08   Medicare Important Message Given:  Yes    Kerin Salen 12/22/2016, 10:40 AM

## 2016-12-22 NOTE — Progress Notes (Signed)
D/C Summary faxed. [facility] scheduled for transport.  Patient daughter- [name] will pick up patient belongings after five today. Nurse notified.  Med. [employer]. Scripts packet complete. Nurse given number for report.   Kathrin Greathouse, Latanya Presser, MSW Clinical Social Worker  [phone] 12/22/2016  10:30 AM

## 2016-12-22 NOTE — Progress Notes (Signed)
Subjective: 7 Days Post-Op Procedure(s) (LRB): RIGHT TOTAL KNEE REVISION (Right) Patient reports pain as mild.   Patient seen in rounds with Dr. Wynelle Link. Patient is well, and has had no acute complaints or problems Patient is ready to go to the SNF today as long as insurance approves transfer  Objective: Vital signs in last 24 hours: Temp:  [98 F (36.7 C)-98.1 F (36.7 C)] 98.1 F (36.7 C) (10/24 2207) Pulse Rate:  [72-87] 79 (10/24 2207) Resp:  [16] 16 (10/24 2207) BP: (97-101)/(50-67) 97/50 (10/24 2207) SpO2:  [94 %-97 %] 96 % (10/25 0914)  Intake/Output from previous day:  Intake/Output Summary (Last 24 hours) at 12/22/16 0938 Last data filed at 12/22/16 0923  Gross per 24 hour  Intake             1210 ml  Output             1100 ml  Net              110 ml    Intake/Output this shift: Total I/O In: 240 [P.O.:240] Out: -   Labs:  Recent Labs  12/22/16 0759  HGB 8.6*    Recent Labs  12/22/16 0759  WBC 9.5  RBC 2.90*  HCT 27.3*  PLT 174    Recent Labs  12/22/16 0759  NA 141  K 3.5  CL 104  CO2 28  BUN 32*  CREATININE 0.80  GLUCOSE 118*  CALCIUM 8.6*   No results for input(s): LABPT, INR in the last 72 hours.  EXAM: General - Patient is Alert, Appropriate and Oriented Extremity - Neurovascular intact Sensation intact distally Intact pulses distally Dorsiflexion/Plantar flexion intact Incision - clean, dry, no drainage, staples intact Motor Function - preexisting foot drop from previous back issues.  Assessment/Plan: 7 Days Post-Op Procedure(s) (LRB): RIGHT TOTAL KNEE REVISION (Right) Procedure(s) (LRB): RIGHT TOTAL KNEE REVISION (Right) Past Medical History:  Diagnosis Date  . COPD with chronic bronchitis and emphysema ([place])   . Essential hypertension   . Femur fracture, right ([place])    Related to a fall. Periprosthetic distal femur fracture (close to the right knee prosthesis).  > Initial plans were to treat with brace, however,  has now progressed and may require surgery  . GERD (gastroesophageal reflux disease)   . Glaucoma   . Hyperlipidemia   . Hypothyroidism (acquired)    On Levothyroxine  . Myasthenia gravis ([place])    Now (prior to recent femur fracture) was relegated to maneuvering himself around on a Rolling Walker (Rollator) using his feet & sitting on the walker.  Does not have arm strength to mover a wheelchair or transfer.;   . Osteoarthritis    Global osteoarthritis involving back, hips and knees as well as elbows.  . Paresthesia    bilateral upper extremities, very weak ; poor grip  (must use adaptable silverware );   . Permanent atrial fibrillation (HCC)    Long-standing (> 4 yrs). Initially evaluated with Stress Test & Echo - Pt reports that these studies were "normal"   Principal Problem:   Periprosthetic fracture around internal prosthetic right knee joint Active Problems:   Failed total knee arthroplasty ([place])  Estimated body mass index is 25.07 kg/m as calculated from the following:   Height as of this encounter: 6\' 1"  (1.854 m).   Weight as of this encounter: 86.2 kg (190 lb). Up with therapy Discharge to SNF Diet - Cardiac diet Follow up - in 1 weeks Activity -  WBAT Disposition - Skilled nursing facility Condition Upon Discharge - pending insurance approval, stable at time of rounds for transfer D/C Meds - See DC Summary DVT Prophylaxis - [place], PA-C Orthopaedic Surgery 12/22/2016, 9:38 AM

## 2016-12-22 NOTE — Progress Notes (Signed)
Authorization has been recevied. Patient can discharge this a.m. CSW assisting with discharge.  Kathrin Greathouse, Latanya Presser, MSW Clinical Social Worker  [phone] 12/22/2016  8:44 AM

## 2016-12-22 NOTE — Progress Notes (Signed)
LATE ENTRY NOTE Date of Service of Visit - Wednesday 12/21/2016    Subjective: 6 Days Post-Op Procedure(s) (LRB): RIGHT TOTAL KNEE REVISION (Right) Patient reports pain as mild.   Patient seen in rounds with Dr. Wynelle Link.  Waiting on insurance approval Patient is well, and has had no acute complaints or problems Plan is to go Skilled nursing facility after hospital stay.  Objective: Vital signs in last 24 hours: Temp:  [98 F (36.7 C)-98.1 F (36.7 C)] 98.1 F (36.7 C) (10/24 2207) Pulse Rate:  [72-87] 79 (10/24 2207) Resp:  [16] 16 (10/24 2207) BP: (97-101)/(50-67) 97/50 (10/24 2207) SpO2:  [94 %-97 %] 96 % (10/25 0914)  Intake/Output from previous day:  Intake/Output Summary (Last 24 hours) at 12/22/16 0935 Last data filed at 12/22/16 0923  Gross per 24 hour  Intake             1210 ml  Output             1100 ml  Net              110 ml    Intake/Output this shift: Total I/O In: 240 [P.O.:240] Out: -   Labs: none today   No results for input(s): LABPT, INR in the last 72 hours.  EXAM General - Patient is Alert, Appropriate and Oriented Extremity - Neurovascular intact Sensation intact distally Intact pulses distally Dorsiflexion/Plantar flexion intact Dressing/Incision - clean, dry Motor Function - intact, moving foot and toes well on exam.   Past Medical History:  Diagnosis Date  . COPD with chronic bronchitis and emphysema ([place])   . Essential hypertension   . Femur fracture, right ([place])    Related to a fall. Periprosthetic distal femur fracture (close to the right knee prosthesis).  > Initial plans were to treat with brace, however, has now progressed and may require surgery  . GERD (gastroesophageal reflux disease)   . Glaucoma   . Hyperlipidemia   . Hypothyroidism (acquired)    On Levothyroxine  . Myasthenia gravis ([place])    Now (prior to recent femur fracture) was relegated to maneuvering himself around on a Rolling Walker (Rollator) using his  feet & sitting on the walker.  Does not have arm strength to mover a wheelchair or transfer.;   . Osteoarthritis    Global osteoarthritis involving back, hips and knees as well as elbows.  . Paresthesia    bilateral upper extremities, very weak ; poor grip  (must use adaptable silverware );   . Permanent atrial fibrillation (HCC)    Long-standing (> 4 yrs). Initially evaluated with Stress Test & Echo - Pt reports that these studies were "normal"    Assessment/Plan:  Days Post-Op Procedure(s) (LRB): RIGHT TOTAL KNEE REVISION (Right) Principal Problem:   Periprosthetic fracture around internal prosthetic right knee joint Active Problems:   Failed total knee arthroplasty ([place])  Estimated body mass index is 25.07 kg/m as calculated from the following:   Height as of this encounter: 6\' 1"  (1.854 m).   Weight as of this encounter: 86.2 kg (190 lb). Up with therapy Discharge to SNF when insurance approval comes in  DVT Prophylaxis - Xarelto Weight-Bearing as tolerated to right leg  Arlee Muslim, PA-C Orthopaedic Surgery 12/22/2016, 9:35 AM

## 2016-12-27 DIAGNOSIS — E785 Hyperlipidemia, unspecified: Secondary | ICD-10-CM | POA: Diagnosis not present

## 2016-12-27 DIAGNOSIS — I4891 Unspecified atrial fibrillation: Secondary | ICD-10-CM | POA: Diagnosis not present

## 2016-12-27 DIAGNOSIS — Z96651 Presence of right artificial knee joint: Secondary | ICD-10-CM | POA: Diagnosis not present

## 2016-12-27 DIAGNOSIS — S7290XA Unspecified fracture of unspecified femur, initial encounter for closed fracture: Secondary | ICD-10-CM | POA: Diagnosis not present

## 2016-12-27 DIAGNOSIS — J441 Chronic obstructive pulmonary disease with (acute) exacerbation: Secondary | ICD-10-CM | POA: Diagnosis not present

## 2016-12-30 DIAGNOSIS — L89312 Pressure ulcer of right buttock, stage 2: Secondary | ICD-10-CM | POA: Diagnosis not present

## 2016-12-31 DIAGNOSIS — R69 Illness, unspecified: Secondary | ICD-10-CM | POA: Diagnosis not present

## 2017-01-03 DIAGNOSIS — L89312 Pressure ulcer of right buttock, stage 2: Secondary | ICD-10-CM | POA: Diagnosis not present

## 2017-01-17 DIAGNOSIS — Z79899 Other long term (current) drug therapy: Secondary | ICD-10-CM | POA: Diagnosis not present

## 2017-01-17 DIAGNOSIS — H4089 Other specified glaucoma: Secondary | ICD-10-CM | POA: Diagnosis not present

## 2017-01-17 DIAGNOSIS — I4891 Unspecified atrial fibrillation: Secondary | ICD-10-CM | POA: Diagnosis not present

## 2017-01-17 DIAGNOSIS — I10 Essential (primary) hypertension: Secondary | ICD-10-CM | POA: Diagnosis not present

## 2017-01-17 DIAGNOSIS — E038 Other specified hypothyroidism: Secondary | ICD-10-CM | POA: Diagnosis not present

## 2017-01-17 DIAGNOSIS — K5901 Slow transit constipation: Secondary | ICD-10-CM | POA: Diagnosis not present

## 2017-01-17 DIAGNOSIS — M4802 Spinal stenosis, cervical region: Secondary | ICD-10-CM | POA: Diagnosis not present

## 2017-01-17 DIAGNOSIS — Z7901 Long term (current) use of anticoagulants: Secondary | ICD-10-CM | POA: Diagnosis not present

## 2017-01-17 DIAGNOSIS — J449 Chronic obstructive pulmonary disease, unspecified: Secondary | ICD-10-CM | POA: Diagnosis not present

## 2017-01-23 DIAGNOSIS — M5030 Other cervical disc degeneration, unspecified cervical region: Secondary | ICD-10-CM | POA: Diagnosis not present

## 2017-01-23 DIAGNOSIS — S7291XD Unspecified fracture of right femur, subsequent encounter for closed fracture with routine healing: Secondary | ICD-10-CM | POA: Diagnosis not present

## 2017-01-23 DIAGNOSIS — I4891 Unspecified atrial fibrillation: Secondary | ICD-10-CM | POA: Diagnosis not present

## 2017-01-23 DIAGNOSIS — J449 Chronic obstructive pulmonary disease, unspecified: Secondary | ICD-10-CM | POA: Diagnosis not present

## 2017-01-23 DIAGNOSIS — G7000 Myasthenia gravis without (acute) exacerbation: Secondary | ICD-10-CM | POA: Diagnosis not present

## 2017-01-23 DIAGNOSIS — M159 Polyosteoarthritis, unspecified: Secondary | ICD-10-CM | POA: Diagnosis not present

## 2017-01-24 DIAGNOSIS — M1711 Unilateral primary osteoarthritis, right knee: Secondary | ICD-10-CM | POA: Diagnosis not present

## 2017-01-25 DIAGNOSIS — M5030 Other cervical disc degeneration, unspecified cervical region: Secondary | ICD-10-CM | POA: Diagnosis not present

## 2017-01-25 DIAGNOSIS — I4891 Unspecified atrial fibrillation: Secondary | ICD-10-CM | POA: Diagnosis not present

## 2017-01-25 DIAGNOSIS — J449 Chronic obstructive pulmonary disease, unspecified: Secondary | ICD-10-CM | POA: Diagnosis not present

## 2017-01-25 DIAGNOSIS — G7000 Myasthenia gravis without (acute) exacerbation: Secondary | ICD-10-CM | POA: Diagnosis not present

## 2017-01-25 DIAGNOSIS — M159 Polyosteoarthritis, unspecified: Secondary | ICD-10-CM | POA: Diagnosis not present

## 2017-01-25 DIAGNOSIS — S7291XD Unspecified fracture of right femur, subsequent encounter for closed fracture with routine healing: Secondary | ICD-10-CM | POA: Diagnosis not present

## 2017-01-27 DIAGNOSIS — G7000 Myasthenia gravis without (acute) exacerbation: Secondary | ICD-10-CM | POA: Diagnosis not present

## 2017-01-27 DIAGNOSIS — G8929 Other chronic pain: Secondary | ICD-10-CM | POA: Diagnosis not present

## 2017-01-27 DIAGNOSIS — M9711XD Periprosthetic fracture around internal prosthetic right knee joint, subsequent encounter: Secondary | ICD-10-CM | POA: Diagnosis not present

## 2017-01-27 DIAGNOSIS — I4891 Unspecified atrial fibrillation: Secondary | ICD-10-CM | POA: Diagnosis not present

## 2017-01-27 DIAGNOSIS — S7291XD Unspecified fracture of right femur, subsequent encounter for closed fracture with routine healing: Secondary | ICD-10-CM | POA: Diagnosis not present

## 2017-01-27 DIAGNOSIS — M5030 Other cervical disc degeneration, unspecified cervical region: Secondary | ICD-10-CM | POA: Diagnosis not present

## 2017-01-27 DIAGNOSIS — Z96651 Presence of right artificial knee joint: Secondary | ICD-10-CM | POA: Diagnosis not present

## 2017-01-27 DIAGNOSIS — M159 Polyosteoarthritis, unspecified: Secondary | ICD-10-CM | POA: Diagnosis not present

## 2017-01-27 DIAGNOSIS — J449 Chronic obstructive pulmonary disease, unspecified: Secondary | ICD-10-CM | POA: Diagnosis not present

## 2017-01-30 DIAGNOSIS — I4891 Unspecified atrial fibrillation: Secondary | ICD-10-CM | POA: Diagnosis not present

## 2017-01-30 DIAGNOSIS — M159 Polyosteoarthritis, unspecified: Secondary | ICD-10-CM | POA: Diagnosis not present

## 2017-01-30 DIAGNOSIS — M5030 Other cervical disc degeneration, unspecified cervical region: Secondary | ICD-10-CM | POA: Diagnosis not present

## 2017-01-30 DIAGNOSIS — S7291XD Unspecified fracture of right femur, subsequent encounter for closed fracture with routine healing: Secondary | ICD-10-CM | POA: Diagnosis not present

## 2017-01-30 DIAGNOSIS — J449 Chronic obstructive pulmonary disease, unspecified: Secondary | ICD-10-CM | POA: Diagnosis not present

## 2017-01-30 DIAGNOSIS — G7000 Myasthenia gravis without (acute) exacerbation: Secondary | ICD-10-CM | POA: Diagnosis not present

## 2017-01-31 DIAGNOSIS — S7291XD Unspecified fracture of right femur, subsequent encounter for closed fracture with routine healing: Secondary | ICD-10-CM | POA: Diagnosis not present

## 2017-01-31 DIAGNOSIS — M5030 Other cervical disc degeneration, unspecified cervical region: Secondary | ICD-10-CM | POA: Diagnosis not present

## 2017-01-31 DIAGNOSIS — J449 Chronic obstructive pulmonary disease, unspecified: Secondary | ICD-10-CM | POA: Diagnosis not present

## 2017-01-31 DIAGNOSIS — M159 Polyosteoarthritis, unspecified: Secondary | ICD-10-CM | POA: Diagnosis not present

## 2017-01-31 DIAGNOSIS — I4891 Unspecified atrial fibrillation: Secondary | ICD-10-CM | POA: Diagnosis not present

## 2017-01-31 DIAGNOSIS — G7000 Myasthenia gravis without (acute) exacerbation: Secondary | ICD-10-CM | POA: Diagnosis not present

## 2017-01-31 DIAGNOSIS — E559 Vitamin D deficiency, unspecified: Secondary | ICD-10-CM | POA: Diagnosis not present

## 2017-02-01 DIAGNOSIS — I4891 Unspecified atrial fibrillation: Secondary | ICD-10-CM | POA: Diagnosis not present

## 2017-02-01 DIAGNOSIS — M5030 Other cervical disc degeneration, unspecified cervical region: Secondary | ICD-10-CM | POA: Diagnosis not present

## 2017-02-01 DIAGNOSIS — J449 Chronic obstructive pulmonary disease, unspecified: Secondary | ICD-10-CM | POA: Diagnosis not present

## 2017-02-01 DIAGNOSIS — S7291XD Unspecified fracture of right femur, subsequent encounter for closed fracture with routine healing: Secondary | ICD-10-CM | POA: Diagnosis not present

## 2017-02-01 DIAGNOSIS — G7000 Myasthenia gravis without (acute) exacerbation: Secondary | ICD-10-CM | POA: Diagnosis not present

## 2017-02-01 DIAGNOSIS — M159 Polyosteoarthritis, unspecified: Secondary | ICD-10-CM | POA: Diagnosis not present

## 2017-02-02 DIAGNOSIS — R05 Cough: Secondary | ICD-10-CM | POA: Diagnosis not present

## 2017-02-03 DIAGNOSIS — Z48810 Encounter for surgical aftercare following surgery on the sense organs: Secondary | ICD-10-CM | POA: Diagnosis not present

## 2017-02-03 DIAGNOSIS — J209 Acute bronchitis, unspecified: Secondary | ICD-10-CM | POA: Diagnosis not present

## 2017-02-03 DIAGNOSIS — R05 Cough: Secondary | ICD-10-CM | POA: Diagnosis not present

## 2017-02-03 DIAGNOSIS — J449 Chronic obstructive pulmonary disease, unspecified: Secondary | ICD-10-CM | POA: Diagnosis not present

## 2017-02-03 DIAGNOSIS — R69 Illness, unspecified: Secondary | ICD-10-CM | POA: Diagnosis not present

## 2017-02-03 DIAGNOSIS — Z7901 Long term (current) use of anticoagulants: Secondary | ICD-10-CM | POA: Diagnosis not present

## 2017-02-03 DIAGNOSIS — E038 Other specified hypothyroidism: Secondary | ICD-10-CM | POA: Diagnosis not present

## 2017-02-03 DIAGNOSIS — G894 Chronic pain syndrome: Secondary | ICD-10-CM | POA: Diagnosis not present

## 2017-02-04 ENCOUNTER — Emergency Department (HOSPITAL_COMMUNITY): Payer: Medicare HMO

## 2017-02-04 ENCOUNTER — Inpatient Hospital Stay (HOSPITAL_COMMUNITY)
Admission: EM | Admit: 2017-02-04 | Discharge: 2017-02-08 | DRG: 193 | Disposition: A | Discharge status: Skilled Nursing Facility | Payer: Medicare HMO | Attending: Internal Medicine | Admitting: Internal Medicine

## 2017-02-04 ENCOUNTER — Encounter (HOSPITAL_COMMUNITY): Payer: Self-pay

## 2017-02-04 DIAGNOSIS — I110 Hypertensive heart disease with heart failure: Secondary | ICD-10-CM | POA: Diagnosis not present

## 2017-02-04 DIAGNOSIS — E039 Hypothyroidism, unspecified: Secondary | ICD-10-CM | POA: Diagnosis present

## 2017-02-04 DIAGNOSIS — I482 Chronic atrial fibrillation: Secondary | ICD-10-CM | POA: Diagnosis present

## 2017-02-04 DIAGNOSIS — H409 Unspecified glaucoma: Secondary | ICD-10-CM | POA: Diagnosis present

## 2017-02-04 DIAGNOSIS — Z79899 Other long term (current) drug therapy: Secondary | ICD-10-CM

## 2017-02-04 DIAGNOSIS — Z7951 Long term (current) use of inhaled steroids: Secondary | ICD-10-CM

## 2017-02-04 DIAGNOSIS — R791 Abnormal coagulation profile: Secondary | ICD-10-CM

## 2017-02-04 DIAGNOSIS — I248 Other forms of acute ischemic heart disease: Secondary | ICD-10-CM | POA: Diagnosis present

## 2017-02-04 DIAGNOSIS — I10 Essential (primary) hypertension: Secondary | ICD-10-CM | POA: Diagnosis present

## 2017-02-04 DIAGNOSIS — G7000 Myasthenia gravis without (acute) exacerbation: Secondary | ICD-10-CM | POA: Diagnosis not present

## 2017-02-04 DIAGNOSIS — R1310 Dysphagia, unspecified: Secondary | ICD-10-CM | POA: Diagnosis present

## 2017-02-04 DIAGNOSIS — J440 Chronic obstructive pulmonary disease with acute lower respiratory infection: Secondary | ICD-10-CM | POA: Diagnosis present

## 2017-02-04 DIAGNOSIS — R7989 Other specified abnormal findings of blood chemistry: Secondary | ICD-10-CM

## 2017-02-04 DIAGNOSIS — I5020 Unspecified systolic (congestive) heart failure: Secondary | ICD-10-CM | POA: Diagnosis not present

## 2017-02-04 DIAGNOSIS — R0602 Shortness of breath: Secondary | ICD-10-CM | POA: Diagnosis not present

## 2017-02-04 DIAGNOSIS — D649 Anemia, unspecified: Secondary | ICD-10-CM | POA: Diagnosis not present

## 2017-02-04 DIAGNOSIS — I5023 Acute on chronic systolic (congestive) heart failure: Secondary | ICD-10-CM | POA: Diagnosis present

## 2017-02-04 DIAGNOSIS — I4891 Unspecified atrial fibrillation: Secondary | ICD-10-CM | POA: Diagnosis not present

## 2017-02-04 DIAGNOSIS — E785 Hyperlipidemia, unspecified: Secondary | ICD-10-CM | POA: Diagnosis present

## 2017-02-04 DIAGNOSIS — M5030 Other cervical disc degeneration, unspecified cervical region: Secondary | ICD-10-CM | POA: Diagnosis not present

## 2017-02-04 DIAGNOSIS — Y95 Nosocomial condition: Secondary | ICD-10-CM | POA: Diagnosis present

## 2017-02-04 DIAGNOSIS — L89152 Pressure ulcer of sacral region, stage 2: Secondary | ICD-10-CM | POA: Diagnosis present

## 2017-02-04 DIAGNOSIS — I509 Heart failure, unspecified: Secondary | ICD-10-CM | POA: Diagnosis not present

## 2017-02-04 DIAGNOSIS — R069 Unspecified abnormalities of breathing: Secondary | ICD-10-CM | POA: Diagnosis not present

## 2017-02-04 DIAGNOSIS — Z79890 Hormone replacement therapy (postmenopausal): Secondary | ICD-10-CM

## 2017-02-04 DIAGNOSIS — I4821 Permanent atrial fibrillation: Secondary | ICD-10-CM | POA: Diagnosis present

## 2017-02-04 DIAGNOSIS — J189 Pneumonia, unspecified organism: Secondary | ICD-10-CM | POA: Diagnosis not present

## 2017-02-04 DIAGNOSIS — Z7902 Long term (current) use of antithrombotics/antiplatelets: Secondary | ICD-10-CM

## 2017-02-04 DIAGNOSIS — J449 Chronic obstructive pulmonary disease, unspecified: Secondary | ICD-10-CM | POA: Diagnosis not present

## 2017-02-04 DIAGNOSIS — I872 Venous insufficiency (chronic) (peripheral): Secondary | ICD-10-CM | POA: Diagnosis not present

## 2017-02-04 DIAGNOSIS — K219 Gastro-esophageal reflux disease without esophagitis: Secondary | ICD-10-CM | POA: Diagnosis present

## 2017-02-04 DIAGNOSIS — Z96653 Presence of artificial knee joint, bilateral: Secondary | ICD-10-CM | POA: Diagnosis present

## 2017-02-04 DIAGNOSIS — L89153 Pressure ulcer of sacral region, stage 3: Secondary | ICD-10-CM | POA: Diagnosis not present

## 2017-02-04 DIAGNOSIS — Z8249 Family history of ischemic heart disease and other diseases of the circulatory system: Secondary | ICD-10-CM

## 2017-02-04 DIAGNOSIS — M5136 Other intervertebral disc degeneration, lumbar region: Secondary | ICD-10-CM | POA: Diagnosis not present

## 2017-02-04 DIAGNOSIS — I878 Other specified disorders of veins: Secondary | ICD-10-CM | POA: Diagnosis not present

## 2017-02-04 DIAGNOSIS — Z7982 Long term (current) use of aspirin: Secondary | ICD-10-CM

## 2017-02-04 DIAGNOSIS — R778 Other specified abnormalities of plasma proteins: Secondary | ICD-10-CM

## 2017-02-04 DIAGNOSIS — F419 Anxiety disorder, unspecified: Secondary | ICD-10-CM | POA: Diagnosis present

## 2017-02-04 HISTORY — DX: Pneumonia, unspecified organism: J18.9

## 2017-02-04 LAB — CBC WITH DIFFERENTIAL/PLATELET
BASOS ABS: 0 10*3/uL (ref 0.0–0.1)
Basophils Relative: 0 %
EOS PCT: 0 %
Eosinophils Absolute: 0 10*3/uL (ref 0.0–0.7)
HEMATOCRIT: 38.2 % — AB (ref 39.0–52.0)
Hemoglobin: 11.9 g/dL — ABNORMAL LOW (ref 13.0–17.0)
LYMPHS ABS: 0.5 10*3/uL — AB (ref 0.7–4.0)
LYMPHS PCT: 3 %
MCH: 28.5 pg (ref 26.0–34.0)
MCHC: 31.2 g/dL (ref 30.0–36.0)
MCV: 91.4 fL (ref 78.0–100.0)
MONO ABS: 0.5 10*3/uL (ref 0.1–1.0)
Monocytes Relative: 3 %
NEUTROS ABS: 14.8 10*3/uL — AB (ref 1.7–7.7)
Neutrophils Relative %: 94 %
PLATELETS: 212 10*3/uL (ref 150–400)
RBC: 4.18 MIL/uL — AB (ref 4.22–5.81)
RDW: 16.3 % — ABNORMAL HIGH (ref 11.5–15.5)
WBC: 15.8 10*3/uL — ABNORMAL HIGH (ref 4.0–10.5)

## 2017-02-04 LAB — COMPREHENSIVE METABOLIC PANEL
ALBUMIN: 3.2 g/dL — AB (ref 3.5–5.0)
ALK PHOS: 158 U/L — AB (ref 38–126)
ALT: 18 U/L (ref 17–63)
AST: 38 U/L (ref 15–41)
Anion gap: 14 (ref 5–15)
BUN: 27 mg/dL — ABNORMAL HIGH (ref 6–20)
CALCIUM: 9.1 mg/dL (ref 8.9–10.3)
CHLORIDE: 104 mmol/L (ref 101–111)
CO2: 20 mmol/L — AB (ref 22–32)
CREATININE: 0.98 mg/dL (ref 0.61–1.24)
GFR calc Af Amer: >60 mL/min (ref >60)
GFR calc non Af Amer: >60 mL/min (ref >60)
GLUCOSE: 149 mg/dL — AB (ref 65–99)
Potassium: 4.2 mmol/L (ref 3.5–5.1)
SODIUM: 138 mmol/L (ref 135–145)
Total Bilirubin: 1.4 mg/dL — ABNORMAL HIGH (ref 0.3–1.2)
Total Protein: 6.6 g/dL (ref 6.5–8.1)

## 2017-02-04 LAB — URINALYSIS, ROUTINE W REFLEX MICROSCOPIC
Bilirubin Urine: NEGATIVE
GLUCOSE, UA: NEGATIVE mg/dL
HGB URINE DIPSTICK: NEGATIVE
Ketones, ur: NEGATIVE mg/dL
LEUKOCYTES UA: NEGATIVE
Nitrite: NEGATIVE
PH: 5 (ref 5.0–8.0)
Protein, ur: NEGATIVE mg/dL
SPECIFIC GRAVITY, URINE: 1.027 (ref 1.005–1.030)

## 2017-02-04 LAB — MRSA PCR SCREENING: MRSA BY PCR: NEGATIVE

## 2017-02-04 LAB — PROTIME-INR
INR: 1.34
Prothrombin Time: 16.5 seconds — ABNORMAL HIGH (ref 11.4–15.2)

## 2017-02-04 LAB — D-DIMER, QUANTITATIVE (NOT AT ARMC): D DIMER QUANT: 3.67 ug{FEU}/mL — AB (ref 0.00–0.50)

## 2017-02-04 LAB — TROPONIN I
TROPONIN I: 0.16 ng/mL — AB (ref <0.03)
TROPONIN I: 0.12 ng/mL — AB (ref <0.03)
Troponin I: 0.14 ng/mL (ref <0.03)

## 2017-02-04 LAB — BRAIN NATRIURETIC PEPTIDE: B NATRIURETIC PEPTIDE 5: 397 pg/mL — AB (ref 0.0–100.0)

## 2017-02-04 LAB — I-STAT CG4 LACTIC ACID, ED
LACTIC ACID, VENOUS: 1.43 mmol/L (ref 0.5–1.9)
Lactic Acid, Venous: 2.56 mmol/L (ref 0.5–1.9)

## 2017-02-04 MED ORDER — ACETAMINOPHEN 650 MG RE SUPP: 650.0000 mg | Freq: Four times a day (QID) | RECTAL | Status: DC | PRN

## 2017-02-04 MED ORDER — IPRATROPIUM-ALBUTEROL 0.5-2.5 (3) MG/3ML IN SOLN
3.0000 mL | Freq: Four times a day (QID) | RESPIRATORY_TRACT | Status: DC
  Administered 2017-02-04 – 2017-02-08 (×17): 3 mL via RESPIRATORY_TRACT
  Filled 2017-02-04 (×16): qty 3

## 2017-02-04 MED ORDER — DOCUSATE SODIUM 100 MG PO CAPS
100.0000 mg | ORAL_CAPSULE | Freq: Two times a day (BID) | ORAL | Status: DC
  Administered 2017-02-04 – 2017-02-07 (×6): 100 mg via ORAL
  Filled 2017-02-04 (×8): qty 1

## 2017-02-04 MED ORDER — DILTIAZEM HCL 100 MG IV SOLR
5.0000 mg/h | INTRAVENOUS | Status: DC
  Administered 2017-02-04: 5 mg/h via INTRAVENOUS
  Administered 2017-02-04 – 2017-02-05 (×2): 10 mg/h via INTRAVENOUS
  Filled 2017-02-04 (×5): qty 100

## 2017-02-04 MED ORDER — IOPAMIDOL (ISOVUE-370) INJECTION 76%
INTRAVENOUS | Status: AC
  Administered 2017-02-04: 100 mL
  Filled 2017-02-04: qty 100

## 2017-02-04 MED ORDER — SERTRALINE HCL 50 MG PO TABS
150.0000 mg | ORAL_TABLET | Freq: Every day | ORAL | Status: DC
  Administered 2017-02-05 – 2017-02-08 (×4): 150 mg via ORAL
  Filled 2017-02-04 (×4): qty 1

## 2017-02-04 MED ORDER — PANTOPRAZOLE SODIUM 40 MG PO TBEC
40.0000 mg | DELAYED_RELEASE_TABLET | Freq: Two times a day (BID) | ORAL | Status: DC
  Administered 2017-02-04 – 2017-02-08 (×9): 40 mg via ORAL
  Filled 2017-02-04 (×9): qty 1

## 2017-02-04 MED ORDER — DEXTROSE 5 % IV SOLN
500.0000 mg | INTRAVENOUS | Status: AC
  Administered 2017-02-04 – 2017-02-06 (×3): 500 mg via INTRAVENOUS
  Filled 2017-02-04 (×3): qty 500

## 2017-02-04 MED ORDER — ASPIRIN EC 81 MG PO TBEC
81.0000 mg | DELAYED_RELEASE_TABLET | Freq: Every day | ORAL | Status: DC
Start: 2017-02-04 — End: 2017-02-09
  Administered 2017-02-04 – 2017-02-08 (×6): 81 mg via ORAL
  Filled 2017-02-04 (×6): qty 1

## 2017-02-04 MED ORDER — DILTIAZEM LOAD VIA INFUSION
15.0000 mg | Freq: Once | INTRAVENOUS | Status: AC
  Administered 2017-02-04: 15 mg via INTRAVENOUS
  Filled 2017-02-04: qty 15

## 2017-02-04 MED ORDER — OXYCODONE HCL ER 15 MG PO T12A
30.0000 mg | EXTENDED_RELEASE_TABLET | Freq: Two times a day (BID) | ORAL | Status: DC
  Administered 2017-02-04 – 2017-02-08 (×9): 30 mg via ORAL
  Filled 2017-02-04 (×9): qty 2

## 2017-02-04 MED ORDER — IPRATROPIUM-ALBUTEROL 0.5-2.5 (3) MG/3ML IN SOLN
3.0000 mL | Freq: Once | RESPIRATORY_TRACT | Status: DC
  Filled 2017-02-04 (×2): qty 3

## 2017-02-04 MED ORDER — BRINZOLAMIDE 1 % OP SUSP
1.0000 [drp] | Freq: Two times a day (BID) | OPHTHALMIC | Status: DC
  Administered 2017-02-04 – 2017-02-08 (×8): 1 [drp] via OPHTHALMIC
  Filled 2017-02-04: qty 10

## 2017-02-04 MED ORDER — TEMAZEPAM 15 MG PO CAPS
30.0000 mg | ORAL_CAPSULE | Freq: Every day | ORAL | Status: DC
  Administered 2017-02-04 – 2017-02-07 (×4): 30 mg via ORAL
  Filled 2017-02-04 (×4): qty 2

## 2017-02-04 MED ORDER — FLUTICASONE PROPIONATE 50 MCG/ACT NA SUSP
1.0000 | Freq: Every day | NASAL | Status: DC
  Administered 2017-02-05 – 2017-02-07 (×2): 1 via NASAL
  Filled 2017-02-04: qty 16

## 2017-02-04 MED ORDER — LATANOPROST 0.005 % OP SOLN
1.0000 [drp] | Freq: Every day | OPHTHALMIC | Status: DC
  Administered 2017-02-04 – 2017-02-07 (×4): 1 [drp] via OPHTHALMIC
  Filled 2017-02-04: qty 2.5

## 2017-02-04 MED ORDER — OXYCODONE HCL ER 30 MG PO T12A: 30.0000 mg | EXTENDED_RELEASE_TABLET | Freq: Two times a day (BID) | ORAL | Status: DC

## 2017-02-04 MED ORDER — VANCOMYCIN HCL IN DEXTROSE 750-5 MG/150ML-% IV SOLN
750.0000 mg | Freq: Two times a day (BID) | INTRAVENOUS | Status: DC
  Administered 2017-02-05: 750 mg via INTRAVENOUS
  Filled 2017-02-04: qty 150

## 2017-02-04 MED ORDER — ATORVASTATIN CALCIUM 10 MG PO TABS
10.0000 mg | ORAL_TABLET | Freq: Every evening | ORAL | Status: DC
  Administered 2017-02-04 – 2017-02-08 (×5): 10 mg via ORAL
  Filled 2017-02-04 (×5): qty 1

## 2017-02-04 MED ORDER — HEPARIN SODIUM (PORCINE) 5000 UNIT/ML IJ SOLN
5000.0000 [IU] | Freq: Three times a day (TID) | INTRAMUSCULAR | Status: DC
  Administered 2017-02-04 – 2017-02-08 (×13): 5000 [IU] via SUBCUTANEOUS
  Filled 2017-02-04 (×13): qty 1

## 2017-02-04 MED ORDER — POLYETHYLENE GLYCOL 3350 17 G PO PACK
17.0000 g | PACK | Freq: Every day | ORAL | Status: DC
  Administered 2017-02-04 – 2017-02-07 (×4): 17 g via ORAL
  Filled 2017-02-04 (×5): qty 1

## 2017-02-04 MED ORDER — VANCOMYCIN HCL 10 G IV SOLR
1500.0000 mg | Freq: Once | INTRAVENOUS | Status: DC
  Filled 2017-02-04: qty 1500

## 2017-02-04 MED ORDER — NON FORMULARY
Freq: Two times a day (BID) | Status: DC
Start: 2017-02-04 — End: 2017-02-04

## 2017-02-04 MED ORDER — VANCOMYCIN HCL IN DEXTROSE 1-5 GM/200ML-% IV SOLN
1000.0000 mg | Freq: Once | INTRAVENOUS | Status: AC
  Administered 2017-02-04: 1000 mg via INTRAVENOUS
  Filled 2017-02-04: qty 200

## 2017-02-04 MED ORDER — FUROSEMIDE 10 MG/ML IJ SOLN
40.0000 mg | Freq: Once | INTRAMUSCULAR | Status: AC
  Administered 2017-02-04: 40 mg via INTRAVENOUS
  Filled 2017-02-04: qty 4

## 2017-02-04 MED ORDER — DEXTROSE 5 % IV SOLN
2.0000 g | Freq: Once | INTRAVENOUS | Status: AC
  Administered 2017-02-04: 2 g via INTRAVENOUS
  Filled 2017-02-04: qty 2

## 2017-02-04 MED ORDER — BRIMONIDINE TARTRATE 0.2 % OP SOLN
1.0000 [drp] | Freq: Two times a day (BID) | OPHTHALMIC | Status: DC
Start: 2017-02-04 — End: 2017-02-09
  Administered 2017-02-04 – 2017-02-08 (×8): 1 [drp] via OPHTHALMIC
  Filled 2017-02-04: qty 5

## 2017-02-04 MED ORDER — CLOPIDOGREL BISULFATE 75 MG PO TABS
75.0000 mg | ORAL_TABLET | ORAL | Status: DC
  Administered 2017-02-04 – 2017-02-06 (×3): 75 mg via ORAL
  Filled 2017-02-04 (×5): qty 1

## 2017-02-04 MED ORDER — DEXTROSE 5 % IV SOLN
2.0000 g | Freq: Two times a day (BID) | INTRAVENOUS | Status: DC
  Administered 2017-02-04 – 2017-02-08 (×8): 2 g via INTRAVENOUS
  Filled 2017-02-04 (×10): qty 2

## 2017-02-04 MED ORDER — ACETAMINOPHEN 325 MG PO TABS
650.0000 mg | ORAL_TABLET | Freq: Four times a day (QID) | ORAL | Status: DC | PRN
  Filled 2017-02-04: qty 2

## 2017-02-04 MED ORDER — AZATHIOPRINE 50 MG PO TABS
50.0000 mg | ORAL_TABLET | Freq: Two times a day (BID) | ORAL | Status: DC
  Administered 2017-02-04 – 2017-02-08 (×9): 50 mg via ORAL
  Filled 2017-02-04 (×10): qty 1

## 2017-02-04 MED ORDER — LEVOTHYROXINE SODIUM 75 MCG PO TABS
175.0000 ug | ORAL_TABLET | Freq: Every day | ORAL | Status: DC
  Administered 2017-02-05 – 2017-02-08 (×4): 175 ug via ORAL
  Filled 2017-02-04 (×5): qty 1

## 2017-02-04 MED ORDER — MOMETASONE FURO-FORMOTEROL FUM 100-5 MCG/ACT IN AERO
2.0000 | INHALATION_SPRAY | Freq: Two times a day (BID) | RESPIRATORY_TRACT | Status: DC
  Administered 2017-02-04 – 2017-02-08 (×9): 2 via RESPIRATORY_TRACT
  Filled 2017-02-04: qty 8.8

## 2017-02-04 NOTE — ED Notes (Signed)
Delay in lab draw,  Pt receiving peri care at this time. 

## 2017-02-04 NOTE — ED Triage Notes (Signed)
Patient arrives via EMS from [facility]; Patient A&ox 4 on arrival. Patient has had c/o SOB x 2 days with cough and has gotten worse; patient arrives on CPAP and transferred to Ed CPAP; Patient has hx of COPD and Afib; pt arrivals with Afib RVR at times;Pt c/o 8/10 back pain; EMS reports patient had c/o chest pain prior to arrival. Pt denies chest pain on arrival.Patient has daughter that is in route via EMS-Monique,RN

## 2017-02-04 NOTE — ED Provider Notes (Signed)
[facility] EMERGENCY DEPARTMENT Provider Note   CSN: [id number] Arrival date & time: 02/04/17  0109     History   Chief Complaint No chief complaint on file.   HPI Travis Ross is a 83 y.o. male.  The history is provided by the patient.  He has history of COPD, hyperlipidemia, myasthenia gravis, atrial fibrillation.  For the last 2 days, he has had a nonproductive cough and worsening shortness of breath.  He does get slight relief from his home nebulizer treatments.  He has had subjective fever as well as chills and sweats.  He denies any chest pain, heaviness, tightness, pressure.  There is been no nausea or vomiting.  He was brought in by ambulance who placed him on CPAP and gave him methylprednisolone intravenously.  Past Medical History:  Diagnosis Date  . COPD with chronic bronchitis and emphysema ([place])   . Essential hypertension   . Femur fracture, right ([place])    Related to a fall. Periprosthetic distal femur fracture (close to the right knee prosthesis).  > Initial plans were to treat with brace, however, has now progressed and may require surgery  . GERD (gastroesophageal reflux disease)   . Glaucoma   . Hyperlipidemia   . Hypothyroidism (acquired)    On Levothyroxine  . Myasthenia gravis ([place])    Now (prior to recent femur fracture) was relegated to maneuvering himself around on a Rolling Walker (Rollator) using his feet & sitting on the walker.  Does not have arm strength to mover a wheelchair or transfer.;   . Osteoarthritis    Global osteoarthritis involving back, hips and knees as well as elbows.  . Paresthesia    bilateral upper extremities, very weak ; poor grip  (must use adaptable silverware );   . Permanent atrial fibrillation (HCC)    Long-standing (> 4 yrs). Initially evaluated with Stress Test & Echo - Pt reports that these studies were "normal"    Patient Active Problem List   Diagnosis Date Noted  . Periprosthetic fracture around  internal prosthetic right knee joint 12/15/2016  . Failed total knee arthroplasty ([place]) 12/15/2016  . Permanent atrial fibrillation ([place]) 12/05/2016  . Pre-operative cardiovascular examination 12/05/2016  . Venous stasis of both lower extremities 12/05/2016  . Essential hypertension     Past Surgical History:  Procedure Laterality Date  . BACK SURGERY     x 6  . CARPAL TUNNEL RELEASE    . CHOLECYSTECTOMY    . ELBOW SURGERY  08/2015   Total of 3 surgeries  . REPLACEMENT TOTAL KNEE Bilateral    Right TKA 2003, left TKA 2010    . TOTAL KNEE REVISION Right 12/15/2016   Procedure: RIGHT TOTAL KNEE REVISION;  Surgeon: Gaynelle Arabian, MD;  Location: [facility];  Service: Orthopedics;  Laterality: Right;       Home Medications    Prior to Admission medications   Medication Sig Start Date End Date Taking? Authorizing Provider  acetaminophen (TYLENOL) 325 MG tablet Take 650 mg by mouth every 6 (six) hours as needed for moderate pain.    [provider]  atorvastatin (LIPITOR) 10 MG tablet Take 10 mg by mouth 2 (two) times daily at 8 am and 4 pm.  10/28/16   [provider]  azaTHIOprine (IMURAN) 50 MG tablet Take 50 mg by mouth 2 (two) times daily.    [provider]  Brinzolamide-Brimonidine (SIMBRINZA) 1-0.2 % SUSP Place 1 drop into both eyes 2 (two) times daily.  [provider]  clopidogrel (PLAVIX) 75 MG tablet Take 75 mg by mouth every other day.  10/28/16   [provider]  docusate sodium (COLACE) 100 MG capsule Take 100 mg by mouth 2 (two) times daily.    [provider]  DULERA 100-5 MCG/ACT AERO Take 2 puffs by mouth 2 (two) times daily.  10/10/16   [provider]  furosemide (LASIX) 40 MG tablet Take 40 mg by mouth daily. 11/09/16   [provider]  levothyroxine (SYNTHROID, LEVOTHROID) 175 MCG tablet Take 175 mcg by mouth daily. 10/31/16   [provider]  losartan (COZAAR) 50 MG tablet Take 50 mg by  mouth daily. 10/28/16   [provider]  magnesium hydroxide (MILK OF MAGNESIA) 400 MG/5ML suspension Take 30 mLs by mouth See admin instructions. Take 30 ml by mouth at bedtime every Monday, Wednesday and Friday. Take 30 ml by mouth every 24 hours as needed for constipation.    [provider]  methocarbamol (ROBAXIN) 500 MG tablet Take 1 tablet (500 mg total) by mouth every 6 (six) hours as needed for muscle spasms. 12/20/16   Perkins, Alexzandrew L, PA-C  metoprolol succinate (TOPROL-XL) 50 MG 24 hr tablet Take 50 mg by mouth daily. 10/28/16   [provider]  oxyCODONE (OXY IR/ROXICODONE) 5 MG immediate release tablet Take 1-2 tablets (5-10 mg total) by mouth every 4 (four) hours as needed for moderate pain or severe pain. 12/20/16   Perkins, Alexzandrew L, PA-C  oxyCODONE (OXYCONTIN) 30 MG 12 hr tablet Take 1 tablet (30 mg total) by mouth every 12 (twelve) hours. 12/20/16   Perkins, Alexzandrew L, PA-C  pantoprazole (PROTONIX) 40 MG tablet Take 40 mg by mouth 2 (two) times daily.  10/28/16   [provider]  polycarbophil (FIBERCON) 625 MG tablet Take 1 tablet (625 mg total) by mouth at bedtime. 12/20/16   Perkins, Alexzandrew L, PA-C  polyethylene glycol (MIRALAX / GLYCOLAX) packet Take 17 g by mouth daily.    [provider]  predniSONE (DELTASONE) 20 MG tablet Take 10 mg by mouth daily with breakfast.    [provider]  Psyllium (FIBER) 0.52 g CAPS Take 0.52 g by mouth at bedtime.    [provider]  senna (SENOKOT) 8.6 MG tablet Take 1 tablet by mouth every evening.    [provider]  sertraline (ZOLOFT) 100 MG tablet Take 150 mg by mouth daily.  10/28/16   [provider]  temazepam (RESTORIL) 30 MG capsule Take 30 mg by mouth at bedtime.  11/09/16   [provider]  traMADol (ULTRAM) 50 MG tablet Take 1-2 tablets (50-100 mg total) by mouth every 6 (six) hours as needed (mild pain). 12/20/16   Perkins,  Alexzandrew L, PA-C  TRAVATAN Z 0.004 % SOLN ophthalmic solution Place 1 drop into both eyes at bedtime.  11/09/16   [provider]    Family History Family History  Problem Relation Age of Onset  . Other Mother        Polio  . Heart failure Father   . Arthritis Sister   . Arthritis Brother     Social History Social History   Tobacco Use  . Smoking status: Never Smoker  . Smokeless tobacco: Never Used  Substance Use Topics  . Alcohol use: No  . Drug use: No     Allergies   Patient has no known allergies.   Review of Systems Review of Systems  All other systems  reviewed and are negative.    Physical Exam Updated Vital Signs BP (!) 145/96 (BP Location: Left Arm)   Pulse (!) 186   Temp 99.2 F (37.3 C) (Temporal)   Resp (!) 21   SpO2 95%   Physical Exam  Nursing note and vitals reviewed.  83 year old male, dyspneic and on BiPAP, but in no acute distress. Vital signs are significant for hypertension, tachycardia, tachypnea. Oxygen saturation is 95%, which is normal. Head is normocephalic and atraumatic. PERRLA, EOMI. Oropharynx is clear. Neck is nontender and supple without adenopathy or JVD. Back is nontender and there is no CVA tenderness. Lungs have bibasilar rales with coarse expiratory rhonchi.  There are no definite wheezes appreciated. Chest is nontender. Heart has regular rate and rhythm without murmur. Abdomen is soft, flat, nontender without masses or hepatosplenomegaly and peristalsis is normoactive. Extremities have no cyanosis or edema, full range of motion is present. Skin is warm and dry without rash. Neurologic: Mental status is normal, cranial nerves are intact, there are no motor or sensory deficits.  ED Treatments / Results  Labs (all labs ordered are listed, but only abnormal results are displayed) Labs Reviewed  COMPREHENSIVE METABOLIC PANEL - Abnormal; Notable for the following components:      Result Value   CO2 20 (*)     Glucose, Bld 149 (*)    BUN 27 (*)    Albumin 3.2 (*)    Alkaline Phosphatase 158 (*)    Total Bilirubin 1.4 (*)    All other components within normal limits  CBC WITH DIFFERENTIAL/PLATELET - Abnormal; Notable for the following components:   WBC 15.8 (*)    RBC 4.18 (*)    Hemoglobin 11.9 (*)    HCT 38.2 (*)    RDW 16.3 (*)    Neutro Abs 14.8 (*)    Lymphs Abs 0.5 (*)    All other components within normal limits  PROTIME-INR - Abnormal; Notable for the following components:   Prothrombin Time 16.5 (*)    All other components within normal limits  D-DIMER, QUANTITATIVE (NOT AT [facility]) - Abnormal; Notable for the following components:   D-Dimer, Quant 3.67 (*)    All other components within normal limits  BRAIN NATRIURETIC PEPTIDE - Abnormal; Notable for the following components:   B Natriuretic Peptide 397.0 (*)    All other components within normal limits  TROPONIN I - Abnormal; Notable for the following components:   Troponin I 0.14 (*)    All other components within normal limits  I-STAT CG4 LACTIC ACID, ED - Abnormal; Notable for the following components:   Lactic Acid, Venous 2.56 (*)    All other components within normal limits  CULTURE, BLOOD (ROUTINE X 2)  CULTURE, BLOOD (ROUTINE X 2)  URINALYSIS, ROUTINE W REFLEX MICROSCOPIC  TROPONIN I  I-STAT CG4 LACTIC ACID, ED    EKG  EKG Interpretation  Date/Time:  Saturday February 04 2017 01:12:18 EST Ventricular Rate:  179 PR Interval:    QRS Duration: 109 QT Interval:  296 QTC Calculation: 482 R Axis:   0 Text Interpretation:  Atrial fibrillation with rapid ventricular response Multiform ventricular premature complexes Artifact in lead(s) I II III aVR aVL aVF V2 Reconfirmed by Delora Fuel ([id number]) on 02/04/2017 1:25:37 AM       Radiology Ct Angio Chest Pe W And/or Wo Contrast  Result Date: 02/04/2017 CLINICAL DATA:  83 y/o M; two days of shortness of breath with cough. EXAM: CT ANGIOGRAPHY CHEST  WITH CONTRAST  TECHNIQUE: Multidetector CT imaging of the chest was performed using the standard protocol during bolus administration of intravenous contrast. Multiplanar CT image reconstructions and MIPs were obtained to evaluate the vascular anatomy. CONTRAST:  124mL ISOVUE-370 IOPAMIDOL (ISOVUE-370) INJECTION 76% COMPARISON:  None. FINDINGS: Cardiovascular: Moderate to severe cardiomegaly. Moderate coronary artery calcification. Normal caliber thoracic aorta with moderate calcific atherosclerosis. Satisfactory opacification of the pulmonary arteries. No pulmonary embolus. Mediastinum/Nodes: No enlarged mediastinal, hilar, or axillary lymph nodes. Thyroid gland, trachea, and esophagus demonstrate no significant findings. Lungs/Pleura: Clustered nodules, ground-glass opacities, and dense consolidations present throughout along with greatest in the lower lobes bilaterally in the right middle lobe compatible with multifocal diffuse peribronchial thickening and scattered mucous plugging. Pneumonia. Upper Abdomen: No acute abnormality. Musculoskeletal: Calcifications within the bilateral glenohumeral joint spaces may represent chronic synovitis or crystal deposition disease. T4-5 endplate eburnation, vacuum phenomenon, without paravertebral edema, likely representing sequelae of chronic erosive osteoarthritis or spondyloarthropathy. Multilevel thoracic spine syndesmophytes. Review of the MIP images confirms the above findings. IMPRESSION: 1. No pulmonary embolus identified. 2. Multifocal pneumonia greatest in lower lobes and right middle lobe. 3. Moderate to severe cardiomegaly. 4. Moderate coronary artery and aortic calcific atherosclerosis. Electronically Signed   By: Kristine Garbe M.D.   On: 02/04/2017 05:49   Dg Chest Port 1 View  Result Date: 02/04/2017 CLINICAL DATA:  Shortness of breath EXAM: PORTABLE CHEST 1 VIEW COMPARISON:  None. FINDINGS: Cardiomegaly. The hila and mediastinum are normal. No pneumothorax.  Focal infiltrate seen in the medial right lung base. The left retrocardiac region is not well assessed on a portable chest x-ray. IMPRESSION: 1. Right lower lobe infiltrate. Left retrocardiac region not well assessed. Recommend follow-up to resolution. Electronically Signed   By: Dorise Bullion III M.D   On: 02/04/2017 02:14    Procedures Procedures (including critical care time) CRITICAL CARE Performed by: Delora Fuel Total critical care time: 65 minutes Critical care time was exclusive of separately billable procedures and treating other patients. Critical care was necessary to treat or prevent imminent or life-threatening deterioration. Critical care was time spent personally by me on the following activities: development of treatment plan with patient and/or surrogate as well as nursing, discussions with consultants, evaluation of patient's response to treatment, examination of patient, obtaining history from patient or surrogate, ordering and performing treatments and interventions, ordering and review of laboratory studies, ordering and review of radiographic studies, pulse oximetry and re-evaluation of patient's condition.  Medications Ordered in ED Medications  ipratropium-albuterol (DUONEB) 0.5-2.5 (3) MG/3ML nebulizer solution 3 mL (3 mLs Nebulization Not Given 02/04/17 0314)  diltiazem (CARDIZEM) 1 mg/mL load via infusion 15 mg (15 mg Intravenous Bolus from Bag 02/04/17 0234)    And  diltiazem (CARDIZEM) 100 mg in dextrose 5 % 100 mL (1 mg/mL) infusion (12.5 mg/hr Intravenous Rate/Dose Change 02/04/17 0447)  vancomycin (VANCOCIN) IVPB 1000 mg/200 mL premix (not administered)  furosemide (LASIX) injection 40 mg (40 mg Intravenous Given 02/04/17 0518)  iopamidol (ISOVUE-370) 76 % injection (100 mLs  Contrast Given 02/04/17 0448)  ceFEPIme (MAXIPIME) 2 g in dextrose 5 % 50 mL IVPB (2 g Intravenous New Bag/Given 02/04/17 0656)     Initial Impression / Assessment and Plan / ED Course  I  have reviewed the triage vital signs and the nursing notes.  Pertinent labs & imaging results that were available during my care of the patient were reviewed by me and considered in my medical decision making (see chart for details).  Dyspnea  which appears to be exacerbation of underlying COPD.  Atrial fibrillation with rapid ventricular response.  Old records are reviewed, and atrial fibrillation had been present previously and is not on any anticoagulation treatment.  Prior atrial fibrillation had controlled ventricular response.  Need to consider possibility of pneumonia.  No physical findings to suggest this, but need to consider CHF.  Also, on review of past records, he had revision of total knee replacement done approximately 7 weeks ago, so he would be at risk for pulmonary embolism.  He will be given albuterol with ipratropium.  Chest x-ray will be obtained.  Will need to be started on medication for rate control.  Heart rate is come down modestly with diltiazem.  Blood pressures remained adequate.  Chest x-ray shows right lower lobe infiltrate.  He is started on antibiotics for healthcare associated pneumonia.  D-dimer is come back elevated and he is sent for CT angiogram of the chest which actually shows multilobar pneumonia.  BNP is also moderately elevated indicating there is some component of heart failure.  Troponin is mildly elevated and is felt to represent demand ischemia rather than STEMI.  Will check repeat troponin to make sure it is not rising.  Case is discussed with Dr Jari Favre of internal medicine teaching service who agrees to admit the patient..  Final Clinical Impressions(s) / ED Diagnoses   Final diagnoses:  HCAP (healthcare-associated pneumonia)  Atrial fibrillation with rapid ventricular response (HCC)  Elevated troponin I level  Elevated brain natriuretic peptide (BNP) level  Normochromic normocytic anemia  Subtherapeutic international normalized ratio (INR)    ED  Discharge Orders    None       Delora Fuel, MD 28[birth year]/81 [phone]

## 2017-02-04 NOTE — ED Notes (Signed)
ED Provider at bedside. 

## 2017-02-04 NOTE — ED Notes (Signed)
Patient transported to CT 

## 2017-02-04 NOTE — ED Notes (Signed)
Report attempted to floor, but staff unable to take a Dilt drip at this time. Will notify pt placement and the doc

## 2017-02-04 NOTE — H&P (Signed)
Date: 02/04/2017               Patient Name:  Travis Ross MRN: [id number]  DOB: [birth year] Age / Sex: 83 y.o., male   PCP: [name], MD         Medical Service: Internal Medicine Teaching Service         Attending Physician: Dr. Lucious Groves, DO    First Contact: Dr. Vickki Muff Pager: [phone]  Second Contact: Dr. Alphonzo Grieve Pager: [phone]       After Hours (After 5p/  First Contact Pager: [phone]  weekends / holidays): Second Contact Pager: [phone]   Chief Complaint: Dyspnea    History of Present Illness: 83 yo male with PMH of permanent afib, COPD not on home O2, myasthenia gravis, HLD,  HTN, hypothyroidism, depression, [facility] for increased shortness of breath and a new productive cough for the last three days. He mentions that one of his friends in the facility developed a similar cough a few days prior to him getting it and looking back, he probably should have avoided the friend given his immunosuppression.  His facility started him on levoquin on the December 6th.  He noticed a subjective fever last night, he coughed all night until eventually he said he couldn't breathe and knew he had to go to the hospital.  He denied any nausea, vomiting or diarrhea.  He denies any chest pain and says he never had chest pain only after a coughing fit.    He reports having several bouts of pneumonia over the last few years.  The last time was in July.  He felt very similar then, and was treated with abx but can't remember how long, or which kind.  He also sees Dr. Runell Gess at [facility] in [place] for his myasthenia gravis, he is currently on azathioprine 50mg  BID for this.  Pt states he has been weaned down and completely off in the past but did not tolerate well.  Had increased weakness and could not swallow and it took about 6 months to get back to his baseline.    ED course: pt was given cpap and one dose of IV  methylprednisolone by EMS.  In ED pt was found to be tachypneic, and in A. fib with RVR without hypotension BP 145/96.  He had a white count of 15.8k, lactate of 2.56, blood cultures were taken.  He had a BNP of 397 and a troponin of 0.14.  A d dimer was elevated at 3.47 and a CT angio was performed which demonstrated no PE, multifocal pneumonia greatest in lower lobes and R middle lobe, moderate to severe cardiomegaly and moderate coronary artery and aortic calcific atherosclerosis.    Meds:  Current Meds  Medication Sig  . acetaminophen (TYLENOL) 325 MG tablet Take 650 mg by mouth every 6 (six) hours as needed for moderate pain.  Marland Kitchen aspirin EC 81 MG tablet Take 81 mg by mouth daily.  Marland Kitchen atorvastatin (LIPITOR) 10 MG tablet Take 10 mg by mouth every evening.   Marland Kitchen azaTHIOprine (IMURAN) 50 MG tablet Take 50 mg by mouth 2 (two) times daily.  . benzonatate (TESSALON) 100 MG capsule Take 100 mg by mouth at bedtime.  . Brinzolamide-Brimonidine (SIMBRINZA) 1-0.2 % SUSP Place 1 drop into both eyes 2 (two) times daily.  . clopidogrel (PLAVIX) 75 MG tablet Take 75 mg by mouth every other day.   Marland Kitchen  docusate sodium (COLACE) 100 MG capsule Take 100 mg by mouth 2 (two) times daily.  . DULERA 100-5 MCG/ACT AERO Take 2 puffs by mouth 2 (two) times daily.   . fluticasone (FLONASE) 50 MCG/ACT nasal spray Place 1 spray into both nostrils daily.  . furosemide (LASIX) 40 MG tablet Take 40 mg by mouth daily.  Marland Kitchen guaiFENesin (MUCINEX) 600 MG 12 hr tablet Take 600 mg by mouth 2 (two) times daily.  . Ipratropium-Albuterol (COMBIVENT RESPIMAT) 20-100 MCG/ACT AERS respimat Inhale 1 puff into the lungs 3 (three) times daily.  Marland Kitchen levofloxacin (LEVAQUIN) 500 MG tablet Take 500 mg by mouth daily.  Marland Kitchen levothyroxine (SYNTHROID, LEVOTHROID) 175 MCG tablet Take 175 mcg by mouth daily.  Marland Kitchen losartan (COZAAR) 50 MG tablet Take 50 mg by mouth daily.  . magnesium hydroxide (MILK OF MAGNESIA) 400 MG/5ML suspension Take 30 mLs by mouth See  admin instructions. Take 30 ml by mouth at bedtime every Monday, Wednesday and Friday. Take 30 ml by mouth every 24 hours as needed for constipation.  . methocarbamol (ROBAXIN) 500 MG tablet Take 1 tablet (500 mg total) by mouth every 6 (six) hours as needed for muscle spasms.  . metoprolol succinate (TOPROL-XL) 50 MG 24 hr tablet Take 50 mg by mouth daily.  . Multiple Vitamin (DAILY VITAMIN PO) Take 1 tablet by mouth daily.  Marland Kitchen neomycin-bacitracin-polymyxin (NEOSPORIN) ointment Apply 1 application topically daily. apply to eye  . oxyCODONE (OXY IR/ROXICODONE) 5 MG immediate release tablet Take 1-2 tablets (5-10 mg total) by mouth every 4 (four) hours as needed for moderate pain or severe pain.  Marland Kitchen oxyCODONE (OXYCONTIN) 30 MG 12 hr tablet Take 1 tablet (30 mg total) by mouth every 12 (twelve) hours.  . pantoprazole (PROTONIX) 40 MG tablet Take 40 mg by mouth 2 (two) times daily.   . polyethylene glycol (MIRALAX / GLYCOLAX) packet Take 17 g by mouth daily.  . potassium chloride SA (K-DUR,KLOR-CON) 20 MEQ tablet Take 20 mEq by mouth 3 (three) times a week. Monday, Wednesday and Friday  . predniSONE (DELTASONE) 10 MG tablet Take 20 mg by mouth daily with breakfast.  . predniSONE (DELTASONE) 20 MG tablet Take 10 mg by mouth daily with breakfast.  . Psyllium (FIBER) 0.52 g CAPS Take 0.52 g by mouth at bedtime.  . senna (SENOKOT) 8.6 MG tablet Take 1 tablet by mouth every evening.  . sertraline (ZOLOFT) 100 MG tablet Take 150 mg by mouth daily.   . temazepam (RESTORIL) 30 MG capsule Take 30 mg by mouth at bedtime.   . traMADol (ULTRAM) 50 MG tablet Take 1-2 tablets (50-100 mg total) by mouth every 6 (six) hours as needed (mild pain).  . TRAVATAN Z 0.004 % SOLN ophthalmic solution Place 1 drop into both eyes at bedtime.      Allergies: Allergies as of 02/04/2017  . (No Known Allergies)   Past Medical History:  Diagnosis Date  . COPD with chronic bronchitis and emphysema ([place])   . Essential  hypertension   . Femur fracture, right ([place])    Related to a fall. Periprosthetic distal femur fracture (close to the right knee prosthesis).  > Initial plans were to treat with brace, however, has now progressed and may require surgery  . GERD (gastroesophageal reflux disease)   . Glaucoma   . Hyperlipidemia   . Hypothyroidism (acquired)    On Levothyroxine  . Myasthenia gravis ([place])    Now (prior to recent femur fracture) was relegated to maneuvering himself around  on a Rolling Walker (Rollator) using his feet & sitting on the walker.  Does not have arm strength to mover a wheelchair or transfer.;   . Osteoarthritis    Global osteoarthritis involving back, hips and knees as well as elbows.  . Paresthesia    bilateral upper extremities, very weak ; poor grip  (must use adaptable silverware );   . Permanent atrial fibrillation (H[facility])    Long-standing (> 4 yrs). Initially evaluated with Stress Test & Echo - Pt reports that these studies were "normal"    Family History:  Family History  Problem Relation Age of Onset  . Other Mother        Polio  . Heart failure Father   . Arthritis Sister   . Arthritis Brother     Social History:  Social History   Tobacco Use  . Smoking status: Never Smoker  . Smokeless tobacco: Never Used  Substance Use Topics  . Alcohol use: No  . Drug use: No    Review of Systems: A complete ROS was negative except as per HPI.   Physical Exam: Blood pressure 136/87, pulse (!) 50, temperature 99.2 F (37.3 C), temperature source Temporal, resp. rate (!) 24, SpO2 96 %. Physical Exam  Constitutional: He is oriented to person, place, and time. He appears well-developed and well-nourished.  Eyes: Right eye exhibits no discharge. Left eye exhibits no discharge. No scleral icterus.  Neck: JVD (7-8cm JVD) present.  Cardiovascular: Normal rate, normal heart sounds and intact distal pulses. An irregularly irregular rhythm present. Exam reveals no gallop and no  friction rub.  No murmur heard. Pulmonary/Chest: Effort normal. No respiratory distress. He has wheezes (very mild end expiratory wheeze diffuse). He has rhonchi in the right middle field. He has no rales.  Bronchial breath sounds throughout  Abdominal: Soft. Bowel sounds are normal. He exhibits no distension and no mass. There is no tenderness. There is no guarding.  Musculoskeletal:       Right lower leg: He exhibits no edema.       Left lower leg: He exhibits no edema.  Neurological: He is alert and oriented to person, place, and time.  Skin:  Stasis dermatitis on distal bilateral lower extremities     EKG: personally reviewed my interpretation is Afib w/RVR  CXR: personally reviewed my interpretation is RLL infiltrate concerning for pneumonia  Assessment & Plan by Problem: Active Problems:   Community acquired pneumonia  Patient Profile: 83 yo male with PMH of permanent afib, COPD not on home O2, myasthenia gravis on azathioprine, HLD,  HTN, hypothyroidism, depression, comes to the ED from [facility] for increased shortness of breath and a new productive cough for the last three days found to have multifocal pneumonia worse on the right.      Community aquired pneumonia: pt with history of pneumonias due to immunosuppression by azathioprine his treatment for MG.  Found to have new multifocal pneumonia in lower lobes worse in R middle lobe.    -Treated as hospital acquired pneumonia given pts recent hospital stay for knee replacement and his immunosuppresion. -Continue broad spec abx  Permanent Atrial fibrillation: pt was on warfarin but taken off due to consistently being supratherapeutic, he is now on aspirin 81mg  every day plavix 75mg  every other day.  -Unusual anticoagulation regimen, will look into this more closely, continue for now.   -diltiazem gtt, will switch back to metoprolol when pt more stable  COPD: not on O2 at [facility], well  controlled on Dulera, minimal wheezing on exam   -begin with scheduled duoneb therapy every 6 hours transition to PRN with home meds  Myasthenia Gravis: on azathioprine 50mg  BID, not taking prednisone,   -continue azathioprine  HTN: chronic, stable  -will continue home meds as pt continues to stabilize  Degenerative disc disease: pt with cervical and lumbar disc disease, with prior surgical intervention  -working to verify pt is on oxycontin for this.  Pharmacy has no record of pt being prescribed this, have reached out to facility but no call back yet, will verify.    Dispo: Admit patient to Inpatient with expected length of stay greater than 2 midnights.  Signed: Katherine Roan, MD 02/04/2017, 9:52 AM  Vickki Muff MD PGY-1 Internal Medicine Pager # [phone]

## 2017-02-04 NOTE — ED Notes (Signed)
Lunch tray ordered; regular diet 

## 2017-02-04 NOTE — Progress Notes (Addendum)
Pharmacy Antibiotic Note  Travis Ross is a 83 y.o. male admitted on 02/04/2017 with pneumonia.  Pharmacy has been consulted for vancomycin and cefepime dosing.  Plan: Vancomycin 750mg  IV q12 (he has already received 1g in the ED earlier today) Cefepime 2g IV q12h Follow c/s, clinical progression, renal function, level at SS  Height: 6\' 1"  (185.4 cm) Weight: 200 lb 9.9 oz (91 kg) IBW/kg (Calculated) : 79.9  Temp (24hrs), Avg:98.7 F (37.1 C), Min:98.2 F (36.8 C), Max:99.2 F (37.3 C)  Recent Labs  Lab 02/04/17 0115 02/04/17 0139 02/04/17 0318  WBC 15.8*  --   --   CREATININE 0.98  --   --   LATICACIDVEN  --  2.56* 1.43    Estimated Creatinine Clearance: 58.9 mL/min (by C-G formula based on SCr of 0.98 mg/dL).    No Known Allergies  Antimicrobials this admission: Vancomycin 12/8 >>  Cefepime 12/8 >>   Dose adjustments this admission: n/a  Microbiology results: 12/8 BCx: sent 12/8 MRSA PCR: sent  Thank you for allowing pharmacy to be a part of this patient's care.  Jaylene Schrom D. Lousie Calico, PharmD, BCPS Clinical Pharmacist [phone] 02/04/2017 5:19 PM

## 2017-02-04 NOTE — H&P (Signed)
Date: 02/04/2017               Patient Name:  Travis Ross MRN: [id number]  DOB: Mar 24, 1926 Age / Sex: 83 y.o., male   PCP: [name], MD              Medical Service: Internal Medicine Teaching Service              Attending Physician: Dr. Lucious Groves, DO    First Contact: Marilynne Halsted, MS3 Pager: [phone]  Second Contact: Dr. Shan Levans Pager: [phone]  Third Contact Dr. Jari Favre Pager: [phone]       After Hours (After 5p/  First Contact Pager: [phone]  weekends / holidays): Second Contact Pager: [phone]   Chief Complaint: SOB  History of Present Illness: Travis Ross is an 83 year old man with PMH significant for Afib, COPD, HTN, GERD, Myasthenia Gravis, Hypothyroidism, BL total knee arthroplasties with a most recent revision of the R on 10/18 who presents with 3d of worsening short of breath and cough w/ sputum production brought in from his [facility] by EMS. Patient reports he first noticed symptoms a few days ago after spending time with a friend from his community who he noticed to have a prominent cough. He endorses intermittent chest pain only associated with coughing and subjective fevers. He says that his current symptoms feel similar to the last time he had pneumonia in 08/2016 and that these are  different from COPD sx. 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 Patient denies any PND or orthopnea. Pt states he has always never had any heart problems other than his Afib. He does have occasional LE edema, but prior documentation states that this has been from venous insufficiency, patient used to wear compression stockings, but d/t difficulty ambulating and recent right TKA revision, they were discontinued. Patient also has a history of Myasthenia Gravis, well controlled on Azathioprine, most recently prescribed by a doctor in [place]. Pt reports that illnesses can aggravate some of his MG  symptoms, and does endorse some mild dysphagia on admission, reporting that he would need to cut up food into small pieces to be able to swallow.  Patient moved from [place] in August and now living in [place] with the help of his daughters who live nearby. He was recently hospitalized 10/18-10/25 for a revision of this R TKA without complications. His current cardiologist is Dr. Ellyn Hack who has him on Plavix and metoprolol succ for afib, no further cardiac workup has been done.  Patient also has a history of known C3 cervical cord compression which is causing current BL upper extremity and hand weakness. Pt surgical history also includes various lumbar fusions, last in 2000, for which he developed bilateral foot drops post op complicating his ambulatory function.  Meds: Current Facility-Administered Medications  Medication Dose Route Frequency Provider Last Rate Last Dose  . diltiazem (CARDIZEM) 100 mg in dextrose 5 % 100 mL (1 mg/mL) infusion  5-15 mg/hr Intravenous Continuous Delora Fuel, MD 93.5 mL/hr at 02/04/17 0447 12.5 mg/hr at 02/04/17 0447  . ipratropium-albuterol (DUONEB) 0.5-2.5 (3) MG/3ML nebulizer solution 3 mL  3 mL Nebulization Once Delora Fuel, MD      . vancomycin (VANCOCIN) IVPB 1000 mg/200 mL premix  1,000 mg Intravenous Once Delora Fuel, MD       Current Outpatient Medications  Medication Sig Dispense Refill  .  acetaminophen (TYLENOL) 325 MG tablet Take 650 mg by mouth every 6 (six) hours as needed for moderate pain.    Marland Kitchen aspirin EC 81 MG tablet Take 81 mg by mouth daily.    Marland Kitchen atorvastatin (LIPITOR) 10 MG tablet Take 10 mg by mouth every evening.     Marland Kitchen azaTHIOprine (IMURAN) 50 MG tablet Take 50 mg by mouth 2 (two) times daily.    . benzonatate (TESSALON) 100 MG capsule Take 100 mg by mouth at bedtime.    . Brinzolamide-Brimonidine (SIMBRINZA) 1-0.2 % SUSP Place 1 drop into both eyes 2 (two) times daily.    . clopidogrel (PLAVIX) 75 MG tablet Take 75 mg by mouth every  other day.     . docusate sodium (COLACE) 100 MG capsule Take 100 mg by mouth 2 (two) times daily.    . DULERA 100-5 MCG/ACT AERO Take 2 puffs by mouth 2 (two) times daily.     . fluticasone (FLONASE) 50 MCG/ACT nasal spray Place 1 spray into both nostrils daily.    . furosemide (LASIX) 40 MG tablet Take 40 mg by mouth daily.    Marland Kitchen guaiFENesin (MUCINEX) 600 MG 12 hr tablet Take 600 mg by mouth 2 (two) times daily.    . Ipratropium-Albuterol (COMBIVENT RESPIMAT) 20-100 MCG/ACT AERS respimat Inhale 1 puff into the lungs 3 (three) times daily.    Marland Kitchen levofloxacin (LEVAQUIN) 500 MG tablet Take 500 mg by mouth daily.    Marland Kitchen levothyroxine (SYNTHROID, LEVOTHROID) 175 MCG tablet Take 175 mcg by mouth daily.    Marland Kitchen losartan (COZAAR) 50 MG tablet Take 50 mg by mouth daily.    . magnesium hydroxide (MILK OF MAGNESIA) 400 MG/5ML suspension Take 30 mLs by mouth See admin instructions. Take 30 ml by mouth at bedtime every Monday, Wednesday and Friday. Take 30 ml by mouth every 24 hours as needed for constipation.    . methocarbamol (ROBAXIN) 500 MG tablet Take 1 tablet (500 mg total) by mouth every 6 (six) hours as needed for muscle spasms. 80 tablet 0  . metoprolol succinate (TOPROL-XL) 50 MG 24 hr tablet Take 50 mg by mouth daily.    . Multiple Vitamin (DAILY VITAMIN PO) Take 1 tablet by mouth daily.    Marland Kitchen neomycin-bacitracin-polymyxin (NEOSPORIN) ointment Apply 1 application topically daily. apply to eye    . oxyCODONE (OXY IR/ROXICODONE) 5 MG immediate release tablet Take 1-2 tablets (5-10 mg total) by mouth every 4 (four) hours as needed for moderate pain or severe pain. 80 tablet 0  . oxyCODONE (OXYCONTIN) 30 MG 12 hr tablet Take 1 tablet (30 mg total) by mouth every 12 (twelve) hours. 28 each 0  . pantoprazole (PROTONIX) 40 MG tablet Take 40 mg by mouth 2 (two) times daily.     . polyethylene glycol (MIRALAX / GLYCOLAX) packet Take 17 g by mouth daily.    . potassium chloride SA (K-DUR,KLOR-CON) 20 MEQ tablet  Take 20 mEq by mouth 3 (three) times a week. Monday, Wednesday and Friday    . predniSONE (DELTASONE) 10 MG tablet Take 20 mg by mouth daily with breakfast.    . predniSONE (DELTASONE) 20 MG tablet Take 10 mg by mouth daily with breakfast.    . Psyllium (FIBER) 0.52 g CAPS Take 0.52 g by mouth at bedtime.    . senna (SENOKOT) 8.6 MG tablet Take 1 tablet by mouth every evening.    . sertraline (ZOLOFT) 100 MG tablet Take 150 mg by mouth daily.     Marland Kitchen  temazepam (RESTORIL) 30 MG capsule Take 30 mg by mouth at bedtime.     . traMADol (ULTRAM) 50 MG tablet Take 1-2 tablets (50-100 mg total) by mouth every 6 (six) hours as needed (mild pain). 56 tablet 0  . TRAVATAN Z 0.004 % SOLN ophthalmic solution Place 1 drop into both eyes at bedtime.     . polycarbophil (FIBERCON) 625 MG tablet Take 1 tablet (625 mg total) by mouth at bedtime. 30 tablet 0    Allergies: Allergies as of 02/04/2017  . (No Known Allergies)   Past Medical History:  Diagnosis Date  . COPD with chronic bronchitis and emphysema ([place])   . Essential hypertension   . Femur fracture, right ([place])    Related to a fall. Periprosthetic distal femur fracture (close to the right knee prosthesis).  > Initial plans were to treat with brace, however, has now progressed and may require surgery  . GERD (gastroesophageal reflux disease)   . Glaucoma   . Hyperlipidemia   . Hypothyroidism (acquired)    On Levothyroxine  . Myasthenia gravis ([place])    Now (prior to recent femur fracture) was relegated to maneuvering himself around on a Rolling Walker (Rollator) using his feet & sitting on the walker.  Does not have arm strength to mover a wheelchair or transfer.;   . Osteoarthritis    Global osteoarthritis involving back, hips and knees as well as elbows.  . Paresthesia    bilateral upper extremities, very weak ; poor grip  (must use adaptable silverware );   . Permanent atrial fibrillation (HCC)    Long-standing (> 4 yrs). Initially evaluated  with Stress Test & Echo - Pt reports that these studies were "normal"   Past Surgical History:  Procedure Laterality Date  . BACK SURGERY     x 6  . CARPAL TUNNEL RELEASE    . CHOLECYSTECTOMY    . ELBOW SURGERY  08/2015   Total of 3 surgeries  . REPLACEMENT TOTAL KNEE Bilateral    Right TKA 2003, left TKA 2010    . TOTAL KNEE REVISION Right 12/15/2016   Procedure: RIGHT TOTAL KNEE REVISION;  Surgeon: Gaynelle Arabian, MD;  Location: [facility];  Service: Orthopedics;  Laterality: Right;   Family History  Problem Relation Age of Onset  . Other Mother        Polio  . Heart failure Father   . Arthritis Sister   . Arthritis Brother    Social History   Socioeconomic History  . Marital status: Widowed    Spouse name: Not on file  . Number of children: 6  . Years of education: 5  . Highest education level: Not on file  Social Needs  . Financial resource strain: Not on file  . Food insecurity - worry: Not on file  . Food insecurity - inability: Not on file  . Transportation needs - medical: Not on file  . Transportation needs - non-medical: Not on file  Occupational History  . 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 Smoking status: Never Smoker  . Smokeless tobacco: Never Used  Substance and Sexual Activity  . Alcohol use: No  . Drug use: No  . Sexual activity: No  Other Topics Concern  . Not on file  Social History Narrative   He is a widower, who is recently moved down to [place] to live in assisted living facility to his daughter.   He  has 6 total children, 7 grandchildren and 2 great-grandchildren.    He currently lives in assisted living facility, and is essentially immobilized.    Prior to his fall and leg fracture, he was able to maneuver himself around using a rolling walker, however since the fall and fracture, he is now essentially immobile requiring assistance for maneuvering him on a wheelchair. He is right leg is  in an immobilizer brace.       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    Review of Systems: Pertinent items noted in HPI and remainder of comprehensive ROS otherwise negative.  Physical Exam: Blood pressure 136/87, pulse (!) 50, temperature 99.2 F (37.3 C), temperature source Temporal, resp. rate (!) 24, SpO2 96 %. General appearance: non toxic appearing, lying in bed, in NAD Head: normocephalic, atraumatic Eyes: Conjunctiva clear, no proptosis, CN VII intact Neck: no JVD Lungs: diffuse ronchi throughout and expiratory wheezing Heart: regular rate, irregular rhythm Abdomen: soft, NT/ND Extremities: no LE edema, WWP  Skin: stasis dermatitis bilatearlly from malleoli to mid shin Neurologic: alert and oriented x3, decreased 4/5 upper extremity strength, unable to flex BL feet Psych: appropriate affect  Lab results: Reviewed.  Imaging results:  Ct Angio Chest Pe W And/or Wo Contrast  Result Date: 02/04/2017 CLINICAL DATA:  83 y/o M; two days of shortness of breath with cough. EXAM: CT ANGIOGRAPHY CHEST WITH CONTRAST TECHNIQUE: Multidetector CT imaging of the chest was performed using the standard protocol during bolus administration of intravenous contrast. Multiplanar CT image reconstructions and MIPs were obtained to evaluate the vascular anatomy. CONTRAST:  140mL ISOVUE-370 IOPAMIDOL (ISOVUE-370) INJECTION 76% COMPARISON:  None. FINDINGS: Cardiovascular: Moderate to severe cardiomegaly. Moderate coronary artery calcification. Normal caliber thoracic aorta with moderate calcific atherosclerosis. Satisfactory opacification of the pulmonary arteries. No pulmonary embolus. Mediastinum/Nodes: No enlarged mediastinal, hilar, or axillary lymph nodes. Thyroid gland, trachea, and esophagus demonstrate no significant findings. Lungs/Pleura: Clustered nodules, ground-glass opacities, and dense consolidations present throughout along with greatest in the lower lobes bilaterally  in the right middle lobe compatible with multifocal diffuse peribronchial thickening and scattered mucous plugging. Pneumonia. Upper Abdomen: No acute abnormality. Musculoskeletal: Calcifications within the bilateral glenohumeral joint spaces may represent chronic synovitis or crystal deposition disease. T4-5 endplate eburnation, vacuum phenomenon, without paravertebral edema, likely representing sequelae of chronic erosive osteoarthritis or spondyloarthropathy. Multilevel thoracic spine syndesmophytes. Review of the MIP images confirms the above findings. IMPRESSION: 1. No pulmonary embolus identified. 2. Multifocal pneumonia greatest in lower lobes and right middle lobe. 3. Moderate to severe cardiomegaly. 4. Moderate coronary artery and aortic calcific atherosclerosis. Electronically Signed   By: Kristine Garbe M.D.   On: 02/04/2017 05:49   Dg Chest Port 1 View  Result Date: 02/04/2017 CLINICAL DATA:  Shortness of breath EXAM: PORTABLE CHEST 1 VIEW COMPARISON:  None. FINDINGS: Cardiomegaly. The hila and mediastinum are normal. No pneumothorax. Focal infiltrate seen in the medial right lung base. The left retrocardiac region is not well assessed on a portable chest x-ray. IMPRESSION: 1. Right lower lobe infiltrate. Left retrocardiac region not well assessed. Recommend follow-up to resolution. Electronically Signed   By: Dorise Bullion III M.D   On: 02/04/2017 02:14    Other results: EKG: Afib with RVR and PVCs, no ischemic changes.  Assessment & Plan by Problem: Active Problems:   Community acquired pneumonia  Dyspnea 2/2 Community acquired pneumonia Pt presented with with worsening SOB and increased cough with sputum production x3 days. In the ED, lab  work was notable for leukocytosis 15.8, elevated lactic Acid to 2.56 > 1.43 and D dimer 3.67. CXR RLL infiltrate, unable to visualize L retrocardiac region. CT angio ruled out PE, and showed bilateral lower lobe and RML consolidations most  concerning for pneumonia. Consolidations make COPD exacerbation less likely. On exam patient had O2 sat >90% with some audible SOB when speaking, but not in respiratory distress. Plan for broad spectrum antibiotic coverage given pt risk factors of recent hospitalization, immunosuppressive meds, and resident in care facility. - Patient received solumedrol x1 in EMS - S/p x2 day Levofloxacin started on 12/6 for presumed COPD exacerbation - Start IV cefepime 2g - Start IV Vanc 1000mg  - Start IV azithromycin 500mg  - Trend troponin 0.14 > 0.16 - Blood Cx; pending - U/A wnl  Demand ischemia 2/2 Afib On admission patient was tachycardic to 174, EKG consistent with Afib with RVR likely exacerbated by current infection. HR improving after started on IV diltiazem to 112-118. Patient was also found to have elevated troponin of 0.14 >0.16 and BNP 397 likely secondary to increased demand from Afib as patient has no signs of ischemic disease on EKG. Patient denies any current chest pain or palpitations, appears largely euvolemic on exam, though s/p 40mg  IV Lasix, less concerning for primary heart failure exacerbation though may have underlying HF from longstanding Afib and h/o HTN.   - Continue IV Diltiazem for rate control - Obtain echo to assess baseline heart function - Continue plavix 75mg , ASA 81mg   COPD Patient received CPAP in EMS and bipap in ED with improvement in respiratory status. Currently stable and O2sat >90% ORA. Continue home COPD regimen below: - Duoneb q6hrs  - Dulera  HTN Patient was hypertensive on arrival to the hospital with BP ranging 165-109/126-75, chronic history. Will continue close monitoring and restart home meds. - Continue Metoprolol succinate 50mg  qd - Continue losartan 50mg  qd  Chronic medical problems: Myasthenia gravis; Azathioprine 50mg  qd, Anxiety; Sertraline 150mg , Muscle spasms; Temazepam 30mg  qhs, Hypothyroidism; levothyroxine 175 mcg qd, GERD; pantoprazole 40mg   BID, chronic spinal stenosis; pt taking oxycontin, working to verify rx for this, HLD; continue atorvastatin 10mg .  This is a Careers information officer Note.  The care of the patient was discussed with Dr. Shan Levans and the assessment and plan was formulated with their assistance.  Please see their note for official documentation of the patient encounter.   Signed: Marilynne Halsted, Medical Student 02/04/2017, 7:59 AM

## 2017-02-04 NOTE — Progress Notes (Signed)
CSW received consult for placement, patient came to [facility] ED from [facility] of [place]). CSW spoke with nursing staff at [facility] to confirm that patient can return to ALF, RN stated that patient can return at any time after discharge. Nurse asked if patient was being admitted, CSW informed her that it was unsure at this time according to notes in chart. CSW attempted call to [facility] ED RN to provide update but was unsuccessful.  CSW to continue to follow for discharge planning.  Madilyn Fireman, MSW, LCSW-A Weekend Clinical Social Worker [phone]

## 2017-02-05 DIAGNOSIS — Z7902 Long term (current) use of antithrombotics/antiplatelets: Secondary | ICD-10-CM

## 2017-02-05 DIAGNOSIS — F419 Anxiety disorder, unspecified: Secondary | ICD-10-CM | POA: Diagnosis present

## 2017-02-05 DIAGNOSIS — R7989 Other specified abnormal findings of blood chemistry: Secondary | ICD-10-CM | POA: Diagnosis not present

## 2017-02-05 DIAGNOSIS — M6281 Muscle weakness (generalized): Secondary | ICD-10-CM | POA: Diagnosis not present

## 2017-02-05 DIAGNOSIS — R1312 Dysphagia, oropharyngeal phase: Secondary | ICD-10-CM | POA: Diagnosis not present

## 2017-02-05 DIAGNOSIS — J189 Pneumonia, unspecified organism: Secondary | ICD-10-CM | POA: Diagnosis not present

## 2017-02-05 DIAGNOSIS — I110 Hypertensive heart disease with heart failure: Secondary | ICD-10-CM

## 2017-02-05 DIAGNOSIS — K210 Gastro-esophageal reflux disease with esophagitis: Secondary | ICD-10-CM | POA: Diagnosis not present

## 2017-02-05 DIAGNOSIS — E785 Hyperlipidemia, unspecified: Secondary | ICD-10-CM

## 2017-02-05 DIAGNOSIS — I509 Heart failure, unspecified: Secondary | ICD-10-CM

## 2017-02-05 DIAGNOSIS — Z8701 Personal history of pneumonia (recurrent): Secondary | ICD-10-CM

## 2017-02-05 DIAGNOSIS — M5030 Other cervical disc degeneration, unspecified cervical region: Secondary | ICD-10-CM

## 2017-02-05 DIAGNOSIS — M5136 Other intervertebral disc degeneration, lumbar region: Secondary | ICD-10-CM | POA: Diagnosis not present

## 2017-02-05 DIAGNOSIS — K219 Gastro-esophageal reflux disease without esophagitis: Secondary | ICD-10-CM | POA: Diagnosis present

## 2017-02-05 DIAGNOSIS — E039 Hypothyroidism, unspecified: Secondary | ICD-10-CM

## 2017-02-05 DIAGNOSIS — J440 Chronic obstructive pulmonary disease with acute lower respiratory infection: Secondary | ICD-10-CM | POA: Diagnosis not present

## 2017-02-05 DIAGNOSIS — I878 Other specified disorders of veins: Secondary | ICD-10-CM

## 2017-02-05 DIAGNOSIS — Z7982 Long term (current) use of aspirin: Secondary | ICD-10-CM | POA: Diagnosis not present

## 2017-02-05 DIAGNOSIS — I4891 Unspecified atrial fibrillation: Secondary | ICD-10-CM | POA: Diagnosis not present

## 2017-02-05 DIAGNOSIS — I5020 Unspecified systolic (congestive) heart failure: Secondary | ICD-10-CM | POA: Diagnosis not present

## 2017-02-05 DIAGNOSIS — G7000 Myasthenia gravis without (acute) exacerbation: Secondary | ICD-10-CM | POA: Diagnosis not present

## 2017-02-05 DIAGNOSIS — Z96653 Presence of artificial knee joint, bilateral: Secondary | ICD-10-CM | POA: Diagnosis present

## 2017-02-05 DIAGNOSIS — I5040 Unspecified combined systolic (congestive) and diastolic (congestive) heart failure: Secondary | ICD-10-CM | POA: Diagnosis not present

## 2017-02-05 DIAGNOSIS — L8992 Pressure ulcer of unspecified site, stage 2: Secondary | ICD-10-CM | POA: Diagnosis not present

## 2017-02-05 DIAGNOSIS — I482 Chronic atrial fibrillation: Secondary | ICD-10-CM | POA: Diagnosis not present

## 2017-02-05 DIAGNOSIS — H409 Unspecified glaucoma: Secondary | ICD-10-CM | POA: Diagnosis present

## 2017-02-05 DIAGNOSIS — L89153 Pressure ulcer of sacral region, stage 3: Secondary | ICD-10-CM | POA: Diagnosis not present

## 2017-02-05 DIAGNOSIS — Z79899 Other long term (current) drug therapy: Secondary | ICD-10-CM | POA: Diagnosis not present

## 2017-02-05 DIAGNOSIS — R0602 Shortness of breath: Secondary | ICD-10-CM | POA: Diagnosis present

## 2017-02-05 DIAGNOSIS — I5023 Acute on chronic systolic (congestive) heart failure: Secondary | ICD-10-CM | POA: Diagnosis not present

## 2017-02-05 DIAGNOSIS — R278 Other lack of coordination: Secondary | ICD-10-CM | POA: Diagnosis not present

## 2017-02-05 DIAGNOSIS — Y95 Nosocomial condition: Secondary | ICD-10-CM | POA: Diagnosis present

## 2017-02-05 DIAGNOSIS — Z8249 Family history of ischemic heart disease and other diseases of the circulatory system: Secondary | ICD-10-CM | POA: Diagnosis not present

## 2017-02-05 DIAGNOSIS — R293 Abnormal posture: Secondary | ICD-10-CM | POA: Diagnosis not present

## 2017-02-05 DIAGNOSIS — I872 Venous insufficiency (chronic) (peripheral): Secondary | ICD-10-CM

## 2017-02-05 DIAGNOSIS — Z7951 Long term (current) use of inhaled steroids: Secondary | ICD-10-CM | POA: Diagnosis not present

## 2017-02-05 DIAGNOSIS — F329 Major depressive disorder, single episode, unspecified: Secondary | ICD-10-CM

## 2017-02-05 DIAGNOSIS — J449 Chronic obstructive pulmonary disease, unspecified: Secondary | ICD-10-CM | POA: Diagnosis not present

## 2017-02-05 DIAGNOSIS — I248 Other forms of acute ischemic heart disease: Secondary | ICD-10-CM | POA: Diagnosis not present

## 2017-02-05 DIAGNOSIS — R261 Paralytic gait: Secondary | ICD-10-CM | POA: Diagnosis not present

## 2017-02-05 DIAGNOSIS — R1310 Dysphagia, unspecified: Secondary | ICD-10-CM | POA: Diagnosis present

## 2017-02-05 DIAGNOSIS — Z79890 Hormone replacement therapy (postmenopausal): Secondary | ICD-10-CM | POA: Diagnosis not present

## 2017-02-05 LAB — CBC
HCT: 29.1 % — ABNORMAL LOW (ref 39.0–52.0)
HEMOGLOBIN: 9 g/dL — AB (ref 13.0–17.0)
MCH: 28.1 pg (ref 26.0–34.0)
MCHC: 30.9 g/dL (ref 30.0–36.0)
MCV: 90.9 fL (ref 78.0–100.0)
PLATELETS: 157 10*3/uL (ref 150–400)
RBC: 3.2 MIL/uL — ABNORMAL LOW (ref 4.22–5.81)
RDW: 16.3 % — AB (ref 11.5–15.5)
WBC: 10 10*3/uL (ref 4.0–10.5)

## 2017-02-05 LAB — BASIC METABOLIC PANEL
ANION GAP: 9 (ref 5–15)
BUN: 34 mg/dL — ABNORMAL HIGH (ref 6–20)
CALCIUM: 8.5 mg/dL — AB (ref 8.9–10.3)
CO2: 25 mmol/L (ref 22–32)
CREATININE: 1.02 mg/dL (ref 0.61–1.24)
Chloride: 102 mmol/L (ref 101–111)
GFR calc Af Amer: >60 mL/min (ref >60)
GLUCOSE: 133 mg/dL — AB (ref 65–99)
Potassium: 4.1 mmol/L (ref 3.5–5.1)
Sodium: 136 mmol/L (ref 135–145)

## 2017-02-05 MED ORDER — METOPROLOL SUCCINATE ER 50 MG PO TB24
50.0000 mg | ORAL_TABLET | Freq: Every day | ORAL | Status: DC
  Administered 2017-02-05 – 2017-02-08 (×4): 50 mg via ORAL
  Filled 2017-02-05 (×4): qty 1

## 2017-02-05 MED ORDER — CALCIUM POLYCARBOPHIL 625 MG PO TABS
625.0000 mg | ORAL_TABLET | Freq: Every day | ORAL | Status: DC
  Administered 2017-02-05 – 2017-02-07 (×3): 625 mg via ORAL
  Filled 2017-02-05 (×4): qty 1

## 2017-02-05 MED ORDER — PSYLLIUM 95 % PO PACK
1.0000 | PACK | Freq: Every day | ORAL | Status: DC
  Administered 2017-02-05 – 2017-02-06 (×2): 1 via ORAL
  Filled 2017-02-05 (×3): qty 1

## 2017-02-05 MED ORDER — PREDNISONE 10 MG PO TABS
10.0000 mg | ORAL_TABLET | Freq: Every day | ORAL | Status: DC
  Administered 2017-02-05 – 2017-02-08 (×4): 10 mg via ORAL
  Filled 2017-02-05 (×4): qty 1

## 2017-02-05 MED ORDER — SENNA 8.6 MG PO TABS
1.0000 | ORAL_TABLET | Freq: Every evening | ORAL | Status: DC
  Administered 2017-02-05 – 2017-02-07 (×3): 8.6 mg via ORAL
  Filled 2017-02-05 (×7): qty 1

## 2017-02-05 MED ORDER — PREDNISONE 10 MG PO TABS: 10.0000 mg | ORAL_TABLET | Freq: Every day | ORAL | Status: DC

## 2017-02-05 NOTE — Progress Notes (Signed)
Subjective:  He should not complaining of much shortness of breath, he mostly is concerned that he can't seem to cough up his mucus that he feels in his lungs.  He reports the sputum he is able to cough up seems to be clear.  No acute events overnight he continues to deny chest pain today.  He denies vomiting, nausea, diarrhea.  He has not had a bowel movement has noticed no blood in his stools recently or black stools.    Objective:  Vital signs in last 24 hours: Vitals:   02/05/17 0600 02/05/17 0834 02/05/17 0917 02/05/17 0918  BP:      Pulse: 74     Resp: 16     Temp:  98.3 F (36.8 C)    TempSrc:      SpO2: 98%  96% 98%  Weight:      Height:        Physical Exam  Constitutional: He is oriented to person, place, and time. He appears well-developed and well-nourished.  Eyes: Right eye exhibits no discharge. Left eye exhibits no discharge. No scleral icterus.  Neck: JVD (7cm JVD) present.  Cardiovascular: Normal rate, normal heart sounds and intact distal pulses. An irregularly irregular rhythm present. Exam reveals no gallop and no friction rub.  No murmur heard. Pulmonary/Chest: Effort normal. No respiratory distress. He has wheezes (very mild end expiratory wheeze diffuse). He has rhonchi in the right middle field. He has no rales.  Bronchial breath sounds throughout  Abdominal: Soft. Bowel sounds are normal. He exhibits no distension and no mass. There is no tenderness. There is no guarding.  Musculoskeletal:       Right lower leg: He exhibits no edema.       Left lower leg: He exhibits no edema.  Neurological: He is alert and oriented to person, place, and time.  Skin:  Stasis dermatitis on distal bilateral lower extremities      Assessment/Plan:  Principal Problem:   Community acquired pneumonia Active Problems:   Essential hypertension   Permanent atrial fibrillation ([place])  Community aquired pneumonia w/risk factors of MDR: pt with history of pneumonias due to  immunosuppression by azathioprine, prednisone for his treatment of MG.  Found to have new multifocal pneumonia in lower lobes worse in R middle lobe.     -Treated as hospital acquired pneumonia given pts recent hospital stay for knee replacement and his immunosuppresion. -Continue broad spec abx   Permanent Atrial fibrillation: pt was on warfarin but taken off due to consistently being supratherapeutic, he is now on aspirin 81mg  every day plavix 75mg  every other day.   -Unusual anticoagulation regimen, will look into this more closely, continue for now.   -diltiazem gtt, will switch back to metoprolol today since pt more stable   COPD: [facility], well controlled on Dulera, minimal wheezing on exam    -begin with scheduled duoneb therapy every 6 hours transition to PRN with home meds   Myasthenia Gravis: chronic, stable, on azathioprine 50mg  BID,  prednisone 10mg  daily ,    -continue   CHF: no prior ECHO or diagnosis, pt on lasix 40mg  daily, which was attributed to chronic venous stasis.  Likely has some acute on chronic CHF, mild elevation in JVP, BNP elevated, troponins elevated, CT shows diffuse coronary calcification and cardiomegaly, pt has afib  -will get an ECHO to further evaluate   HTN: chronic, stable   -will reintroduce home meds as pt continues to stabilize  Degenerative disc disease: pt with cervical and lumbar disc disease, with prior surgical intervention   -continuing home oxycontin.        Dispo: Anticipated discharge in approximately 3 day(s).   Katherine Roan, MD 02/05/2017, 12:04 PM Vickki Muff MD PGY-1 Internal Medicine Pager # [phone]

## 2017-02-05 NOTE — Care Management Obs Status (Signed)
[place] NOTIFICATION   Patient Details  Name: Travis Ross MRN: [id number] Date of Birth: 1926-04-18   Medicare Observation Status Notification Given:  Yes    Arley Phenix, RN 02/05/2017, 12:02 PM

## 2017-02-05 NOTE — Care Management Note (Signed)
Case Management Note  Patient Details  Name: Travis Ross MRN: [id number] Date of Birth: 31-Aug-1926  Subjective/Objective:         Pt presented for SOB and pneumonia.  Pt from home with daughter where he is non-ambulatory.  Pt has had a hired PCA recently, but currently in the midst of transition and has no one hired at this time.  Pt has a 2nd daughter who is POA.  Pt has a rented hospital bed, W/C, and 3 in 1 at home.           Action/Plan: MOON delivered.  CM will continue to follow for d/c needs.  Expected Discharge Date:                  Expected Discharge Plan:  Home/Self Care  In-House Referral:  NA  Discharge planning Services  CM Consult  Post Acute Care Choice:    Choice offered to:     DME Arranged:    DME Agency:     HH Arranged:    HH Agency:     Status of Service:  In process, will continue to follow  If discussed at Long Length of Stay Meetings, dates discussed:    Additional Comments:  Arley Phenix, RN 02/05/2017, 12:19 PM

## 2017-02-05 NOTE — Evaluation (Signed)
Occupational Therapy Evaluation Patient Details Name: Travis Ross MRN: [id number] DOB: 06-03-1926 Today's Date: 02/05/2017    History of Present Illness 83 year old man with PMH significant for Afib, COPD, HTN, GERD, Myasthenia Gravis, Hypothyroidism, BL total knee arthroplasties with a most recent revision of the R on 10/18. Also known history of C3 cervical cord compression which is causing current BL upper extremity and hand weakness. Pt presents with 3d of worsening short of breath and cough w/ sputum production brought in from [facility] by EMS.    Clinical Impression   PTA, pt living at ALF and was receiving assistance for ADLs and performing lateral scoots with sliding board. Pt currently requiring Max A + 2 for LB ADLs and Mod A +2 with RW for sit<>Stand. Pt presenting with poor activity tolerance as seen by fatigue, decreased in SpO2 to 88% on 3L, and elevated HR to ~110. Pt would benefit form further acute OT to facilitate safe dc. Recommend dc to SNF for further OT to increase safety and independence with ADLs and functional mobility.     Follow Up Recommendations  SNF (Or HHPT at ALF if facility will continue providing assistance with ADLs)   Equipment Recommendations  Other (comment)(Defer to next venue)    Recommendations for Other Services PT consult     Precautions / Restrictions Precautions Precautions: Fall Restrictions Weight Bearing Restrictions: No      Mobility Bed Mobility Overal bed mobility: Needs Assistance Bed Mobility: Supine to Sit;Sit to Supine     Supine to sit: Min assist;HOB elevated;+2 for safety/equipment Sit to supine: Mod assist;+2 for safety/equipment   General bed mobility comments: Pt requiring Min A to pull hips towards EOB by holding into therapit arm. Mod A to manage BLEs to return to supine  Transfers Overall transfer level: Needs assistance Equipment used: Rolling walker (2 wheeled) Transfers: Sit to/from Stand Sit to  Stand: Mod assist;From elevated surface;+2 physical assistance         General transfer comment: Mod A +2 to power into standing from elevated seat. Pt with forward flexion at hips and unabel to assume upright position. Pt fatigues quickly    Balance Overall balance assessment: Needs assistance Sitting-balance support: No upper extremity supported;Feet supported Sitting balance-Leahy Scale: Fair     Standing balance support: Bilateral upper extremity supported;During functional activity Standing balance-Leahy Scale: Poor Standing balance comment: Reliant on UE support and physical A to maintain standing                           ADL either performed or assessed with clinical judgement   ADL Overall ADL's : Needs assistance/impaired Eating/Feeding: Minimal assistance;Sitting(supported sitting) Eating/Feeding Details (indicate cue type and reason): Needs assistance opening containers. Reports he uses adaptive uteniles at home due to poor grasp strength Grooming: Minimal assistance;Sitting   Upper Body Bathing: Moderate assistance;Sitting Upper Body Bathing Details (indicate cue type and reason): supported sitting Lower Body Bathing: Maximal assistance;Sit to/from stand;+2 for physical assistance   Upper Body Dressing : Moderate assistance;Sitting   Lower Body Dressing: Maximal assistance;+2 for physical assistance;Sit to/from stand Lower Body Dressing Details (indicate cue type and reason): max A to don socks             Functional mobility during ADLs: Moderate assistance;+2 for physical assistance;Rolling walker(sit<>Stand only) General ADL Comments: Pt dmeonstrating decreased activity tolerance as seen by decreased SpO2 (88% on 3L) and elevated HR with activity.  Vision         Perception     Praxis      Pertinent Vitals/Pain Pain Assessment: Faces Faces Pain Scale: Hurts little more Pain Intervention(s): Monitored during session;Repositioned      Hand Dominance     Extremity/Trunk Assessment Upper Extremity Assessment Upper Extremity Assessment: Generalized weakness(Poor grasp and pinch strength)   Lower Extremity Assessment Lower Extremity Assessment: Generalized weakness   Cervical / Trunk Assessment Cervical / Trunk Assessment: Kyphotic;Other exceptions Cervical / Trunk Exceptions: Lumbar fusion   Communication Communication Communication: No difficulties   Cognition Arousal/Alertness: Awake/alert Behavior During Therapy: WFL for tasks assessed/performed Overall Cognitive Status: History of cognitive impairments - at baseline                                     General Comments      Exercises     Shoulder Instructions      Home Living Family/patient expects to be discharged to:: Skilled nursing facility                                 Additional Comments: from [facility]      Prior Functioning/Environment Level of Independence: Needs assistance  Gait / Transfers Assistance Needed: "I creep around." later pt rpoerts he uses a sliding board ADL's / Homemaking Assistance Needed: Assist with bathing and dressing            OT Problem List: Decreased strength;Decreased range of motion;Decreased activity tolerance;Impaired balance (sitting and/or standing);Decreased safety awareness;Decreased knowledge of use of DME or AE;Decreased coordination;Impaired UE functional use;Pain      OT Treatment/Interventions: Self-care/ADL training;Therapeutic exercise;Energy conservation;DME and/or AE instruction;Therapeutic activities;Patient/family education    OT Goals(Current goals can be found in the care plan section) Acute Rehab OT Goals Patient Stated Goal: Improve breathing OT Goal Formulation: With patient Time For Goal Achievement: 02/19/17 Potential to Achieve Goals: Good  OT Frequency: Min 2X/week   Barriers to D/C:            Co-evaluation PT/OT/SLP  Co-Evaluation/Treatment: Yes Reason for Co-Treatment: For patient/therapist safety;To address functional/ADL transfers PT goals addressed during session: Mobility/safety with mobility OT goals addressed during session: ADL's and self-care      AM-PAC PT "6 Clicks" Daily Activity     Outcome Measure Help from another person eating meals?: A Little Help from another person taking care of personal grooming?: A Little Help from another person toileting, which includes using toliet, bedpan, or urinal?: A Lot Help from another person bathing (including washing, rinsing, drying)?: A Lot Help from another person to put on and taking off regular upper body clothing?: A Lot Help from another person to put on and taking off regular lower body clothing?: A Lot 6 Click Score: 14   End of Session Equipment Utilized During Treatment: Gait belt;Rolling walker;Oxygen Nurse Communication: Mobility status;Other (comment)(bed positioned in chair position)  Activity Tolerance: Patient limited by fatigue Patient left: in bed;with call bell/phone within reach;with bed alarm set  OT Visit Diagnosis: Unsteadiness on feet (R26.81);Other abnormalities of gait and mobility (R26.89);Muscle weakness (generalized) (M62.81);Pain                Time: 7829-5621 OT Time Calculation (min): 28 min Charges:  OT General Charges $OT Visit: 1 Visit OT Evaluation $OT Eval Moderate Complexity: 1 Mod G-Codes: OT G-codes **  NOT FOR INPATIENT CLASS** Functional Assessment Tool Used: Clinical judgement Functional Limitation: Self care Self Care Current Status (A7014): At least 60 percent but less than 80 percent impaired, limited or restricted Self Care Goal Status (D0301): At least 40 percent but less than 60 percent impaired, limited or restricted   [place], OTR/L Acute Rehab Pager: [phone] Office: [place] 02/05/2017, 11:37 AM

## 2017-02-05 NOTE — Evaluation (Signed)
Physical Therapy Evaluation Patient Details Name: Travis Ross MRN: [id number] DOB: 02/26/1927 Today's Date: 02/05/2017   History of Present Illness  83 year old man with PMH significant for Afib, COPD, HTN, GERD, Myasthenia Gravis, Hypothyroidism, BL total knee arthroplasties with a most recent revision of the R on 10/18. Also known history of C3 cervical cord compression which is causing current BL upper extremity and hand weakness. Pt presents with 3d of worsening short of breath and cough w/ sputum production brought in from his [facility] by EMS.     Clinical Impression  Pt admitted with above diagnosis. Pt currently with functional limitations due to the deficits listed below (see PT Problem List). Patient with multiple issues PTA effecting his mobility. Anticipate he is near his baseline, however chair in his room not conducive to attempt sliding board transfer. If ALF thinks he is close to his baseline, he could return with HHPT. Pt will benefit from skilled PT to increase their independence and safety with mobility to allow discharge to the venue listed below.       Follow Up Recommendations SNF(or HHPT at ALF if center feels pt close to his baseline)    Equipment Recommendations  None recommended by PT (he has everything at ALF already)   Recommendations for Other Services       Precautions / Restrictions Precautions Precautions: Fall Restrictions Weight Bearing Restrictions: No      Mobility  Bed Mobility Overal bed mobility: Needs Assistance Bed Mobility: Supine to Sit;Sit to Supine     Supine to sit: Min assist;HOB elevated;+2 for safety/equipment Sit to supine: Mod assist;+2 for safety/equipment   General bed mobility comments: Pt requiring Min A to pull hips towards EOB by holding into therapit arm. Mod A to manage BLEs to return to supine  Transfers Overall transfer level: Needs assistance Equipment used: Rolling walker (2 wheeled) Transfers: Sit  to/from Stand Sit to Stand: Mod assist;From elevated surface;+2 physical assistance         General transfer comment: Mod A +2 to power into standing from elevated EOB with pt bracing back of both legs against bedframe and then hyperextending knees as he achieves extension; strong posterior lean  Ambulation/Gait             General Gait Details: unable  Stairs            Wheelchair Mobility    Modified Rankin (Stroke Patients Only)       Balance Overall balance assessment: Needs assistance Sitting-balance support: No upper extremity supported;Feet supported Sitting balance-Leahy Scale: Fair     Standing balance support: Bilateral upper extremity supported;During functional activity Standing balance-Leahy Scale: Zero Standing balance comment: Reliant on UE support, legs against bedframe, and physical A to maintain standing                             Pertinent Vitals/Pain Pain Assessment: Faces Faces Pain Scale: Hurts little more Pain Intervention(s): Monitored during session;Limited activity within patient's tolerance    Home Living Family/patient expects to be discharged to:: Skilled nursing facility                 Additional Comments: from [facility]    Prior Function Level of Independence: Needs assistance   Gait / Transfers Assistance Needed: uses legs to move in w/c; sliding board transfers with assist  ADL's / Homemaking Assistance Needed: Assist with bathing and dressing  Hand Dominance        Extremity/Trunk Assessment   Upper Extremity Assessment Upper Extremity Assessment: Defer to OT evaluation    Lower Extremity Assessment Lower Extremity Assessment: RLE deficits/detail;LLE deficits/detail RLE Deficits / Details: hip flexion 3, knee extension 3+; braces back of legs against bedframe to assist with standing and then hyperextends his knees to maintain standing LLE Deficits / Details: hip flex 3,  knee extension 3+    Cervical / Trunk Assessment Cervical / Trunk Assessment: Kyphotic;Other exceptions Cervical / Trunk Exceptions: Lumbar fusion  Communication   Communication: No difficulties  Cognition Arousal/Alertness: Awake/alert Behavior During Therapy: WFL for tasks assessed/performed Overall Cognitive Status: History of cognitive impairments - at baseline                                        General Comments     Exercises     Assessment/Plan    PT Assessment Patient needs continued PT services  PT Problem List Decreased strength;Decreased activity tolerance;Decreased balance;Decreased mobility;Decreased knowledge of use of DME;Decreased safety awareness;Decreased knowledge of precautions;Pain       PT Treatment Interventions DME instruction;Functional mobility training;Therapeutic activities;Therapeutic exercise;Neuromuscular re-education;Patient/family education;Wheelchair mobility training    PT Goals (Current goals can be found in the Care Plan section)  Acute Rehab PT Goals Patient Stated Goal: Improve breathing PT Goal Formulation: With patient Time For Goal Achievement: 02/19/17 Potential to Achieve Goals: Fair    Frequency Min 2X/week   Barriers to discharge Decreased caregiver support came from ALF level of care; ?can they continue to manage pt?    Co-evaluation PT/OT/SLP Co-Evaluation/Treatment: Yes Reason for Co-Treatment: For patient/therapist safety;Complexity of the patient's impairments (multi-system involvement) PT goals addressed during session: Mobility/safety with mobility;Balance;Proper use of DME OT goals addressed during session: ADL's and self-care       AM-PAC PT "6 Clicks" Daily Activity  Outcome Measure Difficulty turning over in bed (including adjusting bedclothes, sheets and blankets)?: A Little Difficulty moving from lying on back to sitting on the side of the bed? : A Little Difficulty sitting down on and  standing up from a chair with arms (e.g., wheelchair, bedside commode, etc,.)?: A Lot Help needed moving to and from a bed to chair (including a wheelchair)?: Total Help needed walking in hospital room?: Total Help needed climbing 3-5 steps with a railing? : Total 6 Click Score: 11    End of Session Equipment Utilized During Treatment: Gait belt Activity Tolerance: Patient limited by fatigue Patient left: in bed;with call bell/phone within reach(in chair position) Nurse Communication: Mobility status;Need for lift equipment PT Visit Diagnosis: Muscle weakness (generalized) (M62.81)    Time: 2778-2423 PT Time Calculation (min) (ACUTE ONLY): 28 min   Charges:   PT Evaluation $PT Eval Moderate Complexity: 1 Mod     PT G Codes:   PT G-Codes **NOT FOR INPATIENT CLASS** Functional Assessment Tool Used: AM-PAC 6 Clicks Basic Mobility Functional Limitation: Mobility: Walking and moving around Mobility: Walking and Moving Around Current Status (N3614): At least 60 percent but less than 80 percent impaired, limited or restricted Mobility: Walking and Moving Around Goal Status [phone]): At least 60 percent but less than 80 percent impaired, limited or restricted      [employer], PT 02/05/2017, 1:27 PM

## 2017-02-06 ENCOUNTER — Inpatient Hospital Stay (HOSPITAL_COMMUNITY): Payer: Medicare HMO

## 2017-02-06 DIAGNOSIS — L89153 Pressure ulcer of sacral region, stage 3: Secondary | ICD-10-CM

## 2017-02-06 DIAGNOSIS — I5020 Unspecified systolic (congestive) heart failure: Secondary | ICD-10-CM

## 2017-02-06 DIAGNOSIS — I5023 Acute on chronic systolic (congestive) heart failure: Secondary | ICD-10-CM | POA: Insufficient documentation

## 2017-02-06 DIAGNOSIS — I4891 Unspecified atrial fibrillation: Secondary | ICD-10-CM

## 2017-02-06 DIAGNOSIS — Z79891 Long term (current) use of opiate analgesic: Secondary | ICD-10-CM

## 2017-02-06 DIAGNOSIS — L89152 Pressure ulcer of sacral region, stage 2: Secondary | ICD-10-CM | POA: Diagnosis present

## 2017-02-06 LAB — BASIC METABOLIC PANEL
Anion gap: 9 (ref 5–15)
BUN: 33 mg/dL — AB (ref 6–20)
CALCIUM: 8.4 mg/dL — AB (ref 8.9–10.3)
CO2: 24 mmol/L (ref 22–32)
CREATININE: 0.89 mg/dL (ref 0.61–1.24)
Chloride: 100 mmol/L — ABNORMAL LOW (ref 101–111)
GFR calc non Af Amer: >60 mL/min (ref >60)
Glucose, Bld: 115 mg/dL — ABNORMAL HIGH (ref 65–99)
Potassium: 3.8 mmol/L (ref 3.5–5.1)
SODIUM: 133 mmol/L — AB (ref 135–145)

## 2017-02-06 LAB — CBC
HCT: 30.1 % — ABNORMAL LOW (ref 39.0–52.0)
Hemoglobin: 9.3 g/dL — ABNORMAL LOW (ref 13.0–17.0)
MCH: 28.4 pg (ref 26.0–34.0)
MCHC: 30.9 g/dL (ref 30.0–36.0)
MCV: 91.8 fL (ref 78.0–100.0)
PLATELETS: 155 10*3/uL (ref 150–400)
RBC: 3.28 MIL/uL — ABNORMAL LOW (ref 4.22–5.81)
RDW: 15.8 % — AB (ref 11.5–15.5)
WBC: 9.4 10*3/uL (ref 4.0–10.5)

## 2017-02-06 LAB — ECHOCARDIOGRAM COMPLETE
AOASC: 26 cm
CHL CUP PV REG GRAD DIAS: 7 mmHg
FS: 32 % (ref 28–44)
HEIGHTINCHES: 73 in
IVS/LV PW RATIO, ED: 0.92
LA diam end sys: 52 mm
LA diam index: 2.33 cm/m2
LASIZE: 52 mm
LAVOL: 149 mL
LAVOLA4C: 143 mL
LAVOLIN: 66.7 mL/m2
LDCA: 3.8 cm2
LV PW d: 12 mm — AB (ref 0.6–1.1)
LVOTD: 22 mm
PV Reg vel dias: 132 cm/s
RV sys press: 47 mmHg
Reg peak vel: 281 cm/s
TAPSE: 9.66 mm
TR max vel: 281 cm/s
WEIGHTICAEL: 3372.16 [oz_av]

## 2017-02-06 MED ORDER — FUROSEMIDE 40 MG PO TABS: 40.0000 mg | ORAL_TABLET | Freq: Every day | ORAL | Status: DC

## 2017-02-06 MED ORDER — FUROSEMIDE 40 MG PO TABS
40.0000 mg | ORAL_TABLET | Freq: Every day | ORAL | Status: DC
  Administered 2017-02-07 – 2017-02-08 (×2): 40 mg via ORAL
  Filled 2017-02-06 (×2): qty 1

## 2017-02-06 MED ORDER — FUROSEMIDE 10 MG/ML IJ SOLN
40.0000 mg | Freq: Once | INTRAMUSCULAR | Status: AC
  Administered 2017-02-06: 40 mg via INTRAVENOUS
  Filled 2017-02-06: qty 4

## 2017-02-06 NOTE — Progress Notes (Signed)
Internal Medicine Attending:   I saw and examined the patient. I reviewed the resident's note and I agree with the resident's findings and plan as documented in the resident's note. Overall feeling better.  On exam has elevated JVD, as well as ronchi and rales at lung bases.   Agree with Dr Maudie Flakes plan, continue with Cefepime and azithromycin for CAP. Echo completed today he has acute on chronic systolic CHF, still volume overloaded, will continue to diuresis. Was noted to have pressure ulcer on admission Stage 2, on our evaluation today we are concerned this is a stage 3 ulcer will consult wound care nurse.

## 2017-02-06 NOTE — Progress Notes (Signed)
  Echocardiogram 2D Echocardiogram has been performed.  Travis Ross G Dustin Bumbaugh 02/06/2017, 10:02 AM

## 2017-02-06 NOTE — Progress Notes (Signed)
Subjective:  Pt denies feeling short of breath, denies chest pain.  Unable to cough up much sputum. Does feel overall better.  He denies vomiting, nausea, diarrhea.  Has not had a bowel movement but is not uncomfortable and feels like his bowel regimen is helping.    Objective:  Vital signs in last 24 hours: Vitals:   02/06/17 1116 02/06/17 1213 02/06/17 1214 02/06/17 1224  BP:    119/81  Pulse:    96  Resp:    20  Temp: 97.6 F (36.4 C)   97.6 F (36.4 C)  TempSrc:    Oral  SpO2:  99% 92% 100%  Weight:      Height:        Physical Exam  Constitutional: He is oriented to person, place, and time. He appears well-developed and well-nourished.  Eyes: Right eye exhibits no discharge. Left eye exhibits no discharge. No scleral icterus.  Neck: JVD (11cm JVD) present.  Cardiovascular: Normal rate, normal heart sounds and intact distal pulses. An irregularly irregular rhythm present. Exam reveals no gallop and no friction rub.  No murmur heard. Pulmonary/Chest: Effort normal. No respiratory distress. He has wheezes (very mild end expiratory wheeze diffuse). He has rhonchi in the right middle field. He has no rales.  Bronchial breath sounds throughout  Abdominal: Soft. Bowel sounds are normal. He exhibits no distension and no mass. There is no tenderness. There is no guarding.  Musculoskeletal:       Right lower leg: He exhibits no edema.       Left lower leg: He exhibits no edema.  Neurological: He is alert and oriented to person, place, and time.  Skin:  Stage 3 sacral pressure ulcer.  1cm diameter erythematous and tender. No purulent material seen or discharge   Stasis dermatitis on distal bilateral lower extremities      Assessment/Plan:  Principal Problem:   Community acquired pneumonia Active Problems:   Essential hypertension   Permanent atrial fibrillation (HCC)   Pressure injury of sacral region, stage 2   Acute on chronic systolic (congestive) heart failure  ([place])  Community aquired pneumonia w/risk factors of MDR: pt with history of pneumonias due to immunosuppression by azathioprine, prednisone for his treatment of MG.  Found to have new multifocal pneumonia in lower lobes worse in R middle lobe.     -WBC count continuing to trend down, remains afebrile -Treating with broad spec abx given pts recent hospital stay for knee replacement and his immunosuppresion. -Continue broad spec abx   Permanent Atrial fibrillation: pt was on warfarin but taken off due to consistently being supratherapeutic, he is now on aspirin 81mg  every day plavix 75mg  every other day.   -Unusual anticoagulation regimen, will look into this more closely, continue for now.   -diltiazem gtt, switched back to home metoprolol with good result   COPD: [facility], well controlled on Dulera, minimal wheezing on exam    -begin with scheduled duoneb therapy every 6 hours transition to PRN with home meds   Myasthenia Gravis: chronic, stable, on azathioprine 50mg  BID,  prednisone 10mg  daily ,    -continue   HFrEF: no prior ECHO or diagnosis, pt on lasix 40mg  daily, which was attributed to chronic venous stasis but had high suspicion of HFrEF.  ECHO 02/06/17 shows EF 30-35% diffuse hypokinesis, mod TR, could not evaluate diastolic dysfunction due to afib.    -volume up today, will give lasix 40 IV, restart home lasix  tomorrow -on BB, will introduce other therapy as bp tolerates  HTN: chronic, stable   -will reintroduce home meds as pt continues to stabilize   Degenerative disc disease: pt with cervical and lumbar disc disease, with prior surgical intervention   -continuing home oxycontin.     Sacral Pressure ulcer stage 3:  Pt presents with chronic sacral pressure ulcer, 1cm diameter erythematous and tender. No purulent material seen or discharge    -clean, dry, has dressing applied -continue wound care   Dispo: Anticipated discharge to SNF in  approximately 1-2 day(s).   Katherine Roan, MD 02/06/2017, 1:24 PM Vickki Muff MD PGY-1 Internal Medicine Pager # [phone]

## 2017-02-06 NOTE — Discharge Summary (Cosign Needed)
Name: Travis Ross MRN: [id number] DOB: 1926-03-12 83 y.o. PCP: [name], MD  Date of Admission: 02/04/2017  1:09 AM Dictation #1 [id number]:[id number]  [id number]:[id number]  Date of Discharge: 02/07/2017 Attending Physician: Lucious Groves, DO  Discharge Diagnosis: 1.  Principal Problem:   Community acquired pneumonia Active Problems:   Essential hypertension   Permanent atrial fibrillation (HCC)   Pressure injury of sacral region, stage 2   Acute on chronic systolic (congestive) heart failure ([place])   Discharge Medications: Allergies as of 02/07/2017   No Known Allergies     Medication List    STOP taking these medications   levofloxacin 500 MG tablet Commonly known as:  LEVAQUIN   methocarbamol 500 MG tablet Commonly known as:  ROBAXIN   traMADol 50 MG tablet Commonly known as:  ULTRAM     TAKE these medications   acetaminophen 325 MG tablet Commonly known as:  TYLENOL Take 650 mg by mouth every 6 (six) hours as needed for moderate pain.   aspirin EC 81 MG tablet Take 81 mg by mouth daily.   atorvastatin 10 MG tablet Commonly known as:  LIPITOR Take 10 mg by mouth every evening.   azaTHIOprine 50 MG tablet Commonly known as:  IMURAN Take 50 mg by mouth 2 (two) times daily.   benzonatate 100 MG capsule Commonly known as:  TESSALON Take 100 mg by mouth at bedtime.   cefdinir 300 MG capsule Commonly known as:  OMNICEF Take 1 capsule (300 mg total) by mouth 2 (two) times daily.   clopidogrel 75 MG tablet Commonly known as:  PLAVIX Take 75 mg by mouth every other day.   COMBIVENT RESPIMAT 20-100 MCG/ACT Aers respimat Generic drug:  Ipratropium-Albuterol Inhale 1 puff into the lungs 3 (three) times daily.   DAILY VITAMIN PO Take 1 tablet by mouth daily.   docusate sodium 100 MG capsule Commonly known as:  COLACE Take 100 mg by mouth 2 (two) times daily.   DULERA 100-5 MCG/ACT Aero Generic drug:  mometasone-formoterol Take 2 puffs by mouth 2 (two)  times daily.   Fiber 0.52 g Caps Take 0.52 g by mouth at bedtime.   fluticasone 50 MCG/ACT nasal spray Commonly known as:  FLONASE Place 1 spray into both nostrils daily.   furosemide 40 MG tablet Commonly known as:  LASIX Take 40 mg by mouth daily.   guaiFENesin 600 MG 12 hr tablet Commonly known as:  MUCINEX Take 600 mg by mouth 2 (two) times daily.   levothyroxine 175 MCG tablet Commonly known as:  SYNTHROID, LEVOTHROID Take 175 mcg by mouth daily.   losartan 50 MG tablet Commonly known as:  COZAAR Take 0.5 tablets (25 mg total) by mouth daily. What changed:  how much to take   magnesium hydroxide 400 MG/5ML suspension Commonly known as:  MILK OF MAGNESIA Take 30 mLs by mouth See admin instructions. Take 30 ml by mouth at bedtime every Monday, Wednesday and Friday. Take 30 ml by mouth every 24 hours as needed for constipation.   metoprolol succinate 50 MG 24 hr tablet Commonly known as:  TOPROL-XL Take 50 mg by mouth daily.   neomycin-bacitracin-polymyxin ointment Commonly known as:  NEOSPORIN Apply 1 application topically daily. apply to eye   oxyCODONE 30 MG 12 hr tablet Commonly known as:  OXYCONTIN Take 1 tablet (30 mg total) by mouth every 12 (twelve) hours. What changed:  Another medication with the same name was removed. Continue taking this medication, and follow the directions  you see here.   pantoprazole 40 MG tablet Commonly known as:  PROTONIX Take 40 mg by mouth 2 (two) times daily.   polycarbophil 625 MG tablet Commonly known as:  FIBERCON Take 1 tablet (625 mg total) by mouth at bedtime.   polyethylene glycol packet Commonly known as:  MIRALAX / GLYCOLAX Take 17 g by mouth daily.   potassium chloride SA 20 MEQ tablet Commonly known as:  K-DUR,KLOR-CON Take 20 mEq by mouth 3 (three) times a week. Monday, Wednesday and Friday   predniSONE 10 MG tablet Commonly known as:  DELTASONE Take 1 tablet (10 mg total) by mouth daily with  breakfast. What changed:    how much to take  Another medication with the same name was removed. Continue taking this medication, and follow the directions you see here.   senna 8.6 MG tablet Commonly known as:  SENOKOT Take 1 tablet by mouth every evening.   sertraline 100 MG tablet Commonly known as:  ZOLOFT Take 150 mg by mouth daily.   SIMBRINZA 1-0.2 % Susp Generic drug:  Brinzolamide-Brimonidine Place 1 drop into both eyes 2 (two) times daily.   temazepam 30 MG capsule Commonly known as:  RESTORIL Take 30 mg by mouth at bedtime.   TRAVATAN Z 0.004 % Soln ophthalmic solution Generic drug:  Travoprost (BAK Free) Place 1 drop into both eyes at bedtime.       Disposition and follow-up:   Mr.Travis Ross was discharged from [facility] in Stable condition.  At the hospital follow up visit please address:  1.   Community acquired Pneumonia -continue to monitor pt for signs of persistent pneumonia -ensure pt received full course of 7 days of oral cefdinir   Acute on chronic HFrEF -monitor volume status, ensure pt taking lasix daily   Pressure ulcer stage 3 -continue wound care   2.  Labs / imaging needed at time of follow-up: none  3.  Pending labs/ test needing follow-up: none  Follow-up Appointments: Follow-up Information    Leonie Man, MD Follow up in 1 month(s).   Specialty:  Cardiology Why:  follow up with your cardiologist in one month Contact information: [place] [address] [phone]        [name], MD Follow up in 1 week(s).   Specialty:  Family Medicine Why:  follow up with your pcp in one week Contact information: [address] [place] [address] [phone]           Hospital Course by problem list: Principal Problem:   Community acquired pneumonia Active Problems:   Essential hypertension   Permanent atrial fibrillation (HCC)   Pressure  injury of sacral region, stage 2   Acute on chronic systolic (congestive) heart failure ([place])   1.   Community acquired Pneumonia Patient transported here for shortness of breath from his assisted living [facility].  He was given a CT angiogram to rule out PE and on the imaging that showed a multifocal pneumonia the lower lobes and greater on the right.  This correlated with his chest x-ray as well.  He had no PE.  Initially presented to [name] with atrial fibrillation with RVR, he was hemodynamically stable although he did have an elevated white blood cell count and slightly elevated lactic acid..  We started a diltiazem drip, and began to treat his pneumonia with broad-spectrum antibiotics given his immunosuppression and recent hospitalization.  He was treated successfully and  slowly narrowed his antibiotics.  He was discharged in good stable condition still having some congestion in his lungs but better since he initially was admitted.  His white count came back to normal.  He remained afebrile throughout the admission and was not short of breath.  We're able to discharge him with Cefdinir a 7 day course.     Acute on chronic HFrEF Patient did not have a formal diagnosis of heart failure although as expected given his imaging results showing atherosclerosis, given his A. fib, and given his Lasix dose which were initially told us for venous stasis.  I measured needs had heart failure for some time now we get a formal echo that showed heart failure with reduced ejection fraction EF of 35-40%.  We diurese the patient to euvolemia.  We discharged him on his home dose of Lasix 40 mg.   Pressure ulcer stage 3 We noticed the patient had a pressure ulcer when he was admitted, we gave the ulcer appropriate wound care and bacterial ointment.  The area clean and dry and bandaged.   Discharge Vitals:   BP (!) 136/109 (BP Location: Left Leg)   Pulse 94   Temp 99.3 F (37.4 C) (Oral)   Resp (!) 24   Ht  6\' 1"  (1.854 m)   Wt 208 lb 1.8 oz (94.4 kg)   SpO2 95%   BMI 27.46 kg/m   Pertinent Labs, Studies, and Procedures:  BNP (last 3 results) Recent Labs    02/04/17 0150  BNP 397.0*   BMP Latest Ref Rng & Units 02/07/2017 02/06/2017 02/05/2017  Glucose 65 - 99 mg/dL 94 115(H) 133(H)  BUN 6 - 20 mg/dL 29(H) 33(H) 34(H)  Creatinine 0.61 - 1.24 mg/dL 0.93 0.89 1.02  Sodium 135 - 145 mmol/L 135 133(L) 136  Potassium 3.5 - 5.1 mmol/L 3.4(L) 3.8 4.1  Chloride 101 - 111 mmol/L 101 100(L) 102  CO2 22 - 32 mmol/L 25 24 25   Calcium 8.9 - 10.3 mg/dL 8.4(L) 8.4(L) 8.5(L)   CBC Latest Ref Rng & Units 02/07/2017 02/06/2017 02/05/2017  WBC 4.0 - 10.5 K/uL 7.6 9.4 10.0  Hemoglobin 13.0 - 17.0 g/dL 9.1(L) 9.3(L) 9.0(L)  Hematocrit 39.0 - 52.0 % 29.9(L) 30.1(L) 29.1(L)  Platelets 150 - 400 K/uL 163 155 157    ECHO Study Conclusions   - Left ventricle: The cavity size was normal. Wall thickness was   increased in a pattern of mild LVH. Systolic function was   moderately to severely reduced. The estimated ejection fraction   was in the range of 30% to 35%. Diffuse hypokinesis. - Aortic valve: Transvalvular velocity was within the normal range.   There was no stenosis. There was trivial regurgitation. - Mitral valve: Transvalvular velocity was within the normal range.   There was no evidence for stenosis. There was mild regurgitation. - Left atrium: The atrium was severely dilated. - Right ventricle: The cavity size was normal. Wall thickness was   normal. Systolic function was mildly reduced. - Right atrium: The atrium was moderately dilated. - Atrial septum: No defect or patent foramen ovale was identified. - Tricuspid valve: There was moderate regurgitation. - Pulmonary arteries: Systolic pressure was mildly increased. PA   peak pressure: 47 mm Hg (S).  CLINICAL DATA:  83 y/o M; two days of shortness of breath with cough.   EXAM: CT ANGIOGRAPHY CHEST WITH CONTRAST    TECHNIQUE: Multidetector CT imaging of the chest was performed using the standard protocol during bolus administration  of intravenous contrast. Multiplanar CT image reconstructions and MIPs were obtained to evaluate the vascular anatomy.   CONTRAST:  169mL ISOVUE-370 IOPAMIDOL (ISOVUE-370) INJECTION 76%   COMPARISON:  None.   FINDINGS: Cardiovascular: Moderate to severe cardiomegaly. Moderate coronary artery calcification. Normal caliber thoracic aorta with moderate calcific atherosclerosis. Satisfactory opacification of the pulmonary arteries. No pulmonary embolus.   Mediastinum/Nodes: No enlarged mediastinal, hilar, or axillary lymph nodes. Thyroid gland, trachea, and esophagus demonstrate no significant findings.   Lungs/Pleura: Clustered nodules, ground-glass opacities, and dense consolidations present throughout along with greatest in the lower lobes bilaterally in the right middle lobe compatible with multifocal diffuse peribronchial thickening and scattered mucous plugging. Pneumonia.   Upper Abdomen: No acute abnormality.   Musculoskeletal: Calcifications within the bilateral glenohumeral joint spaces may represent chronic synovitis or crystal deposition disease. T4-5 endplate eburnation, vacuum phenomenon, without paravertebral edema, likely representing sequelae of chronic erosive osteoarthritis or spondyloarthropathy. Multilevel thoracic spine syndesmophytes.   Review of the MIP images confirms the above findings.   IMPRESSION: 1. No pulmonary embolus identified. 2. Multifocal pneumonia greatest in lower lobes and right middle lobe. 3. Moderate to severe cardiomegaly. 4. Moderate coronary artery and aortic calcific atherosclerosis.     ProBNP (last 3 results) No results for input(s): PROBNP in the last 8760 hours.  Discharge Instructions: Discharge Instructions    Diet - low sodium heart healthy   Complete by:  As directed    Discharge instructions    Complete by:  As directed    He will be very important that Mr. Compston finish his course of antibiotics.  He will need to follow-up with his primary care doctor about his new heart failure diagnosis and to check on the status of his pneumonia to make sure it has resolved.  His PCP may wish to schedule an appointment with the patient to see his cardiologist Dr. Ellyn Hack.  I have listed the contact information for Dr. Ellyn Hack within this discharge summary.  Additionally would be a good idea to reconnect with the patient with his neurologist in [place].   Increase activity slowly   Complete by:  As directed       Signed: Katherine Roan, MD 02/07/2017, 3:48 PM

## 2017-02-07 ENCOUNTER — Other Ambulatory Visit: Payer: Self-pay

## 2017-02-07 ENCOUNTER — Ambulatory Visit: Payer: Medicare HMO | Admitting: Cardiology

## 2017-02-07 ENCOUNTER — Encounter (HOSPITAL_COMMUNITY): Payer: Self-pay | Admitting: General Practice

## 2017-02-07 LAB — CBC
HCT: 29.9 % — ABNORMAL LOW (ref 39.0–52.0)
HEMOGLOBIN: 9.1 g/dL — AB (ref 13.0–17.0)
MCH: 28.4 pg (ref 26.0–34.0)
MCHC: 30.4 g/dL (ref 30.0–36.0)
MCV: 93.4 fL (ref 78.0–100.0)
PLATELETS: 163 10*3/uL (ref 150–400)
RBC: 3.2 MIL/uL — ABNORMAL LOW (ref 4.22–5.81)
RDW: 16.1 % — ABNORMAL HIGH (ref 11.5–15.5)
WBC: 7.6 10*3/uL (ref 4.0–10.5)

## 2017-02-07 LAB — BASIC METABOLIC PANEL
Anion gap: 9 (ref 5–15)
BUN: 29 mg/dL — ABNORMAL HIGH (ref 6–20)
CALCIUM: 8.4 mg/dL — AB (ref 8.9–10.3)
CHLORIDE: 101 mmol/L (ref 101–111)
CO2: 25 mmol/L (ref 22–32)
CREATININE: 0.93 mg/dL (ref 0.61–1.24)
GFR calc Af Amer: >60 mL/min (ref >60)
GFR calc non Af Amer: >60 mL/min (ref >60)
GLUCOSE: 94 mg/dL (ref 65–99)
Potassium: 3.4 mmol/L — ABNORMAL LOW (ref 3.5–5.1)
Sodium: 135 mmol/L (ref 135–145)

## 2017-02-07 MED ORDER — POTASSIUM CHLORIDE CRYS ER 20 MEQ PO TBCR
40.0000 meq | EXTENDED_RELEASE_TABLET | Freq: Two times a day (BID) | ORAL | Status: AC
  Administered 2017-02-07 (×2): 40 meq via ORAL
  Filled 2017-02-07 (×2): qty 2

## 2017-02-07 MED ORDER — LOSARTAN POTASSIUM 50 MG PO TABS
25.0000 mg | ORAL_TABLET | Freq: Every day | ORAL | Status: DC
Start: 2017-02-07 — End: 2017-02-08

## 2017-02-07 MED ORDER — GUAIFENESIN ER 600 MG PO TB12
600.0000 mg | ORAL_TABLET | Freq: Two times a day (BID) | ORAL | Status: DC
  Administered 2017-02-07 – 2017-02-08 (×3): 600 mg via ORAL
  Filled 2017-02-07 (×3): qty 1

## 2017-02-07 MED ORDER — PREDNISONE 10 MG PO TABS: 10.0000 mg | ORAL_TABLET | Freq: Every day | ORAL | 0 refills | Status: DC

## 2017-02-07 MED ORDER — CEFDINIR 300 MG PO CAPS: 300.0000 mg | ORAL_CAPSULE | Freq: Two times a day (BID) | ORAL | 0 refills | Status: DC

## 2017-02-07 NOTE — Progress Notes (Signed)
Internal Medicine Attending:   I saw and examined the patient. I reviewed the resident's note and I agree with the resident's findings and plan as documented in the resident's note. Patient doing well, liberalize his diet per his wishes. Will plan for 7 day total course of abx, can discharge on cefpodoxime when SNF bed available.

## 2017-02-07 NOTE — Progress Notes (Signed)
Occupational Therapy Treatment Patient Details Name: Travis Ross MRN: [id number] DOB: [birth year] Today's Date: 02/07/2017    History of present illness 83 year old man with PMH significant for Afib, COPD, HTN, GERD, Myasthenia Gravis, Hypothyroidism, BL total knee arthroplasties with a most recent revision of the R on 10/18. Also known history of C3 cervical cord compression which is causing current BL upper extremity and hand weakness. Pt presents with 3d of worsening short of breath and cough w/ sputum production brought [facility].    OT comments  Focus of session on seated self feeding and grooming. Issued AE with pt able to self feed with set up.   Follow Up Recommendations  SNF    Equipment Recommendations       Recommendations for Other Services      Precautions / Restrictions Precautions Precautions: Fall       Mobility Bed Mobility                  Transfers                      Balance                                           ADL either performed or assessed with clinical judgement   ADL Overall ADL's : Needs assistance/impaired Eating/Feeding: Minimal assistance;Sitting Eating/Feeding Details (indicate cue type and reason): issued adaptive utensils and mug for use while here Grooming: Wash/dry hands;Sitting;Minimal assistance;Brushing hair;Moderate assistance                                 General ADL Comments: Pt seated in chair via lift pad upon arrival.      Vision       Perception     Praxis      Cognition Arousal/Alertness: Awake/alert Behavior During Therapy: WFL for tasks assessed/performed Overall Cognitive Status: Within Functional Limits for tasks assessed                                          Exercises     Shoulder Instructions       General Comments      Pertinent Vitals/ Pain       Pain Assessment: No/denies pain  Home Living                                           Prior Functioning/Environment              Frequency  Min 2X/week        Progress Toward Goals  OT Goals(current goals can now be found in the care plan section)  Progress towards OT goals: Progressing toward goals  Acute Rehab OT Goals Patient Stated Goal: Improve breathing OT Goal Formulation: With patient Time For Goal Achievement: 02/19/17 Potential to Achieve Goals: Good  Plan Discharge plan remains appropriate    Co-evaluation                 AM-PAC PT "6 Clicks" Daily Activity     Outcome  Measure   Help from another person eating meals?: A Little Help from another person taking care of personal grooming?: A Lot Help from another person toileting, which includes using toliet, bedpan, or urinal?: A Lot Help from another person bathing (including washing, rinsing, drying)?: A Lot Help from another person to put on and taking off regular upper body clothing?: A Lot Help from another person to put on and taking off regular lower body clothing?: A Lot 6 Click Score: 13    End of Session    OT Visit Diagnosis: Unsteadiness on feet (R26.81);Other abnormalities of gait and mobility (R26.89);Muscle weakness (generalized) (M62.81)   Activity Tolerance Patient tolerated treatment well   Patient Left in chair;with call bell/phone within reach   Nurse Communication          Time: 3536-1443 OT Time Calculation (min): 25 min  Charges: OT General Charges $OT Visit: 1 Visit OT Treatments $Self Care/Home Management : 23-37 mins  02/07/2017 Nestor Lewandowsky, OTR/L Pager: [phone]   [name], [name] 02/07/2017, 1:20 PM

## 2017-02-07 NOTE — Progress Notes (Signed)
Subjective:  Pt denies feeling short of breath, denies chest pain.  Unable to cough up much sputum. Pt feels he has turned a corner.  He was upset about his current diet..  Switched pt to regular diet.  Objective:  Vital signs in last 24 hours: Vitals:   02/07/17 0810 02/07/17 0830 02/07/17 1222 02/07/17 1425  BP:  (!) 141/119 (!) 136/109   Pulse:  91 94   Resp:  13 (!) 24   Temp:  98.7 F (37.1 C) 99.3 F (37.4 C)   TempSrc:  Oral Oral   SpO2: 99% 100% 100% 95%  Weight:      Height:        Physical Exam  Constitutional: He is oriented to person, place, and time. He appears well-developed and well-nourished.  Eyes: Right eye exhibits no discharge. Left eye exhibits no discharge. No scleral icterus.  Neck: JVD (6cm) present.  Cardiovascular: Normal rate, normal heart sounds and intact distal pulses. An irregularly irregular rhythm present. Exam reveals no gallop and no friction rub.  No murmur heard. Pulmonary/Chest: Effort normal. No respiratory distress. Unable to hear much wheezing today. He has rhonchi in the right middle field. He has no rales.  Bronchial breath sounds throughout  Abdominal: Soft. Bowel sounds are normal. He exhibits no distension and no mass. There is no tenderness. There is no guarding.  Musculoskeletal:       Right lower leg: He exhibits no edema.       Left lower leg: He exhibits no edema.  Neurological: He is alert and oriented to person, place, and time.  Skin:  Stage 3 sacral pressure ulcer.  1cm diameter erythematous and tender. No purulent material seen or discharge   Stasis dermatitis on distal bilateral lower extremities      Assessment/Plan:  Principal Problem:   Community acquired pneumonia Active Problems:   Essential hypertension   Permanent atrial fibrillation (HCC)   Pressure injury of sacral region, stage 2   Acute on chronic systolic (congestive) heart failure ([place])  Community aquired pneumonia w/risk factors of MDR: pt with  history of pneumonias due to immunosuppression by azathioprine, prednisone for his treatment of MG.  Found to have new multifocal pneumonia in lower lobes worse in R middle lobe.     -WBC count continuing to trend down, remains afebrile -vanc and azithro complete, on cefepime, will discharge with 7 days of cefpodoxime -will start mucinex   Permanent Atrial fibrillation: pt was on warfarin but taken off due to consistently being supratherapeutic, he is now on aspirin 81mg  every day plavix 75mg  every other day.   -Unusual anticoagulation regimen, will look into this more closely, continue for now.   -diltiazem gtt, switched back to home metoprolol with good result   COPD: not on O[facility] at [facility], well controlled on Dulera, minimal wheezing on exam    -begin with scheduled duoneb therapy every 6 hours transition to PRN with home meds   Myasthenia Gravis: chronic, stable, on azathioprine 50mg  BID,  prednisone 10mg  daily ,    -continue   HFrEF: no prior ECHO or diagnosis, pt on lasix 40mg  daily, which was attributed to chronic venous stasis but had high suspicion of HFrEF.  ECHO 02/06/17 shows EF 30-35% diffuse hypokinesis, mod TR, could not evaluate diastolic dysfunction due to afib.    -volume status looks great today will reinstitute home dose of lasix -on BB, will introduce other therapy as bp tolerates  HTN: chronic, stable   -  will reintroduce home meds as pt continues to stabilize   Degenerative disc disease: pt with cervical and lumbar disc disease, with prior surgical intervention   -continuing home oxycontin.     Sacral Pressure ulcer stage 3:  Pt presents with chronic sacral pressure ulcer, 1cm diameter erythematous and tender. No purulent material seen or discharge    -clean, dry, has dressing applied -continue wound care   Dispo: Anticipated discharge to SNF later today vs tomorrow.   Katherine Roan, MD 02/07/2017, 2:48 PM Vickki Muff MD PGY-1 Internal  Medicine Pager # [phone]

## 2017-02-07 NOTE — Progress Notes (Signed)
ANTIBIOTIC CONSULT NOTE   Pharmacy Consult for Cefepime Indication: pneumonia  No Known Allergies  Patient Measurements: Height: 6\' 1"  (185.4 cm) Weight: 208 lb 1.8 oz (94.4 kg) IBW/kg (Calculated) : 79.9 Adjusted Body Weight:   Vital Signs: Temp: 98.7 F (37.1 C) (12/11 0830) Temp Source: Oral (12/11 0830) BP: 141/119 (12/11 0830) Pulse Rate: 91 (12/11 0830) Intake/Output from previous day: 12/10 0701 - 12/11 0700 In: 720 [P.O.:720] Out: 1600 [Urine:1600] Intake/Output from this shift: Total I/O In: 360 [P.O.:360] Out: -   Labs: Recent Labs    02/05/17 0401 02/06/17 0324 02/07/17 0440  WBC 10.0 9.4 7.6  HGB 9.0* 9.3* 9.1*  PLT 157 155 163  CREATININE 1.02 0.89 0.93   Estimated Creatinine Clearance: 62 mL/min (by C-G formula based on SCr of 0.93 mg/dL). No results for input(s): VANCOTROUGH, VANCOPEAK, VANCORANDOM, GENTTROUGH, GENTPEAK, GENTRANDOM, TOBRATROUGH, TOBRAPEAK, TOBRARND, AMIKACINPEAK, AMIKACINTROU, AMIKACIN in the last 72 hours.   Microbiology:   Medical History: Past Medical History:  Diagnosis Date  . A-fib ([place])   . COPD with chronic bronchitis and emphysema ([place])   . Essential hypertension   . Femur fracture, right ([place])    Related to a fall. Periprosthetic distal femur fracture (close to the right knee prosthesis).  > Initial plans were to treat with brace, however, has now progressed and may require surgery  . GERD (gastroesophageal reflux disease)   . Glaucoma   . Hyperlipidemia   . Hypothyroidism (acquired)    On Levothyroxine  . Myasthenia gravis ([place])    Now (prior to recent femur fracture) was relegated to maneuvering himself around on a Rolling Walker (Rollator) using his feet & sitting on the walker.  Does not have arm strength to mover a wheelchair or transfer.;   . Osteoarthritis    Global osteoarthritis involving back, hips and knees as well as elbows.  . Paresthesia    bilateral upper extremities, very weak ; poor grip  (must  use adaptable silverware );   . Permanent atrial fibrillation (HCC)    Long-standing (> 4 yrs). Initially evaluated with Stress Test & Echo - Pt reports that these studies were "normal"   Assessment:  ID: HCAP; WBC 15.8>10.0>9.4>7.6, afeb. LA nml after fluids. SCr 0.93  Vancomycin 12/8 >> 12/9 Cefepime 12/8 >>  Azithromycin 12/9>>  12/8 BCx: ngtd 12/8 MRSA PCR: negative   Goal of Therapy:  Eradication of infection  Plan:  Continue Cefepime 2g IV q 12 hrs Pharmacy will sign off. Please reconsult for further dosing assitance.   Jaysean Manville S. Alford Highland, PharmD, [place] Clinical Staff Pharmacist Pager [phone]  Eilene Ghazi Stillinger 02/07/2017,10:03 AM

## 2017-02-07 NOTE — NC FL2 (Signed)
[place] MEDICAID FL2 LEVEL OF CARE SCREENING TOOL     IDENTIFICATION  Patient Name: Travis Ross Birthdate: 02/24/1927 Sex: male Admission Date (Current Location): 02/04/2017  [facility] and [place] Number:  Herbalist and Address:  [facility], [place] [address] [place], [place] [address]      Provider Number: [id number]  Attending Physician Name and Address:  Lucious Groves, DO  Relative Name and Phone Number:  Truett Perna, daughter, [phone]    Current Level of Care: Hospital Recommended Level of Care: [place] Prior Approval Number:    Date Approved/Denied:   PASRR Number: [id number]  Discharge Plan: SNF    Current Diagnoses: Patient Active Problem List   Diagnosis Date Noted  . Pressure injury of sacral region, stage 2 02/06/2017  . Acute on chronic systolic (congestive) heart failure ([place]) 02/06/2017  . Community acquired pneumonia 02/04/2017  . A-fib ([place])   . Periprosthetic fracture around internal prosthetic right knee joint 12/15/2016  . Failed total knee arthroplasty ([place]) 12/15/2016  . Permanent atrial fibrillation ([place]) 12/05/2016  . Pre-operative cardiovascular examination 12/05/2016  . Venous stasis of both lower extremities 12/05/2016  . Essential hypertension     Orientation RESPIRATION BLADDER Height & Weight     Time, Self, Situation, Place  O2(nasal cannula 1 L) Incontinent, External catheter Weight: 208 lb 1.8 oz (94.4 kg) Height:  6\' 1"  (185.4 cm)  BEHAVIORAL SYMPTOMS/MOOD NEUROLOGICAL BOWEL NUTRITION STATUS      Continent Diet(please see DC summary)  AMBULATORY STATUS COMMUNICATION OF NEEDS Skin   Extensive Assist Verbally PU Stage and Appropriate Care(PU stage II buttocks, foam dressing)                       Personal Care Assistance Level of Assistance  Bathing, Feeding, Dressing Bathing Assistance: Maximum assistance Feeding assistance: Independent Dressing Assistance:  Maximum assistance     Functional Limitations Info  Sight, Hearing, Speech Sight Info: Impaired Hearing Info: Impaired Speech Info: Adequate    SPECIAL CARE FACTORS FREQUENCY  PT (By licensed PT), OT (By licensed OT)     PT Frequency: 5x/week OT Frequency: 5x/week            Contractures Contractures Info: Not present    Additional Factors Info  Code Status, Allergies, Psychotropic Code Status Info: Partial Allergies Info: No Known Allergies Psychotropic Info: zoloft         Current Medications (02/07/2017):  This is the current hospital active medication list Current Facility-Administered Medications  Medication Dose Route Frequency Provider Last Rate Last Dose  . acetaminophen (TYLENOL) tablet 650 mg  650 mg Oral Q6H PRN Alphonzo Grieve, MD       Or  . acetaminophen (TYLENOL) suppository 650 mg  650 mg Rectal Q6H PRN Alphonzo Grieve, MD      . aspirin EC tablet 81 mg  81 mg Oral Daily Alphonzo Grieve, MD   81 mg at 02/07/17 0951  . atorvastatin (LIPITOR) tablet 10 mg  10 mg Oral QPM Alphonzo Grieve, MD   10 mg at 02/06/17 1746  . azaTHIOprine (IMURAN) tablet 50 mg  50 mg Oral BID Alphonzo Grieve, MD   50 mg at 02/07/17 0952  . brinzolamide (AZOPT) 1 % ophthalmic suspension 1 drop  1 drop Both Eyes BID Joni Reining C, DO   1 drop at 02/07/17 0957   And  . brimonidine (ALPHAGAN) 0.2 % ophthalmic solution 1 drop  1 drop Both  Eyes BID Lucious Groves, DO   1 drop at 02/07/17 8366  . ceFEPIme (MAXIPIME) 2 g in dextrose 5 % 50 mL IVPB  2 g Intravenous Q12H Bajbus, Lauren D, RPH   Stopped at 02/07/17 0657  . clopidogrel (PLAVIX) tablet 75 mg  75 mg Oral Clair Gulling, MD   75 mg at 02/06/17 0847  . docusate sodium (COLACE) capsule 100 mg  100 mg Oral BID Alphonzo Grieve, MD   100 mg at 02/07/17 0952  . fluticasone (FLONASE) 50 MCG/ACT nasal spray 1 spray  1 spray Each Nare Daily Alphonzo Grieve, MD   1 spray at 02/07/17 0957  . furosemide (LASIX) tablet 40 mg  40 mg  Oral Daily Katherine Roan, MD   40 mg at 02/07/17 2947  . guaiFENesin (MUCINEX) 12 hr tablet 600 mg  600 mg Oral BID Katherine Roan, MD   600 mg at 02/07/17 1131  . heparin injection 5,000 Units  5,000 Units Subcutaneous Q8H Alphonzo Grieve, MD   5,000 Units at 02/07/17 1423  . ipratropium-albuterol (DUONEB) 0.5-2.5 (3) MG/3ML nebulizer solution 3 mL  3 mL Nebulization Once Delora Fuel, MD      . ipratropium-albuterol (DUONEB) 0.5-2.5 (3) MG/3ML nebulizer solution 3 mL  3 mL Nebulization Q6H WA Alphonzo Grieve, MD   3 mL at 02/07/17 1425  . latanoprost (XALATAN) 0.005 % ophthalmic solution 1 drop  1 drop Both Eyes QHS Alphonzo Grieve, MD   1 drop at 02/06/17 2237  . levothyroxine (SYNTHROID, LEVOTHROID) tablet 175 mcg  175 mcg Oral QAC breakfast Alphonzo Grieve, MD   175 mcg at 02/07/17 0733  . metoprolol succinate (TOPROL-XL) 24 hr tablet 50 mg  50 mg Oral Daily Katherine Roan, MD   50 mg at 02/07/17 6546  . mometasone-formoterol (DULERA) 100-5 MCG/ACT inhaler 2 puff  2 puff Inhalation BID Alphonzo Grieve, MD   2 puff at 02/07/17 0806  . oxyCODONE (OXYCONTIN) 12 hr tablet 30 mg  30 mg Oral Q12H HoffmanCindee Salt C, DO   30 mg at 02/07/17 5035  . pantoprazole (PROTONIX) EC tablet 40 mg  40 mg Oral BID Alphonzo Grieve, MD   40 mg at 02/07/17 0952  . polycarbophil (FIBERCON) tablet 625 mg  625 mg Oral QHS Alphonzo Grieve, MD   625 mg at 02/06/17 2233  . polyethylene glycol (MIRALAX / GLYCOLAX) packet 17 g  17 g Oral Daily Alphonzo Grieve, MD   17 g at 02/07/17 0952  . potassium chloride SA (K-DUR,KLOR-CON) CR tablet 40 mEq  40 mEq Oral BID Katherine Roan, MD   40 mEq at 02/07/17 0733  . predniSONE (DELTASONE) tablet 10 mg  10 mg Oral Q breakfast Alphonzo Grieve, MD   10 mg at 02/07/17 0733  . psyllium (HYDROCIL/METAMUCIL) packet 1 packet  1 packet Oral QHS Alphonzo Grieve, MD   1 packet at 02/06/17 2234  . senna (SENOKOT) tablet 8.6 mg  1 tablet Oral QPM Alphonzo Grieve, MD   8.6 mg at  02/06/17 1746  . sertraline (ZOLOFT) tablet 150 mg  150 mg Oral Daily Alphonzo Grieve, MD   150 mg at 02/07/17 0952  . temazepam (RESTORIL) capsule 30 mg  30 mg Oral QHS Alphonzo Grieve, MD   30 mg at 02/06/17 2232     Discharge Medications: Please see discharge summary for a list of discharge medications.  Relevant Imaging Results:  Relevant Lab Results:   Additional Information SSN: [id number]  Estanislado Emms, LCSW

## 2017-02-07 NOTE — Progress Notes (Signed)
Physical Therapy Treatment Patient Details Name: Travis Ross MRN: [id number] DOB: 16-Nov-1926 Today's Date: 02/07/2017    History of Present Illness 83 year old man with PMH significant for Afib, COPD, HTN, GERD, Myasthenia Gravis, Hypothyroidism, BL total knee arthroplasties with a most recent revision of the R on 10/18. Also known history of C3 cervical cord compression which is causing current BL upper extremity and hand weakness. Pt presents with 3d of worsening short of breath and cough w/ sputum production brought in from [facility] by EMS.     PT Comments    Due to general B LE weakness and specific L LE weakness and decreased coordination, therapy emphasized standing at EOB in the RW, sit to stands with stress on good standing foundation,  Also worked on trunk control while getting to EOB and maintaining upright posture at EOB    Follow Up Recommendations  SNF     Equipment Recommendations  None recommended by PT    Recommendations for Other Services       Precautions / Restrictions Precautions Precautions: Fall    Mobility  Bed Mobility Overal bed mobility: Needs Assistance Bed Mobility: Supine to Sit     Supine to sit: Min assist;HOB elevated;+2 for safety/equipment     General bed mobility comments: cues for sequencing, assist to help legs to EOB.  Assist to help trunk forward.  pt struggled without assist to scoot to EOB  Transfers Overall transfer level: Needs assistance Equipment used: Rolling walker (2 wheeled) Transfers: Sit to/from [facility] Sit to Stand: Mod assist;+2 physical assistance;From elevated surface(x4) Stand pivot transfers: Max assist;+2 physical assistance       General transfer comment: cues for hand placement and cues/assist to get LE placed in proper positioning.  Significant assist to help pt come forward even more than boosting  Ambulation/Gait             General Gait Details:  unable   Stairs            Wheelchair Mobility    Modified Rankin (Stroke Patients Only)       Balance Overall balance assessment: Needs assistance Sitting-balance support: No upper extremity supported;Feet supported Sitting balance-Leahy Scale: Fair       Standing balance-Leahy Scale: Zero Standing balance comment: Stood x4 at EOB.  Therapist never able to fully get pt's trunk over his BOS.  Heavy reliance on assist                            Cognition Arousal/Alertness: Awake/alert Behavior During Therapy: WFL for tasks assessed/performed Overall Cognitive Status: Within Functional Limits for tasks assessed                                        Exercises General Exercises - Lower Extremity Heel Slides: AAROM;AROM;Both;10 reps;Supine(graded resistance for gross extension)    General Comments        Pertinent Vitals/Pain Pain Assessment: No/denies pain    Home Living                      Prior Function            PT Goals (current goals can now be found in the care plan section) Acute Rehab PT Goals Patient Stated Goal: Improve breathing PT Goal Formulation: With patient Time  For Goal Achievement: 02/19/17 Potential to Achieve Goals: Fair Progress towards PT goals: Progressing toward goals    Frequency    Min 2X/week      PT Plan Current plan remains appropriate    Co-evaluation              AM-PAC PT "6 Clicks" Daily Activity  Outcome Measure  Difficulty turning over in bed (including adjusting bedclothes, sheets and blankets)?: A Little Difficulty moving from lying on back to sitting on the side of the bed? : Unable Difficulty sitting down on and standing up from a chair with arms (e.g., wheelchair, bedside commode, etc,.)?: Unable Help needed moving to and from a bed to chair (including a wheelchair)?: A Little Help needed walking in hospital room?: Total Help needed climbing 3-5 steps with a  railing? : Total 6 Click Score: 10    End of Session   Activity Tolerance: Patient limited by fatigue Patient left: in chair;with call bell/phone within reach;with chair alarm set;Other (comment)(lift pad left under pt.) Nurse Communication: Mobility status;Need for lift equipment PT Visit Diagnosis: Muscle weakness (generalized) (M62.81);Other abnormalities of gait and mobility (R26.89)     Time: 8786-7672 PT Time Calculation (min) (ACUTE ONLY): 30 min  Charges:  $Therapeutic Activity: 23-37 mins                    G Codes:       2017-02-23  Donnella Sham, PT [phone]  (pager)   Travis Ross Feb 23, 2017, 3:12 PM

## 2017-02-08 DIAGNOSIS — R7989 Other specified abnormal findings of blood chemistry: Secondary | ICD-10-CM | POA: Diagnosis not present

## 2017-02-08 DIAGNOSIS — R261 Paralytic gait: Secondary | ICD-10-CM | POA: Diagnosis not present

## 2017-02-08 DIAGNOSIS — L89153 Pressure ulcer of sacral region, stage 3: Secondary | ICD-10-CM | POA: Diagnosis not present

## 2017-02-08 DIAGNOSIS — R278 Other lack of coordination: Secondary | ICD-10-CM | POA: Diagnosis not present

## 2017-02-08 DIAGNOSIS — L8992 Pressure ulcer of unspecified site, stage 2: Secondary | ICD-10-CM | POA: Diagnosis not present

## 2017-02-08 DIAGNOSIS — I4891 Unspecified atrial fibrillation: Secondary | ICD-10-CM | POA: Diagnosis not present

## 2017-02-08 DIAGNOSIS — L89159 Pressure ulcer of sacral region, unspecified stage: Secondary | ICD-10-CM | POA: Diagnosis not present

## 2017-02-08 DIAGNOSIS — M6281 Muscle weakness (generalized): Secondary | ICD-10-CM | POA: Diagnosis not present

## 2017-02-08 DIAGNOSIS — I5023 Acute on chronic systolic (congestive) heart failure: Secondary | ICD-10-CM | POA: Diagnosis not present

## 2017-02-08 DIAGNOSIS — I10 Essential (primary) hypertension: Secondary | ICD-10-CM | POA: Diagnosis not present

## 2017-02-08 DIAGNOSIS — I5040 Unspecified combined systolic (congestive) and diastolic (congestive) heart failure: Secondary | ICD-10-CM | POA: Diagnosis not present

## 2017-02-08 DIAGNOSIS — I5020 Unspecified systolic (congestive) heart failure: Secondary | ICD-10-CM | POA: Diagnosis not present

## 2017-02-08 DIAGNOSIS — R293 Abnormal posture: Secondary | ICD-10-CM | POA: Diagnosis not present

## 2017-02-08 DIAGNOSIS — J189 Pneumonia, unspecified organism: Secondary | ICD-10-CM | POA: Diagnosis not present

## 2017-02-08 DIAGNOSIS — I482 Chronic atrial fibrillation: Secondary | ICD-10-CM | POA: Diagnosis not present

## 2017-02-08 DIAGNOSIS — R1312 Dysphagia, oropharyngeal phase: Secondary | ICD-10-CM | POA: Diagnosis not present

## 2017-02-08 LAB — BASIC METABOLIC PANEL
ANION GAP: 7 (ref 5–15)
BUN: 28 mg/dL — ABNORMAL HIGH (ref 6–20)
CALCIUM: 9 mg/dL (ref 8.9–10.3)
CO2: 25 mmol/L (ref 22–32)
Chloride: 103 mmol/L (ref 101–111)
Creatinine, Ser: 0.83 mg/dL (ref 0.61–1.24)
GFR calc Af Amer: >60 mL/min (ref >60)
GFR calc non Af Amer: >60 mL/min (ref >60)
GLUCOSE: 89 mg/dL (ref 65–99)
Potassium: 4.6 mmol/L (ref 3.5–5.1)
Sodium: 135 mmol/L (ref 135–145)

## 2017-02-08 MED ORDER — WHITE PETROLATUM EX OINT
TOPICAL_OINTMENT | CUTANEOUS | Status: AC
  Administered 2017-02-08: 1
  Filled 2017-02-08: qty 28.35

## 2017-02-08 MED ORDER — FUROSEMIDE 20 MG PO TABS
20.0000 mg | ORAL_TABLET | Freq: Every day | ORAL | Status: DC
  Administered 2017-02-08: 20 mg via ORAL
  Filled 2017-02-08: qty 1

## 2017-02-08 MED ORDER — LOSARTAN POTASSIUM 25 MG PO TABS: 12.5000 mg | ORAL_TABLET | Freq: Every day | ORAL | Status: DC

## 2017-02-08 NOTE — Progress Notes (Signed)
Called report to [place], Therapist, sports at [facility]. Plan is for patient to discharge to [facility] via [facility] around 4 pm.

## 2017-02-08 NOTE — Progress Notes (Signed)
Subjective:  Pt continues to look and feel much better.  He denies feeling short of breath, denies chest pain.  He mentions some difficulty swallowing today but he is unsure if it was associated with a coughing fit he had.     Objective:  Vital signs in last 24 hours: Vitals:   02/08/17 0500 02/08/17 0728 02/08/17 0750 02/08/17 0959  BP: (!) 110/50  (!) 137/99 133/90  Pulse: 70  82 88  Resp: 14  14   Temp: 97.8 F (36.6 C)  98 F (36.7 C)   TempSrc: Axillary  Axillary   SpO2: 98% 98% 98%   Weight: 207 lb 14.3 oz (94.3 kg)     Height:        Physical Exam  Constitutional: He is oriented to person, place, and time. He appears well-developed and well-nourished.  Eyes: Right eye exhibits no discharge. Left eye exhibits no discharge. No scleral icterus.  Neck: JVD (7-8cm) present.  Cardiovascular: Normal rate, normal heart sounds and intact distal pulses. An irregularly irregular rhythm present. Exam reveals no gallop and no friction rub.  No murmur heard. Pulmonary/Chest: Effort normal. No respiratory distress. Unable to hear much wheezing today. He has rhonchi in the right middle field. He has no rales.  Bronchial breath sounds throughout  Abdominal: Soft. Bowel sounds are normal. He exhibits no distension and no mass. There is no tenderness. There is no guarding.  Musculoskeletal:       Right lower leg: He exhibits no edema.       Left lower leg: He exhibits no edema.  Neurological: He is alert and oriented to person, place, and time.  Skin:  Stage 3 sacral pressure ulcer.  1cm diameter erythematous and tender. No purulent material seen or discharge   Stasis dermatitis on distal bilateral lower extremities      Assessment/Plan:  Principal Problem:   Community acquired pneumonia Active Problems:   Essential hypertension   Permanent atrial fibrillation (HCC)   Pressure injury of sacral region, stage 2   Acute on chronic systolic (congestive) heart failure  ([place])  Community aquired pneumonia w/risk factors of MDR: pt with history of pneumonias due to immunosuppression by azathioprine, prednisone for his treatment of MG.  Found to have new multifocal pneumonia in lower lobes worse in R middle lobe.     -symptoms continue to improve -WBC count continuing to trend down, remains afebrile -vanc and azithro complete, on cefepime, will discharge with 7 days of cefpodoxime -started mucinex yesterday   Permanent Atrial fibrillation: pt was on warfarin but taken off due to consistently being supratherapeutic, he is now on aspirin 81mg  every day plavix 75mg  every other day.   -Unusual anticoagulation regimen, will look into this more closely, continue for now.   -diltiazem gtt, switched back to home metoprolol with good result   COPD: [facility], well controlled on Dulera, minimal wheezing on exam    -begin with scheduled duoneb therapy every 6 hours transition to PRN with home meds   Myasthenia Gravis: chronic, stable, on azathioprine 50mg  BID,  prednisone 10mg  daily ,    -continue home meds above -pt did report some possibly difficulty with swallowing, he did indicate he had a recent coughing fit that may or may not have been associated.  We will get a speech language consult to do a swallow study.    HFrEF: no prior ECHO or diagnosis, pt on lasix 40mg  daily, which was attributed to chronic  venous stasis but had high suspicion of HFrEF.  ECHO 02/06/17 shows EF 30-35% diffuse hypokinesis, mod TR, could not evaluate diastolic dysfunction due to afib.    -volume status slightly more elevated today, will try additional 20mg  PO lasix in afternoon.   -on BB, will introduce other therapy as bp tolerates  HTN: chronic, stable   -will reintroduce home meds as pt continues to stabilize   Degenerative disc disease: pt with cervical and lumbar disc disease, with prior surgical intervention   -continuing home oxycontin.     Sacral  Pressure ulcer stage 3:  Pt presents with chronic sacral pressure ulcer, 1cm diameter erythematous and tender. No purulent material seen or discharge    -clean, dry, has dressing applied -continue wound care   Dispo: Anticipated discharge to SNF later today.   Katherine Roan, MD 02/08/2017, 11:04 AM Vickki Muff MD PGY-1 Internal Medicine Pager # [phone]

## 2017-02-08 NOTE — Progress Notes (Signed)
Full IM progress note to follow; will get swallow eval and recs prior to discharge to SNF today.  Alphonzo Grieve, MD IMTS - PGY2 Pager [phone]

## 2017-02-08 NOTE — Clinical Social Work Placement (Signed)
   CLINICAL SOCIAL WORK PLACEMENT  NOTE  Date:  02/08/2017  Patient Details  Name: Travis Ross MRN: [id number] Date of Birth: 1926-07-28  Clinical Social Work is seeking post-discharge placement for this patient at the [place] level of care (*CSW will initial, date and re-position this form in  chart as items are completed):  Yes   Patient/family provided with [facility] Work Department's list of facilities offering this level of care within the geographic area requested by the patient (or if unable, by the patient's family).  Yes   Patient/family informed of their freedom to choose among providers that offer the needed level of care, that participate in Medicare, Medicaid or managed care program needed by the patient, have an available bed and are willing to accept the patient.  Yes   Patient/family informed of [facility]'s ownership interest in [facility] and [facility], as well as of the fact that they are under no obligation to receive care at these facilities.  PASRR submitted to EDS on       PASRR number received on       Existing PASRR number confirmed on 02/07/17     FL2 transmitted to all facilities in geographic area requested by pt/family on 02/07/17     FL2 transmitted to all facilities within larger geographic area on       Patient informed that his/her managed care company has contracts with or will negotiate with certain facilities, including the following:  [facility], [place]     Yes   Patient/family informed of bed offers received.  Patient chooses bed at [place], [place]     Physician recommends and patient chooses bed at      Patient to be transferred to [place] on 02/08/17.  Patient to be transferred to facility by PTAR     Patient family notified on 02/08/17 of transfer.  Name of family member notified:  Truett Perna, daughter     PHYSICIAN       Additional Comment:     _______________________________________________ Estanislado Emms, LCSW 02/08/2017, 10:08 AM

## 2017-02-08 NOTE — Progress Notes (Signed)
Internal Medicine Attending:   I saw and examined the patient. I reviewed the resident's note and I agree with the resident's findings and plan as documented in the resident's note. Reports breathing continues to improve, less productive cough.  Feels overall much better.  Appreciate for liberalization of diet. However reports regurgiation of food this AM "I almost choked" On exam: no distress, irr irr, mildly elevated JVD. Plan: Dr Shan Levans updated his daughter today,  Plan will be d/c to SNF via PTAR. Will be discharge with cefpodozime to finish course would have TOTAL Abx course of 7 days. 12/8 to 12/15. However would appreciate SLP eval prior to discharge due to his concern for choking while eating breakfast.

## 2017-02-08 NOTE — Clinical Social Work Note (Signed)
Clinical Social Work Assessment  Patient Details  Name: Travis Ross MRN: [id number] Date of Birth: 1926/07/14  Date of referral:  02/08/17               Reason for consult:  Facility Placement                Permission sought to share information with:  Facility Sport and exercise psychologist, Family Supports Permission granted to share information::  Yes, Verbal Permission Granted  Name::     Truett Perna  Agency::  SNFs, ALF  Relationship::  daughter  Contact Information:  [phone]  Housing/Transportation Living arrangements for the past 2 months:  [place] of Information:  Patient, Adult Children Patient Interpreter Needed:  None Criminal Activity/Legal Involvement Pertinent to Current Situation/Hospitalization:  No - Comment as needed Significant Relationships:  Adult Children Lives with:  Facility Resident Do you feel safe going back to the place where you live?  Yes Need for family participation in patient care:  Yes (Comment)  Care giving concerns:  Patient from ALF. PT recommending SNF.    Social Worker assessment / plan: CSW sent out initial referrals. Patient previously at [facility] after surgery in October. CSW met with patient at bedside and then daughter, Truett Perna. Patient and daughter agreeable to SNF at [facility] again. CSW consulted with supervisor and obtained approval for 5 day LOG. [facility] will accept LOG and have started patient's [employer] authorization request. [facility] ready to take patient today. CSW to support with discharge.  Employment status:  Retired Astronomer) PT Recommendations:  [place] / Referral to community resources:  [place]  Patient/Family's Response to care: Patient and daughter appreciative of care.  Patient/Family's Understanding of and Emotional Response to Diagnosis, Current Treatment, and Prognosis: Patient and daughter with very  good understanding of patient's condition and agreeable to SNF.  Emotional Assessment Appearance:  Appears stated age Attitude/Demeanor/Rapport:  Other(appropriate) Affect (typically observed):  Calm, Pleasant Orientation:  Oriented to Self, Oriented to Place, Oriented to  Time, Oriented to Situation Alcohol / Substance use:  Not Applicable Psych involvement (Current and /or in the community):  No (Comment)  Discharge Needs  Concerns to be addressed:  Discharge Planning Concerns, Care Coordination Readmission within the last 30 days:  No Current discharge risk:  Physical Impairment, Dependent with Mobility Barriers to Discharge:  No Barriers Identified   Estanislado Emms, LCSW 02/08/2017, 10:17 AM

## 2017-02-08 NOTE — Progress Notes (Signed)
Patient will discharge to [place]. Anticipated discharge date: 02/08/17 Family notified: Truett Perna, daughter Transportation by: Corey Harold  Nurse to call report to [phone].    CSW signing off.  Estanislado Emms, [place]  Clinical Social Worker

## 2017-02-08 NOTE — Evaluation (Signed)
Clinical/Bedside Swallow Evaluation Patient Details  Name: Travis Ross MRN: [id number] Date of Birth: 21-Jul-1926  Today's Date: 02/08/2017 Time: SLP Start Time (ACUTE ONLY): 2774 SLP Stop Time (ACUTE ONLY): 1520 SLP Time Calculation (min) (ACUTE ONLY): 22 min  Past Medical History:  Past Medical History:  Diagnosis Date  . A-fib ([place])   . CAP (community acquired pneumonia) 02/04/2017  . COPD with chronic bronchitis and emphysema ([place])   . Essential hypertension   . Femur fracture, right ([place])    Related to a fall. Periprosthetic distal femur fracture (close to the right knee prosthesis).  > Initial plans were to treat with brace, however, has now progressed and may require surgery  . GERD (gastroesophageal reflux disease)   . Glaucoma   . Hyperlipidemia   . Hypothyroidism (acquired)    On Levothyroxine  . Myasthenia gravis ([place])    Now (prior to recent femur fracture) was relegated to maneuvering himself around on a Rolling Walker (Rollator) using his feet & sitting on the walker.  Does not have arm strength to mover a wheelchair or transfer.;   . Osteoarthritis    Global osteoarthritis involving back, hips and knees as well as elbows.  . Paresthesia    bilateral upper extremities, very weak ; poor grip  (must use adaptable silverware );   . Permanent atrial fibrillation (HCC)    Long-standing (> 4 yrs). Initially evaluated with Stress Test & Echo - Pt reports that these studies were "normal"   Past Surgical History:  Past Surgical History:  Procedure Laterality Date  . BACK SURGERY     x 6  . CARPAL TUNNEL RELEASE    . CHOLECYSTECTOMY    . ELBOW SURGERY  08/2015   Total of 3 surgeries  . REPLACEMENT TOTAL KNEE Bilateral    Right TKA 2003, left TKA 2010    . TOTAL KNEE REVISION Right 12/15/2016   Procedure: RIGHT TOTAL KNEE REVISION;  Surgeon: Gaynelle Arabian, MD;  Location: [facility];  Service: Orthopedics;  Laterality: Right;   HPI:  83 year old man with PMH significant  for Afib, COPD, HTN, GERD, Myasthenia Gravis, Hypothyroidism, BL total knee arthroplasties with a most recent revision of the R on 10/18. Also known history of C3 cervical cord compression which is causing current BL upper extremity and hand weakness. Pt presents with 3d of worsening short of breath and cough w/ sputum production brought in from his [facility] by EMS.    Assessment / Plan / Recommendation Clinical Impression  Pt describes a h/o dysphagia requiring feeding tube and then thickened liquids when he was diagnosed with myasthenia gravis, but he denies that need for diet modifications PTA. He says that he is careful to take small bites that he can masticate well and tries to sit so that he is leaning forward, and when he does those things he has no trouble. SLP provided repositioning to an upright posture yet immediate coughing consistently followed straw sips of thin liquid. An additional pillow was then used behind his head to really make him lean forward (not just at 90 degrees), and no further coughing was noted. Suspect that this may be related to impaired timing from reduced oral control, and that keeping him more forward helps to reduce what may be premature spillage. SLP provided education about the risks of aspiration and the importance of using precautions, such as correct posture, to reduce the risk of aspiration. Recommend discharging on current diet but with SLP f/u at  SNF to monitor for tolerance and make any further adjustments if needed. SLP Visit Diagnosis: Dysphagia, unspecified (R13.10)    Aspiration Risk  Mild aspiration risk    Diet Recommendation Regular;Thin liquid   Liquid Administration via: Cup;Straw Medication Administration: Whole meds with puree Supervision: Staff to assist with self feeding;Full supervision/cueing for compensatory strategies Compensations: Slow rate;Small sips/bites Postural Changes: Other (Comment)(seated upright/leaning forward)     Other  Recommendations Oral Care Recommendations: Oral care BID   Follow up Recommendations Skilled Nursing facility      Frequency and Duration            Prognosis        Swallow Study   General HPI: 83 year old man with PMH significant for Afib, COPD, HTN, GERD, Myasthenia Gravis, Hypothyroidism, BL total knee arthroplasties with a most recent revision of the R on 10/18. Also known history of C3 cervical cord compression which is causing current BL upper extremity and hand weakness. Pt presents with 3d of worsening short of breath and cough w/ sputum production brought in from his [facility] by EMS.  Type of Study: Bedside Swallow Evaluation Previous Swallow Assessment: none in chart Diet Prior to this Study: Regular;Thin liquids Temperature Spikes Noted: No Respiratory Status: Room air History of Recent Intubation: No Behavior/Cognition: Alert;Cooperative;Pleasant mood Oral Care Completed by SLP: No Oral Cavity - Dentition: Adequate natural dentition Vision: Functional for self-feeding Self-Feeding Abilities: Able to feed self with adaptive devices Patient Positioning: Upright in bed Baseline Vocal Quality: Normal;Other (comment)(speech mildly dysarthric)    Oral/Motor/Sensory Function     Ice Chips Ice chips: Not tested   Thin Liquid Thin Liquid: Impaired Presentation: Self Fed;Straw Pharyngeal  Phase Impairments: Cough - Immediate    Nectar Thick Nectar Thick Liquid: Not tested   Honey Thick Honey Thick Liquid: Not tested   Puree Puree: Not tested   Solid   GO   Solid: Within functional limits Presentation: Self Travis Ross 02/08/2017,3:45 PM  Germain Osgood, M.A. CCC-SLP [phone]

## 2017-02-08 NOTE — Care Management Note (Signed)
Case Management Note  Patient Details  Name: Travis Ross MRN: [id number] Date of Birth: 20-Apr-1926  Subjective/Objective:   CAP, perm afib               Action/Plan: Discharge Planning: Chart reviewed. CSW arranged SNF -rehab. No NCM needs identified.   PCP  [name] MD  Expected Discharge Date:  02/08/17               Expected Discharge Plan:  [place]  In-House Referral:  Clinical Social Work  Discharge planning Services  CM Consult  Post Acute Care Choice:  NA Choice offered to:  NA  DME Arranged:  N/A DME Agency:  NA  HH Arranged:  NA HH Agency:  NA  Status of Service:  Completed, signed off  If discussed at [place] of Stay Meetings, dates discussed:    Additional Comments:  Erenest Rasher, RN 02/08/2017, 10:41 AM

## 2017-02-08 NOTE — Care Management Important Message (Signed)
Important Message  Patient Details  Name: Travis Ross MRN: [id number] Date of Birth: [birth year]   Medicare Important Message Given:  Yes    Erenest Rasher, RN 02/08/2017, 11:09 AM

## 2017-02-09 DIAGNOSIS — L89159 Pressure ulcer of sacral region, unspecified stage: Secondary | ICD-10-CM | POA: Diagnosis not present

## 2017-02-09 DIAGNOSIS — J189 Pneumonia, unspecified organism: Secondary | ICD-10-CM | POA: Diagnosis not present

## 2017-02-09 DIAGNOSIS — I4891 Unspecified atrial fibrillation: Secondary | ICD-10-CM | POA: Diagnosis not present

## 2017-02-09 DIAGNOSIS — I5020 Unspecified systolic (congestive) heart failure: Secondary | ICD-10-CM | POA: Diagnosis not present

## 2017-02-09 LAB — CULTURE, BLOOD (ROUTINE X 2)
CULTURE: NO GROWTH
Culture: NO GROWTH
SPECIAL REQUESTS: ADEQUATE
Special Requests: ADEQUATE

## 2017-02-11 DIAGNOSIS — I4891 Unspecified atrial fibrillation: Secondary | ICD-10-CM | POA: Diagnosis not present

## 2017-02-11 DIAGNOSIS — I5020 Unspecified systolic (congestive) heart failure: Secondary | ICD-10-CM | POA: Diagnosis not present

## 2017-02-11 DIAGNOSIS — L89159 Pressure ulcer of sacral region, unspecified stage: Secondary | ICD-10-CM | POA: Diagnosis not present

## 2017-02-11 DIAGNOSIS — I10 Essential (primary) hypertension: Secondary | ICD-10-CM | POA: Diagnosis not present

## 2017-02-11 DIAGNOSIS — J189 Pneumonia, unspecified organism: Secondary | ICD-10-CM | POA: Diagnosis not present

## 2017-02-14 DIAGNOSIS — G7000 Myasthenia gravis without (acute) exacerbation: Secondary | ICD-10-CM | POA: Diagnosis not present

## 2017-02-14 DIAGNOSIS — Z01818 Encounter for other preprocedural examination: Secondary | ICD-10-CM | POA: Diagnosis not present

## 2017-02-14 DIAGNOSIS — J449 Chronic obstructive pulmonary disease, unspecified: Secondary | ICD-10-CM | POA: Diagnosis not present

## 2017-02-14 DIAGNOSIS — J189 Pneumonia, unspecified organism: Secondary | ICD-10-CM | POA: Diagnosis not present

## 2017-02-14 DIAGNOSIS — E038 Other specified hypothyroidism: Secondary | ICD-10-CM | POA: Diagnosis not present

## 2017-02-14 DIAGNOSIS — M158 Other polyosteoarthritis: Secondary | ICD-10-CM | POA: Diagnosis not present

## 2017-02-16 DIAGNOSIS — R05 Cough: Secondary | ICD-10-CM | POA: Diagnosis not present

## 2017-02-27 DIAGNOSIS — G8929 Other chronic pain: Secondary | ICD-10-CM | POA: Diagnosis not present

## 2017-02-27 DIAGNOSIS — M9711XD Periprosthetic fracture around internal prosthetic right knee joint, subsequent encounter: Secondary | ICD-10-CM | POA: Diagnosis not present

## 2017-02-27 DIAGNOSIS — Z96651 Presence of right artificial knee joint: Secondary | ICD-10-CM | POA: Diagnosis not present

## 2017-02-28 DIAGNOSIS — M159 Polyosteoarthritis, unspecified: Secondary | ICD-10-CM | POA: Diagnosis not present

## 2017-02-28 DIAGNOSIS — I4891 Unspecified atrial fibrillation: Secondary | ICD-10-CM | POA: Diagnosis not present

## 2017-02-28 DIAGNOSIS — S7291XD Unspecified fracture of right femur, subsequent encounter for closed fracture with routine healing: Secondary | ICD-10-CM | POA: Diagnosis not present

## 2017-02-28 DIAGNOSIS — M5030 Other cervical disc degeneration, unspecified cervical region: Secondary | ICD-10-CM | POA: Diagnosis not present

## 2017-02-28 DIAGNOSIS — G7000 Myasthenia gravis without (acute) exacerbation: Secondary | ICD-10-CM | POA: Diagnosis not present

## 2017-02-28 DIAGNOSIS — J449 Chronic obstructive pulmonary disease, unspecified: Secondary | ICD-10-CM | POA: Diagnosis not present

## 2017-03-03 DIAGNOSIS — S7291XD Unspecified fracture of right femur, subsequent encounter for closed fracture with routine healing: Secondary | ICD-10-CM | POA: Diagnosis not present

## 2017-03-03 DIAGNOSIS — J449 Chronic obstructive pulmonary disease, unspecified: Secondary | ICD-10-CM | POA: Diagnosis not present

## 2017-03-03 DIAGNOSIS — G7000 Myasthenia gravis without (acute) exacerbation: Secondary | ICD-10-CM | POA: Diagnosis not present

## 2017-03-03 DIAGNOSIS — M5030 Other cervical disc degeneration, unspecified cervical region: Secondary | ICD-10-CM | POA: Diagnosis not present

## 2017-03-03 DIAGNOSIS — I4891 Unspecified atrial fibrillation: Secondary | ICD-10-CM | POA: Diagnosis not present

## 2017-03-03 DIAGNOSIS — M159 Polyosteoarthritis, unspecified: Secondary | ICD-10-CM | POA: Diagnosis not present

## 2017-03-07 DIAGNOSIS — S7291XD Unspecified fracture of right femur, subsequent encounter for closed fracture with routine healing: Secondary | ICD-10-CM | POA: Diagnosis not present

## 2017-03-07 DIAGNOSIS — G7000 Myasthenia gravis without (acute) exacerbation: Secondary | ICD-10-CM | POA: Diagnosis not present

## 2017-03-07 DIAGNOSIS — I4891 Unspecified atrial fibrillation: Secondary | ICD-10-CM | POA: Diagnosis not present

## 2017-03-07 DIAGNOSIS — M5030 Other cervical disc degeneration, unspecified cervical region: Secondary | ICD-10-CM | POA: Diagnosis not present

## 2017-03-07 DIAGNOSIS — M159 Polyosteoarthritis, unspecified: Secondary | ICD-10-CM | POA: Diagnosis not present

## 2017-03-07 DIAGNOSIS — J449 Chronic obstructive pulmonary disease, unspecified: Secondary | ICD-10-CM | POA: Diagnosis not present

## 2017-03-08 DIAGNOSIS — M5030 Other cervical disc degeneration, unspecified cervical region: Secondary | ICD-10-CM | POA: Diagnosis not present

## 2017-03-08 DIAGNOSIS — S7291XD Unspecified fracture of right femur, subsequent encounter for closed fracture with routine healing: Secondary | ICD-10-CM | POA: Diagnosis not present

## 2017-03-08 DIAGNOSIS — G7000 Myasthenia gravis without (acute) exacerbation: Secondary | ICD-10-CM | POA: Diagnosis not present

## 2017-03-08 DIAGNOSIS — M159 Polyosteoarthritis, unspecified: Secondary | ICD-10-CM | POA: Diagnosis not present

## 2017-03-08 DIAGNOSIS — J449 Chronic obstructive pulmonary disease, unspecified: Secondary | ICD-10-CM | POA: Diagnosis not present

## 2017-03-08 DIAGNOSIS — I4891 Unspecified atrial fibrillation: Secondary | ICD-10-CM | POA: Diagnosis not present

## 2017-03-09 DIAGNOSIS — M159 Polyosteoarthritis, unspecified: Secondary | ICD-10-CM | POA: Diagnosis not present

## 2017-03-09 DIAGNOSIS — M5030 Other cervical disc degeneration, unspecified cervical region: Secondary | ICD-10-CM | POA: Diagnosis not present

## 2017-03-09 DIAGNOSIS — J449 Chronic obstructive pulmonary disease, unspecified: Secondary | ICD-10-CM | POA: Diagnosis not present

## 2017-03-09 DIAGNOSIS — G7000 Myasthenia gravis without (acute) exacerbation: Secondary | ICD-10-CM | POA: Diagnosis not present

## 2017-03-09 DIAGNOSIS — I4891 Unspecified atrial fibrillation: Secondary | ICD-10-CM | POA: Diagnosis not present

## 2017-03-09 DIAGNOSIS — S7291XD Unspecified fracture of right femur, subsequent encounter for closed fracture with routine healing: Secondary | ICD-10-CM | POA: Diagnosis not present

## 2017-03-10 DIAGNOSIS — M5030 Other cervical disc degeneration, unspecified cervical region: Secondary | ICD-10-CM | POA: Diagnosis not present

## 2017-03-10 DIAGNOSIS — I4891 Unspecified atrial fibrillation: Secondary | ICD-10-CM | POA: Diagnosis not present

## 2017-03-10 DIAGNOSIS — G7000 Myasthenia gravis without (acute) exacerbation: Secondary | ICD-10-CM | POA: Diagnosis not present

## 2017-03-10 DIAGNOSIS — J449 Chronic obstructive pulmonary disease, unspecified: Secondary | ICD-10-CM | POA: Diagnosis not present

## 2017-03-10 DIAGNOSIS — S7291XD Unspecified fracture of right femur, subsequent encounter for closed fracture with routine healing: Secondary | ICD-10-CM | POA: Diagnosis not present

## 2017-03-10 DIAGNOSIS — M159 Polyosteoarthritis, unspecified: Secondary | ICD-10-CM | POA: Diagnosis not present

## 2017-03-13 DIAGNOSIS — G7000 Myasthenia gravis without (acute) exacerbation: Secondary | ICD-10-CM | POA: Diagnosis not present

## 2017-03-13 DIAGNOSIS — M5030 Other cervical disc degeneration, unspecified cervical region: Secondary | ICD-10-CM | POA: Diagnosis not present

## 2017-03-13 DIAGNOSIS — M159 Polyosteoarthritis, unspecified: Secondary | ICD-10-CM | POA: Diagnosis not present

## 2017-03-13 DIAGNOSIS — J449 Chronic obstructive pulmonary disease, unspecified: Secondary | ICD-10-CM | POA: Diagnosis not present

## 2017-03-13 DIAGNOSIS — S7291XD Unspecified fracture of right femur, subsequent encounter for closed fracture with routine healing: Secondary | ICD-10-CM | POA: Diagnosis not present

## 2017-03-13 DIAGNOSIS — I4891 Unspecified atrial fibrillation: Secondary | ICD-10-CM | POA: Diagnosis not present

## 2017-03-15 DIAGNOSIS — I4891 Unspecified atrial fibrillation: Secondary | ICD-10-CM | POA: Diagnosis not present

## 2017-03-15 DIAGNOSIS — M5030 Other cervical disc degeneration, unspecified cervical region: Secondary | ICD-10-CM | POA: Diagnosis not present

## 2017-03-15 DIAGNOSIS — G7000 Myasthenia gravis without (acute) exacerbation: Secondary | ICD-10-CM | POA: Diagnosis not present

## 2017-03-15 DIAGNOSIS — M159 Polyosteoarthritis, unspecified: Secondary | ICD-10-CM | POA: Diagnosis not present

## 2017-03-15 DIAGNOSIS — J449 Chronic obstructive pulmonary disease, unspecified: Secondary | ICD-10-CM | POA: Diagnosis not present

## 2017-03-15 DIAGNOSIS — S7291XD Unspecified fracture of right femur, subsequent encounter for closed fracture with routine healing: Secondary | ICD-10-CM | POA: Diagnosis not present

## 2017-03-16 DIAGNOSIS — G7000 Myasthenia gravis without (acute) exacerbation: Secondary | ICD-10-CM | POA: Diagnosis not present

## 2017-03-16 DIAGNOSIS — I4891 Unspecified atrial fibrillation: Secondary | ICD-10-CM | POA: Diagnosis not present

## 2017-03-16 DIAGNOSIS — S7291XD Unspecified fracture of right femur, subsequent encounter for closed fracture with routine healing: Secondary | ICD-10-CM | POA: Diagnosis not present

## 2017-03-16 DIAGNOSIS — J449 Chronic obstructive pulmonary disease, unspecified: Secondary | ICD-10-CM | POA: Diagnosis not present

## 2017-03-16 DIAGNOSIS — M159 Polyosteoarthritis, unspecified: Secondary | ICD-10-CM | POA: Diagnosis not present

## 2017-03-16 DIAGNOSIS — M5030 Other cervical disc degeneration, unspecified cervical region: Secondary | ICD-10-CM | POA: Diagnosis not present

## 2017-03-21 DIAGNOSIS — Z993 Dependence on wheelchair: Secondary | ICD-10-CM | POA: Diagnosis not present

## 2017-03-21 DIAGNOSIS — R69 Illness, unspecified: Secondary | ICD-10-CM | POA: Diagnosis not present

## 2017-03-21 DIAGNOSIS — R0989 Other specified symptoms and signs involving the circulatory and respiratory systems: Secondary | ICD-10-CM | POA: Diagnosis not present

## 2017-03-21 DIAGNOSIS — J188 Other pneumonia, unspecified organism: Secondary | ICD-10-CM | POA: Diagnosis not present

## 2017-03-21 DIAGNOSIS — J449 Chronic obstructive pulmonary disease, unspecified: Secondary | ICD-10-CM | POA: Diagnosis not present

## 2017-03-21 DIAGNOSIS — G7000 Myasthenia gravis without (acute) exacerbation: Secondary | ICD-10-CM | POA: Diagnosis not present

## 2017-03-21 DIAGNOSIS — R062 Wheezing: Secondary | ICD-10-CM | POA: Diagnosis not present

## 2017-03-21 DIAGNOSIS — M158 Other polyosteoarthritis: Secondary | ICD-10-CM | POA: Diagnosis not present

## 2017-03-21 DIAGNOSIS — E038 Other specified hypothyroidism: Secondary | ICD-10-CM | POA: Diagnosis not present

## 2017-03-21 DIAGNOSIS — Z96651 Presence of right artificial knee joint: Secondary | ICD-10-CM | POA: Diagnosis not present

## 2017-03-22 DIAGNOSIS — M5030 Other cervical disc degeneration, unspecified cervical region: Secondary | ICD-10-CM | POA: Diagnosis not present

## 2017-03-22 DIAGNOSIS — J449 Chronic obstructive pulmonary disease, unspecified: Secondary | ICD-10-CM | POA: Diagnosis not present

## 2017-03-22 DIAGNOSIS — S7291XD Unspecified fracture of right femur, subsequent encounter for closed fracture with routine healing: Secondary | ICD-10-CM | POA: Diagnosis not present

## 2017-03-22 DIAGNOSIS — G7000 Myasthenia gravis without (acute) exacerbation: Secondary | ICD-10-CM | POA: Diagnosis not present

## 2017-03-22 DIAGNOSIS — I4891 Unspecified atrial fibrillation: Secondary | ICD-10-CM | POA: Diagnosis not present

## 2017-03-22 DIAGNOSIS — M159 Polyosteoarthritis, unspecified: Secondary | ICD-10-CM | POA: Diagnosis not present

## 2017-03-23 DIAGNOSIS — R05 Cough: Secondary | ICD-10-CM | POA: Diagnosis not present

## 2017-03-30 DIAGNOSIS — M9711XD Periprosthetic fracture around internal prosthetic right knee joint, subsequent encounter: Secondary | ICD-10-CM | POA: Diagnosis not present

## 2017-03-30 DIAGNOSIS — G8929 Other chronic pain: Secondary | ICD-10-CM | POA: Diagnosis not present

## 2017-03-30 DIAGNOSIS — Z96651 Presence of right artificial knee joint: Secondary | ICD-10-CM | POA: Diagnosis not present

## 2017-04-11 DIAGNOSIS — J449 Chronic obstructive pulmonary disease, unspecified: Secondary | ICD-10-CM | POA: Diagnosis not present

## 2017-04-11 DIAGNOSIS — L89321 Pressure ulcer of left buttock, stage 1: Secondary | ICD-10-CM | POA: Diagnosis not present

## 2017-04-11 DIAGNOSIS — K5901 Slow transit constipation: Secondary | ICD-10-CM | POA: Diagnosis not present

## 2017-04-11 DIAGNOSIS — E7849 Other hyperlipidemia: Secondary | ICD-10-CM | POA: Diagnosis not present

## 2017-04-11 DIAGNOSIS — L89311 Pressure ulcer of right buttock, stage 1: Secondary | ICD-10-CM | POA: Diagnosis not present

## 2017-04-11 DIAGNOSIS — E038 Other specified hypothyroidism: Secondary | ICD-10-CM | POA: Diagnosis not present

## 2017-04-11 DIAGNOSIS — I10 Essential (primary) hypertension: Secondary | ICD-10-CM | POA: Diagnosis not present

## 2017-04-11 DIAGNOSIS — I5089 Other heart failure: Secondary | ICD-10-CM | POA: Diagnosis not present

## 2017-04-12 DIAGNOSIS — S91311A Laceration without foreign body, right foot, initial encounter: Secondary | ICD-10-CM | POA: Diagnosis not present

## 2017-04-12 DIAGNOSIS — B351 Tinea unguium: Secondary | ICD-10-CM | POA: Diagnosis not present

## 2017-04-12 DIAGNOSIS — L97511 Non-pressure chronic ulcer of other part of right foot limited to breakdown of skin: Secondary | ICD-10-CM | POA: Diagnosis not present

## 2017-04-14 DIAGNOSIS — M150 Primary generalized (osteo)arthritis: Secondary | ICD-10-CM | POA: Diagnosis not present

## 2017-04-14 DIAGNOSIS — J449 Chronic obstructive pulmonary disease, unspecified: Secondary | ICD-10-CM | POA: Diagnosis not present

## 2017-04-14 DIAGNOSIS — I482 Chronic atrial fibrillation: Secondary | ICD-10-CM | POA: Diagnosis not present

## 2017-04-14 DIAGNOSIS — I10 Essential (primary) hypertension: Secondary | ICD-10-CM | POA: Diagnosis not present

## 2017-04-14 DIAGNOSIS — M5030 Other cervical disc degeneration, unspecified cervical region: Secondary | ICD-10-CM | POA: Diagnosis not present

## 2017-04-14 DIAGNOSIS — G7000 Myasthenia gravis without (acute) exacerbation: Secondary | ICD-10-CM | POA: Diagnosis not present

## 2017-04-18 DIAGNOSIS — I10 Essential (primary) hypertension: Secondary | ICD-10-CM | POA: Diagnosis not present

## 2017-04-18 DIAGNOSIS — M5030 Other cervical disc degeneration, unspecified cervical region: Secondary | ICD-10-CM | POA: Diagnosis not present

## 2017-04-18 DIAGNOSIS — E785 Hyperlipidemia, unspecified: Secondary | ICD-10-CM | POA: Diagnosis not present

## 2017-04-18 DIAGNOSIS — M150 Primary generalized (osteo)arthritis: Secondary | ICD-10-CM | POA: Diagnosis not present

## 2017-04-18 DIAGNOSIS — I482 Chronic atrial fibrillation: Secondary | ICD-10-CM | POA: Diagnosis not present

## 2017-04-18 DIAGNOSIS — G7000 Myasthenia gravis without (acute) exacerbation: Secondary | ICD-10-CM | POA: Diagnosis not present

## 2017-04-18 DIAGNOSIS — J449 Chronic obstructive pulmonary disease, unspecified: Secondary | ICD-10-CM | POA: Diagnosis not present

## 2017-04-18 DIAGNOSIS — R6889 Other general symptoms and signs: Secondary | ICD-10-CM | POA: Diagnosis not present

## 2017-04-20 DIAGNOSIS — M5030 Other cervical disc degeneration, unspecified cervical region: Secondary | ICD-10-CM | POA: Diagnosis not present

## 2017-04-20 DIAGNOSIS — G7000 Myasthenia gravis without (acute) exacerbation: Secondary | ICD-10-CM | POA: Diagnosis not present

## 2017-04-20 DIAGNOSIS — M150 Primary generalized (osteo)arthritis: Secondary | ICD-10-CM | POA: Diagnosis not present

## 2017-04-20 DIAGNOSIS — J449 Chronic obstructive pulmonary disease, unspecified: Secondary | ICD-10-CM | POA: Diagnosis not present

## 2017-04-20 DIAGNOSIS — I482 Chronic atrial fibrillation: Secondary | ICD-10-CM | POA: Diagnosis not present

## 2017-04-20 DIAGNOSIS — I10 Essential (primary) hypertension: Secondary | ICD-10-CM | POA: Diagnosis not present

## 2017-04-21 DIAGNOSIS — R05 Cough: Secondary | ICD-10-CM | POA: Diagnosis not present

## 2017-04-24 DIAGNOSIS — M150 Primary generalized (osteo)arthritis: Secondary | ICD-10-CM | POA: Diagnosis not present

## 2017-04-24 DIAGNOSIS — I10 Essential (primary) hypertension: Secondary | ICD-10-CM | POA: Diagnosis not present

## 2017-04-24 DIAGNOSIS — J449 Chronic obstructive pulmonary disease, unspecified: Secondary | ICD-10-CM | POA: Diagnosis not present

## 2017-04-24 DIAGNOSIS — M5030 Other cervical disc degeneration, unspecified cervical region: Secondary | ICD-10-CM | POA: Diagnosis not present

## 2017-04-24 DIAGNOSIS — I482 Chronic atrial fibrillation: Secondary | ICD-10-CM | POA: Diagnosis not present

## 2017-04-24 DIAGNOSIS — G7000 Myasthenia gravis without (acute) exacerbation: Secondary | ICD-10-CM | POA: Diagnosis not present

## 2017-04-25 DIAGNOSIS — Z79899 Other long term (current) drug therapy: Secondary | ICD-10-CM | POA: Diagnosis not present

## 2017-04-25 DIAGNOSIS — R1319 Other dysphagia: Secondary | ICD-10-CM | POA: Diagnosis not present

## 2017-04-25 DIAGNOSIS — R69 Illness, unspecified: Secondary | ICD-10-CM | POA: Diagnosis not present

## 2017-04-25 DIAGNOSIS — J188 Other pneumonia, unspecified organism: Secondary | ICD-10-CM | POA: Diagnosis not present

## 2017-04-25 DIAGNOSIS — M130 Polyarthritis, unspecified: Secondary | ICD-10-CM | POA: Diagnosis not present

## 2017-04-25 DIAGNOSIS — J449 Chronic obstructive pulmonary disease, unspecified: Secondary | ICD-10-CM | POA: Diagnosis not present

## 2017-04-27 DIAGNOSIS — J449 Chronic obstructive pulmonary disease, unspecified: Secondary | ICD-10-CM | POA: Diagnosis not present

## 2017-04-27 DIAGNOSIS — M150 Primary generalized (osteo)arthritis: Secondary | ICD-10-CM | POA: Diagnosis not present

## 2017-04-27 DIAGNOSIS — I482 Chronic atrial fibrillation: Secondary | ICD-10-CM | POA: Diagnosis not present

## 2017-04-27 DIAGNOSIS — Z96651 Presence of right artificial knee joint: Secondary | ICD-10-CM | POA: Diagnosis not present

## 2017-04-27 DIAGNOSIS — G7000 Myasthenia gravis without (acute) exacerbation: Secondary | ICD-10-CM | POA: Diagnosis not present

## 2017-04-27 DIAGNOSIS — I10 Essential (primary) hypertension: Secondary | ICD-10-CM | POA: Diagnosis not present

## 2017-04-27 DIAGNOSIS — M9711XD Periprosthetic fracture around internal prosthetic right knee joint, subsequent encounter: Secondary | ICD-10-CM | POA: Diagnosis not present

## 2017-04-27 DIAGNOSIS — M5030 Other cervical disc degeneration, unspecified cervical region: Secondary | ICD-10-CM | POA: Diagnosis not present

## 2017-04-27 DIAGNOSIS — G8929 Other chronic pain: Secondary | ICD-10-CM | POA: Diagnosis not present

## 2017-05-01 DIAGNOSIS — I482 Chronic atrial fibrillation: Secondary | ICD-10-CM | POA: Diagnosis not present

## 2017-05-01 DIAGNOSIS — M150 Primary generalized (osteo)arthritis: Secondary | ICD-10-CM | POA: Diagnosis not present

## 2017-05-01 DIAGNOSIS — G7000 Myasthenia gravis without (acute) exacerbation: Secondary | ICD-10-CM | POA: Diagnosis not present

## 2017-05-01 DIAGNOSIS — J449 Chronic obstructive pulmonary disease, unspecified: Secondary | ICD-10-CM | POA: Diagnosis not present

## 2017-05-01 DIAGNOSIS — M5030 Other cervical disc degeneration, unspecified cervical region: Secondary | ICD-10-CM | POA: Diagnosis not present

## 2017-05-01 DIAGNOSIS — I10 Essential (primary) hypertension: Secondary | ICD-10-CM | POA: Diagnosis not present

## 2017-05-04 DIAGNOSIS — J449 Chronic obstructive pulmonary disease, unspecified: Secondary | ICD-10-CM | POA: Diagnosis not present

## 2017-05-04 DIAGNOSIS — M150 Primary generalized (osteo)arthritis: Secondary | ICD-10-CM | POA: Diagnosis not present

## 2017-05-04 DIAGNOSIS — G7000 Myasthenia gravis without (acute) exacerbation: Secondary | ICD-10-CM | POA: Diagnosis not present

## 2017-05-04 DIAGNOSIS — I10 Essential (primary) hypertension: Secondary | ICD-10-CM | POA: Diagnosis not present

## 2017-05-04 DIAGNOSIS — I482 Chronic atrial fibrillation: Secondary | ICD-10-CM | POA: Diagnosis not present

## 2017-05-04 DIAGNOSIS — M5030 Other cervical disc degeneration, unspecified cervical region: Secondary | ICD-10-CM | POA: Diagnosis not present

## 2017-05-08 DIAGNOSIS — I482 Chronic atrial fibrillation: Secondary | ICD-10-CM | POA: Diagnosis not present

## 2017-05-08 DIAGNOSIS — M150 Primary generalized (osteo)arthritis: Secondary | ICD-10-CM | POA: Diagnosis not present

## 2017-05-08 DIAGNOSIS — M5030 Other cervical disc degeneration, unspecified cervical region: Secondary | ICD-10-CM | POA: Diagnosis not present

## 2017-05-08 DIAGNOSIS — J449 Chronic obstructive pulmonary disease, unspecified: Secondary | ICD-10-CM | POA: Diagnosis not present

## 2017-05-08 DIAGNOSIS — I10 Essential (primary) hypertension: Secondary | ICD-10-CM | POA: Diagnosis not present

## 2017-05-08 DIAGNOSIS — G7000 Myasthenia gravis without (acute) exacerbation: Secondary | ICD-10-CM | POA: Diagnosis not present

## 2017-05-11 DIAGNOSIS — I482 Chronic atrial fibrillation: Secondary | ICD-10-CM | POA: Diagnosis not present

## 2017-05-11 DIAGNOSIS — M5030 Other cervical disc degeneration, unspecified cervical region: Secondary | ICD-10-CM | POA: Diagnosis not present

## 2017-05-11 DIAGNOSIS — I10 Essential (primary) hypertension: Secondary | ICD-10-CM | POA: Diagnosis not present

## 2017-05-11 DIAGNOSIS — M150 Primary generalized (osteo)arthritis: Secondary | ICD-10-CM | POA: Diagnosis not present

## 2017-05-11 DIAGNOSIS — G7000 Myasthenia gravis without (acute) exacerbation: Secondary | ICD-10-CM | POA: Diagnosis not present

## 2017-05-11 DIAGNOSIS — J449 Chronic obstructive pulmonary disease, unspecified: Secondary | ICD-10-CM | POA: Diagnosis not present

## 2017-05-15 DIAGNOSIS — I482 Chronic atrial fibrillation: Secondary | ICD-10-CM | POA: Diagnosis not present

## 2017-05-15 DIAGNOSIS — J449 Chronic obstructive pulmonary disease, unspecified: Secondary | ICD-10-CM | POA: Diagnosis not present

## 2017-05-15 DIAGNOSIS — M150 Primary generalized (osteo)arthritis: Secondary | ICD-10-CM | POA: Diagnosis not present

## 2017-05-15 DIAGNOSIS — M5030 Other cervical disc degeneration, unspecified cervical region: Secondary | ICD-10-CM | POA: Diagnosis not present

## 2017-05-15 DIAGNOSIS — I10 Essential (primary) hypertension: Secondary | ICD-10-CM | POA: Diagnosis not present

## 2017-05-15 DIAGNOSIS — G7000 Myasthenia gravis without (acute) exacerbation: Secondary | ICD-10-CM | POA: Diagnosis not present

## 2017-05-17 DIAGNOSIS — I10 Essential (primary) hypertension: Secondary | ICD-10-CM | POA: Diagnosis not present

## 2017-05-17 DIAGNOSIS — J449 Chronic obstructive pulmonary disease, unspecified: Secondary | ICD-10-CM | POA: Diagnosis not present

## 2017-05-17 DIAGNOSIS — M150 Primary generalized (osteo)arthritis: Secondary | ICD-10-CM | POA: Diagnosis not present

## 2017-05-17 DIAGNOSIS — I482 Chronic atrial fibrillation: Secondary | ICD-10-CM | POA: Diagnosis not present

## 2017-05-17 DIAGNOSIS — M5030 Other cervical disc degeneration, unspecified cervical region: Secondary | ICD-10-CM | POA: Diagnosis not present

## 2017-05-17 DIAGNOSIS — G7000 Myasthenia gravis without (acute) exacerbation: Secondary | ICD-10-CM | POA: Diagnosis not present

## 2017-05-28 DIAGNOSIS — Z96651 Presence of right artificial knee joint: Secondary | ICD-10-CM | POA: Diagnosis not present

## 2017-05-28 DIAGNOSIS — M9711XD Periprosthetic fracture around internal prosthetic right knee joint, subsequent encounter: Secondary | ICD-10-CM | POA: Diagnosis not present

## 2017-05-28 DIAGNOSIS — G8929 Other chronic pain: Secondary | ICD-10-CM | POA: Diagnosis not present

## 2017-05-30 DIAGNOSIS — Z79899 Other long term (current) drug therapy: Secondary | ICD-10-CM | POA: Diagnosis not present

## 2017-05-30 DIAGNOSIS — G7000 Myasthenia gravis without (acute) exacerbation: Secondary | ICD-10-CM | POA: Diagnosis not present

## 2017-05-30 DIAGNOSIS — S8992XA Unspecified injury of left lower leg, initial encounter: Secondary | ICD-10-CM | POA: Diagnosis not present

## 2017-05-30 DIAGNOSIS — J449 Chronic obstructive pulmonary disease, unspecified: Secondary | ICD-10-CM | POA: Diagnosis not present

## 2017-05-30 DIAGNOSIS — M130 Polyarthritis, unspecified: Secondary | ICD-10-CM | POA: Diagnosis not present

## 2017-05-30 DIAGNOSIS — K5901 Slow transit constipation: Secondary | ICD-10-CM | POA: Diagnosis not present

## 2017-05-30 DIAGNOSIS — R69 Illness, unspecified: Secondary | ICD-10-CM | POA: Diagnosis not present

## 2017-05-30 DIAGNOSIS — E038 Other specified hypothyroidism: Secondary | ICD-10-CM | POA: Diagnosis not present

## 2017-06-11 DIAGNOSIS — R05 Cough: Secondary | ICD-10-CM | POA: Diagnosis not present

## 2017-06-11 DIAGNOSIS — R0989 Other specified symptoms and signs involving the circulatory and respiratory systems: Secondary | ICD-10-CM | POA: Diagnosis not present

## 2017-06-13 DIAGNOSIS — L089 Local infection of the skin and subcutaneous tissue, unspecified: Secondary | ICD-10-CM | POA: Diagnosis not present

## 2017-06-13 DIAGNOSIS — S8992XD Unspecified injury of left lower leg, subsequent encounter: Secondary | ICD-10-CM | POA: Diagnosis not present

## 2017-06-13 DIAGNOSIS — Z79899 Other long term (current) drug therapy: Secondary | ICD-10-CM | POA: Diagnosis not present

## 2017-06-15 DIAGNOSIS — M150 Primary generalized (osteo)arthritis: Secondary | ICD-10-CM | POA: Diagnosis not present

## 2017-06-15 DIAGNOSIS — J181 Lobar pneumonia, unspecified organism: Secondary | ICD-10-CM | POA: Diagnosis not present

## 2017-06-15 DIAGNOSIS — J440 Chronic obstructive pulmonary disease with acute lower respiratory infection: Secondary | ICD-10-CM | POA: Diagnosis not present

## 2017-06-15 DIAGNOSIS — G7000 Myasthenia gravis without (acute) exacerbation: Secondary | ICD-10-CM | POA: Diagnosis not present

## 2017-06-15 DIAGNOSIS — L02416 Cutaneous abscess of left lower limb: Secondary | ICD-10-CM | POA: Diagnosis not present

## 2017-06-15 DIAGNOSIS — S51011D Laceration without foreign body of right elbow, subsequent encounter: Secondary | ICD-10-CM | POA: Diagnosis not present

## 2017-06-17 DIAGNOSIS — J181 Lobar pneumonia, unspecified organism: Secondary | ICD-10-CM | POA: Diagnosis not present

## 2017-06-17 DIAGNOSIS — S51011D Laceration without foreign body of right elbow, subsequent encounter: Secondary | ICD-10-CM | POA: Diagnosis not present

## 2017-06-17 DIAGNOSIS — M150 Primary generalized (osteo)arthritis: Secondary | ICD-10-CM | POA: Diagnosis not present

## 2017-06-17 DIAGNOSIS — J440 Chronic obstructive pulmonary disease with acute lower respiratory infection: Secondary | ICD-10-CM | POA: Diagnosis not present

## 2017-06-17 DIAGNOSIS — G7000 Myasthenia gravis without (acute) exacerbation: Secondary | ICD-10-CM | POA: Diagnosis not present

## 2017-06-17 DIAGNOSIS — L02416 Cutaneous abscess of left lower limb: Secondary | ICD-10-CM | POA: Diagnosis not present

## 2017-06-20 DIAGNOSIS — J181 Lobar pneumonia, unspecified organism: Secondary | ICD-10-CM | POA: Diagnosis not present

## 2017-06-20 DIAGNOSIS — L02416 Cutaneous abscess of left lower limb: Secondary | ICD-10-CM | POA: Diagnosis not present

## 2017-06-20 DIAGNOSIS — S51011D Laceration without foreign body of right elbow, subsequent encounter: Secondary | ICD-10-CM | POA: Diagnosis not present

## 2017-06-20 DIAGNOSIS — J440 Chronic obstructive pulmonary disease with acute lower respiratory infection: Secondary | ICD-10-CM | POA: Diagnosis not present

## 2017-06-20 DIAGNOSIS — M150 Primary generalized (osteo)arthritis: Secondary | ICD-10-CM | POA: Diagnosis not present

## 2017-06-20 DIAGNOSIS — G7000 Myasthenia gravis without (acute) exacerbation: Secondary | ICD-10-CM | POA: Diagnosis not present

## 2017-06-23 DIAGNOSIS — L02416 Cutaneous abscess of left lower limb: Secondary | ICD-10-CM | POA: Diagnosis not present

## 2017-06-23 DIAGNOSIS — M150 Primary generalized (osteo)arthritis: Secondary | ICD-10-CM | POA: Diagnosis not present

## 2017-06-23 DIAGNOSIS — G7000 Myasthenia gravis without (acute) exacerbation: Secondary | ICD-10-CM | POA: Diagnosis not present

## 2017-06-23 DIAGNOSIS — S51011D Laceration without foreign body of right elbow, subsequent encounter: Secondary | ICD-10-CM | POA: Diagnosis not present

## 2017-06-23 DIAGNOSIS — J440 Chronic obstructive pulmonary disease with acute lower respiratory infection: Secondary | ICD-10-CM | POA: Diagnosis not present

## 2017-06-23 DIAGNOSIS — J181 Lobar pneumonia, unspecified organism: Secondary | ICD-10-CM | POA: Diagnosis not present

## 2017-06-26 DIAGNOSIS — M150 Primary generalized (osteo)arthritis: Secondary | ICD-10-CM | POA: Diagnosis not present

## 2017-06-26 DIAGNOSIS — J440 Chronic obstructive pulmonary disease with acute lower respiratory infection: Secondary | ICD-10-CM | POA: Diagnosis not present

## 2017-06-26 DIAGNOSIS — J181 Lobar pneumonia, unspecified organism: Secondary | ICD-10-CM | POA: Diagnosis not present

## 2017-06-26 DIAGNOSIS — G7000 Myasthenia gravis without (acute) exacerbation: Secondary | ICD-10-CM | POA: Diagnosis not present

## 2017-06-26 DIAGNOSIS — S51011D Laceration without foreign body of right elbow, subsequent encounter: Secondary | ICD-10-CM | POA: Diagnosis not present

## 2017-06-26 DIAGNOSIS — L02416 Cutaneous abscess of left lower limb: Secondary | ICD-10-CM | POA: Diagnosis not present

## 2017-06-27 DIAGNOSIS — M9711XD Periprosthetic fracture around internal prosthetic right knee joint, subsequent encounter: Secondary | ICD-10-CM | POA: Diagnosis not present

## 2017-06-27 DIAGNOSIS — Z96651 Presence of right artificial knee joint: Secondary | ICD-10-CM | POA: Diagnosis not present

## 2017-06-27 DIAGNOSIS — G8929 Other chronic pain: Secondary | ICD-10-CM | POA: Diagnosis not present

## 2017-06-28 DIAGNOSIS — J440 Chronic obstructive pulmonary disease with acute lower respiratory infection: Secondary | ICD-10-CM | POA: Diagnosis not present

## 2017-06-28 DIAGNOSIS — S51011D Laceration without foreign body of right elbow, subsequent encounter: Secondary | ICD-10-CM | POA: Diagnosis not present

## 2017-06-28 DIAGNOSIS — L02416 Cutaneous abscess of left lower limb: Secondary | ICD-10-CM | POA: Diagnosis not present

## 2017-06-28 DIAGNOSIS — M150 Primary generalized (osteo)arthritis: Secondary | ICD-10-CM | POA: Diagnosis not present

## 2017-06-28 DIAGNOSIS — G7000 Myasthenia gravis without (acute) exacerbation: Secondary | ICD-10-CM | POA: Diagnosis not present

## 2017-06-28 DIAGNOSIS — J181 Lobar pneumonia, unspecified organism: Secondary | ICD-10-CM | POA: Diagnosis not present

## 2017-06-29 DIAGNOSIS — Z96651 Presence of right artificial knee joint: Secondary | ICD-10-CM | POA: Diagnosis not present

## 2017-06-29 DIAGNOSIS — M1711 Unilateral primary osteoarthritis, right knee: Secondary | ICD-10-CM | POA: Diagnosis not present

## 2017-06-29 DIAGNOSIS — Z471 Aftercare following joint replacement surgery: Secondary | ICD-10-CM | POA: Diagnosis not present

## 2017-06-30 DIAGNOSIS — J181 Lobar pneumonia, unspecified organism: Secondary | ICD-10-CM | POA: Diagnosis not present

## 2017-06-30 DIAGNOSIS — G7000 Myasthenia gravis without (acute) exacerbation: Secondary | ICD-10-CM | POA: Diagnosis not present

## 2017-06-30 DIAGNOSIS — J440 Chronic obstructive pulmonary disease with acute lower respiratory infection: Secondary | ICD-10-CM | POA: Diagnosis not present

## 2017-06-30 DIAGNOSIS — S51011D Laceration without foreign body of right elbow, subsequent encounter: Secondary | ICD-10-CM | POA: Diagnosis not present

## 2017-06-30 DIAGNOSIS — M150 Primary generalized (osteo)arthritis: Secondary | ICD-10-CM | POA: Diagnosis not present

## 2017-06-30 DIAGNOSIS — L02416 Cutaneous abscess of left lower limb: Secondary | ICD-10-CM | POA: Diagnosis not present

## 2017-07-03 DIAGNOSIS — J440 Chronic obstructive pulmonary disease with acute lower respiratory infection: Secondary | ICD-10-CM | POA: Diagnosis not present

## 2017-07-03 DIAGNOSIS — G7000 Myasthenia gravis without (acute) exacerbation: Secondary | ICD-10-CM | POA: Diagnosis not present

## 2017-07-03 DIAGNOSIS — S51011D Laceration without foreign body of right elbow, subsequent encounter: Secondary | ICD-10-CM | POA: Diagnosis not present

## 2017-07-03 DIAGNOSIS — M150 Primary generalized (osteo)arthritis: Secondary | ICD-10-CM | POA: Diagnosis not present

## 2017-07-03 DIAGNOSIS — L02416 Cutaneous abscess of left lower limb: Secondary | ICD-10-CM | POA: Diagnosis not present

## 2017-07-03 DIAGNOSIS — J181 Lobar pneumonia, unspecified organism: Secondary | ICD-10-CM | POA: Diagnosis not present

## 2017-07-04 DIAGNOSIS — R946 Abnormal results of thyroid function studies: Secondary | ICD-10-CM | POA: Diagnosis not present

## 2017-07-04 DIAGNOSIS — R7989 Other specified abnormal findings of blood chemistry: Secondary | ICD-10-CM | POA: Diagnosis not present

## 2017-07-04 DIAGNOSIS — Z13220 Encounter for screening for lipoid disorders: Secondary | ICD-10-CM | POA: Diagnosis not present

## 2017-07-04 DIAGNOSIS — R6889 Other general symptoms and signs: Secondary | ICD-10-CM | POA: Diagnosis not present

## 2017-07-05 DIAGNOSIS — J440 Chronic obstructive pulmonary disease with acute lower respiratory infection: Secondary | ICD-10-CM | POA: Diagnosis not present

## 2017-07-05 DIAGNOSIS — S51011D Laceration without foreign body of right elbow, subsequent encounter: Secondary | ICD-10-CM | POA: Diagnosis not present

## 2017-07-05 DIAGNOSIS — L02416 Cutaneous abscess of left lower limb: Secondary | ICD-10-CM | POA: Diagnosis not present

## 2017-07-05 DIAGNOSIS — M150 Primary generalized (osteo)arthritis: Secondary | ICD-10-CM | POA: Diagnosis not present

## 2017-07-05 DIAGNOSIS — J181 Lobar pneumonia, unspecified organism: Secondary | ICD-10-CM | POA: Diagnosis not present

## 2017-07-05 DIAGNOSIS — G7000 Myasthenia gravis without (acute) exacerbation: Secondary | ICD-10-CM | POA: Diagnosis not present

## 2017-07-07 DIAGNOSIS — G7000 Myasthenia gravis without (acute) exacerbation: Secondary | ICD-10-CM | POA: Diagnosis not present

## 2017-07-07 DIAGNOSIS — J181 Lobar pneumonia, unspecified organism: Secondary | ICD-10-CM | POA: Diagnosis not present

## 2017-07-07 DIAGNOSIS — M79622 Pain in left upper arm: Secondary | ICD-10-CM | POA: Diagnosis not present

## 2017-07-07 DIAGNOSIS — L02416 Cutaneous abscess of left lower limb: Secondary | ICD-10-CM | POA: Diagnosis not present

## 2017-07-07 DIAGNOSIS — J440 Chronic obstructive pulmonary disease with acute lower respiratory infection: Secondary | ICD-10-CM | POA: Diagnosis not present

## 2017-07-07 DIAGNOSIS — S51011D Laceration without foreign body of right elbow, subsequent encounter: Secondary | ICD-10-CM | POA: Diagnosis not present

## 2017-07-07 DIAGNOSIS — M7989 Other specified soft tissue disorders: Secondary | ICD-10-CM | POA: Diagnosis not present

## 2017-07-07 DIAGNOSIS — M25512 Pain in left shoulder: Secondary | ICD-10-CM | POA: Diagnosis not present

## 2017-07-07 DIAGNOSIS — M150 Primary generalized (osteo)arthritis: Secondary | ICD-10-CM | POA: Diagnosis not present

## 2017-07-11 DIAGNOSIS — M150 Primary generalized (osteo)arthritis: Secondary | ICD-10-CM | POA: Diagnosis not present

## 2017-07-11 DIAGNOSIS — J181 Lobar pneumonia, unspecified organism: Secondary | ICD-10-CM | POA: Diagnosis not present

## 2017-07-11 DIAGNOSIS — L02416 Cutaneous abscess of left lower limb: Secondary | ICD-10-CM | POA: Diagnosis not present

## 2017-07-11 DIAGNOSIS — J440 Chronic obstructive pulmonary disease with acute lower respiratory infection: Secondary | ICD-10-CM | POA: Diagnosis not present

## 2017-07-11 DIAGNOSIS — S51011D Laceration without foreign body of right elbow, subsequent encounter: Secondary | ICD-10-CM | POA: Diagnosis not present

## 2017-07-11 DIAGNOSIS — G7000 Myasthenia gravis without (acute) exacerbation: Secondary | ICD-10-CM | POA: Diagnosis not present

## 2017-07-13 DIAGNOSIS — J440 Chronic obstructive pulmonary disease with acute lower respiratory infection: Secondary | ICD-10-CM | POA: Diagnosis not present

## 2017-07-13 DIAGNOSIS — J181 Lobar pneumonia, unspecified organism: Secondary | ICD-10-CM | POA: Diagnosis not present

## 2017-07-13 DIAGNOSIS — S51011D Laceration without foreign body of right elbow, subsequent encounter: Secondary | ICD-10-CM | POA: Diagnosis not present

## 2017-07-13 DIAGNOSIS — G7000 Myasthenia gravis without (acute) exacerbation: Secondary | ICD-10-CM | POA: Diagnosis not present

## 2017-07-13 DIAGNOSIS — L02416 Cutaneous abscess of left lower limb: Secondary | ICD-10-CM | POA: Diagnosis not present

## 2017-07-13 DIAGNOSIS — M150 Primary generalized (osteo)arthritis: Secondary | ICD-10-CM | POA: Diagnosis not present

## 2017-07-15 DIAGNOSIS — J440 Chronic obstructive pulmonary disease with acute lower respiratory infection: Secondary | ICD-10-CM | POA: Diagnosis not present

## 2017-07-15 DIAGNOSIS — J181 Lobar pneumonia, unspecified organism: Secondary | ICD-10-CM | POA: Diagnosis not present

## 2017-07-15 DIAGNOSIS — S51011D Laceration without foreign body of right elbow, subsequent encounter: Secondary | ICD-10-CM | POA: Diagnosis not present

## 2017-07-15 DIAGNOSIS — G7000 Myasthenia gravis without (acute) exacerbation: Secondary | ICD-10-CM | POA: Diagnosis not present

## 2017-07-15 DIAGNOSIS — M150 Primary generalized (osteo)arthritis: Secondary | ICD-10-CM | POA: Diagnosis not present

## 2017-07-15 DIAGNOSIS — L02416 Cutaneous abscess of left lower limb: Secondary | ICD-10-CM | POA: Diagnosis not present

## 2017-07-17 DIAGNOSIS — S51011D Laceration without foreign body of right elbow, subsequent encounter: Secondary | ICD-10-CM | POA: Diagnosis not present

## 2017-07-17 DIAGNOSIS — M150 Primary generalized (osteo)arthritis: Secondary | ICD-10-CM | POA: Diagnosis not present

## 2017-07-17 DIAGNOSIS — J181 Lobar pneumonia, unspecified organism: Secondary | ICD-10-CM | POA: Diagnosis not present

## 2017-07-17 DIAGNOSIS — L02416 Cutaneous abscess of left lower limb: Secondary | ICD-10-CM | POA: Diagnosis not present

## 2017-07-17 DIAGNOSIS — G7000 Myasthenia gravis without (acute) exacerbation: Secondary | ICD-10-CM | POA: Diagnosis not present

## 2017-07-17 DIAGNOSIS — J440 Chronic obstructive pulmonary disease with acute lower respiratory infection: Secondary | ICD-10-CM | POA: Diagnosis not present

## 2017-07-19 DIAGNOSIS — J181 Lobar pneumonia, unspecified organism: Secondary | ICD-10-CM | POA: Diagnosis not present

## 2017-07-19 DIAGNOSIS — S51011D Laceration without foreign body of right elbow, subsequent encounter: Secondary | ICD-10-CM | POA: Diagnosis not present

## 2017-07-19 DIAGNOSIS — G7000 Myasthenia gravis without (acute) exacerbation: Secondary | ICD-10-CM | POA: Diagnosis not present

## 2017-07-19 DIAGNOSIS — M150 Primary generalized (osteo)arthritis: Secondary | ICD-10-CM | POA: Diagnosis not present

## 2017-07-19 DIAGNOSIS — J440 Chronic obstructive pulmonary disease with acute lower respiratory infection: Secondary | ICD-10-CM | POA: Diagnosis not present

## 2017-07-19 DIAGNOSIS — L02416 Cutaneous abscess of left lower limb: Secondary | ICD-10-CM | POA: Diagnosis not present

## 2017-07-21 DIAGNOSIS — M150 Primary generalized (osteo)arthritis: Secondary | ICD-10-CM | POA: Diagnosis not present

## 2017-07-21 DIAGNOSIS — L02416 Cutaneous abscess of left lower limb: Secondary | ICD-10-CM | POA: Diagnosis not present

## 2017-07-21 DIAGNOSIS — J181 Lobar pneumonia, unspecified organism: Secondary | ICD-10-CM | POA: Diagnosis not present

## 2017-07-21 DIAGNOSIS — S51011D Laceration without foreign body of right elbow, subsequent encounter: Secondary | ICD-10-CM | POA: Diagnosis not present

## 2017-07-21 DIAGNOSIS — J440 Chronic obstructive pulmonary disease with acute lower respiratory infection: Secondary | ICD-10-CM | POA: Diagnosis not present

## 2017-07-21 DIAGNOSIS — G7000 Myasthenia gravis without (acute) exacerbation: Secondary | ICD-10-CM | POA: Diagnosis not present

## 2017-07-24 DIAGNOSIS — J181 Lobar pneumonia, unspecified organism: Secondary | ICD-10-CM | POA: Diagnosis not present

## 2017-07-24 DIAGNOSIS — L02416 Cutaneous abscess of left lower limb: Secondary | ICD-10-CM | POA: Diagnosis not present

## 2017-07-24 DIAGNOSIS — M150 Primary generalized (osteo)arthritis: Secondary | ICD-10-CM | POA: Diagnosis not present

## 2017-07-24 DIAGNOSIS — J440 Chronic obstructive pulmonary disease with acute lower respiratory infection: Secondary | ICD-10-CM | POA: Diagnosis not present

## 2017-07-24 DIAGNOSIS — G7000 Myasthenia gravis without (acute) exacerbation: Secondary | ICD-10-CM | POA: Diagnosis not present

## 2017-07-24 DIAGNOSIS — S51011D Laceration without foreign body of right elbow, subsequent encounter: Secondary | ICD-10-CM | POA: Diagnosis not present

## 2017-07-26 DIAGNOSIS — L02416 Cutaneous abscess of left lower limb: Secondary | ICD-10-CM | POA: Diagnosis not present

## 2017-07-26 DIAGNOSIS — G7000 Myasthenia gravis without (acute) exacerbation: Secondary | ICD-10-CM | POA: Diagnosis not present

## 2017-07-26 DIAGNOSIS — S51011D Laceration without foreign body of right elbow, subsequent encounter: Secondary | ICD-10-CM | POA: Diagnosis not present

## 2017-07-26 DIAGNOSIS — M150 Primary generalized (osteo)arthritis: Secondary | ICD-10-CM | POA: Diagnosis not present

## 2017-07-26 DIAGNOSIS — J440 Chronic obstructive pulmonary disease with acute lower respiratory infection: Secondary | ICD-10-CM | POA: Diagnosis not present

## 2017-07-26 DIAGNOSIS — J181 Lobar pneumonia, unspecified organism: Secondary | ICD-10-CM | POA: Diagnosis not present

## 2017-07-27 DIAGNOSIS — R05 Cough: Secondary | ICD-10-CM | POA: Diagnosis not present

## 2017-07-27 DIAGNOSIS — R0989 Other specified symptoms and signs involving the circulatory and respiratory systems: Secondary | ICD-10-CM | POA: Diagnosis not present

## 2017-07-28 DIAGNOSIS — G7000 Myasthenia gravis without (acute) exacerbation: Secondary | ICD-10-CM | POA: Diagnosis not present

## 2017-07-28 DIAGNOSIS — J440 Chronic obstructive pulmonary disease with acute lower respiratory infection: Secondary | ICD-10-CM | POA: Diagnosis not present

## 2017-07-28 DIAGNOSIS — J181 Lobar pneumonia, unspecified organism: Secondary | ICD-10-CM | POA: Diagnosis not present

## 2017-07-28 DIAGNOSIS — Z96651 Presence of right artificial knee joint: Secondary | ICD-10-CM | POA: Diagnosis not present

## 2017-07-28 DIAGNOSIS — G8929 Other chronic pain: Secondary | ICD-10-CM | POA: Diagnosis not present

## 2017-07-28 DIAGNOSIS — S51011D Laceration without foreign body of right elbow, subsequent encounter: Secondary | ICD-10-CM | POA: Diagnosis not present

## 2017-07-28 DIAGNOSIS — L02416 Cutaneous abscess of left lower limb: Secondary | ICD-10-CM | POA: Diagnosis not present

## 2017-07-28 DIAGNOSIS — M150 Primary generalized (osteo)arthritis: Secondary | ICD-10-CM | POA: Diagnosis not present

## 2017-07-28 DIAGNOSIS — M9711XD Periprosthetic fracture around internal prosthetic right knee joint, subsequent encounter: Secondary | ICD-10-CM | POA: Diagnosis not present

## 2017-07-31 DIAGNOSIS — J181 Lobar pneumonia, unspecified organism: Secondary | ICD-10-CM | POA: Diagnosis not present

## 2017-07-31 DIAGNOSIS — J440 Chronic obstructive pulmonary disease with acute lower respiratory infection: Secondary | ICD-10-CM | POA: Diagnosis not present

## 2017-07-31 DIAGNOSIS — G7000 Myasthenia gravis without (acute) exacerbation: Secondary | ICD-10-CM | POA: Diagnosis not present

## 2017-07-31 DIAGNOSIS — L02416 Cutaneous abscess of left lower limb: Secondary | ICD-10-CM | POA: Diagnosis not present

## 2017-07-31 DIAGNOSIS — S51011D Laceration without foreign body of right elbow, subsequent encounter: Secondary | ICD-10-CM | POA: Diagnosis not present

## 2017-07-31 DIAGNOSIS — M150 Primary generalized (osteo)arthritis: Secondary | ICD-10-CM | POA: Diagnosis not present

## 2017-08-02 DIAGNOSIS — L02416 Cutaneous abscess of left lower limb: Secondary | ICD-10-CM | POA: Diagnosis not present

## 2017-08-02 DIAGNOSIS — M150 Primary generalized (osteo)arthritis: Secondary | ICD-10-CM | POA: Diagnosis not present

## 2017-08-02 DIAGNOSIS — J440 Chronic obstructive pulmonary disease with acute lower respiratory infection: Secondary | ICD-10-CM | POA: Diagnosis not present

## 2017-08-02 DIAGNOSIS — J181 Lobar pneumonia, unspecified organism: Secondary | ICD-10-CM | POA: Diagnosis not present

## 2017-08-02 DIAGNOSIS — S51011D Laceration without foreign body of right elbow, subsequent encounter: Secondary | ICD-10-CM | POA: Diagnosis not present

## 2017-08-02 DIAGNOSIS — G7000 Myasthenia gravis without (acute) exacerbation: Secondary | ICD-10-CM | POA: Diagnosis not present

## 2017-08-04 DIAGNOSIS — L02416 Cutaneous abscess of left lower limb: Secondary | ICD-10-CM | POA: Diagnosis not present

## 2017-08-04 DIAGNOSIS — M150 Primary generalized (osteo)arthritis: Secondary | ICD-10-CM | POA: Diagnosis not present

## 2017-08-04 DIAGNOSIS — S51011D Laceration without foreign body of right elbow, subsequent encounter: Secondary | ICD-10-CM | POA: Diagnosis not present

## 2017-08-04 DIAGNOSIS — J181 Lobar pneumonia, unspecified organism: Secondary | ICD-10-CM | POA: Diagnosis not present

## 2017-08-04 DIAGNOSIS — J440 Chronic obstructive pulmonary disease with acute lower respiratory infection: Secondary | ICD-10-CM | POA: Diagnosis not present

## 2017-08-04 DIAGNOSIS — G7000 Myasthenia gravis without (acute) exacerbation: Secondary | ICD-10-CM | POA: Diagnosis not present

## 2017-08-07 DIAGNOSIS — J440 Chronic obstructive pulmonary disease with acute lower respiratory infection: Secondary | ICD-10-CM | POA: Diagnosis not present

## 2017-08-07 DIAGNOSIS — S51011D Laceration without foreign body of right elbow, subsequent encounter: Secondary | ICD-10-CM | POA: Diagnosis not present

## 2017-08-07 DIAGNOSIS — M150 Primary generalized (osteo)arthritis: Secondary | ICD-10-CM | POA: Diagnosis not present

## 2017-08-07 DIAGNOSIS — G7000 Myasthenia gravis without (acute) exacerbation: Secondary | ICD-10-CM | POA: Diagnosis not present

## 2017-08-07 DIAGNOSIS — J181 Lobar pneumonia, unspecified organism: Secondary | ICD-10-CM | POA: Diagnosis not present

## 2017-08-07 DIAGNOSIS — L02416 Cutaneous abscess of left lower limb: Secondary | ICD-10-CM | POA: Diagnosis not present

## 2017-08-08 DIAGNOSIS — J189 Pneumonia, unspecified organism: Secondary | ICD-10-CM | POA: Diagnosis not present

## 2017-08-09 DIAGNOSIS — L97511 Non-pressure chronic ulcer of other part of right foot limited to breakdown of skin: Secondary | ICD-10-CM | POA: Diagnosis not present

## 2017-08-09 DIAGNOSIS — B351 Tinea unguium: Secondary | ICD-10-CM | POA: Diagnosis not present

## 2017-08-09 DIAGNOSIS — G7000 Myasthenia gravis without (acute) exacerbation: Secondary | ICD-10-CM | POA: Diagnosis not present

## 2017-08-09 DIAGNOSIS — J181 Lobar pneumonia, unspecified organism: Secondary | ICD-10-CM | POA: Diagnosis not present

## 2017-08-09 DIAGNOSIS — S51011D Laceration without foreign body of right elbow, subsequent encounter: Secondary | ICD-10-CM | POA: Diagnosis not present

## 2017-08-09 DIAGNOSIS — M150 Primary generalized (osteo)arthritis: Secondary | ICD-10-CM | POA: Diagnosis not present

## 2017-08-09 DIAGNOSIS — L02416 Cutaneous abscess of left lower limb: Secondary | ICD-10-CM | POA: Diagnosis not present

## 2017-08-09 DIAGNOSIS — J440 Chronic obstructive pulmonary disease with acute lower respiratory infection: Secondary | ICD-10-CM | POA: Diagnosis not present

## 2017-08-11 DIAGNOSIS — S51011D Laceration without foreign body of right elbow, subsequent encounter: Secondary | ICD-10-CM | POA: Diagnosis not present

## 2017-08-11 DIAGNOSIS — J440 Chronic obstructive pulmonary disease with acute lower respiratory infection: Secondary | ICD-10-CM | POA: Diagnosis not present

## 2017-08-11 DIAGNOSIS — G7000 Myasthenia gravis without (acute) exacerbation: Secondary | ICD-10-CM | POA: Diagnosis not present

## 2017-08-11 DIAGNOSIS — L02416 Cutaneous abscess of left lower limb: Secondary | ICD-10-CM | POA: Diagnosis not present

## 2017-08-11 DIAGNOSIS — M150 Primary generalized (osteo)arthritis: Secondary | ICD-10-CM | POA: Diagnosis not present

## 2017-08-11 DIAGNOSIS — J181 Lobar pneumonia, unspecified organism: Secondary | ICD-10-CM | POA: Diagnosis not present

## 2017-08-14 DIAGNOSIS — J181 Lobar pneumonia, unspecified organism: Secondary | ICD-10-CM | POA: Diagnosis not present

## 2017-08-14 DIAGNOSIS — L02416 Cutaneous abscess of left lower limb: Secondary | ICD-10-CM | POA: Diagnosis not present

## 2017-08-14 DIAGNOSIS — S51011D Laceration without foreign body of right elbow, subsequent encounter: Secondary | ICD-10-CM | POA: Diagnosis not present

## 2017-08-14 DIAGNOSIS — M150 Primary generalized (osteo)arthritis: Secondary | ICD-10-CM | POA: Diagnosis not present

## 2017-08-14 DIAGNOSIS — G7000 Myasthenia gravis without (acute) exacerbation: Secondary | ICD-10-CM | POA: Diagnosis not present

## 2017-08-14 DIAGNOSIS — J440 Chronic obstructive pulmonary disease with acute lower respiratory infection: Secondary | ICD-10-CM | POA: Diagnosis not present

## 2017-08-15 ENCOUNTER — Other Ambulatory Visit: Payer: Self-pay

## 2017-08-15 ENCOUNTER — Encounter (HOSPITAL_COMMUNITY): Payer: Self-pay

## 2017-08-15 ENCOUNTER — Inpatient Hospital Stay (HOSPITAL_COMMUNITY)
Admission: EM | Admit: 2017-08-15 | Discharge: 2017-08-25 | DRG: 177 | Disposition: A | Discharge status: Home Health | Payer: Medicare HMO | Attending: Internal Medicine | Admitting: Internal Medicine

## 2017-08-15 ENCOUNTER — Inpatient Hospital Stay (HOSPITAL_COMMUNITY): Payer: Medicare HMO

## 2017-08-15 ENCOUNTER — Emergency Department (HOSPITAL_COMMUNITY): Payer: Medicare HMO

## 2017-08-15 DIAGNOSIS — R0902 Hypoxemia: Secondary | ICD-10-CM | POA: Diagnosis not present

## 2017-08-15 DIAGNOSIS — I110 Hypertensive heart disease with heart failure: Secondary | ICD-10-CM | POA: Diagnosis present

## 2017-08-15 DIAGNOSIS — Z66 Do not resuscitate: Secondary | ICD-10-CM | POA: Diagnosis present

## 2017-08-15 DIAGNOSIS — R0603 Acute respiratory distress: Secondary | ICD-10-CM | POA: Diagnosis not present

## 2017-08-15 DIAGNOSIS — L89151 Pressure ulcer of sacral region, stage 1: Secondary | ICD-10-CM | POA: Diagnosis not present

## 2017-08-15 DIAGNOSIS — E039 Hypothyroidism, unspecified: Secondary | ICD-10-CM | POA: Diagnosis not present

## 2017-08-15 DIAGNOSIS — H409 Unspecified glaucoma: Secondary | ICD-10-CM | POA: Diagnosis present

## 2017-08-15 DIAGNOSIS — J9621 Acute and chronic respiratory failure with hypoxia: Secondary | ICD-10-CM | POA: Diagnosis present

## 2017-08-15 DIAGNOSIS — R1312 Dysphagia, oropharyngeal phase: Secondary | ICD-10-CM | POA: Diagnosis present

## 2017-08-15 DIAGNOSIS — J9601 Acute respiratory failure with hypoxia: Secondary | ICD-10-CM | POA: Diagnosis not present

## 2017-08-15 DIAGNOSIS — I700 Atherosclerosis of aorta: Secondary | ICD-10-CM | POA: Diagnosis present

## 2017-08-15 DIAGNOSIS — R05 Cough: Secondary | ICD-10-CM | POA: Diagnosis not present

## 2017-08-15 DIAGNOSIS — E785 Hyperlipidemia, unspecified: Secondary | ICD-10-CM | POA: Diagnosis present

## 2017-08-15 DIAGNOSIS — D649 Anemia, unspecified: Secondary | ICD-10-CM | POA: Diagnosis present

## 2017-08-15 DIAGNOSIS — J449 Chronic obstructive pulmonary disease, unspecified: Secondary | ICD-10-CM | POA: Diagnosis not present

## 2017-08-15 DIAGNOSIS — Z7902 Long term (current) use of antithrombotics/antiplatelets: Secondary | ICD-10-CM

## 2017-08-15 DIAGNOSIS — Z8249 Family history of ischemic heart disease and other diseases of the circulatory system: Secondary | ICD-10-CM

## 2017-08-15 DIAGNOSIS — J189 Pneumonia, unspecified organism: Secondary | ICD-10-CM | POA: Diagnosis not present

## 2017-08-15 DIAGNOSIS — T17900A Unspecified foreign body in respiratory tract, part unspecified causing asphyxiation, initial encounter: Secondary | ICD-10-CM | POA: Diagnosis not present

## 2017-08-15 DIAGNOSIS — Z7951 Long term (current) use of inhaled steroids: Secondary | ICD-10-CM

## 2017-08-15 DIAGNOSIS — G9520 Unspecified cord compression: Secondary | ICD-10-CM | POA: Diagnosis not present

## 2017-08-15 DIAGNOSIS — I272 Pulmonary hypertension, unspecified: Secondary | ICD-10-CM | POA: Diagnosis present

## 2017-08-15 DIAGNOSIS — I5023 Acute on chronic systolic (congestive) heart failure: Secondary | ICD-10-CM | POA: Diagnosis present

## 2017-08-15 DIAGNOSIS — J441 Chronic obstructive pulmonary disease with (acute) exacerbation: Secondary | ICD-10-CM | POA: Diagnosis not present

## 2017-08-15 DIAGNOSIS — J690 Pneumonitis due to inhalation of food and vomit: Principal | ICD-10-CM | POA: Diagnosis present

## 2017-08-15 DIAGNOSIS — I482 Chronic atrial fibrillation: Secondary | ICD-10-CM | POA: Diagnosis present

## 2017-08-15 DIAGNOSIS — F419 Anxiety disorder, unspecified: Secondary | ICD-10-CM | POA: Diagnosis present

## 2017-08-15 DIAGNOSIS — I4891 Unspecified atrial fibrillation: Secondary | ICD-10-CM | POA: Diagnosis not present

## 2017-08-15 DIAGNOSIS — J181 Lobar pneumonia, unspecified organism: Secondary | ICD-10-CM | POA: Diagnosis not present

## 2017-08-15 DIAGNOSIS — R17 Unspecified jaundice: Secondary | ICD-10-CM | POA: Diagnosis not present

## 2017-08-15 DIAGNOSIS — Z7982 Long term (current) use of aspirin: Secondary | ICD-10-CM

## 2017-08-15 DIAGNOSIS — Z7952 Long term (current) use of systemic steroids: Secondary | ICD-10-CM | POA: Diagnosis not present

## 2017-08-15 DIAGNOSIS — R0602 Shortness of breath: Secondary | ICD-10-CM | POA: Diagnosis not present

## 2017-08-15 DIAGNOSIS — Z9049 Acquired absence of other specified parts of digestive tract: Secondary | ICD-10-CM | POA: Diagnosis not present

## 2017-08-15 DIAGNOSIS — M2550 Pain in unspecified joint: Secondary | ICD-10-CM | POA: Diagnosis not present

## 2017-08-15 DIAGNOSIS — I499 Cardiac arrhythmia, unspecified: Secondary | ICD-10-CM | POA: Diagnosis not present

## 2017-08-15 DIAGNOSIS — I081 Rheumatic disorders of both mitral and tricuspid valves: Secondary | ICD-10-CM | POA: Diagnosis present

## 2017-08-15 DIAGNOSIS — Z7189 Other specified counseling: Secondary | ICD-10-CM | POA: Diagnosis not present

## 2017-08-15 DIAGNOSIS — Z7401 Bed confinement status: Secondary | ICD-10-CM | POA: Diagnosis not present

## 2017-08-15 DIAGNOSIS — F329 Major depressive disorder, single episode, unspecified: Secondary | ICD-10-CM | POA: Diagnosis present

## 2017-08-15 DIAGNOSIS — M159 Polyosteoarthritis, unspecified: Secondary | ICD-10-CM | POA: Diagnosis present

## 2017-08-15 DIAGNOSIS — E876 Hypokalemia: Secondary | ICD-10-CM

## 2017-08-15 DIAGNOSIS — R0600 Dyspnea, unspecified: Secondary | ICD-10-CM | POA: Diagnosis not present

## 2017-08-15 DIAGNOSIS — K219 Gastro-esophageal reflux disease without esophagitis: Secondary | ICD-10-CM | POA: Diagnosis not present

## 2017-08-15 DIAGNOSIS — J40 Bronchitis, not specified as acute or chronic: Secondary | ICD-10-CM | POA: Diagnosis not present

## 2017-08-15 DIAGNOSIS — I361 Nonrheumatic tricuspid (valve) insufficiency: Secondary | ICD-10-CM | POA: Diagnosis not present

## 2017-08-15 DIAGNOSIS — J9811 Atelectasis: Secondary | ICD-10-CM | POA: Diagnosis not present

## 2017-08-15 DIAGNOSIS — I10 Essential (primary) hypertension: Secondary | ICD-10-CM | POA: Diagnosis not present

## 2017-08-15 DIAGNOSIS — Z96653 Presence of artificial knee joint, bilateral: Secondary | ICD-10-CM | POA: Diagnosis present

## 2017-08-15 DIAGNOSIS — Z8261 Family history of arthritis: Secondary | ICD-10-CM

## 2017-08-15 DIAGNOSIS — D696 Thrombocytopenia, unspecified: Secondary | ICD-10-CM | POA: Diagnosis present

## 2017-08-15 DIAGNOSIS — G7000 Myasthenia gravis without (acute) exacerbation: Secondary | ICD-10-CM | POA: Diagnosis not present

## 2017-08-15 DIAGNOSIS — Z515 Encounter for palliative care: Secondary | ICD-10-CM | POA: Diagnosis not present

## 2017-08-15 DIAGNOSIS — I340 Nonrheumatic mitral (valve) insufficiency: Secondary | ICD-10-CM | POA: Diagnosis not present

## 2017-08-15 DIAGNOSIS — R069 Unspecified abnormalities of breathing: Secondary | ICD-10-CM | POA: Diagnosis not present

## 2017-08-15 HISTORY — DX: Unspecified cord compression: G95.20

## 2017-08-15 LAB — BASIC METABOLIC PANEL
Anion gap: 9 (ref 5–15)
BUN: 16 mg/dL (ref 6–20)
CALCIUM: 8.2 mg/dL — AB (ref 8.9–10.3)
CHLORIDE: 103 mmol/L (ref 101–111)
CO2: 28 mmol/L (ref 22–32)
Creatinine, Ser: 0.7 mg/dL (ref 0.61–1.24)
Glucose, Bld: 120 mg/dL — ABNORMAL HIGH (ref 65–99)
Potassium: 2.8 mmol/L — ABNORMAL LOW (ref 3.5–5.1)
SODIUM: 140 mmol/L (ref 135–145)

## 2017-08-15 LAB — GLUCOSE, CAPILLARY
GLUCOSE-CAPILLARY: 245 mg/dL — AB (ref 65–99)
Glucose-Capillary: 212 mg/dL — ABNORMAL HIGH (ref 65–99)

## 2017-08-15 LAB — CBC
HCT: 33.4 % — ABNORMAL LOW (ref 39.0–52.0)
Hemoglobin: 10.1 g/dL — ABNORMAL LOW (ref 13.0–17.0)
MCH: 27 pg (ref 26.0–34.0)
MCHC: 30.2 g/dL (ref 30.0–36.0)
MCV: 89.3 fL (ref 78.0–100.0)
PLATELETS: 176 10*3/uL (ref 150–400)
RBC: 3.74 MIL/uL — AB (ref 4.22–5.81)
RDW: 16.8 % — ABNORMAL HIGH (ref 11.5–15.5)
WBC: 10.6 10*3/uL — AB (ref 4.0–10.5)

## 2017-08-15 LAB — HEPATIC FUNCTION PANEL
ALBUMIN: 2.8 g/dL — AB (ref 3.5–5.0)
ALT: 12 U/L — ABNORMAL LOW (ref 17–63)
AST: 22 U/L (ref 15–41)
Alkaline Phosphatase: 111 U/L (ref 38–126)
Bilirubin, Direct: 0.7 mg/dL — ABNORMAL HIGH (ref 0.1–0.5)
Indirect Bilirubin: 0.8 mg/dL (ref 0.3–0.9)
TOTAL PROTEIN: 6.2 g/dL — AB (ref 6.5–8.1)
Total Bilirubin: 1.5 mg/dL — ABNORMAL HIGH (ref 0.3–1.2)

## 2017-08-15 LAB — TSH: TSH: 1 u[IU]/mL (ref 0.350–4.500)

## 2017-08-15 LAB — SEDIMENTATION RATE: SED RATE: 28 mm/h — AB (ref 0–16)

## 2017-08-15 LAB — STREP PNEUMONIAE URINARY ANTIGEN: STREP PNEUMO URINARY ANTIGEN: NEGATIVE

## 2017-08-15 LAB — PROCALCITONIN

## 2017-08-15 LAB — BRAIN NATRIURETIC PEPTIDE: B NATRIURETIC PEPTIDE 5: 338.7 pg/mL — AB (ref 0.0–100.0)

## 2017-08-15 MED ORDER — VANCOMYCIN HCL IN DEXTROSE 1-5 GM/200ML-% IV SOLN
1000.0000 mg | Freq: Two times a day (BID) | INTRAVENOUS | Status: DC
  Administered 2017-08-16 – 2017-08-17 (×2): 1000 mg via INTRAVENOUS
  Filled 2017-08-15 (×4): qty 200

## 2017-08-15 MED ORDER — SODIUM CHLORIDE 0.9 % IV BOLUS
500.0000 mL | Freq: Once | INTRAVENOUS | Status: AC
  Administered 2017-08-15: 500 mL via INTRAVENOUS

## 2017-08-15 MED ORDER — IPRATROPIUM-ALBUTEROL 0.5-2.5 (3) MG/3ML IN SOLN
3.0000 mL | Freq: Four times a day (QID) | RESPIRATORY_TRACT | Status: DC
  Administered 2017-08-15 – 2017-08-19 (×15): 3 mL via RESPIRATORY_TRACT
  Filled 2017-08-15 (×18): qty 3

## 2017-08-15 MED ORDER — SODIUM CHLORIDE 0.9 % IV SOLN
1.0000 g | Freq: Once | INTRAVENOUS | Status: AC
  Administered 2017-08-15: 1 g via INTRAVENOUS
  Filled 2017-08-15: qty 10

## 2017-08-15 MED ORDER — ACETYLCYSTEINE 20 % IN SOLN
3.0000 mL | Freq: Three times a day (TID) | RESPIRATORY_TRACT | Status: DC
  Administered 2017-08-15: 3 mL via RESPIRATORY_TRACT
  Administered 2017-08-16: 21:00:00 via RESPIRATORY_TRACT
  Administered 2017-08-16 (×2): 4 mL via RESPIRATORY_TRACT
  Administered 2017-08-17 (×2): 3 mL via RESPIRATORY_TRACT
  Administered 2017-08-17: 21:00:00 via RESPIRATORY_TRACT
  Administered 2017-08-18: 3 mL via RESPIRATORY_TRACT
  Filled 2017-08-15 (×9): qty 4

## 2017-08-15 MED ORDER — SODIUM CHLORIDE 0.9 % IV SOLN
2.0000 g | Freq: Two times a day (BID) | INTRAVENOUS | Status: AC
  Administered 2017-08-15 – 2017-08-22 (×14): 2 g via INTRAVENOUS
  Filled 2017-08-15 (×14): qty 2

## 2017-08-15 MED ORDER — LEVALBUTEROL HCL 0.63 MG/3ML IN NEBU: 0.6300 mg | INHALATION_SOLUTION | Freq: Three times a day (TID) | RESPIRATORY_TRACT | Status: DC | PRN

## 2017-08-15 MED ORDER — PANTOPRAZOLE SODIUM 40 MG PO TBEC
40.0000 mg | DELAYED_RELEASE_TABLET | Freq: Two times a day (BID) | ORAL | Status: DC
  Administered 2017-08-15 – 2017-08-25 (×20): 40 mg via ORAL
  Filled 2017-08-15 (×20): qty 1

## 2017-08-15 MED ORDER — LATANOPROST 0.005 % OP SOLN
1.0000 [drp] | Freq: Every day | OPHTHALMIC | Status: DC
  Administered 2017-08-15 – 2017-08-24 (×10): 1 [drp] via OPHTHALMIC
  Filled 2017-08-15: qty 2.5

## 2017-08-15 MED ORDER — DM-GUAIFENESIN ER 30-600 MG PO TB12
1.0000 | ORAL_TABLET | Freq: Two times a day (BID) | ORAL | Status: DC
  Filled 2017-08-15: qty 1

## 2017-08-15 MED ORDER — ALBUTEROL (5 MG/ML) CONTINUOUS INHALATION SOLN
10.0000 mg/h | INHALATION_SOLUTION | Freq: Once | RESPIRATORY_TRACT | Status: AC
Start: 2017-08-15 — End: 2017-08-15
  Administered 2017-08-15: 10 mg/h via RESPIRATORY_TRACT
  Filled 2017-08-15: qty 20

## 2017-08-15 MED ORDER — AZITHROMYCIN 500 MG IV SOLR
500.0000 mg | Freq: Once | INTRAVENOUS | Status: AC
  Administered 2017-08-15: 500 mg via INTRAVENOUS
  Filled 2017-08-15: qty 500

## 2017-08-15 MED ORDER — POTASSIUM CHLORIDE CRYS ER 20 MEQ PO TBCR
40.0000 meq | EXTENDED_RELEASE_TABLET | Freq: Once | ORAL | Status: AC
  Administered 2017-08-15: 40 meq via ORAL
  Filled 2017-08-15: qty 2

## 2017-08-15 MED ORDER — SODIUM CHLORIDE 0.9 % IV SOLN: 1.0000 g | INTRAVENOUS | Status: DC

## 2017-08-15 MED ORDER — AZATHIOPRINE 50 MG PO TABS
50.0000 mg | ORAL_TABLET | Freq: Two times a day (BID) | ORAL | Status: DC
  Administered 2017-08-15 – 2017-08-25 (×20): 50 mg via ORAL
  Filled 2017-08-15 (×21): qty 1

## 2017-08-15 MED ORDER — METHYLPREDNISOLONE SODIUM SUCC 125 MG IJ SOLR
125.0000 mg | Freq: Once | INTRAMUSCULAR | Status: AC
  Administered 2017-08-15: 125 mg via INTRAVENOUS
  Filled 2017-08-15: qty 2

## 2017-08-15 MED ORDER — SODIUM CHLORIDE 0.9 % IV SOLN
INTRAVENOUS | Status: DC
  Administered 2017-08-15 – 2017-08-16 (×3): via INTRAVENOUS

## 2017-08-15 MED ORDER — FUROSEMIDE 10 MG/ML IJ SOLN
60.0000 mg | Freq: Once | INTRAMUSCULAR | Status: AC
  Administered 2017-08-15: 60 mg via INTRAVENOUS
  Filled 2017-08-15: qty 6

## 2017-08-15 MED ORDER — OXYCODONE HCL ER 10 MG PO T12A
30.0000 mg | EXTENDED_RELEASE_TABLET | Freq: Two times a day (BID) | ORAL | Status: DC
  Administered 2017-08-15 – 2017-08-25 (×20): 30 mg via ORAL
  Filled 2017-08-15 (×20): qty 3

## 2017-08-15 MED ORDER — CALCIUM POLYCARBOPHIL 625 MG PO TABS
625.0000 mg | ORAL_TABLET | Freq: Every day | ORAL | Status: DC
  Filled 2017-08-15: qty 1

## 2017-08-15 MED ORDER — PREDNISONE 5 MG PO TABS
5.0000 mg | ORAL_TABLET | Freq: Every day | ORAL | Status: DC
  Administered 2017-08-16 – 2017-08-25 (×10): 5 mg via ORAL
  Filled 2017-08-15 (×10): qty 1

## 2017-08-15 MED ORDER — ALBUTEROL SULFATE (2.5 MG/3ML) 0.083% IN NEBU
5.0000 mg | INHALATION_SOLUTION | Freq: Once | RESPIRATORY_TRACT | Status: AC
  Administered 2017-08-15: 5 mg via RESPIRATORY_TRACT
  Filled 2017-08-15: qty 6

## 2017-08-15 MED ORDER — SERTRALINE HCL 50 MG PO TABS
150.0000 mg | ORAL_TABLET | Freq: Every day | ORAL | Status: DC
  Administered 2017-08-16 – 2017-08-25 (×10): 150 mg via ORAL
  Filled 2017-08-15 (×10): qty 1

## 2017-08-15 MED ORDER — PSYLLIUM 95 % PO PACK
1.0000 | PACK | Freq: Every day | ORAL | Status: DC
  Administered 2017-08-15 – 2017-08-21 (×7): 1 via ORAL
  Filled 2017-08-15 (×10): qty 1

## 2017-08-15 MED ORDER — SENNA 8.6 MG PO TABS
1.0000 | ORAL_TABLET | Freq: Every evening | ORAL | Status: DC
  Administered 2017-08-15 – 2017-08-24 (×10): 8.6 mg via ORAL
  Filled 2017-08-15 (×10): qty 1

## 2017-08-15 MED ORDER — FUROSEMIDE 10 MG/ML IJ SOLN
60.0000 mg | Freq: Two times a day (BID) | INTRAMUSCULAR | Status: AC
  Administered 2017-08-15 – 2017-08-16 (×2): 60 mg via INTRAVENOUS
  Filled 2017-08-15 (×2): qty 6

## 2017-08-15 MED ORDER — ACETAMINOPHEN 325 MG PO TABS: 650.0000 mg | ORAL_TABLET | Freq: Four times a day (QID) | ORAL | Status: DC | PRN

## 2017-08-15 MED ORDER — DOCUSATE SODIUM 100 MG PO CAPS
100.0000 mg | ORAL_CAPSULE | Freq: Two times a day (BID) | ORAL | Status: DC
  Administered 2017-08-15 – 2017-08-25 (×20): 100 mg via ORAL
  Filled 2017-08-15 (×20): qty 1

## 2017-08-15 MED ORDER — SODIUM CHLORIDE 0.9% FLUSH
3.0000 mL | Freq: Two times a day (BID) | INTRAVENOUS | Status: DC
  Administered 2017-08-15 – 2017-08-25 (×18): 3 mL via INTRAVENOUS

## 2017-08-15 MED ORDER — GUAIFENESIN 100 MG/5ML PO SOLN
5.0000 mL | Freq: Once | ORAL | Status: AC
  Administered 2017-08-15: 100 mg via ORAL
  Filled 2017-08-15: qty 5

## 2017-08-15 MED ORDER — METOPROLOL SUCCINATE ER 50 MG PO TB24
50.0000 mg | ORAL_TABLET | Freq: Every day | ORAL | Status: DC
  Administered 2017-08-16: 50 mg via ORAL
  Filled 2017-08-15: qty 1

## 2017-08-15 MED ORDER — LEVOTHYROXINE SODIUM 200 MCG PO TABS
200.0000 ug | ORAL_TABLET | Freq: Every day | ORAL | Status: DC
  Administered 2017-08-16 – 2017-08-25 (×10): 200 ug via ORAL
  Filled 2017-08-15: qty 1
  Filled 2017-08-15 (×3): qty 2
  Filled 2017-08-15 (×2): qty 1
  Filled 2017-08-15: qty 2
  Filled 2017-08-15 (×2): qty 1
  Filled 2017-08-15 (×2): qty 2
  Filled 2017-08-15: qty 1
  Filled 2017-08-15 (×3): qty 2
  Filled 2017-08-15 (×4): qty 1

## 2017-08-15 MED ORDER — APIXABAN 5 MG PO TABS
5.0000 mg | ORAL_TABLET | Freq: Two times a day (BID) | ORAL | Status: DC
  Administered 2017-08-15 – 2017-08-25 (×20): 5 mg via ORAL
  Filled 2017-08-15 (×20): qty 1

## 2017-08-15 MED ORDER — ATORVASTATIN CALCIUM 10 MG PO TABS
10.0000 mg | ORAL_TABLET | Freq: Every evening | ORAL | Status: DC
  Administered 2017-08-15 – 2017-08-24 (×10): 10 mg via ORAL
  Filled 2017-08-15 (×10): qty 1

## 2017-08-15 MED ORDER — IPRATROPIUM-ALBUTEROL 0.5-2.5 (3) MG/3ML IN SOLN: 3.0000 mL | Freq: Two times a day (BID) | RESPIRATORY_TRACT | Status: DC

## 2017-08-15 MED ORDER — VANCOMYCIN HCL 10 G IV SOLR
1500.0000 mg | Freq: Once | INTRAVENOUS | Status: AC
  Administered 2017-08-16: 1500 mg via INTRAVENOUS
  Filled 2017-08-15: qty 1500

## 2017-08-15 MED ORDER — ONDANSETRON HCL 4 MG/2ML IJ SOLN
4.0000 mg | Freq: Four times a day (QID) | INTRAMUSCULAR | Status: DC | PRN
  Administered 2017-08-22: 4 mg via INTRAVENOUS
  Filled 2017-08-15: qty 2

## 2017-08-15 MED ORDER — ASPIRIN EC 81 MG PO TBEC
81.0000 mg | DELAYED_RELEASE_TABLET | Freq: Every day | ORAL | Status: DC
  Administered 2017-08-16 – 2017-08-25 (×10): 81 mg via ORAL
  Filled 2017-08-15 (×10): qty 1

## 2017-08-15 MED ORDER — ONDANSETRON HCL 4 MG PO TABS: 4.0000 mg | ORAL_TABLET | Freq: Four times a day (QID) | ORAL | Status: DC | PRN

## 2017-08-15 MED ORDER — LOSARTAN POTASSIUM 25 MG PO TABS: 12.5000 mg | ORAL_TABLET | Freq: Every day | ORAL | Status: DC

## 2017-08-15 MED ORDER — ACETAMINOPHEN 650 MG RE SUPP: 650.0000 mg | Freq: Four times a day (QID) | RECTAL | Status: DC | PRN

## 2017-08-15 MED ORDER — FLUTICASONE PROPIONATE 50 MCG/ACT NA SUSP
1.0000 | Freq: Every day | NASAL | Status: DC
  Administered 2017-08-16 – 2017-08-25 (×10): 1 via NASAL
  Filled 2017-08-15: qty 16

## 2017-08-15 MED ORDER — ENOXAPARIN SODIUM 40 MG/0.4ML ~~LOC~~ SOLN
40.0000 mg | SUBCUTANEOUS | Status: DC
  Administered 2017-08-15: 40 mg via SUBCUTANEOUS
  Filled 2017-08-15: qty 0.4

## 2017-08-15 MED ORDER — POTASSIUM CHLORIDE 20 MEQ/15ML (10%) PO SOLN
20.0000 meq | Freq: Two times a day (BID) | ORAL | Status: DC
  Administered 2017-08-15 – 2017-08-18 (×7): 20 meq via ORAL
  Filled 2017-08-15 (×7): qty 15

## 2017-08-15 MED ORDER — SODIUM CHLORIDE 0.9 % IV SOLN: 500.0000 mg | INTRAVENOUS | Status: DC

## 2017-08-15 MED ORDER — CLOPIDOGREL BISULFATE 75 MG PO TABS: 75.0000 mg | ORAL_TABLET | ORAL | Status: DC

## 2017-08-15 MED ORDER — MOMETASONE FURO-FORMOTEROL FUM 100-5 MCG/ACT IN AERO
2.0000 | INHALATION_SPRAY | Freq: Two times a day (BID) | RESPIRATORY_TRACT | Status: DC
  Administered 2017-08-15 – 2017-08-24 (×18): 2 via RESPIRATORY_TRACT
  Filled 2017-08-15: qty 8.8

## 2017-08-15 MED ORDER — TEMAZEPAM 15 MG PO CAPS
30.0000 mg | ORAL_CAPSULE | Freq: Every day | ORAL | Status: DC
  Administered 2017-08-15 – 2017-08-24 (×10): 30 mg via ORAL
  Filled 2017-08-15 (×10): qty 2

## 2017-08-15 NOTE — Progress Notes (Signed)
Pharmacy Note  Spoke with patient and daughters re: Plavix 75 mg PO twice weekly for stroke prevention secondary to Afib. Explained Plavix is not the best option and especially at the dose he is taking. He was on warfarin in the past but was switched to Plavix due to difficulty managing INR. I asked if he would be willing to start apixaban and both he and his daughters are in agreement to do so. I contacted Erin Hearing and she gave me orders to stop Plavix and begin apixaban.  Renold Genta, PharmD, BCPS Clinical Pharmacist Clinical phone for 08/15/2017 until 10p is [phone] Please check AMION for all [place] numbers 08/15/2017 6:24 PM

## 2017-08-15 NOTE — ED Provider Notes (Signed)
[facility] EMERGENCY DEPARTMENT Provider Note   CSN: [id number] Arrival date & time: 08/15/17  1143     History   Chief Complaint Chief Complaint  Patient presents with  . Shortness of Breath    HPI Travis Ross is a 84 y.o. male.  HPI   Patient presents for evaluation of recurrent shortness of breath with concern for pneumonia.  His daughter reports he has had this about every month for the last several months.  He finished his last dose of Levaquin, 4 days ago.  He has a sensation that he cannot cough mucus up from his chest.  He has not had any recent fever, nausea, vomiting, focal weakness or paresthesia.  He is taking his usual medications, without relief.  His daughter reports he has chronic lung disease and the function has decreased from "74 to 11."  He does not have a local pulmonologist.  He lives in an assisted living facility.  There are no other known modifying factors. Past Medical History:  Diagnosis Date  . A-fib ([place])   . CAP (community acquired pneumonia) 02/04/2017  . COPD with chronic bronchitis and emphysema ([place])   . Essential hypertension   . Femur fracture, right ([place])    Related to a fall. Periprosthetic distal femur fracture (close to the right knee prosthesis).  > Initial plans were to treat with brace, however, has now progressed and may require surgery  . GERD (gastroesophageal reflux disease)   . Glaucoma   . Hyperlipidemia   . Hypothyroidism (acquired)    On Levothyroxine  . Myasthenia gravis ([place])    Now (prior to recent femur fracture) was relegated to maneuvering himself around on a Rolling Walker (Rollator) using his feet & sitting on the walker.  Does not have arm strength to mover a wheelchair or transfer.;   . Osteoarthritis    Global osteoarthritis involving back, hips and knees as well as elbows.  . Paresthesia    bilateral upper extremities, very weak ; poor grip  (must use adaptable silverware );   . Permanent  atrial fibrillation (HCC)    Long-standing (> 4 yrs). Initially evaluated with Stress Test & Echo - Pt reports that these studies were "normal"    Patient Active Problem List   Diagnosis Date Noted  . Acute respiratory failure with hypoxia ([place]) 08/15/2017  . Pressure injury of sacral region, stage 2 02/06/2017  . Acute on chronic systolic (congestive) heart failure ([place]) 02/06/2017  . Community acquired pneumonia 02/04/2017  . A-fib ([place])   . Periprosthetic fracture around internal prosthetic right knee joint 12/15/2016  . Failed total knee arthroplasty ([place]) 12/15/2016  . Permanent atrial fibrillation ([place]) 12/05/2016  . Pre-operative cardiovascular examination 12/05/2016  . Venous stasis of both lower extremities 12/05/2016  . Essential hypertension     Past Surgical History:  Procedure Laterality Date  . BACK SURGERY     x 6  . CARPAL TUNNEL RELEASE    . CHOLECYSTECTOMY    . ELBOW SURGERY  08/2015   Total of 3 surgeries  . REPLACEMENT TOTAL KNEE Bilateral    Right TKA 2003, left TKA 2010    . TOTAL KNEE REVISION Right 12/15/2016   Procedure: RIGHT TOTAL KNEE REVISION;  Surgeon: Gaynelle Arabian, MD;  Location: [facility];  Service: Orthopedics;  Laterality: Right;        Home Medications    Prior to Admission medications   Medication Sig Start Date End Date Taking? Authorizing Provider  acetaminophen (TYLENOL) 325 MG tablet Take 650 mg by mouth every 6 (six) hours as needed for moderate pain.   Yes [provider]  aspirin EC 81 MG tablet Take 81 mg by mouth daily.   Yes [provider]  atorvastatin (LIPITOR) 10 MG tablet Take 10 mg by mouth every evening.  10/28/16  Yes [provider]  azaTHIOprine (IMURAN) 50 MG tablet Take 50 mg by mouth 2 (two) times daily.   Yes [provider]  benzonatate (TESSALON) 100 MG capsule Take 100 mg by mouth at bedtime.   Yes [provider]  Brinzolamide-Brimonidine (SIMBRINZA) 1-0.2 % SUSP  Place 1 drop into both eyes 2 (two) times daily.   Yes [provider]  clopidogrel (PLAVIX) 75 MG tablet Take 75 mg by mouth 2 (two) times a week.  10/28/16  Yes [provider]  docusate sodium (COLACE) 100 MG capsule Take 100 mg by mouth 2 (two) times daily.   Yes [provider]  DULERA 100-5 MCG/ACT AERO Take 2 puffs by mouth 2 (two) times daily.  10/10/16  Yes [provider]  fluticasone (FLONASE) 50 MCG/ACT nasal spray Place 1 spray into both nostrils daily.   Yes [provider]  furosemide (LASIX) 40 MG tablet Take 40 mg by mouth daily. 11/09/16  Yes [provider]  guaiFENesin (MUCINEX) 600 MG 12 hr tablet Take 600 mg by mouth 2 (two) times daily.   Yes [provider]  Ipratropium-Albuterol (COMBIVENT RESPIMAT) 20-100 MCG/ACT AERS respimat Inhale 2 puffs into the lungs 2 (two) times daily.    Yes [provider]  levothyroxine (SYNTHROID, LEVOTHROID) 200 MCG tablet Take 200 mcg by mouth daily.  10/31/16  Yes [provider]  losartan (COZAAR) 25 MG tablet Take 0.5 tablets (12.5 mg total) by mouth daily. Patient taking differently: Take 50 mg by mouth daily.  02/08/17  Yes Katherine Roan, MD  magnesium hydroxide (MILK OF MAGNESIA) 400 MG/5ML suspension Take 30 mLs by mouth See admin instructions. every 24 hours as needed for constipation.   Yes [provider]  metoprolol succinate (TOPROL-XL) 50 MG 24 hr tablet Take 50 mg by mouth daily. 10/28/16  Yes [provider]  oxyCODONE (OXYCONTIN) 30 MG 12 hr tablet Take 1 tablet (30 mg total) by mouth every 12 (twelve) hours. 12/20/16  Yes Perkins, Alexzandrew L, PA-C  pantoprazole (PROTONIX) 40 MG tablet Take 40 mg by mouth 2 (two) times daily.  10/28/16  Yes [provider]  polycarbophil (FIBERCON) 625 MG tablet Take 1 tablet (625 mg total) by mouth at bedtime. 12/20/16  Yes Perkins, Alexzandrew L, PA-C  potassium chloride 20 MEQ/15ML  (10%) SOLN Take 7.5 mLs by mouth daily. 08/05/17  Yes [provider]  predniSONE (DELTASONE) 10 MG tablet Take 1 tablet (10 mg total) by mouth daily with breakfast. Patient taking differently: Take 5 mg by mouth daily with breakfast.  02/07/17  Yes Winfrey, Jenne Pane, MD  Psyllium (FIBER) 0.52 g CAPS Take 0.52 g by mouth at bedtime.   Yes [provider]  senna (SENOKOT) 8.6 MG tablet Take 1 tablet by mouth every evening.   Yes [provider]  sertraline (ZOLOFT) 100 MG tablet Take 150 mg by mouth daily.  10/28/16  Yes [provider]  temazepam (RESTORIL) 30 MG capsule Take 30 mg by mouth at bedtime.  11/09/16  Yes [provider]  TRAVATAN Z 0.004 % SOLN ophthalmic solution Place 1 drop into both eyes  at bedtime.  11/09/16  Yes [provider]    Family History Family History  Problem Relation Age of Onset  . Other Mother        Polio  . Heart failure Father   . Arthritis Sister   . Arthritis Brother     Social History Social History   Tobacco Use  . Smoking status: Never Smoker  . Smokeless tobacco: Never Used  Substance Use Topics  . Alcohol use: No  . Drug use: No     Allergies   Patient has no known allergies.   Review of Systems Review of Systems  All other systems reviewed and are negative.    Physical Exam Updated Vital Signs BP 111/62   Pulse (!) 135   Temp 98.6 F (37 C) (Oral)   Resp 17   Ht 6\' 1"  (1.854 m)   Wt 82.6 kg (182 lb)   SpO2 94%   BMI 24.01 kg/m   Physical Exam  Constitutional: He is oriented to person, place, and time. He appears well-developed. He does not appear ill.  Elderly, frail  HENT:  Head: Normocephalic and atraumatic.  Right Ear: External ear normal.  Left Ear: External ear normal.  Eyes: Pupils are equal, round, and reactive to light. Conjunctivae and EOM are normal.  Neck: Normal range of motion and phonation normal. Neck supple.  Cardiovascular: Normal rate, regular  rhythm and normal heart sounds.  Pulmonary/Chest: Effort normal. No accessory muscle usage. No respiratory distress. He has decreased breath sounds (Generalized). He has wheezes (Few scattered). He has rhonchi (Scattered). He has no rales. He exhibits no bony tenderness.  Abdominal: Soft. There is no tenderness.  Musculoskeletal: Normal range of motion.       Right lower leg: He exhibits edema.       Left lower leg: He exhibits edema.  2+ lower extremity edema bilaterally.  Wound left anterior shin, is dressed.  Neurological: He is alert and oriented to person, place, and time. No cranial nerve deficit or sensory deficit. He exhibits normal muscle tone. Coordination normal.  Skin: Skin is warm, dry and intact.  Psychiatric: He has a normal mood and affect. His behavior is normal. Judgment and thought content normal.  Nursing note and vitals reviewed.    ED Treatments / Results  Labs (all labs ordered are listed, but only abnormal results are displayed) Labs Reviewed  BASIC METABOLIC PANEL - Abnormal; Notable for the following components:      Result Value   Potassium 2.8 (*)    Glucose, Bld 120 (*)    Calcium 8.2 (*)    All other components within normal limits  CBC - Abnormal; Notable for the following components:   WBC 10.6 (*)    RBC 3.74 (*)    Hemoglobin 10.1 (*)    HCT 33.4 (*)    RDW 16.8 (*)    All other components within normal limits  BRAIN NATRIURETIC PEPTIDE - Abnormal; Notable for the following components:   B Natriuretic Peptide 338.7 (*)    All other components within normal limits  HEPATIC FUNCTION PANEL - Abnormal; Notable for the following components:   Total Protein 6.2 (*)    Albumin 2.8 (*)    ALT 12 (*)    Total Bilirubin 1.5 (*)    Bilirubin, Direct 0.7 (*)    All other components within normal limits  RESPIRATORY PANEL BY PCR  CULTURE, BLOOD (ROUTINE X 2)  CULTURE, BLOOD (ROUTINE X 2)  CULTURE, EXPECTORATED SPUTUM-ASSESSMENT  GRAM STAIN    PROCALCITONIN  SEDIMENTATION RATE  HIV ANTIBODY (ROUTINE TESTING)  STREP PNEUMONIAE URINARY ANTIGEN    EKG EKG Interpretation  Date/Time:  Tuesday August 15 2017 11:52:38 EDT Ventricular Rate:  94 PR Interval:    QRS Duration: 100 QT Interval:  392 QTC Calculation: 491 R Axis:   -24 Text Interpretation:  Atrial fibrillation Ventricular premature complex Borderline left axis deviation Borderline low voltage, extremity leads Consider anterior infarct Since last tracing rate slower Confirmed by Daleen Bo [phone]) on 08/15/2017 12:28:15 PM   Radiology Dg Chest 2 View  Result Date: 08/15/2017 CLINICAL DATA:  Short of breath, wheezing, cough for a week, history of recent pneumonia EXAM: CHEST - 2 VIEW COMPARISON:  CT chest of 02/04/2017 and chest x-ray of the same day FINDINGS: The lungs are not as well aerated but the right basilar pneumonia has cleared. However, as a result of the poor inspiration, is difficult to exclude pneumonia remaining at the left lung base in the right mid upper lung. A tiny effusion may be present blunting the costophrenic angles on the lateral view. Cardiomegaly is stable. No bony abnormality is seen. There are degenerative changes both shoulders. IMPRESSION: 1. Poor inspiration but clearing of the right basilar pneumonia. Otherwise patchy pneumonia in the right mid lung and left lung base cannot be excluded. 2. Stable cardiomegaly. Electronically Signed   By: Ivar Drape M.D.   On: 08/15/2017 13:40    Procedures .Critical Care Performed by: Daleen Bo, MD Authorized by: Daleen Bo, MD   Critical care provider statement:    Critical care time (minutes):  35   Critical care start time:  08/15/2017 1:11 PM   Critical care end time:  08/15/2017 3:43 PM   Critical care time was exclusive of:  Separately billable procedures and treating other patients   Critical care was necessary to treat or prevent imminent or life-threatening deterioration of the  following conditions:  Respiratory failure   Critical care was time spent personally by me on the following activities:  Blood draw for specimens, development of treatment plan with patient or surrogate, discussions with consultants, evaluation of patient's response to treatment, examination of patient, obtaining history from patient or surrogate, ordering and performing treatments and interventions, ordering and review of laboratory studies, pulse oximetry, re-evaluation of patient's condition, review of old charts and ordering and review of radiographic studies   (including critical care time)  Medications Ordered in ED Medications  0.9 %  sodium chloride infusion ( Intravenous New Bag/Given 08/15/17 1402)  sodium chloride 0.9 % bolus 500 mL (has no administration in time range)  cefTRIAXone (ROCEPHIN) 1 g in sodium chloride 0.9 % 100 mL IVPB (has no administration in time range)  azithromycin (ZITHROMAX) 500 mg in sodium chloride 0.9 % 250 mL IVPB (has no administration in time range)  albuterol (PROVENTIL) (2.5 MG/3ML) 0.083% nebulizer solution 5 mg (5 mg Nebulization Given 08/15/17 1234)  albuterol (PROVENTIL,VENTOLIN) solution continuous neb (10 mg/hr Nebulization Given 08/15/17 1404)  methylPREDNISolone sodium succinate (SOLU-MEDROL) 125 mg/2 mL injection 125 mg (125 mg Intravenous Given 08/15/17 1348)  guaiFENesin (ROBITUSSIN) 100 MG/5ML solution 100 mg (100 mg Oral Given 08/15/17 1450)  potassium chloride SA (K-DUR,KLOR-CON) CR tablet 40 mEq (40 mEq Oral Given 08/15/17 1506)     Initial Impression / Assessment and Plan / ED Course  I have reviewed the triage vital signs and the nursing notes.  Pertinent labs & imaging results that were available during  my care of the patient were reviewed by me and considered in my medical decision making (see chart for details).  Clinical Course as of Aug 16 1599  Tue Aug 15, 2017  1522 Mild elevation  Brain natriuretic peptide(!) [EW]  1522 Normal  except white count low, hemoglobin low  CBC(!) [EW]  1523 Hepatic function panel(!) [EW]  1523 Normal except potassium low, glucose high  Basic metabolic panel(!) [EW]  2536 Potential multifocal pneumonia, unable to ascertain if new versus old.  Images reviewed  DG Chest 2 View [EW]  1525 Normal except elevated total protein, low albumin, low ALT, elevated total bilirubin, elevated direct bilirubin  Hepatic function panel(!) [EW]    Clinical Course User Index [EW] Daleen Bo, MD     Patient Vitals for the past 24 hrs:  BP Temp Temp src Pulse Resp SpO2 Height Weight  08/15/17 1545 111/62 - - (!) 135 17 94 % - -  08/15/17 1430 (!) 143/66 - - (!) 129 19 99 % - -  08/15/17 1415 132/74 - - 68 19 97 % - -  08/15/17 1404 - - - - - 96 % - -  08/15/17 1400 128/75 - - (!) 112 (!) 26 99 % - -  08/15/17 1330 134/78 - - 77 20 99 % - -  08/15/17 1300 (!) 143/79 - - 73 19 98 % - -  08/15/17 1245 128/74 - - 95 (!) 21 98 % - -  08/15/17 1230 (!) 149/101 - - 91 18 96 % - -  08/15/17 1215 - - - (!) 101 19 97 % - -  08/15/17 1200 127/67 - - 98 20 95 % - -  08/15/17 1154 - - - - - - 6\' 1"  (1.854 m) 82.6 kg (182 lb)  08/15/17 1150 128/79 98.6 F (37 C) Oral 97 16 95 % - -    3:37 PM Reevaluation with update and discussion. After initial assessment and treatment, an updated evaluation reveals patient reports his mucus has broken up somewhat after the, first heart rate increased to 135-140 at this time.  Patient remains alert and cooperative.  Findings discussed with patient and daughter, all questions answered. Daleen Bo   Medical Decision Making: Patient with recurrent respiratory difficulty, now with bilateral pneumonia.  Initial vital signs normal, heart rate increased after continuous nebulizer given to improve respiratory status.  Patient with chronic atrial fibrillation, now tachycardic.  Additional IV fluids ordered, patient will require admission.  Doubt severe sepsis.  Mild elevation of  bilirubin.  CRITICAL CARE-yes Performed by: Daleen Bo  Nursing Notes Reviewed/ Care Coordinated Applicable Imaging Reviewed Interpretation of Laboratory Data incorporated into ED treatment  3:44 PM-Consult complete with hospitalist. Patient case explained and discussed.  She agrees to admit patient for further evaluation and treatment. Call ended at 4:01 PM  Plan: Admit  Final Clinical Impressions(s) / ED Diagnoses   Final diagnoses:  Community acquired pneumonia, unspecified laterality  Hypokalemia  Hyperbilirubinemia  Anemia, unspecified type    ED Discharge Orders    None       Daleen Bo, MD 08/15/17 1601

## 2017-08-15 NOTE — Progress Notes (Signed)
Pharmacy Antibiotic Note  Travis Ross is a 84 y.o. male admitted on 08/15/2017 with acute respiratory failure.  Pharmacy has been consulted for vancomycin and cefepime dosing for HCAP. Family reports this is 6th episode of pneumonia since 11/2016 with recent course of Levaquin. CXR shows bilateral pneumonia. Renal function is normal, afebrile, and WBC mildly elevated. Ceftriaxone 1 g IV given at 16:03.  Plan: Vancomycin 1500 mg IV load then 1000 mg IV q12h, goal trough 15-20 mcg/ml Cefepime 2 g IV q12h Monitor renal function, clinical progress, C/S and de-escalation as able Vancomycin trough as clinically indicated   Height: 6\' 1"  (185.4 cm) Weight: 182 lb (82.6 kg) IBW/kg (Calculated) : 79.9  Temp (24hrs), Avg:98.1 F (36.7 C), Min:97.5 F (36.4 C), Max:98.6 F (37 C)  Recent Labs  Lab 08/15/17 1229  WBC 10.6*  CREATININE 0.70    Estimated Creatinine Clearance: 72.1 mL/min (by C-G formula based on SCr of 0.7 mg/dL).    No Known Allergies  Antimicrobials this admission: Ceftriaxone x1 6/18 vancomycin 6/18 >>  Cefepime 6/18 >>   Dose adjustments this admission: n/a  Microbiology results: 6/18 BCx:  6/18 Respiratory panel: 6/18 Sputum:   6/18 Strep pneumo antigen:   Thank you for allowing pharmacy to be a part of this patient's care.  Renold Genta, PharmD, BCPS Clinical Pharmacist Clinical phone for 08/15/2017 until 10p is [phone] Please check AMION for all [facility] numbers 08/15/2017 5:41 PM

## 2017-08-15 NOTE — ED Triage Notes (Signed)
Pt from nursing home via [facility]; pt c/o SOB off and on for 4 months, worsening past couple of days w/ productive cough; saw PCP this AM recommended that he come to ED, speculated pneumonia; hx pneumonia, copd, Afib, takes plavix; non ambulatory, partial weight bearing; A & O; hx L3 herniation, limited use of arms bilaterally; no cp, no N/V  HR 110 RR 18 BP 126/72 97%4 L (does not normally wear o2, sats in 80's when FD arrived)

## 2017-08-15 NOTE — H&P (Addendum)
History and Physical    Travis Ross LFY:[id number] DOB: February 26, 1927 DOA: 08/15/2017  **Will admit patient based on the expectation that the patient will need hospitalization/ hospital care that crosses at least 2 midnights  PCP: [name], MD   Attending physician: Lorin Mercy  Patient coming from/Resides with: Assisted living facility  Chief Complaint: Acute respiratory failure with hypoxia  HPI: Travis Ross is a 84 y.o. male with medical history significant for hypothyroidism, nonambulatory state secondary to history of headache distal femur fracture status post right total knee replacement October 2018, COPD with chronic bronchitis but not on chronic oxygen, myasthenia gravis on Imuran, cervical spine cord compression with residual paresthesias, dyslipidemia, GERD and chronic systolic heart failure.  The patient was last hospitalized in December 2018 with respiratory symptoms attributed to community-acquired pneumonia as well as progression of his systolic dysfunction.  Until the December admission had been on Lasix prn for chronic lower extremity edema in the setting of chronic venous stasis.  Echo from December 2018 admission reveals significant drop in EF from 45 to 50% in 2017 to 30 to 35% in December 2018 with the development of pulmonary hypertension.  Was discharged to skilled living facility.  Patient and family member at bedside report that since October of last year patient has been treated 6 times for pneumonia and just recently completed a course of Levaquin for pneumonia.  Patient has underlying myasthenia gravis and has difficulty swallowing large noncoated pills but does not have any reported difficulty with swallowing solids or liquids otherwise.  Patient reports that at the facility he is given a diet that is high in sugar and salt.  Has had issues with recurrent dyspnea intermittently for 4 months accompanied by a wet sounding nonproductive cough.  EMS was called to the  facility today and patient was noted to be hypoxemic on RA with sats in the 80s.  He was afebrile normotensive and not tachycardic.  BNP was slightly elevated at 339, potassium 3.8, renal function normal, white count essentially normal at 10,600 differential not obtained.  Radiologist read 2 view chest x-ray as bilateral pneumonia.  EDP has given the patient Solu-Medrol IV, as well as IV antibiotics consisting of Rocephin and Zithromax.  He is also been given nebulizer treatments which did precipitate transient tachycardia.  ED Course:  Vital Signs: BP 132/62   Pulse (!) 122   Temp 98.6 F (37 C) (Oral)   Resp (!) 24   Ht _0  (1.854 m)   Wt 82.6 kg (182 lb)   SpO2 94%   BMI 24.01 kg/m  CXR: As above Lab data: Sodium 140, potassium 2.8, chloride 103, CO2 28, glucose 120, BUN 16, creatinine 0.7, anion gap 9, albumin 2.8, total bilirubin 1.5, BNP 339, white count 10,600 differential not obtained, hemoglobin 10.1, platelets 176,000 Medications and treatments: Albuterol neb 5 mg x 1, continuous albuterol neb 10 mg/HR x1, Solu-Medrol 125 mg IV x1, Mucinex tablet x1, Robitussin 100 mg liquid x1, potassium 40 mEq x 1, NS IV bolus 500 cc x 2, Rocephin 1 g IV x1, Zithromax 500 mg IV x1  Review of Systems:  In addition to the HPI above,  No Fever-chills, myalgias or other constitutional symptoms No Headache, changes with Vision or hearing, new weakness, tingling, numbness in any extremity, dizziness, dysarthria or word finding difficulty, gait disturbance or imbalance, tremors or seizure activity No problems swallowing food or Liquids, indigestion/reflux, choking or coughing while eating, abdominal pain with or after eating No Chest pain,  palpitations, DOE (nonambulatory) No Abdominal pain, N/V, melena,hematochezia, dark tarry stools, constipation No dysuria, malodorous urine, hematuria or flank pain No new skin masses or bruises, No new joint pains, aches, swelling or redness No recent  unintentional weight gain or loss No polyuria, polydypsia or polyphagia   Past Medical History:  Diagnosis Date  . A-fib ([place])   . CAP (community acquired pneumonia) 02/04/2017  . COPD with chronic bronchitis and emphysema ([place])   . Essential hypertension   . Femur fracture, right ([place])    Related to a fall. Periprosthetic distal femur fracture (close to the right knee prosthesis).  > Initial plans were to treat with brace, however, has now progressed and may require surgery  . GERD (gastroesophageal reflux disease)   . Glaucoma   . History of Cervical spinal cord compression w/ residual hand parestheisa   . Hyperlipidemia   . Hypothyroidism (acquired)    On Levothyroxine  . Myasthenia gravis ([place])    Now (prior to recent femur fracture) was relegated to maneuvering himself around on a Rolling Walker (Rollator) using his feet & sitting on the walker.  Does not have arm strength to mover a wheelchair or transfer.;   . Osteoarthritis    Global osteoarthritis involving back, hips and knees as well as elbows.  . Paresthesia    bilateral upper extremities, very weak ; poor grip  (must use adaptable silverware );   . Permanent atrial fibrillation (HCC)    Long-standing (> 4 yrs). Initially evaluated with Stress Test & Echo - Pt reports that these studies were "normal"    Past Surgical History:  Procedure Laterality Date  . BACK SURGERY     x 6  . CARPAL TUNNEL RELEASE    . CHOLECYSTECTOMY    . ELBOW SURGERY  08/2015   Total of 3 surgeries  . REPLACEMENT TOTAL KNEE Bilateral    Right TKA 2003, left TKA 2010    . TOTAL KNEE REVISION Right 12/15/2016   Procedure: RIGHT TOTAL KNEE REVISION;  Surgeon: Gaynelle Arabian, MD;  Location: [facility];  Service: Orthopedics;  Laterality: Right;    Social History   Socioeconomic History  . Marital status: Widowed    Spouse name: Not on file  . Number of children: 6  . Years of education: 63  . Highest education level: Not on file    Occupational History  . 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 Financial resource strain: Not on file  . Food insecurity:    Worry: Not on file    Inability: Not on file  . Transportation needs:    Medical: Not on file    Non-medical: Not on file  Tobacco Use  . Smoking status: Never Smoker  . Smokeless tobacco: Never Used  Substance and Sexual Activity  . Alcohol use: No  . Drug use: No  . Sexual activity: Never  Lifestyle  . Physical activity:    Days per week: Not on file    Minutes per session: Not on file  . Stress: Not on file  Relationships  . Social connections:    Talks on phone: Not on file    Gets together: Not on file    Attends religious service: Not on file    Active member of club or organization: Not on file    Attends meetings of clubs or organizations: Not on file    Relationship status: Not on file  .  Intimate partner violence:    Fear of current or ex partner: Not on file    Emotionally abused: Not on file    Physically abused: Not on file    Forced sexual activity: Not on file  Other Topics Concern  . Not on file  Social History Narrative   He is a widower, who is recently moved down to [place] to live in assisted living facility to his daughter.   He has 6 total children, 7 grandchildren and 2 great-grandchildren.    He currently lives in assisted living facility, and is essentially immobilized.    Prior to his fall and leg fracture, he was able to maneuver himself around using a rolling walker, however since the fall and fracture, he is now essentially immobile requiring assistance for maneuvering him on a wheelchair. He is right leg is in an immobilizer brace.       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    Mobility: Nonambulatory, full 2+ assist with pivoting to transfer from bed to wheelchair or to toilet Work history: Not obtained   No Known Allergies  Family  History  Problem Relation Age of Onset  . Other Mother        Polio  . Heart failure Father   . Arthritis Sister   . Arthritis Brother      Prior to Admission medications   Medication Sig Start Date End Date Taking? Authorizing Provider  acetaminophen (TYLENOL) 325 MG tablet Take 650 mg by mouth every 6 (six) hours as needed for moderate pain.   Yes [provider]  aspirin EC 81 MG tablet Take 81 mg by mouth daily.   Yes [provider]  atorvastatin (LIPITOR) 10 MG tablet Take 10 mg by mouth every evening.  10/28/16  Yes [provider]  azaTHIOprine (IMURAN) 50 MG tablet Take 50 mg by mouth 2 (two) times daily.   Yes [provider]  benzonatate (TESSALON) 100 MG capsule Take 100 mg by mouth at bedtime.   Yes [provider]  Brinzolamide-Brimonidine (SIMBRINZA) 1-0.2 % SUSP Place 1 drop into both eyes 2 (two) times daily.   Yes [provider]  clopidogrel (PLAVIX) 75 MG tablet Take 75 mg by mouth 2 (two) times a week.  10/28/16  Yes [provider]  docusate sodium (COLACE) 100 MG capsule Take 100 mg by mouth 2 (two) times daily.   Yes [provider]  DULERA 100-5 MCG/ACT AERO Take 2 puffs by mouth 2 (two) times daily.  10/10/16  Yes [provider]  fluticasone (FLONASE) 50 MCG/ACT nasal spray Place 1 spray into both nostrils daily.   Yes [provider]  furosemide (LASIX) 40 MG tablet Take 40 mg by mouth daily. 11/09/16  Yes [provider]  guaiFENesin (MUCINEX) 600 MG 12 hr tablet Take 600 mg by mouth 2 (two) times daily.   Yes [provider]  Ipratropium-Albuterol (COMBIVENT RESPIMAT) 20-100 MCG/ACT AERS respimat Inhale 2 puffs into the lungs 2 (two) times daily.    Yes [provider]  levothyroxine (SYNTHROID, LEVOTHROID) 200 MCG tablet Take 200 mcg by mouth daily.  10/31/16  Yes [provider]  losartan (COZAAR) 25 MG tablet Take 0.5 tablets (12.5 mg  total) by mouth daily. Patient taking differently: Take 50 mg by mouth daily.  02/08/17  Yes Katherine Roan, MD  magnesium hydroxide (MILK OF MAGNESIA) 400 MG/5ML suspension Take 30 mLs by mouth See admin instructions.  every 24 hours as needed for constipation.   Yes [provider]  metoprolol succinate (TOPROL-XL) 50 MG 24 hr tablet Take 50 mg by mouth daily. 10/28/16  Yes [provider]  oxyCODONE (OXYCONTIN) 30 MG 12 hr tablet Take 1 tablet (30 mg total) by mouth every 12 (twelve) hours. 12/20/16  Yes Perkins, Alexzandrew L, PA-C  pantoprazole (PROTONIX) 40 MG tablet Take 40 mg by mouth 2 (two) times daily.  10/28/16  Yes [provider]  polycarbophil (FIBERCON) 625 MG tablet Take 1 tablet (625 mg total) by mouth at bedtime. 12/20/16  Yes Perkins, Alexzandrew L, PA-C  potassium chloride 20 MEQ/15ML (10%) SOLN Take 7.5 mLs by mouth daily. 08/05/17  Yes [provider]  predniSONE (DELTASONE) 10 MG tablet Take 1 tablet (10 mg total) by mouth daily with breakfast. Patient taking differently: Take 5 mg by mouth daily with breakfast.  02/07/17  Yes Winfrey, Jenne Pane, MD  Psyllium (FIBER) 0.52 g CAPS Take 0.52 g by mouth at bedtime.   Yes [provider]  senna (SENOKOT) 8.6 MG tablet Take 1 tablet by mouth every evening.   Yes [provider]  sertraline (ZOLOFT) 100 MG tablet Take 150 mg by mouth daily.  10/28/16  Yes [provider]  temazepam (RESTORIL) 30 MG capsule Take 30 mg by mouth at bedtime.  11/09/16  Yes [provider]  TRAVATAN Z 0.004 % SOLN ophthalmic solution Place 1 drop into both eyes at bedtime.  11/09/16  Yes [provider]    Physical Exam: Vitals:   08/15/17 1415 08/15/17 1430 08/15/17 1545 08/15/17 1600  BP: 132/74 (!) 143/66 111/62 132/62  Pulse: 68 (!) 129 (!) 135 (!) 122  Resp: _0 (!) 24  Temp:      TempSrc:      SpO2: 97% 99% 94% 94%  Weight:      Height:           Constitutional: NAD, calm, mildly uncomfortable 2/2 persistent dyspnea and wet sounding nonproductive cough Eyes: PERRL, lids and conjunctivae normal ENMT: Mucous membranes are moist. Posterior pharynx clear of any exudate or lesions.age-appropriate dentition.  Neck: normal, supple, no masses, no thyromegaly Respiratory: Coarse to auscultation anteriorly and posteriorly with diffuse crackles but no wheezing, normal respiratory effort without accessory muscle use at rest.  Wet sounding nonproductive cough, nasal cannula oxygen Cardiovascular: Irregular rhythm with underlying atrial fibrillation- minimal tachycardia, no murmurs / rubs / gallops.  Bilateral lower extremity edema 3-4+ extending from toes to just above knees and to a lesser extent 1+ at thighs. 2+ pedal pulses. No carotid bruits.  Abdomen: no tenderness, no masses palpated. No hepatosplenomegaly. Bowel sounds positive.  Musculoskeletal: no clubbing / cyanosis. No joint deformity upper and lower extremities. Good ROM, no contractures. Normal muscle tone.  Skin: no rashes, lesions, No induration-shot has irregular shaped areas on sacrum that are reddened with 2 smaller stage I ulcers directly over sacrum consistent with evolving decubitus Neurologic: CN 2-12 grossly intact. Sensation intact, DTR normal. Strength 5/5 x all 4 extremities.  Psychiatric: Normal judgment and insight. Alert and oriented x 3. Normal mood.    Labs on Admission: I have personally reviewed following labs and imaging studies  CBC: Recent Labs  Lab 08/15/17 1229  WBC 10.6*  HGB 10.1*  HCT 33.4*  MCV 89.3  PLT 093   Basic Metabolic Panel: Recent Labs  Lab 08/15/17 1229  NA 140  K 2.8*  CL 103  CO2 28  GLUCOSE 120*  BUN 16  CREATININE 0.70  CALCIUM 8.2*   GFR: Estimated Creatinine Clearance: 72.1 mL/min (by C-G formula based on SCr of 0.7 mg/dL). Liver Function Tests: Recent Labs  Lab 08/15/17 1229  AST 22  ALT 12*  ALKPHOS 111   BILITOT 1.5*  PROT 6.2*  ALBUMIN 2.8*   No results for input(s): LIPASE, AMYLASE in the last 168 hours. No results for input(s): AMMONIA in the last 168 hours. Coagulation Profile: No results for input(s): INR, PROTIME in the last 168 hours. Cardiac Enzymes: No results for input(s): CKTOTAL, CKMB, CKMBINDEX, TROPONINI in the last 168 hours. BNP (last 3 results) No results for input(s): PROBNP in the last 8760 hours. HbA1C: No results for input(s): HGBA1C in the last 72 hours. CBG: No results for input(s): GLUCAP in the last 168 hours. Lipid Profile: No results for input(s): CHOL, HDL, LDLCALC, TRIG, CHOLHDL, LDLDIRECT in the last 72 hours. Thyroid Function Tests: No results for input(s): TSH, T4TOTAL, FREET4, T3FREE, THYROIDAB in the last 72 hours. Anemia Panel: No results for input(s): VITAMINB12, FOLATE, FERRITIN, TIBC, IRON, RETICCTPCT in the last 72 hours. Urine analysis:    Component Value Date/Time   COLORURINE YELLOW 02/04/2017 0656   APPEARANCEUR CLEAR 02/04/2017 0656   LABSPEC 1.027 02/04/2017 0656   PHURINE 5.0 02/04/2017 0656   GLUCOSEU NEGATIVE 02/04/2017 0656   HGBUR NEGATIVE 02/04/2017 0656   BILIRUBINUR NEGATIVE 02/04/2017 0656   KETONESUR NEGATIVE 02/04/2017 0656   PROTEINUR NEGATIVE 02/04/2017 0656   NITRITE NEGATIVE 02/04/2017 0656   LEUKOCYTESUR NEGATIVE 02/04/2017 0656   Sepsis Labs: _0 (procalcitonin:4,lacticidven:4) )No results found for this or any previous visit (from the past 240 hour(s)).   Radiological Exams on Admission: Dg Chest 2 View  Result Date: 08/15/2017 CLINICAL DATA:  Short of breath, wheezing, cough for a week, history of recent pneumonia EXAM: CHEST - 2 VIEW COMPARISON:  CT chest of 02/04/2017 and chest x-ray of the same day FINDINGS: The lungs are not as well aerated but the right basilar pneumonia has cleared. However, as a result of the poor inspiration, is difficult to exclude pneumonia remaining at the left lung base in  the right mid upper lung. A tiny effusion may be present blunting the costophrenic angles on the lateral view. Cardiomegaly is stable. No bony abnormality is seen. There are degenerative changes both shoulders. IMPRESSION: 1. Poor inspiration but clearing of the right basilar pneumonia. Otherwise patchy pneumonia in the right mid lung and left lung base cannot be excluded. 2. Stable cardiomegaly. Electronically Signed   By: Ivar Drape M.D.   On: 08/15/2017 13:40    EKG: (Independently reviewed) atrial fibrillation with ventricular rate 94 bpm, QTC 491 ms, delayed R wave rotation, no definitive acute ischemic changes  Assessment/Plan Principal Problem:   Acute respiratory failure with hypoxia -Patient presents with acute hypoxemic respiratory failure with recurrent episodes of dyspnea and shortness of breath previously treated as community-acquired pneumonia in the outpatient setting (6x since Oct 2018) -Differential includes recurrent pneumonia vs aspiration pneumonitis vs acute systolic heart failure exacerbation -Continue supportive care with oxygen and wean as tolerated noting was on RA prior to current symptoms -Obtain stat CT of the chest to better characterize **consistent with bilateral pneumonia-see treatment plan below  Active Problems:   Myasthenia gravis  -Patient and family deny significant difficulties with swallowing other than issues with large noncoated pills which at times have become lodged in his esophagus -Given respiratory symptoms and concern over possible aspiration as etiology to  acute symptoms will obtain speech therapy evaluation -No indication at this juncture to make patient NPO -Continue Imuran -Noted with strong cough during bedside examination    Acute on chronic systolic heart failure  -Patient presents with waxing and waning respiratory symptoms culminating in hypoxemia today with repeated treatments for community-acquired pneumonia; completed a course of  Levaquin in the past 7 days -Patient has significant peripheral edema although only modest elevation in BNP but given the degree of edema with clinical pulmonary exam concerning for overt heart failure exacerbation -Lasix 60 mg IV q 12 hrs x3 doses then reevaluate response and Lasix requirement in the next 24 hours -Continue preadmission beta-blocker and ARB **recent SBP 108 so hold Cozaar for now -Daily weights, strict I's/O  -CT of the chest as above -Patient does have hypoalbuminemia which could be contributing to peripheral edema -Patient's peak weight during hospitalization in December 2018 was 210 lbs and current weight 182 lbs-this may be attributed to more consistent utilization of Lasix noting prior Lasix was prn lower extremity edema -Patient dry weight unknown and patient reports at facility is given a diet high in sugar and salt -Echocardiogram December 2018 with EF 30 to 35% with moderate TR, pulmonary hypertension 47 mmHg and mild mitral regurgitation-this EF has dropped significantly since 2017 when it was estimated at 53 to 50%  -Consider cardiology consultation-Dr. Ellyn Hack his primary cardiologist    Acute hypokalemia -Has been given 40 mEq in the ER -We will give 20 mEq twice daily -Magnesium -Follow electrolytes    Chronic atrial fibrillation -Currently rate controlled -Based on outpatient cardiology note Plavix is being utilized for stroke prophylaxis although consideration given to initiation of DOAC -Continue beta-blocker    COPD with chronic bronchitis and emphysema  -Chest x-ray read as bilateral pneumonia -Until better clarified we will continue empiric Zithromax and Rocephin **CT chest l left lower lobe consolidation, scattered patchy lingular and right lower lobe opacities and a new 1 x 2.3 cm right upper lobe nodular opacity-given recurrent pneumonia and failure of outpatient Levaquin will broaden antibiotic coverage to cefepime and vancomycin especially since  patient is resident of nursing facility -Obtain ESR, respiratory procalcitonin, blood cultures, urinary strep, sputum culture -Supportive care as above -Given recent tachycardia in relation to SABA utilize Xopenex as bronchodilator -Continue Dulera and Combivent for now (consider holding Combivent while utilizing Xopenex) -Continue chronic prednisone 10 mg daily for now CURB-65 Severity Score for CAP Confusion: No (0 points) BUN > 19 mg/dL (30mol/L): No (0 points) Respiratory rate greater than or equal to 30: No (0 points) SBP less than 90 mm Hg Or DBP less than or equal to 60 mm Hg: No (0 points) Age greater than or equal to 65: Yes (1 point) 1 point. Low risk: 2.7% 30-day mortality.  PSI/PORT Score: Pneumonia Severity Index for Adult CAP Patient age: [age]Years Male: No (0 points) Nursing home resident: Yes (10 points) Neoplastic disease history: No (0 points) Liver disease: No (0 points) Congestive heart failure: Yes (10 points) Cerebrovascular disease: No (0 points) Renal disease: No (0 points) Altered mental status: No (0 points) Respiratory rate over 29: No (0 points) Systolic blood pressure below 90: No (0 points) Temp below 35.0 deg C (95 deg F) or over 39.9 deg C (103.8 deg F): No (0 points) Pulse over 124: No (0 points) pH below 7.35: No (0 points) BUN over 29: No (0 points) Sodium below 130: No (0 points) Glucose over 249 ([place] or over 13.8 (SI): No (  0 points) Hematocrit below 30%: No (0 points) Partial pressure of oxygen below 60: Yes (10 points) Pleural effusion on x-ray: Yes (10 points) Score: 128 points. Risk class IV, >2.8% mortality.    Decubitus ulcer of sacral region, stage 1 -Air mattress -Wound care consult -Albumin low-nutritional consultation    Hyperlipidemia -Continue Lipitor    GERD (gastroesophageal reflux disease) -Continue Protonix    Hypothyroidism (acquired) -Continue Synthroid -TSH    History of Cervical spinal cord compression   -Patient has chronic paresthesias making it difficult to perform any ADLs including something as simple as holding urinal -Condom catheter    **Additional lab, imaging and/or diagnostic evaluation at discretion of supervising physician  DVT prophylaxis: Lovenox Code Status: DO NOT RESUSCITATE Family Communication: Family at bedside Disposition Plan: Assisted living facility Consults called: None    Crystelle Ferrufino L. ANP-BC [facility] Pager [phone]   If 7PM-7AM, please contact night-coverage [email or web address] Password [facility]  08/15/2017, 4:38 PM

## 2017-08-16 ENCOUNTER — Inpatient Hospital Stay (HOSPITAL_COMMUNITY): Payer: Medicare HMO

## 2017-08-16 DIAGNOSIS — I340 Nonrheumatic mitral (valve) insufficiency: Secondary | ICD-10-CM

## 2017-08-16 DIAGNOSIS — I361 Nonrheumatic tricuspid (valve) insufficiency: Secondary | ICD-10-CM

## 2017-08-16 LAB — GLUCOSE, CAPILLARY
GLUCOSE-CAPILLARY: 113 mg/dL — AB (ref 65–99)
Glucose-Capillary: 121 mg/dL — ABNORMAL HIGH (ref 65–99)

## 2017-08-16 LAB — URINALYSIS, ROUTINE W REFLEX MICROSCOPIC
Bilirubin Urine: NEGATIVE
Glucose, UA: NEGATIVE mg/dL
Hgb urine dipstick: NEGATIVE
Ketones, ur: NEGATIVE mg/dL
LEUKOCYTES UA: NEGATIVE
NITRITE: NEGATIVE
PH: 5 (ref 5.0–8.0)
Protein, ur: NEGATIVE mg/dL
SPECIFIC GRAVITY, URINE: 1.01 (ref 1.005–1.030)

## 2017-08-16 LAB — CBC
HCT: 30.7 % — ABNORMAL LOW (ref 39.0–52.0)
Hemoglobin: 9.2 g/dL — ABNORMAL LOW (ref 13.0–17.0)
MCH: 26.6 pg (ref 26.0–34.0)
MCHC: 30 g/dL (ref 30.0–36.0)
MCV: 88.7 fL (ref 78.0–100.0)
PLATELETS: 142 10*3/uL — AB (ref 150–400)
RBC: 3.46 MIL/uL — ABNORMAL LOW (ref 4.22–5.81)
RDW: 16.8 % — ABNORMAL HIGH (ref 11.5–15.5)
WBC: 7.3 10*3/uL (ref 4.0–10.5)

## 2017-08-16 LAB — BASIC METABOLIC PANEL
Anion gap: 11 (ref 5–15)
BUN: 20 mg/dL (ref 6–20)
CO2: 23 mmol/L (ref 22–32)
CREATININE: 0.76 mg/dL (ref 0.61–1.24)
Calcium: 7.8 mg/dL — ABNORMAL LOW (ref 8.9–10.3)
Chloride: 107 mmol/L (ref 101–111)
GFR calc Af Amer: >60 mL/min (ref >60)
Glucose, Bld: 132 mg/dL — ABNORMAL HIGH (ref 65–99)
Potassium: 3.4 mmol/L — ABNORMAL LOW (ref 3.5–5.1)
SODIUM: 141 mmol/L (ref 135–145)

## 2017-08-16 LAB — MRSA PCR SCREENING: MRSA BY PCR: NEGATIVE

## 2017-08-16 LAB — ECHOCARDIOGRAM COMPLETE
Height: 73 in
WEIGHTICAEL: 2960 [oz_av]

## 2017-08-16 LAB — MAGNESIUM: Magnesium: 1.4 mg/dL — ABNORMAL LOW (ref 1.7–2.4)

## 2017-08-16 LAB — HIV ANTIBODY (ROUTINE TESTING W REFLEX): HIV SCREEN 4TH GENERATION: NONREACTIVE

## 2017-08-16 MED ORDER — BENZONATATE 100 MG PO CAPS: 100.0000 mg | ORAL_CAPSULE | Freq: Three times a day (TID) | ORAL | Status: DC | PRN

## 2017-08-16 MED ORDER — METOPROLOL TARTRATE 25 MG PO TABS
25.0000 mg | ORAL_TABLET | Freq: Four times a day (QID) | ORAL | Status: AC
Start: 2017-08-16 — End: 2017-08-19
  Administered 2017-08-16 – 2017-08-19 (×15): 25 mg via ORAL
  Filled 2017-08-16 (×15): qty 1

## 2017-08-16 MED ORDER — MAGNESIUM SULFATE 2 GM/50ML IV SOLN
2.0000 g | Freq: Once | INTRAVENOUS | Status: AC
  Administered 2017-08-16: 2 g via INTRAVENOUS
  Filled 2017-08-16: qty 50

## 2017-08-16 MED ORDER — FUROSEMIDE 10 MG/ML IJ SOLN
60.0000 mg | Freq: Two times a day (BID) | INTRAMUSCULAR | Status: DC
  Administered 2017-08-16 – 2017-08-18 (×4): 60 mg via INTRAVENOUS
  Filled 2017-08-16 (×4): qty 6

## 2017-08-16 MED ORDER — LEVALBUTEROL HCL 0.63 MG/3ML IN NEBU: 0.6300 mg | INHALATION_SOLUTION | Freq: Four times a day (QID) | RESPIRATORY_TRACT | Status: DC | PRN

## 2017-08-16 MED ORDER — MAGNESIUM OXIDE 400 (241.3 MG) MG PO TABS
400.0000 mg | ORAL_TABLET | Freq: Two times a day (BID) | ORAL | Status: DC
  Administered 2017-08-16 – 2017-08-25 (×18): 400 mg via ORAL
  Filled 2017-08-16 (×18): qty 1

## 2017-08-16 NOTE — Consult Note (Addendum)
[place] Nurse wound consult note Reason for Consult: Consulted for sacral area, patient also has chronic full thickness wound on left lower extremity with no wound care orders. Wound type: pressure, MASD, questionable surgical and venous insufficiency, patient states it is where a doctor removed something and it never healed. Pressure Injury POA: Yes Measurement:sacral area reddened but blanches, skin intact with MASD. Left leg wound is 1.4cm x 1cm x 0.2cm with rolled edges, loose white slough, small amt of yellow exudate, no odor, periwound intact. Wound bed:see above Drainage (amount, consistency, odor) see above Periwound:see above Dressing procedure/placement/frequency:I have provided nurses with orders for continuing foam on sacral area and encouraging pt to not lie supine for long periods. Continue floating heels and using protective heel foam. To left lower pretibial wound, peel foam, cleanse with NS, pat dry, apply NS moistened 2x2 to reapply foam, perform BID. May use foam for 3 days or until soiled. Patient could benefit from a nutritional consult or protein supplement for optimal wound healing, please order if you agree. We will not follow, but will remain available to this patient, to nursing, and the medical and/or surgical teams.  Please re-consult if we need to assist further.  Fara Olden, RN-C, WTA-C, [place] [facility]

## 2017-08-16 NOTE — Progress Notes (Addendum)
PROGRESS NOTE    Travis Ross  XBM:[id number] DOB: 25-Dec-1926 DOA: 08/15/2017 PCP: Travis Poll, MD   Brief Narrative:  84 y.o. male with Travis Ross Past Medical History of afib; myasthenia gravis; hypothyroidism; HLD; hand paresthesias related to C-spine disease; glaucoma; HTN; and COPD who presents with SOB and recurrent respiratory illness since December.  He had lower extremity edema on exam at presentation concerning for HF exacerbation and had CT chest with findings c/w pneumonia.   Assessment & Plan:   Principal Problem:   Acute respiratory failure with hypoxia (HCC) Active Problems:   Hyperlipidemia   COPD with chronic bronchitis and emphysema (HCC)   GERD (gastroesophageal reflux disease)   Hypothyroidism (acquired)   History of Cervical spinal cord compression (HCC)   Myasthenia gravis (HCC)   Acute on chronic systolic heart failure (HCC)   Acute hypokalemia   Decubitus ulcer of sacral region, stage 1   Acute respiratory failure with hypoxia  -Patient presents with acute hypoxemic respiratory failure with recurrent episodes of dyspnea and shortness of breath previously treated as community-acquired pneumonia in the outpatient setting (6x since Oct 2018) - Possibly multifactorial with pneumonia on CT, but notable LE edema on exam which could be c/w HF exacerbation -Continue supportive care with oxygen and wean as tolerated noting was on RA prior to current symptoms  Pneumonia: pt with LLL and RLL opacities as well as RUL nodular opacity on imaging.   -Obtain ESR (elevated), respiratory procalcitonin (normal), blood cultures, urinary strep (negative), sputum culture - MRSA PCR - CT chest recommending f/u 8-12 weeks  - Vanc/cefepime for now, wean as tolerated  COPD with chronic bronchitis and emphysema  -Pt with transmitted upper airway sounds, don't think this is c/w COPD exacerbation -Abx for pneumonia as noted above -scheduled nebs, xopenex prn -Continue Dulera and  Combivent for now  -Continue chronic prednisone 10 mg daily for now    Myasthenia gravis  - He notes this has been stable with no exacerbation in about 2 years -Continue Imuran - avoid meds that could exacerbate MG  Dysphagia - pt declined further evaluation by speech at this time and noted he would not be open to any dietary modifications at this time.  Discussed with him at the bedside and he repeated this.  Will continue to discuss, but holding on this for now.    Acute on chronic systolic heart failure  - Elevated BNP, lower extremity edema - Continue lasix 60 IV BID -Continue beta blocker, currently holding arb -Daily weights, strict I's/O  -Hypoalbuminemia.  Follow UA.   -Patient's peak weight during hospitalization in December 2018 was 210 lbs and current weight 182 lbs -Patient dry weight unknown and patient reports at facility is given [name] diet high in sugar and salt -Echocardiogram December 2018 with EF 30 to 35% with moderate TR, pulmonary hypertension 47 mmHg and mild mitral regurgitation-this EF has dropped significantly since 2017 when it was estimated at 45 to 50%  -Consider cardiology consultation as needed - follow repeat echo (pending)    Acute hypokalemia -Has been given 40 mEq in the ER -We will give 20 mEq twice daily - follow magnesium (low, will replete, discussed with neuro) -Follow electrolytes    Chronic atrial fibrillation with RVR -with intermittent RVR, will increase metop to 25 mg q6 and follow -eliquis    Decubitus ulcer of sacral region, stage 1 -Air mattress -Wound care consult -Albumin low-nutritional consultation    Hyperlipidemia -Continue Lipitor    GERD (gastroesophageal reflux  disease) -Continue Protonix    Hypothyroidism (acquired) -Continue Synthroid -TSH    History of Cervical spinal cord compression  -Patient has chronic paresthesias making it difficult to perform any ADLs including something as simple as holding  urinal -Condom catheter   Anemia: chronic, follow  Thrombocytopenia: follow  DVT prophylaxis: eliquis Code Status: DNR Family Communication: none at bedside Disposition Plan: pending   Consultants:   none  Procedures:   none  Antimicrobials: Anti-infectives (From admission, onward)   Start     Dose/Rate Route Frequency Ordered Stop   08/16/17 1800  azithromycin (ZITHROMAX) 500 mg in sodium chloride 0.9 % 250 mL IVPB  Status:  Discontinued     500 mg 250 mL/hr over 60 Minutes Intravenous Every 24 hours 08/15/17 1700 08/15/17 1732   08/16/17 0630  vancomycin (VANCOCIN) IVPB 1000 mg/200 mL premix     1,000 mg 200 mL/hr over 60 Minutes Intravenous Every 12 hours 08/15/17 1750     08/16/17 0000  cefTRIAXone (ROCEPHIN) 1 g in sodium chloride 0.9 % 100 mL IVPB  Status:  Discontinued     1 g 200 mL/hr over 30 Minutes Intravenous Every 24 hours 08/15/17 1700 08/15/17 1732   08/15/17 1900  ceFEPIme (MAXIPIME) 2 g in sodium chloride 0.9 % 100 mL IVPB     2 g 200 mL/hr over 30 Minutes Intravenous Every 12 hours 08/15/17 1750     08/15/17 1830  vancomycin (VANCOCIN) 1,500 mg in sodium chloride 0.9 % 500 mL IVPB     1,500 mg 250 mL/hr over 120 Minutes Intravenous  Once 08/15/17 1750 08/16/17 0909   08/15/17 1545  cefTRIAXone (ROCEPHIN) 1 g in sodium chloride 0.9 % 100 mL IVPB     1 g 200 mL/hr over 30 Minutes Intravenous  Once 08/15/17 1537 08/15/17 1638   08/15/17 1545  azithromycin (ZITHROMAX) 500 mg in sodium chloride 0.9 % 250 mL IVPB     500 mg 250 mL/hr over 60 Minutes Intravenous  Once 08/15/17 1537 08/15/17 1911     Subjective: Notes congestion, cough.  Nebs helping. This has been on/off since December. No O2 at home.  Objective: Vitals:   08/15/17 2346 08/16/17 0423 08/16/17 0751 08/16/17 0854  BP: 101/69  138/87   Pulse: (!) 153  100   Resp: 18  20   Temp: 98.3 F (36.8 C)  98.4 F (36.9 C)   TempSrc: Oral  Oral   SpO2: 95%  99% 97%  Weight:  83.9 kg (185  lb)    Height:        Intake/Output Summary (Last 24 hours) at 08/16/2017 1032 Last data filed at 08/16/2017 0800 Gross per 24 hour  Intake 2891.6 ml  Output 542 ml  Net 2349.6 ml   Filed Weights   08/15/17 1154 08/16/17 0423  Weight: 82.6 kg (182 lb) 83.9 kg (185 lb)    Examination:  General exam: Appears calm and comfortable  Respiratory system: Transmitted upper airway sounds.  Diffuse rhonchi. Cardiovascular system: S1 & S2 heard, tachycardic. No JVD, murmurs, rubs, gallops or clicks. No pedal edema. Gastrointestinal system: Abdomen is nondistended, soft and nontender. No organomegaly or masses felt. Normal bowel sounds heard. Central nervous system: Alert and oriented. No focal neurological deficits. Extremities: trace LEE.  Dependent edema present. Skin: No rashes, lesions or ulcers Psychiatry: Judgement and insight appear normal. Mood & affect appropriate.     Data Reviewed: I have personally reviewed following labs and imaging studies  CBC: Recent Labs  Lab 08/15/17 1229 08/16/17 0510  WBC 10.6* 7.3  HGB 10.1* 9.2*  HCT 33.4* 30.7*  MCV 89.3 88.7  PLT 176 381*   Basic Metabolic Panel: Recent Labs  Lab 08/15/17 1229 08/16/17 0510  NA 140 141  K 2.8* 3.4*  CL 103 107  CO2 28 23  GLUCOSE 120* 132*  BUN 16 20  CREATININE 0.70 0.76  CALCIUM 8.2* 7.8*   GFR: Estimated Creatinine Clearance: 72.1 mL/min (by C-G formula based on SCr of 0.76 mg/dL). Liver Function Tests: Recent Labs  Lab 08/15/17 1229  AST 22  ALT 12*  ALKPHOS 111  BILITOT 1.5*  PROT 6.2*  ALBUMIN 2.8*   No results for input(s): LIPASE, AMYLASE in the last 168 hours. No results for input(s): AMMONIA in the last 168 hours. Coagulation Profile: No results for input(s): INR, PROTIME in the last 168 hours. Cardiac Enzymes: No results for input(s): CKTOTAL, CKMB, CKMBINDEX, TROPONINI in the last 168 hours. BNP (last 3 results) No results for input(s): PROBNP in the last 8760  hours. HbA1C: No results for input(s): HGBA1C in the last 72 hours. CBG: Recent Labs  Lab 08/15/17 1933 08/15/17 2120 08/16/17 0752  GLUCAP 245* 212* 121*   Lipid Profile: No results for input(s): CHOL, HDL, LDLCALC, TRIG, CHOLHDL, LDLDIRECT in the last 72 hours. Thyroid Function Tests: Recent Labs    08/15/17 1618  TSH 1.000   Anemia Panel: No results for input(s): VITAMINB12, FOLATE, FERRITIN, TIBC, IRON, RETICCTPCT in the last 72 hours. Sepsis Labs: Recent Labs  Lab 08/15/17 1618  PROCALCITON <0.10    No results found for this or any previous visit (from the past 240 hour(s)).       Radiology Studies: Dg Chest 2 View  Result Date: 08/15/2017 CLINICAL DATA:  Short of breath, wheezing, cough for Jodilyn Giese week, history of recent pneumonia EXAM: CHEST - 2 VIEW COMPARISON:  CT chest of 02/04/2017 and chest x-ray of the same day FINDINGS: The lungs are not as well aerated but the right basilar pneumonia has cleared. However, as Dorethia Jeanmarie result of the poor inspiration, is difficult to exclude pneumonia remaining at the left lung base in the right mid upper lung. Aurielle Slingerland tiny effusion may be present blunting the costophrenic angles on the lateral view. Cardiomegaly is stable. No bony abnormality is seen. There are degenerative changes both shoulders. IMPRESSION: 1. Poor inspiration but clearing of the right basilar pneumonia. Otherwise patchy pneumonia in the right mid lung and left lung base cannot be excluded. 2. Stable cardiomegaly. Electronically Signed   By: Ivar Drape M.D.   On: 08/15/2017 13:40   Ct Chest Wo Contrast  Result Date: 08/15/2017 CLINICAL DATA:  84 year old male with productive cough for 2 days and shortness of breath for 4 months. EXAM: CT CHEST WITHOUT CONTRAST TECHNIQUE: Multidetector CT imaging of the chest was performed following the standard protocol without IV contrast. COMPARISON:  08/15/2017 and prior chest radiographs. 02/04/2017 chest CT FINDINGS: Cardiovascular:  Cardiomegaly again noted. Coronary artery and thoracic aortic atherosclerotic calcifications again identified. No thoracic aortic aneurysm or pericardial effusion. Mediastinum/Nodes: Unchanged mediastinal lymph nodes with index 11 mm RIGHT paratracheal node (series 3: Image 33) and 6 mm AP window node (3:54). No new or enlarging lymph nodes within the chest. No mediastinal mass. The visualized thyroid gland and esophagus are unremarkable. Lungs/Pleura: Consolidation of the LEFT LOWER lobe again noted which may be persistent or recurrent. Patchy ill-defined opacities within the lingula and RIGHT LOWER lobe noted. Clarity Ciszek new 1 x  2.3 cm slightly irregular RIGHT UPPER lobe nodular opacity (4:51) is identified. Very small bilateral pleural effusions are noted with mild bibasilar atelectasis. There is no evidence of pneumothorax. Upper Abdomen: No acute abnormality Musculoskeletal: No acute abnormality or suspicious bony lesion. Bridging syndesmophytes/DISH changes throughout the thoracic spine again noted. IMPRESSION: 1. LEFT LOWER lobe consolidation, scattered patchy lingular and RIGHT LOWER lobe opacities and new 1 x 2.3 cm RIGHT UPPER lobe nodular opacity. Given that some of these opacities are likely infectious/pneumonia, recommend CT follow-up in 8-12 weeks following appropriate antibiotic therapy. 2. Very small bilateral pleural effusions. 3. Cardiomegaly 4. Coronary artery and Aortic Atherosclerosis (ICD10-I70.0). Electronically Signed   By: Margarette Canada M.D.   On: 08/15/2017 17:27        Scheduled Meds: . acetylcysteine  3 mL Nebulization TID  . apixaban  5 mg Oral BID  . aspirin EC  81 mg Oral Daily  . atorvastatin  10 mg Oral QPM  . azaTHIOprine  50 mg Oral BID  . docusate sodium  100 mg Oral BID  . fluticasone  1 spray Each Nare Daily  . ipratropium-albuterol  3 mL Nebulization Q6H  . latanoprost  1 drop Both Eyes QHS  . levothyroxine  200 mcg Oral QAC breakfast  . metoprolol succinate  50 mg Oral  Daily  . mometasone-formoterol  2 puff Inhalation BID  . oxyCODONE  30 mg Oral Q12H  . pantoprazole  40 mg Oral BID  . potassium chloride  20 mEq Oral BID  . predniSONE  5 mg Oral Q breakfast  . psyllium  1 packet Oral QHS  . senna  1 tablet Oral QPM  . sertraline  150 mg Oral Daily  . sodium chloride flush  3 mL Intravenous Q12H  . temazepam  30 mg Oral QHS   Continuous Infusions: . sodium chloride 125 mL/hr at 08/16/17 0800  . ceFEPime (MAXIPIME) IV 2 g (08/16/17 0929)  . vancomycin       LOS: 1 day    Time spent: over 30 min    Fayrene Helper, MD [facility] Pager [phone]  If 7PM-7AM, please contact night-coverage [email or web address] Password [facility] 08/16/2017, 10:32 AM

## 2017-08-16 NOTE — Evaluation (Signed)
Clinical/Bedside Swallow Evaluation Patient Details  Name: Travis Ross MRN: [id number] Date of Birth: 1927-02-19  Today's Date: 08/16/2017 Time: SLP Start Time (ACUTE ONLY): 3474 SLP Stop Time (ACUTE ONLY): 0951 SLP Time Calculation (min) (ACUTE ONLY): 22 min  Past Medical History:  Past Medical History:  Diagnosis Date  . CAP (community acquired pneumonia) 02/04/2017  . COPD with chronic bronchitis and emphysema ([place])   . Essential hypertension   . Femur fracture, right ([place])    Related to a fall. Periprosthetic distal femur fracture (close to the right knee prosthesis).  > Initial plans were to treat with brace, however, has now progressed and may require surgery  . GERD (gastroesophageal reflux disease)   . Glaucoma   . History of Cervical spinal cord compression w/ residual hand parestheisa   . Hyperlipidemia   . Hypothyroidism (acquired)    On Levothyroxine  . Myasthenia gravis ([place])    Now (prior to recent femur fracture) was relegated to maneuvering himself around on a Rolling Walker (Rollator) using his feet & sitting on the walker.  Does not have arm strength to mover a wheelchair or transfer.;   . Osteoarthritis    Global osteoarthritis involving back, hips and knees as well as elbows.  . Paresthesia    bilateral upper extremities, very weak ; poor grip  (must use adaptable silverware );   . Permanent atrial fibrillation (HCC)    Long-standing (> 4 yrs). Initially evaluated with Stress Test & Echo - Pt reports that these studies were "normal"   Past Surgical History:  Past Surgical History:  Procedure Laterality Date  . BACK SURGERY     x 6  . CARPAL TUNNEL RELEASE    . CHOLECYSTECTOMY    . ELBOW SURGERY  08/2015   Total of 3 surgeries  . REPLACEMENT TOTAL KNEE Bilateral    Right TKA 2003, left TKA 2010    . TOTAL KNEE REVISION Right 12/15/2016   Procedure: RIGHT TOTAL KNEE REVISION;  Surgeon: Gaynelle Arabian, MD;  Location: [facility];  Service: Orthopedics;   Laterality: Right;   HPI:  Pt is an 84 y.o. male with a PMH of afib; myasthenia gravis; hypothyroidism; HLD; hand paresthesias related to C-spine disease; glaucoma; HTN; and COPD who presents with SOB and recurrent respiratory illness since December. CT Chest showed LLL consolidation (which may be persistent or recurrent), RLL opacities, and RUL nodular opacity. BSE in December 2018 recommended regular diet and thin liquids. At that time pt shared that he had a h/o dysphagia after initial MG dx requiring feeding tube and then thickened liquids, but that he is able to eat/drink regular consistencies if he leans forward during eating/drinking to swallow more safely. This was observed to reduce coughing during this eval, which was suspected to be related to improved oral containment/timing of swallow.   Assessment / Plan / Recommendation Clinical Impression  Pt has descriptive reports of dysphagia, including intermittent nasal regurgitation of pills and coughing with PO intake, and acknowledges his remote difficulty requiring NGT and thickened liquids s/p initial dx of MG. However, he is resistent to any swallow testing. He was agreeable for skilled observation during med administration, with immediate coughing concerning for aspiration that occurred following large straw sips of thin liquid. Smaller sips, even with pills, do not elicit the same response. SLP recommended pursuing updated testing of current swallow function particularly in light of the above as well as pt report of having 5 episodes of PNA over the last 5 months.  Despite education, pt is clear that he does not want to have any further testing done. He also says that he would not be open to any diet modifications or compensatory strategies because he has "already done that." Discussed with MD. Please reorder SLP if pt is more open to dysphagia assessment/treatment. SLP Visit Diagnosis: Dysphagia, unspecified (R13.10)    Aspiration Risk   Moderate aspiration risk    Diet Recommendation Other (Comment)(defer to MD)        Other  Recommendations Oral Care Recommendations: Oral care QID(frequent oral care due to concern for aspiration)   Follow up Recommendations None      Frequency and Duration            Prognosis Prognosis for Safe Diet Advancement: Fair Barriers to Reach Goals: Time post onset      Swallow Study   General HPI: Pt is an 84 y.o. male with a PMH of afib; myasthenia gravis; hypothyroidism; HLD; hand paresthesias related to C-spine disease; glaucoma; HTN; and COPD who presents with SOB and recurrent respiratory illness since December. CT Chest showed LLL consolidation (which may be persistent or recurrent), RLL opacities, and RUL nodular opacity. BSE in December 2018 recommended regular diet and thin liquids. At that time pt shared that he had a h/o dysphagia after initial MG dx requiring feeding tube and then thickened liquids, but that he is able to eat/drink regular consistencies if he leans forward during eating/drinking to swallow more safely. This was observed to reduce coughing during this eval, which was suspected to be related to improved oral containment/timing of swallow. Type of Study: Bedside Swallow Evaluation Previous Swallow Assessment: see HPI Diet Prior to this Study: Regular;Thin liquids Temperature Spikes Noted: No Respiratory Status: Nasal cannula History of Recent Intubation: No Behavior/Cognition: Alert;Cooperative Oral Care Completed by SLP: No Oral Cavity - Dentition: Missing dentition Vision: Functional for self-feeding Self-Feeding Abilities: Needs assist Patient Positioning: Upright in bed Baseline Vocal Quality: Normal Volitional Cough: Strong    Oral/Motor/Sensory Function     Ice Chips Ice chips: Not tested   Thin Liquid Thin Liquid: Impaired Presentation: Straw Pharyngeal  Phase Impairments: Cough - Immediate    Nectar Thick Nectar Thick Liquid: Not tested    Honey Thick Honey Thick Liquid: Not tested   Puree Puree: Not tested   Solid   GO   Solid: Not tested        Germain Osgood 08/16/2017,10:08 AM  Germain Osgood, M.A. CCC-SLP [phone]

## 2017-08-16 NOTE — Progress Notes (Signed)
Initial Nutrition Assessment  DOCUMENTATION CODES:   Not applicable  INTERVENTION:    Continue Heart Healthy diet  NUTRITION DIAGNOSIS:   Increased nutrient needs related to chronic illness, wound healing as evidenced by estimated needs  GOAL:   Patient will meet greater than or equal to 90% of their needs  MONITOR:   PO intake, Labs, Skin, Weight trends, I & O's  REASON FOR ASSESSMENT:   Consult Assessment of nutrition requirement/status  ASSESSMENT:   84 y.o. Male with PMH of afib; myasthenia gravis; hypothyroidism; HLD; hand paresthesias related to C-spine disease; glaucoma; HTN; and COPD who presents with SOB and recurrent respiratory illness since December.  RD spoke with pt at bedside. He is watching TV. Reports his appetite is good but states "the food hasn't been the greatest". Pt was residing at a senior living community [facility]. He reveals the food was "terrible".  S/p bedside swallow evaluation today. Diet deferred to MD. Lucia Gaskins his daughter has Ensure in her refrigerator but he doesn't like it. He has lost weight (27 lbs) since his surgery in 01/2017. Significant for time frame.  Labs reviewed. Mg 1.4 (L). K 3.4 (L). Medications reviewed. CBG's 212-121-113.  NUTRITION - FOCUSED PHYSICAL EXAM:  Completed. No muscle or fat depletions noticed.  Diet Order:   Diet Order           Diet Heart Room service appropriate? Yes; Fluid consistency: Thin  Diet effective now         EDUCATION NEEDS:   No education needs have been identified at this time  Skin:  Skin Assessment: Skin Integrity Issues: Skin Integrity Issues:: Other (Comment) Other: MASD to sacral area; chronic full thickness wound to L leg  Last BM:  6/18  Height:   Ht Readings from Last 1 Encounters:  08/15/17 6\' 1"  (1.854 m)   Weight:   Wt Readings from Last 1 Encounters:  08/16/17 185 lb (83.9 kg)   BMI:  Body mass index is 24.41 kg/m.  Estimated Nutritional Needs:   Kcal:   1800-1950  Protein:  90-105 gm  Fluid:  1.8-1.9 L  Arthur Holms, RD, LDN Pager #: [phone] After-Hours Pager #: [phone]

## 2017-08-16 NOTE — Progress Notes (Signed)
  Echocardiogram 2D Echocardiogram has been performed.  Hisako Bugh G Krisanne Lich 08/16/2017, 3:05 PM

## 2017-08-17 LAB — COMPREHENSIVE METABOLIC PANEL
ALBUMIN: 2.7 g/dL — AB (ref 3.5–5.0)
ALT: 15 U/L — AB (ref 17–63)
AST: 25 U/L (ref 15–41)
Alkaline Phosphatase: 112 U/L (ref 38–126)
Anion gap: 10 (ref 5–15)
BILIRUBIN TOTAL: 1 mg/dL (ref 0.3–1.2)
BUN: 23 mg/dL — AB (ref 6–20)
CHLORIDE: 103 mmol/L (ref 101–111)
CO2: 29 mmol/L (ref 22–32)
CREATININE: 0.98 mg/dL (ref 0.61–1.24)
Calcium: 8.4 mg/dL — ABNORMAL LOW (ref 8.9–10.3)
GFR calc Af Amer: >60 mL/min (ref >60)
GLUCOSE: 109 mg/dL — AB (ref 65–99)
Potassium: 3.7 mmol/L (ref 3.5–5.1)
Sodium: 142 mmol/L (ref 135–145)
TOTAL PROTEIN: 5.8 g/dL — AB (ref 6.5–8.1)

## 2017-08-17 LAB — CBC
HEMATOCRIT: 33.3 % — AB (ref 39.0–52.0)
Hemoglobin: 9.8 g/dL — ABNORMAL LOW (ref 13.0–17.0)
MCH: 26.6 pg (ref 26.0–34.0)
MCHC: 29.4 g/dL — ABNORMAL LOW (ref 30.0–36.0)
MCV: 90.2 fL (ref 78.0–100.0)
Platelets: 170 10*3/uL (ref 150–400)
RBC: 3.69 MIL/uL — AB (ref 4.22–5.81)
RDW: 16.8 % — ABNORMAL HIGH (ref 11.5–15.5)
WBC: 12.4 10*3/uL — AB (ref 4.0–10.5)

## 2017-08-17 LAB — GLUCOSE, CAPILLARY: Glucose-Capillary: 106 mg/dL — ABNORMAL HIGH (ref 65–99)

## 2017-08-17 LAB — MAGNESIUM: MAGNESIUM: 1.8 mg/dL (ref 1.7–2.4)

## 2017-08-17 NOTE — Evaluation (Signed)
Physical Therapy Evaluation Patient Details Name: Travis Ross MRN: [id number] DOB: 17-Apr-1926 Today's Date: 08/17/2017   History of Present Illness  84 y.o. male with a Past Medical History of afib; myasthenia gravis; hypothyroidism; HLD; hand paresthesias related to C-spine disease; glaucoma; HTN; and COPD who presents with SOB and recurrent respiratory illness since December.  He had lower extremity edema on exam at presentation concerning for HF exacerbation and had CT chest with findings c/w pneumonia.   Clinical Impression  PTA pt was living in an assisted living facility and was supervision for bed mobility and able to assist in pivot transfers from bed to w/c or BSC, and required assist for bathing and dressing. Pt limited today by DoE and increased HR with mobilization as well as decrease in strength from his baseline. Pt currently minA for bed mobility and further activity was not attempted due to increase in HR and DoE. PT recommends SNF level rehab at d/c to return pt to PLOF so that he can continue to live at his ALF and participate in his safe mobility. PT will continue to follow acutely.      Follow Up Recommendations SNF    Equipment Recommendations  None recommended by PT    Recommendations for Other Services       Precautions / Restrictions Precautions Precautions: Fall      Mobility  Bed Mobility Overal bed mobility: Needs Assistance Bed Mobility: Rolling;Supine to Sit;Sit to Supine Rolling: Min assist   Supine to sit: Min assist;+2 for safety/equipment;HOB elevated Sit to supine: Min assist   General bed mobility comments: minA for hand to pull against, and for pad scoot of hips to EoB, with sit>supine required minA for management of LE over bedrail into bed. minA for guiding hand to bed rail for pt to pull over into sidelying for bed pan placement  Transfers                 General transfer comment: deferred secondary to increased HR sitting EoB        Balance Overall balance assessment: Needs assistance Sitting-balance support: No upper extremity supported;Feet supported Sitting balance-Leahy Scale: Fair Sitting balance - Comments: able to sit EoB for 5 minutes before needing to use bed pan                                     Pertinent Vitals/Pain Pain Assessment: Faces Faces Pain Scale: No hurt    Home Living Family/patient expects to be discharged to:: Assisted living               Home Equipment: Wheelchair - manual      Prior Function Level of Independence: Needs assistance   Gait / Transfers Assistance Needed: receives assist for squat pivot transfer to wheelchair/BSC. Pt needs his shoes to provide enough traction so they do not slip   ADL's / Homemaking Assistance Needed: Assist with bathing and dressing           Extremity/Trunk Assessment   Upper Extremity Assessment Upper Extremity Assessment: RUE deficits/detail;LUE deficits/detail RUE Deficits / Details: lacking shoulder flexion, reports rotator cuff tear RUE Sensation: decreased light touch(paresthesia from C3 injury) RUE Coordination: decreased fine motor LUE Deficits / Details: lacks shoulder flexion, elbow flex/ext WFL,  LUE Sensation: decreased light touch(paresthesia from C3 injury) LUE Coordination: decreased fine motor    Lower Extremity Assessment Lower Extremity Assessment: RLE deficits/detail;LLE deficits/detail RLE  Deficits / Details: R hip ROM WFL, able to lift against gravity, knee with limited ROM from R knee periorthotic break, ankle lacks dorsi/plantar flexion  RLE Sensation: decreased light touch;history of peripheral neuropathy(lacks sensation below knee) RLE Coordination: decreased fine motor;decreased gross motor LLE Deficits / Details: L hip ROM WFL, knee lacks full flex/ext, unable to move ankle, hip strength grossly 4/5, knee strength grossly 3/5 LLE Sensation: decreased light touch;history of peripheral  neuropathy(decreased sensation below knee) LLE Coordination: decreased fine motor;decreased gross motor    Cervical / Trunk Assessment Cervical / Trunk Assessment: Kyphotic  Communication   Communication: No difficulties  Cognition Arousal/Alertness: Awake/alert Behavior During Therapy: WFL for tasks assessed/performed Overall Cognitive Status: Within Functional Limits for tasks assessed                                        General Comments General comments (skin integrity, edema, etc.): pt on 2L O2 via nasal cannula, at rest SaO2 98%O2, HR 101 bpm, with sitting EoB HR increased to max 145 bpm, telemonitor indicated Afib, SaO2 91%O2        Assessment/Plan    PT Assessment Patient needs continued PT services  PT Problem List Decreased strength;Decreased range of motion;Decreased activity tolerance;Decreased balance;Decreased mobility;Decreased coordination;Cardiopulmonary status limiting activity;Impaired sensation       PT Treatment Interventions DME instruction;Functional mobility training;Therapeutic activities;Therapeutic exercise;Balance training;Patient/family education    PT Goals (Current goals can be found in the Care Plan section)  Acute Rehab PT Goals Patient Stated Goal: none stated PT Goal Formulation: With patient Time For Goal Achievement: 08/17/17 Potential to Achieve Goals: Fair    Frequency Min 2X/week    AM-PAC PT "6 Clicks" Daily Activity  Outcome Measure Difficulty turning over in bed (including adjusting bedclothes, sheets and blankets)?: Unable Difficulty moving from lying on back to sitting on the side of the bed? : Unable Difficulty sitting down on and standing up from a chair with arms (e.g., wheelchair, bedside commode, etc,.)?: Unable Help needed moving to and from a bed to chair (including a wheelchair)?: Total Help needed walking in hospital room?: Total Help needed climbing 3-5 steps with a railing? : Total 6 Click Score:  6    End of Session Equipment Utilized During Treatment: Oxygen Activity Tolerance: Treatment limited secondary to medical complications (Comment) Patient left: in bed;with call bell/phone within reach;with bed alarm set;with nursing/sitter in room Nurse Communication: Mobility status;Need for lift equipment PT Visit Diagnosis: Other abnormalities of gait and mobility (R26.89);Muscle weakness (generalized) (M62.81);Repeated falls (R29.6);History of falling (Z91.81);Difficulty in walking, not elsewhere classified (R26.2);Other symptoms and signs involving the nervous system (R29.898)    Time: 6160-7371 PT Time Calculation (min) (ACUTE ONLY): 28 min   Charges:   PT Evaluation $PT Eval High Complexity: 1 High PT Treatments $Therapeutic Activity: 8-22 mins   PT G Codes:        Elexa Kivi B. Migdalia Dk PT, DPT Acute Rehabilitation  [phone] Pager [phone]    [place] 08/17/2017, 2:35 PM

## 2017-08-17 NOTE — NC FL2 (Signed)
[place] MEDICAID FL2 LEVEL OF CARE SCREENING TOOL     IDENTIFICATION  Patient Name: Travis Ross Birthdate: 26-Nov-1926 Sex: male Admission Date (Current Location): 08/15/2017  [place] and [place] Number:  Herbalist and Address:  [facility], [place] [address] [place], [place] [address]      Provider Number: [id number]  Attending Physician Name and Address:  Elodia Florence., *  Relative Name and Phone Number:       Current Level of Care: Hospital Recommended Level of Care: [place] Prior Approval Number:    Date Approved/Denied:   PASRR Number: [id number]  Discharge Plan: SNF    Current Diagnoses: Patient Active Problem List   Diagnosis Date Noted  . Acute respiratory failure with hypoxia ([place]) 08/15/2017  . Hyperlipidemia 08/15/2017  . COPD with chronic bronchitis and emphysema ([place]) 08/15/2017  . GERD (gastroesophageal reflux disease) 08/15/2017  . Hypothyroidism (acquired) 08/15/2017  . History of Cervical spinal cord compression ([facility]) 08/15/2017  . Myasthenia gravis ([place]) 08/15/2017  . Acute on chronic systolic heart failure ([place]) 08/15/2017  . Acute hypokalemia 08/15/2017  . Decubitus ulcer of sacral region, stage 1 08/15/2017  . Pressure injury of sacral region, stage 2 02/06/2017  . Acute on chronic systolic (congestive) heart failure ([place]) 02/06/2017  . Community acquired pneumonia 02/04/2017  . A-fib ([place])   . Periprosthetic fracture around internal prosthetic right knee joint 12/15/2016  . Failed total knee arthroplasty ([place]) 12/15/2016  . Permanent atrial fibrillation ([place]) 12/05/2016  . Pre-operative cardiovascular examination 12/05/2016  . Venous stasis of both lower extremities 12/05/2016  . Essential hypertension     Orientation RESPIRATION BLADDER Height & Weight     Self, Time, Situation, Place  O2(2L) Incontinent, External catheter Weight: 207 lb (93.9 kg) Height:  6\' 1"  (185.4 cm)   BEHAVIORAL SYMPTOMS/MOOD NEUROLOGICAL BOWEL NUTRITION STATUS      Continent Diet(cardiac)  AMBULATORY STATUS COMMUNICATION OF NEEDS Skin   Extensive Assist Verbally PU Stage and Appropriate Care PU Stage 1 Dressing: (on sacrum- foam dressing)   PU Stage 3 Dressing: (on leg foam dressing)                 Personal Care Assistance Level of Assistance  Bathing, Dressing Bathing Assistance: Maximum assistance   Dressing Assistance: Maximum assistance     Functional Limitations Info             SPECIAL CARE FACTORS FREQUENCY  PT (By licensed PT), OT (By licensed OT)     PT Frequency: 5/wk OT Frequency: 5/wk            Contractures      Additional Factors Info  Code Status, Allergies, Psychotropic Code Status Info: DNR Allergies Info: NKA Psychotropic Info: zoloft         Current Medications (08/17/2017):  This is the current hospital active medication list Current Facility-Administered Medications  Medication Dose Route Frequency Provider Last Rate Last Dose  . acetaminophen (TYLENOL) tablet 650 mg  650 mg Oral Q6H PRN Samella Parr, NP       Or  . acetaminophen (TYLENOL) suppository 650 mg  650 mg Rectal Q6H PRN Samella Parr, NP      . acetylcysteine (MUCOMYST) 20 % nebulizer / oral solution 3 mL  3 mL Nebulization TID Karmen Bongo, MD   3 mL at 08/17/17 1514  . apixaban (ELIQUIS) tablet 5 mg  5 mg Oral BID Samella Parr, NP  5 mg at 08/17/17 0934  . aspirin EC tablet 81 mg  81 mg Oral Daily Samella Parr, NP   81 mg at 08/17/17 0934  . atorvastatin (LIPITOR) tablet 10 mg  10 mg Oral QPM Samella Parr, NP   10 mg at 08/16/17 1650  . azaTHIOprine (IMURAN) tablet 50 mg  50 mg Oral BID Samella Parr, NP   50 mg at 08/17/17 0935  . ceFEPIme (MAXIPIME) 2 g in sodium chloride 0.9 % 100 mL IVPB  2 g Intravenous Q12H Alvira Philips, [place]   Stopped at 08/17/17 7106  . docusate sodium (COLACE) capsule 100 mg  100 mg Oral BID Samella Parr, NP    100 mg at 08/17/17 0934  . fluticasone (FLONASE) 50 MCG/ACT nasal spray 1 spray  1 spray Each Nare Daily Samella Parr, NP   1 spray at 08/17/17 0933  . furosemide (LASIX) injection 60 mg  60 mg Intravenous BID Elodia Florence., MD   60 mg at 08/17/17 1332  . ipratropium-albuterol (DUONEB) 0.5-2.5 (3) MG/3ML nebulizer solution 3 mL  3 mL Nebulization Q6H Karmen Bongo, MD   3 mL at 08/17/17 1513  . latanoprost (XALATAN) 0.005 % ophthalmic solution 1 drop  1 drop Both Eyes QHS Samella Parr, NP   1 drop at 08/16/17 2050  . levalbuterol (XOPENEX) nebulizer solution 0.63 mg  0.63 mg Nebulization Q6H PRN Elodia Florence., MD      . levothyroxine (SYNTHROID, LEVOTHROID) tablet 200 mcg  200 mcg Oral QAC breakfast Samella Parr, NP   200 mcg at 08/17/17 2694  . magnesium oxide (MAG-OX) tablet 400 mg  400 mg Oral BID Elodia Florence., MD   400 mg at 08/17/17 0935  . metoprolol tartrate (LOPRESSOR) tablet 25 mg  25 mg Oral Q6H Elodia Florence., MD   25 mg at 08/17/17 1053  . mometasone-formoterol (DULERA) 100-5 MCG/ACT inhaler 2 puff  2 puff Inhalation BID Samella Parr, NP   2 puff at 08/17/17 0843  . ondansetron (ZOFRAN) tablet 4 mg  4 mg Oral Q6H PRN Samella Parr, NP       Or  . ondansetron [facility]) injection 4 mg  4 mg Intravenous Q6H PRN Samella Parr, NP      . oxyCODONE (OXYCONTIN) 12 hr tablet 30 mg  30 mg Oral Q12H Samella Parr, NP   30 mg at 08/17/17 0835  . pantoprazole (PROTONIX) EC tablet 40 mg  40 mg Oral BID Samella Parr, NP   40 mg at 08/17/17 0933  . potassium chloride 20 MEQ/15ML (10%) solution 20 mEq  20 mEq Oral BID Samella Parr, NP   20 mEq at 08/17/17 0931  . predniSONE (DELTASONE) tablet 5 mg  5 mg Oral Q breakfast Samella Parr, NP   5 mg at 08/17/17 0835  . psyllium (HYDROCIL/METAMUCIL) packet 1 packet  1 packet Oral QHS Samella Parr, NP   1 packet at 08/16/17 2042  . senna (SENOKOT) tablet 8.6 mg  1 tablet Oral QPM  Erin Hearing L, NP   8.6 mg at 08/16/17 1650  . sertraline (ZOLOFT) tablet 150 mg  150 mg Oral Daily Samella Parr, NP   150 mg at 08/17/17 0933  . sodium chloride flush (NS) 0.9 % injection 3 mL  3 mL Intravenous Q12H Samella Parr, NP   3 mL at 08/17/17 0932  . temazepam (RESTORIL)  capsule 30 mg  30 mg Oral QHS Samella Parr, NP   30 mg at 08/16/17 2043     Discharge Medications: Please see discharge summary for a list of discharge medications.  Relevant Imaging Results:  Relevant Lab Results:   Additional Information SS#: [id number]  Jorge Ny, LCSW

## 2017-08-17 NOTE — Progress Notes (Addendum)
PROGRESS NOTE    Travis Ross  DGU:[id number] DOB: [birth year] DOA: 08/15/2017 PCP: [name], MD   Brief Narrative:  84 y.o. male with Raysean Graumann Past Medical History of afib; myasthenia gravis; hypothyroidism; HLD; hand paresthesias related to C-spine disease; glaucoma; HTN; and COPD who presents with SOB and recurrent respiratory illness since December.  He had lower extremity edema on exam at presentation concerning for HF exacerbation and had CT chest with findings c/w pneumonia.   Assessment & Plan:   Principal Problem:   Acute respiratory failure with hypoxia (HCC) Active Problems:   Hyperlipidemia   COPD with chronic bronchitis and emphysema (HCC)   GERD (gastroesophageal reflux disease)   Hypothyroidism (acquired)   History of Cervical spinal cord compression (HCC)   Myasthenia gravis (HCC)   Acute on chronic systolic heart failure (HCC)   Acute hypokalemia   Decubitus ulcer of sacral region, stage 1   Acute respiratory failure with hypoxia  -Patient presents with acute hypoxemic respiratory failure with recurrent episodes of dyspnea and shortness of breath previously treated as community-acquired pneumonia in the outpatient setting (6x since Oct 2018) - Possibly multifactorial with pneumonia on CT, but notable LE edema on exam which could be c/w HF exacerbation -Continue supportive care with oxygen and wean as tolerated noting was on RA prior to current symptoms  Pneumonia: pt with LLL and RLL opacities as well as RUL nodular opacity on imaging.   -Obtain ESR (elevated), respiratory procalcitonin (normal), blood cultures (NGTD), urinary strep (negative), sputum culture (pending collection) - MRSA PCR negative - CT chest recommending f/u 8-12 weeks  - Continue cefepime, stop vancomycin  COPD with chronic bronchitis and emphysema  -Pt with transmitted upper airway sounds, don't think this is c/w COPD exacerbation -Abx for pneumonia as noted above -scheduled nebs, xopenex  prn -Continue Dulera and Combivent for now  -Continue chronic prednisone 10 mg daily for now (pt taking 5 mg daily - this is ordered)    Myasthenia gravis  - He notes this has been stable with no exacerbation in about 2 years - Continue Imuran - avoid meds that could exacerbate MG  Dysphagia - pt declined further evaluation by speech at this time and noted he would not be open to any dietary modifications at this time.  Discussed with him at the bedside and he repeated this.  He was consistent today with repeat discussion about why we were interested in this with his recurrent pneumonias and concern for aspiration, but he again declined further evaluation.    Acute on chronic systolic heart failure  - Elevated BNP, lower extremity edema - Continue lasix 60 IV BID for now - Repeat echo 6/19 with EF 40-45%, unable to eval LV diastolic function, mod increased PASP (see report) - improved from 01/2017 where EF was 30-35% -Continue beta blocker, currently holding arb -Daily weights, strict I's/O  -Hypoalbuminemia.  Follow UA (neg for protein).   -Patient's peak weight during hospitalization in December 2018 was 210 lbs and weight at admission 182 lbs (current weight 207, weights seem to be inaccurate, will repeat) -Patient dry weight unknown and patient reports at facility is given Averi Kilty diet high in sugar and salt -Consider cardiology consultation as needed  Wt Readings from Last 3 Encounters:  08/17/17 93.9 kg (207 lb)  02/08/17 94.3 kg (207 lb 14.3 oz)  12/15/16 86.2 kg (190 lb)      Acute hypokalemia  Hypomag -Continue 20 meq BID - magnesium improved, continue PO supp -Follow electrolytes  Chronic atrial fibrillation with RVR -with intermittent RVR, will increase metop to 25 mg q6 and follow (RVR improved) -eliquis    Decubitus ulcer of sacral region, stage 1 -Air mattress -Wound care consult -Albumin low-nutritional consultation  Left Tibial Wound: - occurred after  skin procedure Elzora Cullins couple months ago - wound care per wound care c/s, appreciate recs - outpatient follow up    Hyperlipidemia -Continue Lipitor    GERD (gastroesophageal reflux disease) -Continue Protonix    Hypothyroidism (acquired) -Continue Synthroid -TSH (wnl)    History of Cervical spinal cord compression  -Patient has chronic paresthesias making it difficult to perform any ADLs including something as simple as holding urinal -Condom catheter   Anemia: chronic, follow  Thrombocytopenia: improved  Leukocytosis: 2/2 above, follow  DVT prophylaxis: eliquis Code Status: DNR Family Communication: declined me calling any family members today, stated his daughters were here yesterday and understood what was going on Disposition Plan: pending improvement   Consultants:   none  Procedures:   none  Antimicrobials: Anti-infectives (From admission, onward)   Start     Dose/Rate Route Frequency Ordered Stop   08/16/17 1800  azithromycin (ZITHROMAX) 500 mg in sodium chloride 0.9 % 250 mL IVPB  Status:  Discontinued     500 mg 250 mL/hr over 60 Minutes Intravenous Every 24 hours 08/15/17 1700 08/15/17 1732   08/16/17 0630  vancomycin (VANCOCIN) IVPB 1000 mg/200 mL premix  Status:  Discontinued     1,000 mg 200 mL/hr over 60 Minutes Intravenous Every 12 hours 08/15/17 1750 08/17/17 0759   08/16/17 0000  cefTRIAXone (ROCEPHIN) 1 g in sodium chloride 0.9 % 100 mL IVPB  Status:  Discontinued     1 g 200 mL/hr over 30 Minutes Intravenous Every 24 hours 08/15/17 1700 08/15/17 1732   08/15/17 1900  ceFEPIme (MAXIPIME) 2 g in sodium chloride 0.9 % 100 mL IVPB     2 g 200 mL/hr over 30 Minutes Intravenous Every 12 hours 08/15/17 1750     08/15/17 1830  vancomycin (VANCOCIN) 1,500 mg in sodium chloride 0.9 % 500 mL IVPB     1,500 mg 250 mL/hr over 120 Minutes Intravenous  Once 08/15/17 1750 08/16/17 0909   08/15/17 1545  cefTRIAXone (ROCEPHIN) 1 g in sodium chloride 0.9 % 100 mL  IVPB     1 g 200 mL/hr over 30 Minutes Intravenous  Once 08/15/17 1537 08/15/17 1638   08/15/17 1545  azithromycin (ZITHROMAX) 500 mg in sodium chloride 0.9 % 250 mL IVPB     500 mg 250 mL/hr over 60 Minutes Intravenous  Once 08/15/17 1537 08/15/17 1911     Subjective: Persistent congestion and cough. Edema is better.  Objective: Vitals:   08/16/17 2100 08/17/17 0213 08/17/17 0600 08/17/17 0743  BP:    (!) 141/107  Pulse: (!) 108 86  83  Resp: '20 20  18  '$ Temp:    97.6 F (36.4 C)  TempSrc:    Oral  SpO2: 97% 98%  97%  Weight:   93.9 kg (207 lb)   Height:        Intake/Output Summary (Last 24 hours) at 08/17/2017 0800 Last data filed at 08/17/2017 0500 Gross per 24 hour  Intake 400 ml  Output 1350 ml  Net -950 ml   Filed Weights   08/15/17 1154 08/16/17 0423 08/17/17 0600  Weight: 82.6 kg (182 lb) 83.9 kg (185 lb) 93.9 kg (207 lb)    Examination:  General: No acute distress. Cardiovascular:  Heart sounds show Tavius Turgeon regular rate, and rhythm. No gallops or rubs. No murmurs. No JVD. Lungs:  Transmitted upper airway sounds.  Slightly increased wob.  Diffuse rhonchi Abdomen: Soft, nontender, nondistended  Neurological: Alert and oriented 3. Moves all extremities 4. Cranial nerves II through XII grossly intact. Skin: L tibial ulceration, sacrum not examined today Extremities: No clubbing or cyanosis. Trace edema.  2+ DP pulses. Psychiatric: Mood and affect are normal. Insight and judgment are appropriate.   Data Reviewed: I have personally reviewed following labs and imaging studies  CBC: Recent Labs  Lab 08/15/17 1229 08/16/17 0510 08/17/17 0350  WBC 10.6* 7.3 12.4*  HGB 10.1* 9.2* 9.8*  HCT 33.4* 30.7* 33.3*  MCV 89.3 88.7 90.2  PLT 176 142* 182   Basic Metabolic Panel: Recent Labs  Lab 08/15/17 1229 08/16/17 0510 08/16/17 1100 08/17/17 0350  NA 140 141  --  142  K 2.8* 3.4*  --  3.7  CL 103 107  --  103  CO2 28 23  --  29  GLUCOSE 120* 132*  --  109*    BUN 16 20  --  23*  CREATININE 0.70 0.76  --  0.98  CALCIUM 8.2* 7.8*  --  8.4*  MG  --   --  1.4* 1.8   GFR: Estimated Creatinine Clearance: 58.9 mL/min (by C-G formula based on SCr of 0.98 mg/dL). Liver Function Tests: Recent Labs  Lab 08/15/17 1229 08/17/17 0350  AST 22 25  ALT 12* 15*  ALKPHOS 111 112  BILITOT 1.5* 1.0  PROT 6.2* 5.8*  ALBUMIN 2.8* 2.7*   No results for input(s): LIPASE, AMYLASE in the last 168 hours. No results for input(s): AMMONIA in the last 168 hours. Coagulation Profile: No results for input(s): INR, PROTIME in the last 168 hours. Cardiac Enzymes: No results for input(s): CKTOTAL, CKMB, CKMBINDEX, TROPONINI in the last 168 hours. BNP (last 3 results) No results for input(s): PROBNP in the last 8760 hours. HbA1C: No results for input(s): HGBA1C in the last 72 hours. CBG: Recent Labs  Lab 08/15/17 1933 08/15/17 2120 08/16/17 0752 08/16/17 1139  GLUCAP 245* 212* 121* 113*   Lipid Profile: No results for input(s): CHOL, HDL, LDLCALC, TRIG, CHOLHDL, LDLDIRECT in the last 72 hours. Thyroid Function Tests: Recent Labs    08/15/17 1618  TSH 1.000   Anemia Panel: No results for input(s): VITAMINB12, FOLATE, FERRITIN, TIBC, IRON, RETICCTPCT in the last 72 hours. Sepsis Labs: Recent Labs  Lab 08/15/17 1618  PROCALCITON <0.10    Recent Results (from the past 240 hour(s))  Culture, blood (routine x 2) Call MD if unable to obtain prior to antibiotics being given     Status: None (Preliminary result)   Collection Time: 08/15/17  4:18 PM  Result Value Ref Range Status   Specimen Description BLOOD RIGHT HAND  Final   Special Requests   Final    BOTTLES DRAWN AEROBIC AND ANAEROBIC Blood Culture adequate volume Performed at [facility], [address] [place], [place] [address]    Culture NO GROWTH < 24 HOURS  Final   Report Status PENDING  Incomplete  MRSA PCR Screening     Status: None   Collection Time: 08/16/17  6:41 PM  Result  Value Ref Range Status   MRSA by PCR NEGATIVE NEGATIVE Final    Comment:        The GeneXpert MRSA Assay (FDA approved for NASAL specimens only), is one component of Milka Windholz comprehensive  MRSA colonization surveillance program. It is not intended to diagnose MRSA infection nor to guide or monitor treatment for MRSA infections. Performed at [facility], [place] [address] [place], [place] [address]          Radiology Studies: Dg Chest 2 View  Result Date: 08/15/2017 CLINICAL DATA:  Short of breath, wheezing, cough for Raequan Vanschaick week, history of recent pneumonia EXAM: CHEST - 2 VIEW COMPARISON:  CT chest of 02/04/2017 and chest x-ray of the same day FINDINGS: The lungs are not as well aerated but the right basilar pneumonia has cleared. However, as Ngoc Detjen result of the poor inspiration, is difficult to exclude pneumonia remaining at the left lung base in the right mid upper lung. Olivier Frayre tiny effusion may be present blunting the costophrenic angles on the lateral view. Cardiomegaly is stable. No bony abnormality is seen. There are degenerative changes both shoulders. IMPRESSION: 1. Poor inspiration but clearing of the right basilar pneumonia. Otherwise patchy pneumonia in the right mid lung and left lung base cannot be excluded. 2. Stable cardiomegaly. Electronically Signed   By: Ivar Drape M.D.   On: 08/15/2017 13:40   Ct Chest Wo Contrast  Result Date: 08/15/2017 CLINICAL DATA:  84 year old male with productive cough for 2 days and shortness of breath for 4 months. EXAM: CT CHEST WITHOUT CONTRAST TECHNIQUE: Multidetector CT imaging of the chest was performed following the standard protocol without IV contrast. COMPARISON:  08/15/2017 and prior chest radiographs. 02/04/2017 chest CT FINDINGS: Cardiovascular: Cardiomegaly again noted. Coronary artery and thoracic aortic atherosclerotic calcifications again identified. No thoracic aortic aneurysm or pericardial effusion. Mediastinum/Nodes: Unchanged mediastinal  lymph nodes with index 11 mm RIGHT paratracheal node (series 3: Image 33) and 6 mm AP window node (3:54). No new or enlarging lymph nodes within the chest. No mediastinal mass. The visualized thyroid gland and esophagus are unremarkable. Lungs/Pleura: Consolidation of the LEFT LOWER lobe again noted which may be persistent or recurrent. Patchy ill-defined opacities within the lingula and RIGHT LOWER lobe noted. Clotilde Loth new 1 x 2.3 cm slightly irregular RIGHT UPPER lobe nodular opacity (4:51) is identified. Very small bilateral pleural effusions are noted with mild bibasilar atelectasis. There is no evidence of pneumothorax. Upper Abdomen: No acute abnormality Musculoskeletal: No acute abnormality or suspicious bony lesion. Bridging syndesmophytes/DISH changes throughout the thoracic spine again noted. IMPRESSION: 1. LEFT LOWER lobe consolidation, scattered patchy lingular and RIGHT LOWER lobe opacities and new 1 x 2.3 cm RIGHT UPPER lobe nodular opacity. Given that some of these opacities are likely infectious/pneumonia, recommend CT follow-up in 8-12 weeks following appropriate antibiotic therapy. 2. Very small bilateral pleural effusions. 3. Cardiomegaly 4. Coronary artery and Aortic Atherosclerosis (ICD10-I70.0). Electronically Signed   By: Margarette Canada M.D.   On: 08/15/2017 17:27        Scheduled Meds: . acetylcysteine  3 mL Nebulization TID  . apixaban  5 mg Oral BID  . aspirin EC  81 mg Oral Daily  . atorvastatin  10 mg Oral QPM  . azaTHIOprine  50 mg Oral BID  . docusate sodium  100 mg Oral BID  . fluticasone  1 spray Each Nare Daily  . furosemide  60 mg Intravenous BID  . ipratropium-albuterol  3 mL Nebulization Q6H  . latanoprost  1 drop Both Eyes QHS  . levothyroxine  200 mcg Oral QAC breakfast  . magnesium oxide  400 mg Oral BID  . metoprolol tartrate  25 mg Oral Q6H  . mometasone-formoterol  2 puff  Inhalation BID  . oxyCODONE  30 mg Oral Q12H  . pantoprazole  40 mg Oral BID  . potassium  chloride  20 mEq Oral BID  . predniSONE  5 mg Oral Q breakfast  . psyllium  1 packet Oral QHS  . senna  1 tablet Oral QPM  . sertraline  150 mg Oral Daily  . sodium chloride flush  3 mL Intravenous Q12H  . temazepam  30 mg Oral QHS   Continuous Infusions: . ceFEPime (MAXIPIME) IV 2 g (08/17/17 0717)     LOS: 2 days    Time spent: over 71 min    Fayrene Helper, MD [facility] Pager [phone]  If 7PM-7AM, please contact night-coverage [email or web address] Password [email or web address] 08/17/2017, 8:00 AM

## 2017-08-18 LAB — BASIC METABOLIC PANEL
Anion gap: 8 (ref 5–15)
BUN: 22 mg/dL — ABNORMAL HIGH (ref 6–20)
CALCIUM: 8.3 mg/dL — AB (ref 8.9–10.3)
CHLORIDE: 99 mmol/L — AB (ref 101–111)
CO2: 33 mmol/L — AB (ref 22–32)
CREATININE: 0.76 mg/dL (ref 0.61–1.24)
GFR calc Af Amer: >60 mL/min (ref >60)
GFR calc non Af Amer: >60 mL/min (ref >60)
GLUCOSE: 97 mg/dL (ref 65–99)
Potassium: 3.5 mmol/L (ref 3.5–5.1)
Sodium: 140 mmol/L (ref 135–145)

## 2017-08-18 LAB — CBC
HCT: 32.7 % — ABNORMAL LOW (ref 39.0–52.0)
HEMOGLOBIN: 9.7 g/dL — AB (ref 13.0–17.0)
MCH: 26.1 pg (ref 26.0–34.0)
MCHC: 29.7 g/dL — AB (ref 30.0–36.0)
MCV: 88.1 fL (ref 78.0–100.0)
Platelets: 162 10*3/uL (ref 150–400)
RBC: 3.71 MIL/uL — ABNORMAL LOW (ref 4.22–5.81)
RDW: 16.8 % — AB (ref 11.5–15.5)
WBC: 9.4 10*3/uL (ref 4.0–10.5)

## 2017-08-18 LAB — MAGNESIUM: Magnesium: 1.7 mg/dL (ref 1.7–2.4)

## 2017-08-18 MED ORDER — FUROSEMIDE 40 MG PO TABS
40.0000 mg | ORAL_TABLET | Freq: Every day | ORAL | Status: DC
  Administered 2017-08-19 – 2017-08-24 (×6): 40 mg via ORAL
  Filled 2017-08-18 (×6): qty 1

## 2017-08-18 NOTE — Evaluation (Signed)
Occupational Therapy Evaluation Patient Details Name: Travis Ross MRN: [id number] DOB: 1926-12-02 Today's Date: 08/18/2017    History of Present Illness 84 y.o. male with a Past Medical History of afib; myasthenia gravis; hypothyroidism; HLD; hand paresthesias related to C-spine disease; glaucoma; HTN; and COPD who presents with SOB and recurrent respiratory illness since December.  He had lower extremity edema on exam at presentation concerning for HF exacerbation and had CT chest with findings c/w pneumonia.    Clinical Impression   PTA, pt lived at [facility] and required assist for most ADLs, but was able to feed self with adapted utensils and perform simple grooming. Pt has been immobile at facility and required extensive assist as he currently does. No further acute OT indicated at this time, recommend SNF as safest d/c venue for further OT intervention as appsotpriate    Follow Up Recommendations  SNF;Supervision/Assistance - 24 hour    Equipment Recommendations  Other (comment)(TBD at next venue of care)    Recommendations for Other Services       Precautions / Restrictions Precautions Precautions: Fall Restrictions Weight Bearing Restrictions: No      Mobility Bed Mobility                  Transfers                      Balance                                           ADL either performed or assessed with clinical judgement   ADL Overall ADL's : Needs assistance/impaired Eating/Feeding: Minimal assistance;Sitting;Bed level Eating/Feeding Details (indicate cue type and reason): used adapted utensils at facility Grooming: Wash/dry hands;Wash/dry face;Sitting;Bed level;Set up                                 General ADL Comments: pt is total A with bathing, dressing and toileting. Pt requires et up with some assist with self feeding. PTA, pt using adapted utensils at ALF but reports that he has assist from satff to fed  slef as well     Vision         Perception     Praxis      Pertinent Vitals/Pain Pain Assessment: No/denies pain Faces Pain Scale: No hurt     Hand Dominance Right   Extremity/Trunk Assessment Upper Extremity Assessment Upper Extremity Assessment: Generalized weakness;RUE deficits/detail;LUE deficits/detail RUE Deficits / Details: lacking shoulder flexion, reports rotator cuff tear RUE Sensation: decreased light touch RUE Coordination: decreased fine motor LUE Deficits / Details: lacks shoulder flexion, elbow flex/ext WFL,  LUE Sensation: decreased light touch LUE Coordination: decreased fine motor   Lower Extremity Assessment Lower Extremity Assessment: Defer to PT evaluation   Cervical / Trunk Assessment Cervical / Trunk Assessment: Kyphotic   Communication Communication Communication: No difficulties   Cognition Arousal/Alertness: Awake/alert Behavior During Therapy: WFL for tasks assessed/performed Overall Cognitive Status: Within Functional Limits for tasks assessed                                     General Comments       Exercises     Shoulder Instructions      Home  Living Family/patient expects to be discharged to:: Assisted living                             Home Equipment: Wheelchair - manual   Additional Comments: from [facility]      Prior Functioning/Environment Level of Independence: Needs assistance  Gait / Transfers Assistance Needed: receives assist for squat pivot transfer to wheelchair/BSC. Pt needs his shoes to provide enough traction so they do not slip  ADL's / Homemaking Assistance Needed: Assist with bathing, dressing, toileting and self feeding            OT Problem List: Decreased strength;Decreased activity tolerance;Decreased knowledge of use of DME or AE;Impaired tone;Impaired UE functional use;Decreased range of motion;Decreased coordination;Impaired balance (sitting and/or  standing)      OT Treatment/Interventions:      OT Goals(Current goals can be found in the care plan section)    OT Frequency:     Barriers to D/C: Decreased caregiver support          Co-evaluation              AM-PAC PT "6 Clicks" Daily Activity     Outcome Measure Help from another person eating meals?: A Little Help from another person taking care of personal grooming?: A Little Help from another person toileting, which includes using toliet, bedpan, or urinal?: Total Help from another person bathing (including washing, rinsing, drying)?: Total Help from another person to put on and taking off regular upper body clothing?: Total Help from another person to put on and taking off regular lower body clothing?: Total 6 Click Score: 10   End of Session    Activity Tolerance: Patient limited by fatigue Patient left: in bed;with call bell/phone within reach  OT Visit Diagnosis: History of falling (Z91.81);Repeated falls (R29.6);Other symptoms and signs involving the nervous system (R29.898);Muscle weakness (generalized) (M62.81)                Time: 1194-1740 OT Time Calculation (min): 20 min Charges:  OT General Charges $OT Visit: 1 Visit OT Evaluation $OT Eval Moderate Complexity: 1 Mod G-Codes: OT G-codes **NOT FOR INPATIENT CLASS** Functional Assessment Tool Used: AM-PAC 6 Clicks Daily Activity     Travis Ross 08/18/2017, 12:52 PM

## 2017-08-18 NOTE — Clinical Social Work Note (Signed)
Clinical Social Work Assessment  Patient Details  Name: Travis Ross MRN: [id number] Date of Birth: 01-26-1927  Date of referral:  08/18/17               Reason for consult:  Facility Placement                Permission sought to share information with:  Facility Art therapist granted to share information::  Yes, Verbal Permission Granted  Name::        Agency::  SNF  Relationship::     Contact Information:     Housing/Transportation Living arrangements for the past 2 months:  [place] of Information:  Patient Patient Interpreter Needed:  None Criminal Activity/Legal Involvement Pertinent to Current Situation/Hospitalization:  No - Comment as needed Significant Relationships:  Adult Children Lives with:  Facility Resident Do you feel safe going back to the place where you live?    Need for family participation in patient care:     Care giving concerns:  Pt has been staying at [facility] for care.  Pt states he is about at baseline for functioning but he is still interested in looking into a higher level of care.   Social Worker assessment / plan:  CSW spoke with pt regarding PT recommendation for SNF and DC plan.  Patient confirms that he was at [facility]F prior to admission.  Pt states that he was requiring assistance with all mobility there- usually transferred to a wheelchair and got around that way- states he has been about his current level of needs for several months.  CSW discussed possibility of returning to ALF with rehab if they can accept with current mobility limitations or pursuing SNF which patient did not think helped him much last time.  Employment status:  Retired Nurse, adult PT Recommendations:  [place] / Referral to community resources:  [place]  Patient/Family's Response to care: Pt is very unclear about his current plans.   States that he is happy with ALF care then states he is trying to get back home with a full time caregiver.  Pt acknowledges that he is at baseline and has already paid for care but then states he would rather go to SNF despite stating he was not sure it helped him last time...  Patient/Family's Understanding of and Emotional Response to Diagnosis, Current Treatment, and Prognosis:  Pt seems to have good understanding of condition.  Is able to verbalize his impairments and the cause of them (C 5 injury).  Understands that he is not likely to improve with mobility especially in his arms.  Overall pt seems knowledgeable about his condition and prognosis.  Emotional Assessment Appearance:  Appears stated age Attitude/Demeanor/Rapport:    Affect (typically observed):  Appropriate, Pleasant Orientation:  Oriented to Self, Oriented to Place, Oriented to  Time, Oriented to Situation Alcohol / Substance use:  Not Applicable Psych involvement (Current and /or in the community):  No (Comment)  Discharge Needs  Concerns to be addressed:  Care Coordination Readmission within the last 30 days:  No Current discharge risk:  Physical Impairment Barriers to Discharge:  Continued Medical Work up   Jorge Ny, LCSW 08/18/2017, 10:32 AM

## 2017-08-18 NOTE — Progress Notes (Signed)
PROGRESS NOTE    Travis Ross  TXM:[id number] DOB: 1926-06-23 DOA: 08/15/2017 PCP: [name], MD   Brief Narrative:  84 y.o. male with a Past Medical History of afib; myasthenia gravis; hypothyroidism; HLD; hand paresthesias related to C-spine disease; glaucoma; HTN; and COPD who presents with SOB and recurrent respiratory illness since December.  He had lower extremity edema on exam at presentation concerning for HF exacerbation and had CT chest with findings c/w pneumonia.   Assessment & Plan:   Principal Problem:   Acute respiratory failure with hypoxia (HCC) Active Problems:   Hyperlipidemia   COPD with chronic bronchitis and emphysema (HCC)   GERD (gastroesophageal reflux disease)   Hypothyroidism (acquired)   History of Cervical spinal cord compression (HCC)   Myasthenia gravis (HCC)   Acute on chronic systolic heart failure (HCC)   Acute hypokalemia   Decubitus ulcer of sacral region, stage 1   Acute respiratory failure with hypoxia  -Patient presents with acute hypoxemic respiratory failure with recurrent episodes of dyspnea and shortness of breath previously treated as community-acquired pneumonia in the outpatient setting (6x since Oct 2018) - Possibly multifactorial with pneumonia on CT, but notable LE edema on exam which could be c/w HF exacerbation -Continue supportive care with oxygen and wean as tolerated noting was on RA prior to current symptoms  Pneumonia: pt with LLL and RLL opacities as well as RUL nodular opacity on imaging.   -Obtain ESR (elevated), respiratory procalcitonin (normal), blood cultures (NGTD), urinary strep (negative), sputum culture (pending collection) - MRSA PCR negative - CT chest recommending f/u 8-12 weeks  - Continue cefepime (7 days), stop vancomycin  COPD with chronic bronchitis and emphysema  -Pt with transmitted upper airway sounds, don't think this is c/w COPD exacerbation -Abx for pneumonia as noted above -scheduled nebs,  xopenex prn -Continue Dulera and Combivent for now  -Continue chronic prednisone 10 mg daily for now (pt taking 5 mg daily - this is ordered)    Myasthenia gravis  - He notes this has been stable with no exacerbation in about 2 years - Continue Imuran - avoid meds that could exacerbate MG  Dysphagia - pt declined further evaluation by speech at this time and noted he would not be open to any dietary modifications at this time.  Discussed with him at the bedside and he repeated this.  He was consistent today with repeat discussion about why we were interested in this with his recurrent pneumonias and concern for aspiration, but he again declined further evaluation.    Acute on chronic systolic heart failure  - Elevated BNP, lower extremity edema - lasix 60 IV BID -> switch to PO 40 tomorrow  - Repeat echo 6/19 with EF 40-45%, unable to eval LV diastolic function, mod increased PASP (see report) - improved from 01/2017 where EF was 30-35% -Continue beta blocker, currently holding arb (consider restarting tomorrow) -Daily weights, strict I's/O  -Hypoalbuminemia.  Follow UA (neg for protein).   -Patient's peak weight during hospitalization in December 2018 was 210 lbs and weight at admission 182 lbs (current weight 207, weights seem to be inaccurate) -Patient dry weight unknown and patient reports at facility is given a diet high in sugar and salt -Consider cardiology consultation as needed  Wt Readings from Last 3 Encounters:  08/18/17 85.3 kg (188 lb)  02/08/17 94.3 kg (207 lb 14.3 oz)  12/15/16 86.2 kg (190 lb)      Acute hypokalemia  Hypomag -Continue 20 meq BID - magnesium  improved, continue PO supp -Follow electrolytes    Chronic atrial fibrillation with RVR -with intermittent RVR, will increase metop to 25 mg q6 and follow (RVR improved) -eliquis    Decubitus ulcer of sacral region, stage 1 -Air mattress -Wound care consult -Albumin low-nutritional  consultation  Left Tibial Wound: - occurred after skin procedure a couple months ago - wound care per wound care c/s, appreciate recs - outpatient follow up    Hyperlipidemia -Continue Lipitor    GERD (gastroesophageal reflux disease) -Continue Protonix    Hypothyroidism (acquired) -Continue Synthroid -TSH (wnl)    History of Cervical spinal cord compression  -Patient has chronic paresthesias making it difficult to perform any ADLs including something as simple as holding urinal -Condom catheter   Anemia: chronic, follow  Thrombocytopenia: improved  Leukocytosis: 2/2 above, follow  DVT prophylaxis: eliquis Code Status: DNR Family Communication: declined me calling any family members today, stated his daughters were here yesterday and understood what was going on Disposition Plan: pending improvement   Consultants:   none  Procedures:   none  Antimicrobials: Anti-infectives (From admission, onward)   Start     Dose/Rate Route Frequency Ordered Stop   08/16/17 1800  azithromycin (ZITHROMAX) 500 mg in sodium chloride 0.9 % 250 mL IVPB  Status:  Discontinued     500 mg 250 mL/hr over 60 Minutes Intravenous Every 24 hours 08/15/17 1700 08/15/17 1732   08/16/17 0630  vancomycin (VANCOCIN) IVPB 1000 mg/200 mL premix  Status:  Discontinued     1,000 mg 200 mL/hr over 60 Minutes Intravenous Every 12 hours 08/15/17 1750 08/17/17 0759   08/16/17 0000  cefTRIAXone (ROCEPHIN) 1 g in sodium chloride 0.9 % 100 mL IVPB  Status:  Discontinued     1 g 200 mL/hr over 30 Minutes Intravenous Every 24 hours 08/15/17 1700 08/15/17 1732   08/15/17 1900  ceFEPIme (MAXIPIME) 2 g in sodium chloride 0.9 % 100 mL IVPB     2 g 200 mL/hr over 30 Minutes Intravenous Every 12 hours 08/15/17 1750 08/22/17 1859   08/15/17 1830  vancomycin (VANCOCIN) 1,500 mg in sodium chloride 0.9 % 500 mL IVPB     1,500 mg 250 mL/hr over 120 Minutes Intravenous  Once 08/15/17 1750 08/16/17 0909    08/15/17 1545  cefTRIAXone (ROCEPHIN) 1 g in sodium chloride 0.9 % 100 mL IVPB     1 g 200 mL/hr over 30 Minutes Intravenous  Once 08/15/17 1537 08/15/17 1638   08/15/17 1545  azithromycin (ZITHROMAX) 500 mg in sodium chloride 0.9 % 250 mL IVPB     500 mg 250 mL/hr over 60 Minutes Intravenous  Once 08/15/17 1537 08/15/17 1911     Subjective: Feeling better.  Objective: Vitals:   08/18/17 0500 08/18/17 0536 08/18/17 0737 08/18/17 0756  BP:  125/76  127/73  Pulse:  91  79  Resp:    17  Temp:    98.6 F (37 C)  TempSrc:    Oral  SpO2:   92% 97%  Weight: 85.3 kg (188 lb)     Height:        Intake/Output Summary (Last 24 hours) at 08/18/2017 1140 Last data filed at 08/18/2017 0930 Gross per 24 hour  Intake 17.71 ml  Output 2750 ml  Net -2732.29 ml   Filed Weights   08/16/17 0423 08/17/17 0600 08/18/17 0500  Weight: 83.9 kg (185 lb) 93.9 kg (207 lb) 85.3 kg (188 lb)    Examination:  General: No acute  distress. Cardiovascular: Heart sounds show a regular rate, and rhythm. No gallops or rubs. No murmurs. No JVD. Lungs: Improved upper airway sounds and work of breathing  Abdomen: Soft, nontender, nondistended with normal active bowel sounds. No masses. No hepatosplenomegaly. Neurological: Alert and oriented 3. Moves all extremities 4. Cranial nerves II through XII grossly intact. Extremities: No clubbing or cyanosis. Trace edema.  Psychiatric: Mood and affect are normal. Insight and judgment are appropriate.   Data Reviewed: I have personally reviewed following labs and imaging studies  CBC: Recent Labs  Lab 08/15/17 1229 08/16/17 0510 08/17/17 0350 08/18/17 0232  WBC 10.6* 7.3 12.4* 9.4  HGB 10.1* 9.2* 9.8* 9.7*  HCT 33.4* 30.7* 33.3* 32.7*  MCV 89.3 88.7 90.2 88.1  PLT 176 142* 170 161   Basic Metabolic Panel: Recent Labs  Lab 08/15/17 1229 08/16/17 0510 08/16/17 1100 08/17/17 0350 08/18/17 0232  NA 140 141  --  142 140  K 2.8* 3.4*  --  3.7 3.5  CL  103 107  --  103 99*  CO2 28 23  --  29 33*  GLUCOSE 120* 132*  --  109* 97  BUN 16 20  --  23* 22*  CREATININE 0.70 0.76  --  0.98 0.76  CALCIUM 8.2* 7.8*  --  8.4* 8.3*  MG  --   --  1.4* 1.8 1.7   GFR: Estimated Creatinine Clearance: 72.1 mL/min (by C-G formula based on SCr of 0.76 mg/dL). Liver Function Tests: Recent Labs  Lab 08/15/17 1229 08/17/17 0350  AST 22 25  ALT 12* 15*  ALKPHOS 111 112  BILITOT 1.5* 1.0  PROT 6.2* 5.8*  ALBUMIN 2.8* 2.7*   No results for input(s): LIPASE, AMYLASE in the last 168 hours. No results for input(s): AMMONIA in the last 168 hours. Coagulation Profile: No results for input(s): INR, PROTIME in the last 168 hours. Cardiac Enzymes: No results for input(s): CKTOTAL, CKMB, CKMBINDEX, TROPONINI in the last 168 hours. BNP (last 3 results) No results for input(s): PROBNP in the last 8760 hours. HbA1C: No results for input(s): HGBA1C in the last 72 hours. CBG: Recent Labs  Lab 08/15/17 1933 08/15/17 2120 08/16/17 0752 08/16/17 1139 08/17/17 1629  GLUCAP 245* 212* 121* 113* 106*   Lipid Profile: No results for input(s): CHOL, HDL, LDLCALC, TRIG, CHOLHDL, LDLDIRECT in the last 72 hours. Thyroid Function Tests: Recent Labs    08/15/17 1618  TSH 1.000   Anemia Panel: No results for input(s): VITAMINB12, FOLATE, FERRITIN, TIBC, IRON, RETICCTPCT in the last 72 hours. Sepsis Labs: Recent Labs  Lab 08/15/17 1618  PROCALCITON <0.10    Recent Results (from the past 240 hour(s))  Culture, blood (routine x 2) Call MD if unable to obtain prior to antibiotics being given     Status: None (Preliminary result)   Collection Time: 08/15/17  4:18 PM  Result Value Ref Range Status   Specimen Description BLOOD RIGHT HAND  Final   Special Requests   Final    BOTTLES DRAWN AEROBIC AND ANAEROBIC Blood Culture adequate volume   Culture   Final    NO GROWTH 3 DAYS Performed at [facility], [address] [place], [place] [address]     Report Status PENDING  Incomplete  Culture, blood (Routine X 2) w Reflex to ID Panel     Status: None (Preliminary result)   Collection Time: 08/16/17  5:10 AM  Result Value Ref Range Status   Specimen Description BLOOD RIGHT HAND  Final   Special Requests   Final    BOTTLES DRAWN AEROBIC AND ANAEROBIC Blood Culture adequate volume   Culture   Final    NO GROWTH 2 DAYS Performed at [facility], [address], [place], [place] [address]    Report Status PENDING  Incomplete  MRSA PCR Screening     Status: None   Collection Time: 08/16/17  6:41 PM  Result Value Ref Range Status   MRSA by PCR NEGATIVE NEGATIVE Final    Comment:        The GeneXpert MRSA Assay (FDA approved for NASAL specimens only), is one component of a comprehensive MRSA colonization surveillance program. It is not intended to diagnose MRSA infection nor to guide or monitor treatment for MRSA infections. Performed at [facility], [place] [address] [place], [place] [address]          Radiology Studies: No results found.      Scheduled Meds: . acetylcysteine  3 mL Nebulization TID  . apixaban  5 mg Oral BID  . aspirin EC  81 mg Oral Daily  . atorvastatin  10 mg Oral QPM  . azaTHIOprine  50 mg Oral BID  . docusate sodium  100 mg Oral BID  . fluticasone  1 spray Each Nare Daily  . [START ON 08/19/2017] furosemide  40 mg Oral Daily  . ipratropium-albuterol  3 mL Nebulization Q6H  . latanoprost  1 drop Both Eyes QHS  . levothyroxine  200 mcg Oral QAC breakfast  . magnesium oxide  400 mg Oral BID  . metoprolol tartrate  25 mg Oral Q6H  . mometasone-formoterol  2 puff Inhalation BID  . oxyCODONE  30 mg Oral Q12H  . pantoprazole  40 mg Oral BID  . potassium chloride  20 mEq Oral BID  . predniSONE  5 mg Oral Q breakfast  . psyllium  1 packet Oral QHS  . senna  1 tablet Oral QPM  . sertraline  150 mg Oral Daily  . sodium chloride flush  3 mL Intravenous Q12H  . temazepam  30 mg Oral QHS    Continuous Infusions: . ceFEPime (MAXIPIME) IV 2 g (08/18/17 0626)     LOS: 3 days    Time spent: over 30 min    Fayrene Helper, MD [facility] Pager [phone]  If 7PM-7AM, please contact night-coverage [email or web address] Password [email or web address] 08/18/2017, 11:40 AM

## 2017-08-18 NOTE — Care Management Important Message (Signed)
Important Message  Patient Details  Name: Travis Ross MRN: [id number] Date of Birth: 03-Jan-1927   Medicare Important Message Given:  Yes    Barb Merino Dillwyn 08/18/2017, 12:39 PM

## 2017-08-18 NOTE — Discharge Instructions (Signed)

## 2017-08-19 LAB — BASIC METABOLIC PANEL
ANION GAP: 7 (ref 5–15)
BUN: 19 mg/dL (ref 6–20)
CALCIUM: 8.6 mg/dL — AB (ref 8.9–10.3)
CHLORIDE: 101 mmol/L (ref 101–111)
CO2: 33 mmol/L — AB (ref 22–32)
Creatinine, Ser: 0.77 mg/dL (ref 0.61–1.24)
GFR calc non Af Amer: >60 mL/min (ref >60)
GLUCOSE: 104 mg/dL — AB (ref 65–99)
POTASSIUM: 4.1 mmol/L (ref 3.5–5.1)
Sodium: 141 mmol/L (ref 135–145)

## 2017-08-19 LAB — CBC
HEMATOCRIT: 33.9 % — AB (ref 39.0–52.0)
HEMOGLOBIN: 10 g/dL — AB (ref 13.0–17.0)
MCH: 26.3 pg (ref 26.0–34.0)
MCHC: 29.5 g/dL — ABNORMAL LOW (ref 30.0–36.0)
MCV: 89.2 fL (ref 78.0–100.0)
Platelets: 171 10*3/uL (ref 150–400)
RBC: 3.8 MIL/uL — AB (ref 4.22–5.81)
RDW: 16.6 % — ABNORMAL HIGH (ref 11.5–15.5)
WBC: 9.8 10*3/uL (ref 4.0–10.5)

## 2017-08-19 LAB — MAGNESIUM: Magnesium: 1.9 mg/dL (ref 1.7–2.4)

## 2017-08-19 MED ORDER — LOSARTAN POTASSIUM 25 MG PO TABS
12.5000 mg | ORAL_TABLET | Freq: Every day | ORAL | Status: DC
  Administered 2017-08-19 – 2017-08-25 (×7): 12.5 mg via ORAL
  Filled 2017-08-19 (×7): qty 1

## 2017-08-19 MED ORDER — METOPROLOL SUCCINATE ER 100 MG PO TB24
100.0000 mg | ORAL_TABLET | Freq: Every day | ORAL | Status: DC
  Administered 2017-08-20 – 2017-08-25 (×6): 100 mg via ORAL
  Filled 2017-08-19 (×6): qty 1

## 2017-08-19 NOTE — Progress Notes (Signed)
PROGRESS NOTE    Travis Ross  TSV:[id number] DOB: [birth year] DOA: 08/15/2017 PCP: Travis Poll, MD   Brief Narrative:  84 y.o. male with Travis Ross Past Medical History of afib; myasthenia gravis; hypothyroidism; HLD; hand paresthesias related to C-spine disease; glaucoma; HTN; and COPD who presents with SOB and recurrent respiratory illness since December.  He had lower extremity edema on exam at presentation concerning for HF exacerbation and had CT chest with findings c/w pneumonia.   Assessment & Plan:   Principal Problem:   Acute respiratory failure with hypoxia (HCC) Active Problems:   Hyperlipidemia   COPD with chronic bronchitis and emphysema (HCC)   GERD (gastroesophageal reflux disease)   Hypothyroidism (acquired)   History of Cervical spinal cord compression (HCC)   Myasthenia gravis (HCC)   Acute on chronic systolic heart failure (HCC)   Acute hypokalemia   Decubitus ulcer of sacral region, stage 1   Acute respiratory failure with hypoxia  -Patient presents with acute hypoxemic respiratory failure with recurrent episodes of dyspnea and shortness of breath previously treated as community-acquired pneumonia in the outpatient setting (6x since Oct 2018) - Possibly multifactorial with pneumonia on CT, but notable LE edema on exam which could be c/w HF exacerbation -Continue supportive care with oxygen and wean as tolerated noting was on RA prior to current symptoms (on 1 L O2 at present, improving)  Pneumonia: pt with LLL and RLL opacities as well as RUL nodular opacity on imaging.   -Obtain ESR (elevated), respiratory procalcitonin (normal), blood cultures (NGTD), urinary strep (negative), sputum culture (pending collection) - MRSA PCR negative - CT chest recommending f/u 8-12 weeks  - Continue cefepime (7 days), stop vancomycin  COPD with chronic bronchitis and emphysema  -Pt with transmitted upper airway sounds, don't think this is c/w COPD exacerbation -Abx for pneumonia  as noted above -scheduled nebs, xopenex prn -Continue Dulera and Combivent for now  -Continue chronic prednisone 10 mg daily for now (pt taking 5 mg daily - this is ordered)    Myasthenia gravis  - He notes this has been stable with no exacerbation in about 2 years - Continue Imuran - avoid meds that could exacerbate MG  Dysphagia - pt declined further evaluation by speech at this time and noted he would not be open to any dietary modifications at this time.  Discussed with him at the bedside and he repeated this.  He was consistent today with repeat discussion about why we were interested in this with his recurrent pneumonias and concern for aspiration, but he again declined further evaluation.    Acute on chronic systolic heart failure  - Elevated BNP, lower extremity edema - lasix 60 IV BID -> switch to PO 40  - Repeat echo 6/19 with EF 40-45%, unable to eval LV diastolic function, mod increased PASP (see report) - improved from 01/2017 where EF was 30-35% -Continue beta blocker, losartan -Daily weights, strict I's/O  -Hypoalbuminemia.  Follow UA (neg for protein).   -Patient's peak weight during hospitalization in December 2018 was 210 lbs and weight at admission 182 lbs (current weight 207, weights seem to be inaccurate) -Patient dry weight unknown and patient reports at facility is given Travis Ross diet high in sugar and salt -Consider cardiology consultation as needed  Wt Readings from Last 3 Encounters:  08/19/17 85.3 kg (188 lb)  02/08/17 94.3 kg (207 lb 14.3 oz)  12/15/16 86.2 kg (190 lb)      Acute hypokalemia  Hypomag - continue PO mag -  follow K, stopped supplemental as d/c'ing diuresis    Chronic atrial fibrillation with RVR -with intermittent RVR, will transition to 100 mg PO tomorrow (RVR improved) -eliquis    Decubitus ulcer of sacral region, stage 1 -Air mattress -Wound care consult -Albumin low-nutritional consultation  Left Tibial Wound: - occurred after  skin procedure Travis Ross couple months ago - wound care per wound care c/s, appreciate recs - outpatient follow up    Hyperlipidemia -Continue Lipitor    GERD (gastroesophageal reflux disease) -Continue Protonix    Hypothyroidism (acquired) -Continue Synthroid -TSH (wnl)    History of Cervical spinal cord compression  -Patient has chronic paresthesias making it difficult to perform any ADLs including something as simple as holding urinal -Condom catheter   Anemia: chronic, follow  Thrombocytopenia: improved  Leukocytosis: 2/2 above, follow  DVT prophylaxis: eliquis Code Status: DNR Family Communication: none at bedside Disposition Plan: pending improvement   Consultants:   none  Procedures:   none  Antimicrobials: Anti-infectives (From admission, onward)   Start     Dose/Rate Route Frequency Ordered Stop   08/16/17 1800  azithromycin (ZITHROMAX) 500 mg in sodium chloride 0.9 % 250 mL IVPB  Status:  Discontinued     500 mg 250 mL/hr over 60 Minutes Intravenous Every 24 hours 08/15/17 1700 08/15/17 1732   08/16/17 0630  vancomycin (VANCOCIN) IVPB 1000 mg/200 mL premix  Status:  Discontinued     1,000 mg 200 mL/hr over 60 Minutes Intravenous Every 12 hours 08/15/17 1750 08/17/17 0759   08/16/17 0000  cefTRIAXone (ROCEPHIN) 1 g in sodium chloride 0.9 % 100 mL IVPB  Status:  Discontinued     1 g 200 mL/hr over 30 Minutes Intravenous Every 24 hours 08/15/17 1700 08/15/17 1732   08/15/17 1900  ceFEPIme (MAXIPIME) 2 g in sodium chloride 0.9 % 100 mL IVPB     2 g 200 mL/hr over 30 Minutes Intravenous Every 12 hours 08/15/17 1750 08/22/17 1859   08/15/17 1830  vancomycin (VANCOCIN) 1,500 mg in sodium chloride 0.9 % 500 mL IVPB     1,500 mg 250 mL/hr over 120 Minutes Intravenous  Once 08/15/17 1750 08/16/17 0909   08/15/17 1545  cefTRIAXone (ROCEPHIN) 1 g in sodium chloride 0.9 % 100 mL IVPB     1 g 200 mL/hr over 30 Minutes Intravenous  Once 08/15/17 1537 08/15/17 1638    08/15/17 1545  azithromycin (ZITHROMAX) 500 mg in sodium chloride 0.9 % 250 mL IVPB     500 mg 250 mL/hr over 60 Minutes Intravenous  Once 08/15/17 1537 08/15/17 1911     Subjective: Feeling Travis Ross bit more energy. Still coughing Ipek Westra bit of mucus up.  Objective: Vitals:   08/18/17 1951 08/18/17 2311 08/19/17 0500 08/19/17 0600  BP:  115/77  (!) 153/95  Pulse:  77  80  Resp:  18    Temp:  99.1 F (37.3 C)    TempSrc:  Oral    SpO2: 97% 93%    Weight:   85.3 kg (188 lb)   Height:        Intake/Output Summary (Last 24 hours) at 08/19/2017 0808 Last data filed at 08/18/2017 1715 Gross per 24 hour  Intake 203 ml  Output 250 ml  Net -47 ml   Filed Weights   08/17/17 0600 08/18/17 0500 08/19/17 0500  Weight: 93.9 kg (207 lb) 85.3 kg (188 lb) 85.3 kg (188 lb)    Examination:  General: No acute distress. Cardiovascular: Heart sounds show Delight Bickle  regular rate, and rhythm. No gallops or rubs. No murmurs. No JVD. Lungs: No increased WOB.  Transmitted upper airway sounds.  Abdomen: Soft, nontender, nondistended with normal active bowel sounds. No masses. No hepatosplenomegaly. Neurological: Alert and oriented 3. Moves all extremities 4. Cranial nerves II through XII grossly intact. Skin: Warm and dry. No rashes or lesions. Extremities: No clubbing or cyanosis. Trace edema Psychiatric: Mood and affect are normal. Insight and judgment are appropriate.   Data Reviewed: I have personally reviewed following labs and imaging studies  CBC: Recent Labs  Lab 08/15/17 1229 08/16/17 0510 08/17/17 0350 08/18/17 0232 08/19/17 0410  WBC 10.6* 7.3 12.4* 9.4 9.8  HGB 10.1* 9.2* 9.8* 9.7* 10.0*  HCT 33.4* 30.7* 33.3* 32.7* 33.9*  MCV 89.3 88.7 90.2 88.1 89.2  PLT 176 142* 170 162 073   Basic Metabolic Panel: Recent Labs  Lab 08/15/17 1229 08/16/17 0510 08/16/17 1100 08/17/17 0350 08/18/17 0232 08/19/17 0410  NA 140 141  --  142 140 141  K 2.8* 3.4*  --  3.7 3.5 4.1  CL 103 107  --  103  99* 101  CO2 28 23  --  29 33* 33*  GLUCOSE 120* 132*  --  109* 97 104*  BUN 16 20  --  23* 22* 19  CREATININE 0.70 0.76  --  0.98 0.76 0.77  CALCIUM 8.2* 7.8*  --  8.4* 8.3* 8.6*  MG  --   --  1.4* 1.8 1.7 1.9   GFR: Estimated Creatinine Clearance: 72.1 mL/min (by C-G formula based on SCr of 0.77 mg/dL). Liver Function Tests: Recent Labs  Lab 08/15/17 1229 08/17/17 0350  AST 22 25  ALT 12* 15*  ALKPHOS 111 112  BILITOT 1.5* 1.0  PROT 6.2* 5.8*  ALBUMIN 2.8* 2.7*   No results for input(s): LIPASE, AMYLASE in the last 168 hours. No results for input(s): AMMONIA in the last 168 hours. Coagulation Profile: No results for input(s): INR, PROTIME in the last 168 hours. Cardiac Enzymes: No results for input(s): CKTOTAL, CKMB, CKMBINDEX, TROPONINI in the last 168 hours. BNP (last 3 results) No results for input(s): PROBNP in the last 8760 hours. HbA1C: No results for input(s): HGBA1C in the last 72 hours. CBG: Recent Labs  Lab 08/15/17 1933 08/15/17 2120 08/16/17 0752 08/16/17 1139 08/17/17 1629  GLUCAP 245* 212* 121* 113* 106*   Lipid Profile: No results for input(s): CHOL, HDL, LDLCALC, TRIG, CHOLHDL, LDLDIRECT in the last 72 hours. Thyroid Function Tests: No results for input(s): TSH, T4TOTAL, FREET4, T3FREE, THYROIDAB in the last 72 hours. Anemia Panel: No results for input(s): VITAMINB12, FOLATE, FERRITIN, TIBC, IRON, RETICCTPCT in the last 72 hours. Sepsis Labs: Recent Labs  Lab 08/15/17 1618  PROCALCITON <0.10    Recent Results (from the past 240 hour(s))  Culture, blood (routine x 2) Call MD if unable to obtain prior to antibiotics being given     Status: None (Preliminary result)   Collection Time: 08/15/17  4:18 PM  Result Value Ref Range Status   Specimen Description BLOOD RIGHT HAND  Final   Special Requests   Final    BOTTLES DRAWN AEROBIC AND ANAEROBIC Blood Culture adequate volume   Culture   Final    NO GROWTH 3 DAYS Performed at [facility], [address] [place], [place] [address]    Report Status PENDING  Incomplete  Culture, blood (Routine X 2) w Reflex to ID Panel     Status: None (Preliminary result)  Collection Time: 08/16/17  5:10 AM  Result Value Ref Range Status   Specimen Description BLOOD RIGHT HAND  Final   Special Requests   Final    BOTTLES DRAWN AEROBIC AND ANAEROBIC Blood Culture adequate volume   Culture   Final    NO GROWTH 2 DAYS Performed at [facility], [address], [place], [place] [address]    Report Status PENDING  Incomplete  MRSA PCR Screening     Status: None   Collection Time: 08/16/17  6:41 PM  Result Value Ref Range Status   MRSA by PCR NEGATIVE NEGATIVE Final    Comment:        The GeneXpert MRSA Assay (FDA approved for NASAL specimens only), is one component of Roarke Marciano comprehensive MRSA colonization surveillance program. It is not intended to diagnose MRSA infection nor to guide or monitor treatment for MRSA infections. Performed at [facility], [place] [address], [place], [place] [address]          Radiology Studies: No results found.      Scheduled Meds: . apixaban  5 mg Oral BID  . aspirin EC  81 mg Oral Daily  . atorvastatin  10 mg Oral QPM  . azaTHIOprine  50 mg Oral BID  . docusate sodium  100 mg Oral BID  . fluticasone  1 spray Each Nare Daily  . furosemide  40 mg Oral Daily  . ipratropium-albuterol  3 mL Nebulization Q6H  . latanoprost  1 drop Both Eyes QHS  . levothyroxine  200 mcg Oral QAC breakfast  . magnesium oxide  400 mg Oral BID  . metoprolol tartrate  25 mg Oral Q6H  . mometasone-formoterol  2 puff Inhalation BID  . oxyCODONE  30 mg Oral Q12H  . pantoprazole  40 mg Oral BID  . potassium chloride  20 mEq Oral BID  . predniSONE  5 mg Oral Q breakfast  . psyllium  1 packet Oral QHS  . senna  1 tablet Oral QPM  . sertraline  150 mg Oral Daily  . sodium chloride flush  3 mL Intravenous Q12H  . temazepam  30 mg Oral QHS    Continuous Infusions: . ceFEPime (MAXIPIME) IV 2 g (08/19/17 0706)     LOS: 4 days    Time spent: over 71 min    Fayrene Helper, MD [facility] Pager [phone]  If 7PM-7AM, please contact night-coverage [email or web address] Password [facility] 08/19/2017, 8:08 AM

## 2017-08-20 LAB — CBC
HCT: 34.8 % — ABNORMAL LOW (ref 39.0–52.0)
HEMOGLOBIN: 10.3 g/dL — AB (ref 13.0–17.0)
MCH: 26.3 pg (ref 26.0–34.0)
MCHC: 29.6 g/dL — AB (ref 30.0–36.0)
MCV: 89 fL (ref 78.0–100.0)
Platelets: 196 10*3/uL (ref 150–400)
RBC: 3.91 MIL/uL — AB (ref 4.22–5.81)
RDW: 16.8 % — ABNORMAL HIGH (ref 11.5–15.5)
WBC: 9.8 10*3/uL (ref 4.0–10.5)

## 2017-08-20 LAB — BASIC METABOLIC PANEL
ANION GAP: 8 (ref 5–15)
BUN: 19 mg/dL (ref 6–20)
CALCIUM: 8.6 mg/dL — AB (ref 8.9–10.3)
CHLORIDE: 99 mmol/L — AB (ref 101–111)
CO2: 32 mmol/L (ref 22–32)
CREATININE: 0.8 mg/dL (ref 0.61–1.24)
GFR calc non Af Amer: >60 mL/min (ref >60)
Glucose, Bld: 100 mg/dL — ABNORMAL HIGH (ref 65–99)
Potassium: 3.9 mmol/L (ref 3.5–5.1)
SODIUM: 139 mmol/L (ref 135–145)

## 2017-08-20 LAB — CULTURE, BLOOD (ROUTINE X 2)
CULTURE: NO GROWTH
Special Requests: ADEQUATE

## 2017-08-20 LAB — MAGNESIUM: Magnesium: 1.8 mg/dL (ref 1.7–2.4)

## 2017-08-20 MED ORDER — IPRATROPIUM-ALBUTEROL 0.5-2.5 (3) MG/3ML IN SOLN
3.0000 mL | Freq: Three times a day (TID) | RESPIRATORY_TRACT | Status: DC
  Administered 2017-08-20 – 2017-08-21 (×6): 3 mL via RESPIRATORY_TRACT
  Filled 2017-08-20 (×6): qty 3

## 2017-08-20 NOTE — Progress Notes (Signed)
SLP Cancellation Note  Patient Details Name: Travis Ross MRN: [id number] DOB: [birth year]   Cancelled treatment:       Reason Eval/Treat Not Completed: SLP screened, no needs identified, will sign off. Orders received stating pt willing to undergo swallow evaluation. Met with pt at bedside and spoke with him for about 30 minutes. He states he does not want to have another swallow evaluation and "I don't see the point." He states he would not want to make dietary modifications even if they were beneficial. SLP educated re: risks given recurrent pneumonia; pt verbalized understanding and is very clear that he would like to continue eating and drinking a non-restricted diet. I educated pt re: instrumental testing and he agreed he would inform MD if he changes his mind and wishes to pursue; MD could order MBS at that time if pt consents.   Travis Ross, [place], [place] Speech-Language Pathologist [phone]    Travis Ross 08/20/2017, 5:35 PM

## 2017-08-20 NOTE — Consult Note (Signed)
Name: Travis Ross MRN: [id number] DOB: 1926-05-21    ADMISSION DATE:  08/15/2017 CONSULTATION DATE:  08/20/2017  REFERRING MD :  Florene Glen  CHIEF COMPLAINT:  Recurrent Pneumonia   BRIEF PATIENT DESCRIPTION: Travis Ross is a 84 y.o. male with myasthenia gravis and cervical spine disease who has had recurrent pneumonias over the past 6 months that have been associated with CHF exacerbations. This current episode of PNA for which the patient has been admitted was worked up with a CT thorax that revealed a left sided elevated diaphragm and atelectatic lung on that side surrounded by GGO's as well as a new RUL 1 X 2cm nodule concerning for a primary malignancy. The team has reached out to [name] for further recommendations regarding his respiratory status and for evaluation of possible bronchoscopy.   SIGNIFICANT EVENTS  -Presented with AHRF complicated by HF exacerbation -Has been weaned down on oxygen and is currently only on 1 L of supplemental O2 -Completing a course of 7 days of cefepime for broad spectrum coverage -CT scan on 6/18 was concerning for aspiration pneumonitis and new RUL nodule   STUDIES:   CT Thorax 08/15/2017 IMPRESSION: 1. LEFT LOWER lobe consolidation, scattered patchy lingular and RIGHT LOWER lobe opacities and new 1 x 2.3 cm RIGHT UPPER lobe nodular opacity. Given that some of these opacities are likely infectious/pneumonia, recommend CT follow-up in 8-12 weeks following appropriate antibiotic therapy. 2. Very small bilateral pleural effusions. 3. Cardiomegaly 4. Coronary artery and Aortic Atherosclerosis (ICD10-I70.0).   PAST MEDICAL HISTORY :   has a past medical history of CAP (community acquired pneumonia) (02/04/2017), COPD with chronic bronchitis and emphysema ([place]), Essential hypertension, Femur fracture, right ([place]), GERD (gastroesophageal reflux disease), Glaucoma, History of Cervical spinal cord compression w/ residual hand parestheisa, Hyperlipidemia,  Hypothyroidism (acquired), Myasthenia gravis ([place]), Osteoarthritis, Paresthesia, and Permanent atrial fibrillation ([place]).  has a past surgical history that includes Replacement total knee (Bilateral); Elbow surgery (08/2015); Cholecystectomy; Carpal tunnel release; Back surgery; and Total knee revision (Right, 12/15/2016). Prior to Admission medications   Medication Sig Start Date End Date Taking? Authorizing Provider  acetaminophen (TYLENOL) 325 MG tablet Take 650 mg by mouth every 6 (six) hours as needed for moderate pain.   Yes [provider]  aspirin EC 81 MG tablet Take 81 mg by mouth daily.   Yes [provider]  atorvastatin (LIPITOR) 10 MG tablet Take 10 mg by mouth every evening.  10/28/16  Yes [provider]  azaTHIOprine (IMURAN) 50 MG tablet Take 50 mg by mouth 2 (two) times daily.   Yes [provider]  benzonatate (TESSALON) 100 MG capsule Take 100 mg by mouth at bedtime.   Yes [provider]  Brinzolamide-Brimonidine (SIMBRINZA) 1-0.2 % SUSP Place 1 drop into both eyes 2 (two) times daily.   Yes [provider]  clopidogrel (PLAVIX) 75 MG tablet Take 75 mg by mouth 2 (two) times a week.  10/28/16  Yes [provider]  docusate sodium (COLACE) 100 MG capsule Take 100 mg by mouth 2 (two) times daily.   Yes [provider]  DULERA 100-5 MCG/ACT AERO Take 2 puffs by mouth 2 (two) times daily.  10/10/16  Yes [provider]  fluticasone (FLONASE) 50 MCG/ACT nasal spray Place 1 spray into both nostrils daily.   Yes [provider]  furosemide (LASIX) 40 MG tablet Take 40 mg by mouth daily. 11/09/16  Yes [provider]  guaiFENesin (MUCINEX) 600 MG 12 hr tablet Take 600  mg by mouth 2 (two) times daily.   Yes [provider]  Ipratropium-Albuterol (COMBIVENT RESPIMAT) 20-100 MCG/ACT AERS respimat Inhale 2 puffs into the lungs 2 (two) times daily.    Yes [provider]    levothyroxine (SYNTHROID, LEVOTHROID) 200 MCG tablet Take 200 mcg by mouth daily.  10/31/16  Yes [provider]  losartan (COZAAR) 25 MG tablet Take 0.5 tablets (12.5 mg total) by mouth daily. Patient taking differently: Take 50 mg by mouth daily.  02/08/17  Yes Katherine Roan, MD  magnesium hydroxide (MILK OF MAGNESIA) 400 MG/5ML suspension Take 30 mLs by mouth See admin instructions. every 24 hours as needed for constipation.   Yes [provider]  metoprolol succinate (TOPROL-XL) 50 MG 24 hr tablet Take 50 mg by mouth daily. 10/28/16  Yes [provider]  oxyCODONE (OXYCONTIN) 30 MG 12 hr tablet Take 1 tablet (30 mg total) by mouth every 12 (twelve) hours. 12/20/16  Yes Perkins, Alexzandrew L, PA-C  pantoprazole (PROTONIX) 40 MG tablet Take 40 mg by mouth 2 (two) times daily.  10/28/16  Yes [provider]  polycarbophil (FIBERCON) 625 MG tablet Take 1 tablet (625 mg total) by mouth at bedtime. 12/20/16  Yes Perkins, Alexzandrew L, PA-C  potassium chloride 20 MEQ/15ML (10%) SOLN Take 7.5 mLs by mouth daily. 08/05/17  Yes [provider]  predniSONE (DELTASONE) 10 MG tablet Take 1 tablet (10 mg total) by mouth daily with breakfast. Patient taking differently: Take 5 mg by mouth daily with breakfast.  02/07/17  Yes Winfrey, Jenne Pane, MD  Psyllium (FIBER) 0.52 g CAPS Take 0.52 g by mouth at bedtime.   Yes [provider]  senna (SENOKOT) 8.6 MG tablet Take 1 tablet by mouth every evening.   Yes [provider]  sertraline (ZOLOFT) 100 MG tablet Take 150 mg by mouth daily.  10/28/16  Yes [provider]  temazepam (RESTORIL) 30 MG capsule Take 30 mg by mouth at bedtime.  11/09/16  Yes [provider]  TRAVATAN Z 0.004 % SOLN ophthalmic solution Place 1 drop into both eyes at bedtime.  11/09/16  Yes [provider]   No Known Allergies  FAMILY HISTORY:  family history includes Arthritis in his brother and  sister; Heart failure in his father; Other in his mother. SOCIAL HISTORY:  reports that he has never smoked. He has never used smokeless tobacco. He reports that he does not drink alcohol or use drugs.  REVIEW OF SYSTEMS:   Constitutional: Negative for fever, chills, weight loss, malaise/fatigue and diaphoresis.  HENT: Negative for hearing loss, ear pain, nosebleeds, congestion, sore throat, neck pain, tinnitus and ear discharge.   Eyes: Negative for blurred vision, double vision, photophobia, pain, discharge and redness.  Respiratory: Negative for cough, hemoptysis, sputum production, shortness of breath, wheezing and stridor.   Cardiovascular: Negative for chest pain, palpitations, orthopnea, claudication, leg swelling and PND.  Gastrointestinal: Negative for heartburn, nausea, vomiting, abdominal pain, diarrhea, constipation, blood in stool and melena.  Genitourinary: Negative for dysuria, urgency, frequency, hematuria and flank pain.  Musculoskeletal: Negative for myalgias, back pain, joint pain and falls.  Skin: Negative for itching and rash.  Neurological: Negative for dizziness, tingling, tremors, sensory change, speech change, focal weakness, seizures, loss of consciousness, weakness and headaches.  Endo/Heme/Allergies: Negative for environmental allergies and polydipsia. Does not bruise/bleed easily.  SUBJECTIVE:   VITAL SIGNS: Temp:  [97.9 F (36.6 C)-98.3 F (36.8 C)] 97.9 F (36.6 C) (06/23 0841) Pulse Rate:  [  84-91] 84 (06/23 0841) Resp:  [16-22] 16 (06/23 0841) BP: (132-169)/(76-100) 132/76 (06/23 0841) SpO2:  [93 %-97 %] 97 % (06/23 0930)  PHYSICAL EXAMINATION: General:  Elderly gentleman resting in bed reading the paper with [place] in place and not in respiratory distress  Neuro:  Alert and only oriented X 2 Place and person, no ptosis noted. Spontaneously moving all 4 extremities  HEENT:  Mallampati III, dry mucus membranes, no LAD appreciated  Cardiovascular:  RRR with  distant heart sounds  Lungs:  Decreased air movement throughout, LLL crackles and rhonchi present with associated dullness to percussion.  Abdomen:  Soft, non-tender, normal BS Skin:  Scattered ecchymotic lesions on arms bilaterally   Recent Labs  Lab 08/18/17 0232 08/19/17 0410 08/20/17 0453  NA 140 141 139  K 3.5 4.1 3.9  CL 99* 101 99*  CO2 33* 33* 32  BUN 22* 19 19  CREATININE 0.76 0.77 0.80  GLUCOSE 97 104* 100*   Recent Labs  Lab 08/18/17 0232 08/19/17 0410 08/20/17 0453  HGB 9.7* 10.0* 10.3*  HCT 32.7* 33.9* 34.8*  WBC 9.4 9.8 9.8  PLT 162 171 196   No results found.  ASSESSMENT / PLAN:  Jaicion Laurie is a 84 y.o. male with MG who has had progressively more frequent recurrent pneumonias over the past 6 months and who was recently admitted for hypoxia, pneumonia, and CHF exacerbation. His CT showed a new RUL nodule concerning for malignancy. History from the family is also concerning for continuous aspiration events in the setting of his underlying neuromuscular disease.   -Given the patient's overall limited functional capacity and his neuromuscular disease, he would be a high risk patient for prolonged intubation if proceeding with an EMN bronchoscopy to sample his RUL nodule. This was discussed with the patient's daughters who collectively agreed that even if it was cancer he would not have wanted to have chemotherapy or radiation and likely would not be a surgical candidate.  -Agree with further workup for dysphagia and aspiration with speech therapy consultation.  -Continue antibiotics for 7 day course as currently prescribed -Patient is already set up for an outpatient Pulmonary appointment on July 2nd per family and should continue his respiratory function testing in that setting once discharged.   Thank you for the interesting consult and don't hesitate to reach out to the Pulmonary service if you have any additional questions or concerns.   Caffie Damme,  MD Pul[facility] [place] Pager: [phone]  08/20/2017, 1:23 PM

## 2017-08-20 NOTE — Progress Notes (Signed)
PROGRESS NOTE    Travis Ross  HBZ:[id number] DOB: January 02, 1927 DOA: 08/15/2017 PCP: [name], MD   Brief Narrative:  84 y.o. male with Travis Ross Past Medical History of afib; myasthenia gravis; hypothyroidism; HLD; hand paresthesias related to C-spine disease; glaucoma; HTN; and COPD who presents with SOB and recurrent respiratory illness since December.  He had lower extremity edema on exam at presentation concerning for HF exacerbation and had CT chest with findings c/w pneumonia.   Assessment & Plan:   Principal Problem:   Acute respiratory failure with hypoxia (HCC) Active Problems:   Hyperlipidemia   COPD with chronic bronchitis and emphysema (HCC)   GERD (gastroesophageal reflux disease)   Hypothyroidism (acquired)   History of Cervical spinal cord compression (HCC)   Myasthenia gravis (HCC)   Acute on chronic systolic heart failure (HCC)   Acute hypokalemia   Decubitus ulcer of sacral region, stage 1   Acute respiratory failure with hypoxia  -Patient presents with acute hypoxemic respiratory failure with recurrent episodes of dyspnea and shortness of breath previously treated as community-acquired pneumonia in the outpatient setting (6x since Oct 2018) - Possibly multifactorial with pneumonia on CT, but notable LE edema on exam which could be c/w HF exacerbation -Continue supportive care with oxygen and wean as tolerated noting was on RA prior to current symptoms (on 1 L O2 at present, improving) - pt declined earlier speech evaluation, discussed again today and he's willing to undergo this (concern that aspiration may be contributing to below)  Pneumonia: pt with LLL and RLL opacities as well as RUL nodular opacity on imaging.   -Obtain ESR (elevated), respiratory procalcitonin (normal), blood cultures (NGTD), urinary strep (negative), sputum culture (pending collection) - MRSA PCR negative - CT chest recommending f/u 8-12 weeks  - Continue cefepime (7 days), stop  vancomycin - On discussion with daughters, pt has had recurrent bouts of pneumonia every few weeks since December.  They estimate 5-6 episodes treated recurrently with levaquin.  Given his recurrent pneumonia and  CT findings here, will ask pulmonology to see him as well (had appt with Dr. Melvyn Novas in early July).  COPD with chronic bronchitis and emphysema  -Pt with transmitted upper airway sounds, don't think this is c/w COPD exacerbation -Abx for pneumonia as noted above -scheduled nebs, xopenex prn -Continue Dulera and Combivent for now  -Continue chronic prednisone 10 mg daily for now (pt taking 5 mg daily - this is ordered)    Myasthenia gravis  - He notes this has been stable with no exacerbation in about 2 years - Continue Imuran - avoid meds that could exacerbate MG  Dysphagia - pt declined earlier speech evaluation.  Had long discussion today about rational and he is agreeable for speech evaluation.    Acute on chronic systolic heart failure  - Elevated BNP, lower extremity edema - lasix 60 IV BID -> switch to PO 40  - Repeat echo 6/19 with EF 40-45%, unable to eval LV diastolic function, mod increased PASP (see report) - improved from 01/2017 where EF was 30-35% -Continue beta blocker, losartan -Daily weights, strict I's/O  -Hypoalbuminemia.  Follow UA (neg for protein).   -Patient's peak weight during hospitalization in December 2018 was 210 lbs and weight at admission 182 lbs (current weight 207, weights seem to be inaccurate - net negative) -Patient dry weight unknown and patient reports at facility is given Lillien Petronio diet high in sugar and salt -Consider cardiology consultation as needed  Wt Readings from Last 3 Encounters:  08/19/17 85.3 kg (188 lb)  02/08/17 94.3 kg (207 lb 14.3 oz)  12/15/16 86.2 kg (190 lb)      Acute hypokalemia  Hypomag - continue PO mag - follow K, stopped supplemental as d/c'ing diuresis    Chronic atrial fibrillation with RVR -with  intermittent RVR, metoprolol 100 mg daily (RVR improved) -eliquis    Decubitus ulcer of sacral region, stage 1 -Air mattress -Wound care consult -Albumin low-nutritional consultation  Left Tibial Wound: - occurred after skin procedure Travis Ross couple months ago - wound care per wound care c/s, appreciate recs - outpatient follow up    Hyperlipidemia -Continue Lipitor    GERD (gastroesophageal reflux disease) -Continue Protonix    Hypothyroidism (acquired) -Continue Synthroid -TSH (wnl)    History of Cervical spinal cord compression  -Patient has chronic paresthesias (family notes at least for 1-2 years) making it difficult to perform any ADLs including something as simple as holding urinal -Condom catheter   Anemia: chronic, follow  Thrombocytopenia: improved  Leukocytosis: 2/2 above, follow  DVT prophylaxis: eliquis Code Status: DNR Family Communication: discussed with daughter Travis Ross over phone on 6/22.  Discussed with Travis Ross and Travis Ross today at bedside on 6/23. Disposition Plan: pending improvement   Consultants:   none  Procedures:   none  Antimicrobials: Anti-infectives (From admission, onward)   Start     Dose/Rate Route Frequency Ordered Stop   08/16/17 1800  azithromycin (ZITHROMAX) 500 mg in sodium chloride 0.9 % 250 mL IVPB  Status:  Discontinued     500 mg 250 mL/hr over 60 Minutes Intravenous Every 24 hours 08/15/17 1700 08/15/17 1732   08/16/17 0630  vancomycin (VANCOCIN) IVPB 1000 mg/200 mL premix  Status:  Discontinued     1,000 mg 200 mL/hr over 60 Minutes Intravenous Every 12 hours 08/15/17 1750 08/17/17 0759   08/16/17 0000  cefTRIAXone (ROCEPHIN) 1 g in sodium chloride 0.9 % 100 mL IVPB  Status:  Discontinued     1 g 200 mL/hr over 30 Minutes Intravenous Every 24 hours 08/15/17 1700 08/15/17 1732   08/15/17 1900  ceFEPIme (MAXIPIME) 2 g in sodium chloride 0.9 % 100 mL IVPB     2 g 200 mL/hr over 30 Minutes Intravenous Every 12 hours 08/15/17  1750 08/22/17 1859   08/15/17 1830  vancomycin (VANCOCIN) 1,500 mg in sodium chloride 0.9 % 500 mL IVPB     1,500 mg 250 mL/hr over 120 Minutes Intravenous  Once 08/15/17 1750 08/16/17 0909   08/15/17 1545  cefTRIAXone (ROCEPHIN) 1 g in sodium chloride 0.9 % 100 mL IVPB     1 g 200 mL/hr over 30 Minutes Intravenous  Once 08/15/17 1537 08/15/17 1638   08/15/17 1545  azithromycin (ZITHROMAX) 500 mg in sodium chloride 0.9 % 250 mL IVPB     500 mg 250 mL/hr over 60 Minutes Intravenous  Once 08/15/17 1537 08/15/17 1911     Subjective: Feeling Travis Ross bit worse today.   Still coughing up Travis Ross lot of mucus.  Objective: Vitals:   08/19/17 2049 08/20/17 0012 08/20/17 0841 08/20/17 0930  BP:  (!) 169/100 132/76   Pulse:  91 84   Resp:  (!) 22 16   Temp:  98.3 F (36.8 C) 97.9 F (36.6 C)   TempSrc:  Oral Oral   SpO2: 97% 96% 97% 97%  Weight:      Height:        Intake/Output Summary (Last 24 hours) at 08/20/2017 1246 Last data filed at 08/20/2017 [phone]  Gross per 24 hour  Intake 240 ml  Output 2025 ml  Net -1785 ml   Filed Weights   08/17/17 0600 08/18/17 0500 08/19/17 0500  Weight: 93.9 kg (207 lb) 85.3 kg (188 lb) 85.3 kg (188 lb)    Examination:  General: No acute distress. Cardiovascular: Heart sounds show Travis Ross regular rate, and rhythm. No gallops or rubs. No murmurs. No JVD. Lungs: Coarse lung sounds, transmitted upper airway sounds dominant Abdomen: Soft, nontender, nondistended with normal active bowel sounds. No masses. No hepatosplenomegaly. Neurological: Alert and oriented 3. Moves all extremities 4 Cranial nerves II through XII grossly intact. Skin: Warm and dry. No rashes or lesions. Extremities: No clubbing or cyanosis. Trace edema. Psychiatric: Mood and affect are normal. Insight and judgment are appropriate.   Data Reviewed: I have personally reviewed following labs and imaging studies  CBC: Recent Labs  Lab 08/16/17 0510 08/17/17 0350 08/18/17 0232 08/19/17 0410  08/20/17 0453  WBC 7.3 12.4* 9.4 9.8 9.8  HGB 9.2* 9.8* 9.7* 10.0* 10.3*  HCT 30.7* 33.3* 32.7* 33.9* 34.8*  MCV 88.7 90.2 88.1 89.2 89.0  PLT 142* 170 162 171 299   Basic Metabolic Panel: Recent Labs  Lab 08/16/17 0510 08/16/17 1100 08/17/17 0350 08/18/17 0232 08/19/17 0410 08/20/17 0453  NA 141  --  142 140 141 139  K 3.4*  --  3.7 3.5 4.1 3.9  CL 107  --  103 99* 101 99*  CO2 23  --  29 33* 33* 32  GLUCOSE 132*  --  109* 97 104* 100*  BUN 20  --  23* 22* 19 19  CREATININE 0.76  --  0.98 0.76 0.77 0.80  CALCIUM 7.8*  --  8.4* 8.3* 8.6* 8.6*  MG  --  1.4* 1.8 1.7 1.9 1.8   GFR: Estimated Creatinine Clearance: 72.1 mL/min (by C-G formula based on SCr of 0.8 mg/dL). Liver Function Tests: Recent Labs  Lab 08/15/17 1229 08/17/17 0350  AST 22 25  ALT 12* 15*  ALKPHOS 111 112  BILITOT 1.5* 1.0  PROT 6.2* 5.8*  ALBUMIN 2.8* 2.7*   No results for input(s): LIPASE, AMYLASE in the last 168 hours. No results for input(s): AMMONIA in the last 168 hours. Coagulation Profile: No results for input(s): INR, PROTIME in the last 168 hours. Cardiac Enzymes: No results for input(s): CKTOTAL, CKMB, CKMBINDEX, TROPONINI in the last 168 hours. BNP (last 3 results) No results for input(s): PROBNP in the last 8760 hours. HbA1C: No results for input(s): HGBA1C in the last 72 hours. CBG: Recent Labs  Lab 08/15/17 1933 08/15/17 2120 08/16/17 0752 08/16/17 1139 08/17/17 1629  GLUCAP 245* 212* 121* 113* 106*   Lipid Profile: No results for input(s): CHOL, HDL, LDLCALC, TRIG, CHOLHDL, LDLDIRECT in the last 72 hours. Thyroid Function Tests: No results for input(s): TSH, T4TOTAL, FREET4, T3FREE, THYROIDAB in the last 72 hours. Anemia Panel: No results for input(s): VITAMINB12, FOLATE, FERRITIN, TIBC, IRON, RETICCTPCT in the last 72 hours. Sepsis Labs: Recent Labs  Lab 08/15/17 1618  PROCALCITON <0.10    Recent Results (from the past 240 hour(s))  Culture, blood (routine x  2) Call MD if unable to obtain prior to antibiotics being given     Status: None (Preliminary result)   Collection Time: 08/15/17  4:18 PM  Result Value Ref Range Status   Specimen Description BLOOD RIGHT HAND  Final   Special Requests   Final    BOTTLES DRAWN AEROBIC AND ANAEROBIC Blood Culture adequate volume  Culture   Final    NO GROWTH 4 DAYS Performed at [facility], [place] [address] [place], [place] [address]    Report Status PENDING  Incomplete  Culture, blood (Routine X 2) w Reflex to ID Panel     Status: None (Preliminary result)   Collection Time: 08/16/17  5:10 AM  Result Value Ref Range Status   Specimen Description BLOOD RIGHT HAND  Final   Special Requests   Final    BOTTLES DRAWN AEROBIC AND ANAEROBIC Blood Culture adequate volume   Culture   Final    NO GROWTH 3 DAYS Performed at [facility], [place] [address], [place], [place] [address]    Report Status PENDING  Incomplete  MRSA PCR Screening     Status: None   Collection Time: 08/16/17  6:41 PM  Result Value Ref Range Status   MRSA by PCR NEGATIVE NEGATIVE Final    Comment:        The GeneXpert MRSA Assay (FDA approved for NASAL specimens only), is one component of [facility] comprehensive MRSA colonization surveillance program. It is not intended to diagnose MRSA infection nor to guide or monitor treatment for MRSA infections. Performed at [facility], [place] [address], [place], [place] [address]          Radiology Studies: No results found.      Scheduled Meds: . apixaban  5 mg Oral BID  . aspirin EC  81 mg Oral Daily  . atorvastatin  10 mg Oral QPM  . azaTHIOprine  50 mg Oral BID  . docusate sodium  100 mg Oral BID  . fluticasone  1 spray Each Nare Daily  . furosemide  40 mg Oral Daily  . ipratropium-albuterol  3 mL Nebulization TID  . latanoprost  1 drop Both Eyes QHS  . levothyroxine  200 mcg Oral QAC breakfast  . losartan  12.5 mg Oral Daily  . magnesium oxide  400 mg Oral  BID  . metoprolol succinate  100 mg Oral Daily  . mometasone-formoterol  2 puff Inhalation BID  . oxyCODONE  30 mg Oral Q12H  . pantoprazole  40 mg Oral BID  . predniSONE  5 mg Oral Q breakfast  . psyllium  1 packet Oral QHS  . senna  1 tablet Oral QPM  . sertraline  150 mg Oral Daily  . sodium chloride flush  3 mL Intravenous Q12H  . temazepam  30 mg Oral QHS   Continuous Infusions: . ceFEPime (MAXIPIME) IV 2 g (08/20/17 4730)     LOS: 5 days    Time spent: over 30 min    Travis Helper, MD [facility] Pager [phone]  If 7PM-7AM, please contact night-coverage [email or web address] Password [facility] 08/20/2017, 12:46 PM

## 2017-08-21 LAB — CBC
HCT: 34.6 % — ABNORMAL LOW (ref 39.0–52.0)
Hemoglobin: 10.3 g/dL — ABNORMAL LOW (ref 13.0–17.0)
MCH: 26.2 pg (ref 26.0–34.0)
MCHC: 29.8 g/dL — AB (ref 30.0–36.0)
MCV: 88 fL (ref 78.0–100.0)
PLATELETS: 216 10*3/uL (ref 150–400)
RBC: 3.93 MIL/uL — ABNORMAL LOW (ref 4.22–5.81)
RDW: 16.4 % — AB (ref 11.5–15.5)
WBC: 10.1 10*3/uL (ref 4.0–10.5)

## 2017-08-21 LAB — BASIC METABOLIC PANEL
Anion gap: 8 (ref 5–15)
BUN: 16 mg/dL (ref 6–20)
CO2: 33 mmol/L — ABNORMAL HIGH (ref 22–32)
Calcium: 8.6 mg/dL — ABNORMAL LOW (ref 8.9–10.3)
Chloride: 99 mmol/L — ABNORMAL LOW (ref 101–111)
Creatinine, Ser: 0.81 mg/dL (ref 0.61–1.24)
GFR calc Af Amer: >60 mL/min (ref >60)
GLUCOSE: 94 mg/dL (ref 65–99)
POTASSIUM: 3.8 mmol/L (ref 3.5–5.1)
SODIUM: 140 mmol/L (ref 135–145)

## 2017-08-21 LAB — MAGNESIUM: MAGNESIUM: 1.9 mg/dL (ref 1.7–2.4)

## 2017-08-21 LAB — CULTURE, BLOOD (ROUTINE X 2)
Culture: NO GROWTH
Special Requests: ADEQUATE

## 2017-08-21 MED ORDER — ALBUTEROL SULFATE (2.5 MG/3ML) 0.083% IN NEBU: 2.5000 mg | INHALATION_SOLUTION | RESPIRATORY_TRACT | Status: DC | PRN

## 2017-08-21 MED ORDER — IPRATROPIUM-ALBUTEROL 0.5-2.5 (3) MG/3ML IN SOLN
3.0000 mL | Freq: Two times a day (BID) | RESPIRATORY_TRACT | Status: DC
  Administered 2017-08-22 – 2017-08-24 (×6): 3 mL via RESPIRATORY_TRACT
  Filled 2017-08-21 (×6): qty 3

## 2017-08-21 NOTE — Progress Notes (Signed)
PROGRESS NOTE    Travis Ross  HXT:[id number] DOB: 1926/04/06 DOA: 08/15/2017 PCP: [name], MD   Brief Narrative:  84 y.o. male with Travis Ross Past Medical History of afib; myasthenia gravis; hypothyroidism; HLD; hand paresthesias related to C-spine disease; glaucoma; HTN; and COPD who presents with SOB and recurrent respiratory illness since December.  He had lower extremity edema on exam at presentation concerning for HF exacerbation and had CT chest with findings c/w pneumonia.   Assessment & Plan:   Principal Problem:   Acute respiratory failure with hypoxia (HCC) Active Problems:   Hyperlipidemia   COPD with chronic bronchitis and emphysema (HCC)   GERD (gastroesophageal reflux disease)   Hypothyroidism (acquired)   History of Cervical spinal cord compression (HCC)   Myasthenia gravis (HCC)   Acute on chronic systolic heart failure (HCC)   Acute hypokalemia   Decubitus ulcer of sacral region, stage 1   Acute respiratory failure with hypoxia  -Patient presents with acute hypoxemic respiratory failure with recurrent episodes of dyspnea and shortness of breath previously treated as community-acquired pneumonia in the outpatient setting (6x since Oct 2018) - Possibly multifactorial with pneumonia on CT, but notable LE edema on exam which could be c/w HF exacerbation -Continue supportive care with oxygen and wean as tolerated noting was on RA prior to current symptoms (on 1 L O2 at present, improving) - consulted palliative care as he's declined speech evaluation again  Pneumonia: pt with LLL and RLL opacities as well as RUL nodular opacity on imaging.   -Obtain ESR (elevated), respiratory procalcitonin (normal), blood cultures (NGTD), urinary strep (negative), sputum culture (pending collection) - MRSA PCR negative - CT chest recommending f/u 8-12 weeks  - Continue cefepime (7 days), stop vancomycin - On discussion with daughters, pt has had recurrent bouts of pneumonia every few  weeks since December.  They estimate 5-6 episodes treated recurrently with levaquin.  Given his recurrent pneumonia and  CT findings here, will ask pulmonology to see him as well (had appt with Dr. Melvyn Novas in early July) - Pulm c/s, recommending dysphagia/aspiration w/u.  Follow up with pulmonology as outpatient.  Noted he would be high risk for bronchoscopy. - appreciate palliative care assistance  COPD with chronic bronchitis and emphysema  -Pt with transmitted upper airway sounds, don't think this is c/w COPD exacerbation -Abx for pneumonia as noted above -scheduled nebs, xopenex prn -Continue Dulera and Combivent for now  -Continue chronic prednisone 10 mg daily for now (pt taking 5 mg daily - this is ordered)    Myasthenia gravis  - He notes this has been stable with no exacerbation in about 2 years - Continue Imuran - avoid meds that could exacerbate MG  Dysphagia - pt declined speech evaluation again    Acute on chronic systolic heart failure  - Elevated BNP, lower extremity edema - lasix 60 IV BID -> switch to PO 40  - Repeat echo 6/19 with EF 40-45%, unable to eval LV diastolic function, mod increased PASP (see report) - improved from 01/2017 where EF was 30-35% -Continue beta blocker, losartan -Daily weights, strict I's/O  -Hypoalbuminemia.  Follow UA (neg for protein).   -Patient's peak weight during hospitalization in December 2018 was 210 lbs and weight at admission 182 lbs (current weight 207, weights seem to be inaccurate - net negative) -Patient dry weight unknown and patient reports at facility is given [name] diet high in sugar and salt -Consider cardiology consultation as needed  Wt Readings from Last 3 Encounters:  08/19/17 85.3 kg (188 lb)  02/08/17 94.3 kg (207 lb 14.3 oz)  12/15/16 86.2 kg (190 lb)      Acute hypokalemia  Hypomag - continue PO mag - follow K, stopped supplemental as d/c'ing diuresis    Chronic atrial fibrillation with RVR -with  intermittent RVR, metoprolol 100 mg daily (RVR improved) -eliquis    Decubitus ulcer of sacral region, stage 1 -Air mattress -Wound care consult -Albumin low-nutritional consultation  Left Tibial Wound: - occurred after skin procedure [name] couple months ago - wound care per wound care c/s, appreciate recs - outpatient follow up    Hyperlipidemia -Continue Lipitor    GERD (gastroesophageal reflux disease) -Continue Protonix    Hypothyroidism (acquired) -Continue Synthroid -TSH (wnl)    History of Cervical spinal cord compression  -Patient has chronic paresthesias (family notes at least for 1-2 years) making it difficult to perform any ADLs including something as simple as holding urinal -Condom catheter   Anemia: chronic, follow  Thrombocytopenia: improved  Leukocytosis: 2/2 above, follow  DVT prophylaxis: eliquis Code Status: DNR Family Communication: discussed with daughter [name] over phone on 6/22.  Discussed with [name] and [name] today at bedside on 6/23.  Discussed with [name] on phone 6/24. Disposition Plan: pending improvement   Consultants:   none  Procedures:   none  Antimicrobials: Anti-infectives (From admission, onward)   Start     Dose/Rate Route Frequency Ordered Stop   08/16/17 1800  azithromycin (ZITHROMAX) 500 mg in sodium chloride 0.9 % 250 mL IVPB  Status:  Discontinued     500 mg 250 mL/hr over 60 Minutes Intravenous Every 24 hours 08/15/17 1700 08/15/17 1732   08/16/17 0630  vancomycin (VANCOCIN) IVPB 1000 mg/200 mL premix  Status:  Discontinued     1,000 mg 200 mL/hr over 60 Minutes Intravenous Every 12 hours 08/15/17 1750 08/17/17 0759   08/16/17 0000  cefTRIAXone (ROCEPHIN) 1 g in sodium chloride 0.9 % 100 mL IVPB  Status:  Discontinued     1 g 200 mL/hr over 30 Minutes Intravenous Every 24 hours 08/15/17 1700 08/15/17 1732   08/15/17 1900  ceFEPIme (MAXIPIME) 2 g in sodium chloride 0.9 % 100 mL IVPB     2 g 200 mL/hr over 30 Minutes  Intravenous Every 12 hours 08/15/17 1750 08/22/17 1859   08/15/17 1830  vancomycin (VANCOCIN) 1,500 mg in sodium chloride 0.9 % 500 mL IVPB     1,500 mg 250 mL/hr over 120 Minutes Intravenous  Once 08/15/17 1750 08/16/17 0909   08/15/17 1545  cefTRIAXone (ROCEPHIN) 1 g in sodium chloride 0.9 % 100 mL IVPB     1 g 200 mL/hr over 30 Minutes Intravenous  Once 08/15/17 1537 08/15/17 1638   08/15/17 1545  azithromycin (ZITHROMAX) 500 mg in sodium chloride 0.9 % 250 mL IVPB     500 mg 250 mL/hr over 60 Minutes Intravenous  Once 08/15/17 1537 08/15/17 1911     Subjective: Feels run down.  Coughing Geoge Lawrance bit.  Objective: Vitals:   08/21/17 0728 08/21/17 0908 08/21/17 1555 08/21/17 1627  BP: 134/85   135/87  Pulse: (!) 51   84  Resp: 16   14  Temp: 98.1 F (36.7 C)   98.4 F (36.9 C)  TempSrc: Oral   Oral  SpO2: 97% 97% 96% 95%  Weight:      Height:        Intake/Output Summary (Last 24 hours) at 08/21/2017 1817 Last data filed at 08/21/2017 1300 Gross  per 24 hour  Intake 200 ml  Output 1200 ml  Net -1000 ml   Filed Weights   08/17/17 0600 08/18/17 0500 08/19/17 0500  Weight: 93.9 kg (207 lb) 85.3 kg (188 lb) 85.3 kg (188 lb)    Examination:  General: No acute distress. Cardiovascular: Heart sounds irregularly irregular, normal rate Lungs: Transmitted upper airway sounds, on 1 L, no increased WOB Abdomen: Soft, nontender, nondistended with normal active bowel sounds. No masses. No hepatosplenomegaly. Neurological: Alert and oriented 3. Moves all extremities 4 . Cranial nerves II through XII grossly intact. Skin: LE wound not examined Extremities: improved edema Psychiatric: Mood and affect are normal. Insight and judgment are appropriae.   Data Reviewed: I have personally reviewed following labs and imaging studies  CBC: Recent Labs  Lab 08/17/17 0350 08/18/17 0232 08/19/17 0410 08/20/17 0453 08/21/17 0343  WBC 12.4* 9.4 9.8 9.8 10.1  HGB 9.8* 9.7* 10.0* 10.3*  10.3*  HCT 33.3* 32.7* 33.9* 34.8* 34.6*  MCV 90.2 88.1 89.2 89.0 88.0  PLT 170 162 171 196 580   Basic Metabolic Panel: Recent Labs  Lab 08/17/17 0350 08/18/17 0232 08/19/17 0410 08/20/17 0453 08/21/17 0343  NA 142 140 141 139 140  K 3.7 3.5 4.1 3.9 3.8  CL 103 99* 101 99* 99*  CO2 29 33* 33* 32 33*  GLUCOSE 109* 97 104* 100* 94  BUN 23* 22* '19 19 16  '$ CREATININE 0.98 0.76 0.77 0.80 0.81  CALCIUM 8.4* 8.3* 8.6* 8.6* 8.6*  MG 1.8 1.7 1.9 1.8 1.9   GFR: Estimated Creatinine Clearance: 71.2 mL/min (by C-G formula based on SCr of 0.81 mg/dL). Liver Function Tests: Recent Labs  Lab 08/15/17 1229 08/17/17 0350  AST 22 25  ALT 12* 15*  ALKPHOS 111 112  BILITOT 1.5* 1.0  PROT 6.2* 5.8*  ALBUMIN 2.8* 2.7*   No results for input(s): LIPASE, AMYLASE in the last 168 hours. No results for input(s): AMMONIA in the last 168 hours. Coagulation Profile: No results for input(s): INR, PROTIME in the last 168 hours. Cardiac Enzymes: No results for input(s): CKTOTAL, CKMB, CKMBINDEX, TROPONINI in the last 168 hours. BNP (last 3 results) No results for input(s): PROBNP in the last 8760 hours. HbA1C: No results for input(s): HGBA1C in the last 72 hours. CBG: Recent Labs  Lab 08/15/17 1933 08/15/17 2120 08/16/17 0752 08/16/17 1139 08/17/17 1629  GLUCAP 245* 212* 121* 113* 106*   Lipid Profile: No results for input(s): CHOL, HDL, LDLCALC, TRIG, CHOLHDL, LDLDIRECT in the last 72 hours. Thyroid Function Tests: No results for input(s): TSH, T4TOTAL, FREET4, T3FREE, THYROIDAB in the last 72 hours. Anemia Panel: No results for input(s): VITAMINB12, FOLATE, FERRITIN, TIBC, IRON, RETICCTPCT in the last 72 hours. Sepsis Labs: Recent Labs  Lab 08/15/17 1618  PROCALCITON <0.10    Recent Results (from the past 240 hour(s))  Culture, blood (routine x 2) Call MD if unable to obtain prior to antibiotics being given     Status: None   Collection Time: 08/15/17  4:18 PM  Result Value  Ref Range Status   Specimen Description BLOOD RIGHT HAND  Final   Special Requests   Final    BOTTLES DRAWN AEROBIC AND ANAEROBIC Blood Culture adequate volume   Culture   Final    NO GROWTH 5 DAYS Performed at [facility], [address] [place], [place] [address]    Report Status 08/20/2017 FINAL  Final  Culture, blood (Routine X 2) w Reflex to ID Panel  Status: None   Collection Time: 08/16/17  5:10 AM  Result Value Ref Range Status   Specimen Description BLOOD RIGHT HAND  Final   Special Requests   Final    BOTTLES DRAWN AEROBIC AND ANAEROBIC Blood Culture adequate volume   Culture   Final    NO GROWTH 5 DAYS Performed at [facility], [address], [place], [place] [address]    Report Status 08/21/2017 FINAL  Final  MRSA PCR Screening     Status: None   Collection Time: 08/16/17  6:41 PM  Result Value Ref Range Status   MRSA by PCR NEGATIVE NEGATIVE Final    Comment:        The GeneXpert MRSA Assay (FDA approved for NASAL specimens only), is one component of [facility] comprehensive MRSA colonization surveillance program. It is not intended to diagnose MRSA infection nor to guide or monitor treatment for MRSA infections. Performed at [facility], [place] [address], [place],  [address]          Radiology Studies: No results found.      Scheduled Meds: . apixaban  5 mg Oral BID  . aspirin EC  81 mg Oral Daily  . atorvastatin  10 mg Oral QPM  . azaTHIOprine  50 mg Oral BID  . docusate sodium  100 mg Oral BID  . fluticasone  1 spray Each Nare Daily  . furosemide  40 mg Oral Daily  . ipratropium-albuterol  3 mL Nebulization TID  . latanoprost  1 drop Both Eyes QHS  . levothyroxine  200 mcg Oral QAC breakfast  . losartan  12.5 mg Oral Daily  . magnesium oxide  400 mg Oral BID  . metoprolol succinate  100 mg Oral Daily  . mometasone-formoterol  2 puff Inhalation BID  . oxyCODONE  30 mg Oral Q12H  . pantoprazole  40 mg Oral BID  .  predniSONE  5 mg Oral Q breakfast  . psyllium  1 packet Oral QHS  . senna  1 tablet Oral QPM  . sertraline  150 mg Oral Daily  . sodium chloride flush  3 mL Intravenous Q12H  . temazepam  30 mg Oral QHS   Continuous Infusions: . ceFEPime (MAXIPIME) IV 2 g (08/21/17 0650)     LOS: 6 days    Time spent: over 30 min    Fayrene Helper, MD [facility] Pager [phone]  If 7PM-7AM, please contact night-coverage [email or web address] Password [facility] 08/21/2017, 6:17 PM

## 2017-08-21 NOTE — Plan of Care (Signed)
Patient remained injury free throughout the night. His call bell remained in arms reach to call out for assistance easily. He denied having any unmet needs. Will continue to monitor for safety.

## 2017-08-21 NOTE — Consult Note (Addendum)
Consultation Note Date: 08/21/2017   Patient Name: Travis Ross  DOB: [birth year]  MRN: [id number]  Age / Sex: 84 y.o., male  PCP: Travis Poll, MD Referring Physician: Elodia Ross., *  Reason for Consultation: Establishing goals of care  HPI/Patient Profile: Travis Ross is a 84 y.o. male with myasthenia gravis and cervical spine disease who has had recurrent pneumonias over the past 6 months that have been associated with CHF exacerbations.   Clinical Assessment and Goals of Care: Patient is sitting up in bed watching t.v. He states he has 6 children and was widowed around 8 years ago. 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 He lives in assisted living.    He states he has had several spine surgeries, and "saw angels twice due to complications".  He has weakness in his arms and hands. He is able to feed himself but uses large handled utensils. He states he can almost stand. He states he does not eat well because he does not like the taste of the [place] foods that are served.   He states around 10 years ago, he had difficulty swallowing and could not drink water for 6 months. He tells me he had intensive swallow therapy, but never developed PNA, so his recurrent PNA is not due to swallowing,and therefore will not undergo further testing. He states he had asthma as a child and believes the PNA is due to lung disease.    A detailed discussion was had today regarding advanced directives.  Concepts specific to code status, artifical feeding and hydration,  IV antibiotics and rehospitalization was discussed.  The difference between an aggressive medical intervention path and a hospice comfort care path was discussed.  Values and goals of care important to patient and family were attempted to be elicited.  He states he would never want a feeding tube or dialysis, and  states "I don't want attempts to save me if I'm dying". He further states " I do not want anything that prolongs my life, and I'm ready to go when it's time, but I am afraid of dying."  He states he is a [place], but has some doubts. He states "I would love to have Dr. Zettie Ross for a doctor". Upon further exploaration, he states he would like to continue to treat the treatable. He would like to continue abx as needed, and would want to return to the hospital if needed for treatment.    His daughter Travis Ross states at his facility he is getting highest level of care. She states his quality of life is eating. She states there are 6 siblings but only 2, she and her sister even have contact with him.  SUMMARY OF RECOMMENDATIONS   Family meeting tomorrow at 1:30.   MD updated.    Code Status/Advance Care Planning:  DNR    Symptom Management:   No complaints at this time.  Palliative Prophylaxis:   Eye Care and Oral Care  Psycho-social/Spiritual:   Desire for further Chaplaincy  support: yes  Prognosis:   Poor long term. Low albumin, recurrent PNA, myasthenia gravis, CHF   Discharge Planning: To Be Determined      Primary Diagnoses: Present on Admission: . Acute respiratory failure with hypoxia ([name]) . Hyperlipidemia . COPD with chronic bronchitis and emphysema ([place]) . GERD (gastroesophageal reflux disease) . Hypothyroidism (acquired) . History of Cervical spinal cord compression (HCC) . Myasthenia gravis ([place]) . Acute on chronic systolic heart failure ([place]) . Acute hypokalemia . Decubitus ulcer of sacral region, stage 1   I have reviewed the medical record, interviewed the patient and family, and examined the patient. The following aspects are pertinent.  Past Medical History:  Diagnosis Date  . CAP (community acquired pneumonia) 02/04/2017  . COPD with chronic bronchitis and emphysema ([place])   . Essential hypertension   . Femur fracture, right ([place])    Related to a  fall. Periprosthetic distal femur fracture (close to the right knee prosthesis).  > Initial plans were to treat with brace, however, has now progressed and may require surgery  . GERD (gastroesophageal reflux disease)   . Glaucoma   . History of Cervical spinal cord compression w/ residual hand parestheisa   . Hyperlipidemia   . Hypothyroidism (acquired)    On Levothyroxine  . Myasthenia gravis ([place])    Now (prior to recent femur fracture) was relegated to maneuvering himself around on a Rolling Walker (Rollator) using his feet & sitting on the walker.  Does not have arm strength to mover a wheelchair or transfer.;   . Osteoarthritis    Global osteoarthritis involving back, hips and knees as well as elbows.  . Paresthesia    bilateral upper extremities, very weak ; poor grip  (must use adaptable silverware );   . Permanent atrial fibrillation (HCC)    Long-standing (> 4 yrs). Initially evaluated with Stress Test & Echo - Pt reports that these studies were "normal"   Social History   Socioeconomic History  . Marital status: Widowed    Spouse name: Not on file  . Number of children: 6  . Years of education: 14  . Highest education level: Not on file  Occupational History  . 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 Financial resource strain: Not on file  . Food insecurity:    Worry: Not on file    Inability: Not on file  . Transportation needs:    Medical: Not on file    Non-medical: Not on file  Tobacco Use  . Smoking status: Never Smoker  . Smokeless tobacco: Never Used  Substance and Sexual Activity  . Alcohol use: No  . Drug use: No  . Sexual activity: Never  Lifestyle  . Physical activity:    Days per week: Not on file    Minutes per session: Not on file  . Stress: Not on file  Relationships  . Social connections:    Talks on phone: Not on file    Gets together: Not on file    Attends religious service: Not on file     Active member of club or organization: Not on file    Attends meetings of clubs or organizations: Not on file    Relationship status: Not on file  Other Topics Concern  . Not on file  Social History Narrative   He is a widower, who is recently moved down to [place] to live in assisted living facility to his  daughter.   He has 6 total children, 7 grandchildren and 2 great-grandchildren.    He currently lives in assisted living facility, and is essentially immobilized.    Prior to his fall and leg fracture, he was able to maneuver himself around using a rolling walker, however since the fall and fracture, he is now essentially immobile requiring assistance for maneuvering him on a wheelchair. He is right leg is in an immobilizer brace.       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   Family History  Problem Relation Age of Onset  . Other Mother        Polio  . Heart failure Father   . Arthritis Sister   . Arthritis Brother    Scheduled Meds: . apixaban  5 mg Oral BID  . aspirin EC  81 mg Oral Daily  . atorvastatin  10 mg Oral QPM  . azaTHIOprine  50 mg Oral BID  . docusate sodium  100 mg Oral BID  . fluticasone  1 spray Each Nare Daily  . furosemide  40 mg Oral Daily  . ipratropium-albuterol  3 mL Nebulization TID  . latanoprost  1 drop Both Eyes QHS  . levothyroxine  200 mcg Oral QAC breakfast  . losartan  12.5 mg Oral Daily  . magnesium oxide  400 mg Oral BID  . metoprolol succinate  100 mg Oral Daily  . mometasone-formoterol  2 puff Inhalation BID  . oxyCODONE  30 mg Oral Q12H  . pantoprazole  40 mg Oral BID  . predniSONE  5 mg Oral Q breakfast  . psyllium  1 packet Oral QHS  . senna  1 tablet Oral QPM  . sertraline  150 mg Oral Daily  . sodium chloride flush  3 mL Intravenous Q12H  . temazepam  30 mg Oral QHS   Continuous Infusions: . ceFEPime (MAXIPIME) IV 2 g (08/21/17 0650)   PRN Meds:.acetaminophen **OR** acetaminophen, levalbuterol,  ondansetron **OR** ondansetron (ZOFRAN) IV Medications Prior to Admission:  Prior to Admission medications   Medication Sig Start Date End Date Taking? Authorizing Provider  acetaminophen (TYLENOL) 325 MG tablet Take 650 mg by mouth every 6 (six) hours as needed for moderate pain.   Yes [provider]  aspirin EC 81 MG tablet Take 81 mg by mouth daily.   Yes [provider]  atorvastatin (LIPITOR) 10 MG tablet Take 10 mg by mouth every evening.  10/28/16  Yes [provider]  azaTHIOprine (IMURAN) 50 MG tablet Take 50 mg by mouth 2 (two) times daily.   Yes [provider]  benzonatate (TESSALON) 100 MG capsule Take 100 mg by mouth at bedtime.   Yes [provider]  Brinzolamide-Brimonidine (SIMBRINZA) 1-0.2 % SUSP Place 1 drop into both eyes 2 (two) times daily.   Yes [provider]  clopidogrel (PLAVIX) 75 MG tablet Take 75 mg by mouth 2 (two) times a week.  10/28/16  Yes [provider]  docusate sodium (COLACE) 100 MG capsule Take 100 mg by mouth 2 (two) times daily.   Yes [provider]  DULERA 100-5 MCG/ACT AERO Take 2 puffs by mouth 2 (two) times daily.  10/10/16  Yes [provider]  fluticasone (FLONASE) 50 MCG/ACT nasal spray Place 1 spray into both nostrils daily.   Yes [provider]  furosemide (LASIX) 40 MG tablet Take 40 mg by mouth daily. 11/09/16  Yes [provider]  guaiFENesin ([place]) 600 MG 12  hr tablet Take 600 mg by mouth 2 (two) times daily.   Yes [provider]  Ipratropium-Albuterol (COMBIVENT RESPIMAT) 20-100 MCG/ACT AERS respimat Inhale 2 puffs into the lungs 2 (two) times daily.    Yes [provider]  levothyroxine (SYNTHROID, LEVOTHROID) 200 MCG tablet Take 200 mcg by mouth daily.  10/31/16  Yes [provider]  losartan (COZAAR) 25 MG tablet Take 0.5 tablets (12.5 mg total) by mouth daily. Patient taking differently: Take 50 mg by mouth  daily.  02/08/17  Yes Katherine Roan, MD  magnesium hydroxide (MILK OF MAGNESIA) 400 MG/5ML suspension Take 30 mLs by mouth See admin instructions. every 24 hours as needed for constipation.   Yes [provider]  metoprolol succinate (TOPROL-XL) 50 MG 24 hr tablet Take 50 mg by mouth daily. 10/28/16  Yes [provider]  oxyCODONE (OXYCONTIN) 30 MG 12 hr tablet Take 1 tablet (30 mg total) by mouth every 12 (twelve) hours. 12/20/16  Yes Perkins, Alexzandrew L, PA-C  pantoprazole (PROTONIX) 40 MG tablet Take 40 mg by mouth 2 (two) times daily.  10/28/16  Yes [provider]  polycarbophil (FIBERCON) 625 MG tablet Take 1 tablet (625 mg total) by mouth at bedtime. 12/20/16  Yes Perkins, Alexzandrew L, PA-C  potassium chloride 20 MEQ/15ML (10%) SOLN Take 7.5 mLs by mouth daily. 08/05/17  Yes [provider]  predniSONE (DELTASONE) 10 MG tablet Take 1 tablet (10 mg total) by mouth daily with breakfast. Patient taking differently: Take 5 mg by mouth daily with breakfast.  02/07/17  Yes Winfrey, Jenne Pane, MD  Psyllium (FIBER) 0.52 g CAPS Take 0.52 g by mouth at bedtime.   Yes [provider]  senna (SENOKOT) 8.6 MG tablet Take 1 tablet by mouth every evening.   Yes [provider]  sertraline (ZOLOFT) 100 MG tablet Take 150 mg by mouth daily.  10/28/16  Yes [provider]  temazepam (RESTORIL) 30 MG capsule Take 30 mg by mouth at bedtime.  11/09/16  Yes [provider]  TRAVATAN Z 0.004 % SOLN ophthalmic solution Place 1 drop into both eyes at bedtime.  11/09/16  Yes [provider]   No Known Allergies Review of Systems  Gastrointestinal:       Poor appetite here in the hospital.     Physical Exam  Constitutional: No distress.  Pulmonary/Chest: Effort normal.  Neurological: He is alert.  Oriented.   Skin: Skin is warm and dry.    Vital Signs: BP 134/85   Pulse (!) 51   Temp 98.1 F (36.7 C) (Oral)   Resp 16    Ht 6\' 1"  (1.854 m)   Wt 85.3 kg (188 lb)   SpO2 97%   BMI 24.80 kg/m  Pain Scale: 0-10   Pain Score: 5    SpO2: SpO2: 97 % O2 Device:SpO2: 97 % O2 Flow Rate: .O2 Flow Rate (L/min): 1 L/min  IO: Intake/output summary:   Intake/Output Summary (Last 24 hours) at 08/21/2017 1258 Last data filed at 08/21/2017 1051 Gross per 24 hour  Intake 200 ml  Output 1175 ml  Net -975 ml    LBM: Last BM Date: 08/21/17 Baseline Weight: Weight: 82.6 kg (182 lb) Most recent weight: Weight: 85.3 kg (188 lb)     Palliative Assessment/Data: 40%     Time In: 12:40 Time Out: 1:50 Time Total: 70 min Greater than 50%  of this time was spent counseling and coordinating care related to the above assessment and  plan.  Signed by: Asencion Gowda, NP   Please contact Palliative Medicine Team phone at [phone] for questions and concerns.  For individual provider: See Shea Evans

## 2017-08-21 NOTE — Progress Notes (Signed)
PT Cancellation Note  Patient Details Name: Travis Ross MRN: [id number] DOB: 08/14/1926   Cancelled Treatment:    Reason Eval/Treat Not Completed: Other (comment). Pt consulting with palliative medicine on arrival to room. Will check back as time allows.  Benjiman Core, PTA Pager [phone] Acute Rehab  Allena Katz 08/21/2017, 2:36 PM

## 2017-08-21 NOTE — Progress Notes (Signed)
Chaplain- visited pt in room per [facility] consult request.  Offered conversation with patient and pt talked about life and family history of father and self and grwoing up as [place] child.  Pt discussed lengthy medical history of recent years, and moving down from [place].  Discussed challenges on building new friendships in rehab and asst'd living, and the sadness of loss from those due to age and illness as well.  Pt also described his religious history as being baptized Episcopalian in the [facility], as well as attending Catholic mass for many years with his wife.  Pt thanked chaplain for visit as staff came to take care of medical needs.  Offered further support as desired.

## 2017-08-22 ENCOUNTER — Inpatient Hospital Stay (HOSPITAL_COMMUNITY): Payer: Medicare HMO

## 2017-08-22 DIAGNOSIS — Z515 Encounter for palliative care: Secondary | ICD-10-CM

## 2017-08-22 DIAGNOSIS — Z7189 Other specified counseling: Secondary | ICD-10-CM

## 2017-08-22 LAB — BASIC METABOLIC PANEL
Anion gap: 12 (ref 5–15)
BUN: 15 mg/dL (ref 8–23)
CHLORIDE: 95 mmol/L — AB (ref 98–111)
CO2: 32 mmol/L (ref 22–32)
CREATININE: 0.82 mg/dL (ref 0.61–1.24)
Calcium: 8.9 mg/dL (ref 8.9–10.3)
Glucose, Bld: 104 mg/dL — ABNORMAL HIGH (ref 70–99)
Potassium: 3.8 mmol/L (ref 3.5–5.1)
SODIUM: 139 mmol/L (ref 135–145)

## 2017-08-22 LAB — CBC
HEMATOCRIT: 37.9 % — AB (ref 39.0–52.0)
HEMOGLOBIN: 11.2 g/dL — AB (ref 13.0–17.0)
MCH: 26.3 pg (ref 26.0–34.0)
MCHC: 29.6 g/dL — ABNORMAL LOW (ref 30.0–36.0)
MCV: 89 fL (ref 78.0–100.0)
Platelets: 222 10*3/uL (ref 150–400)
RBC: 4.26 MIL/uL (ref 4.22–5.81)
RDW: 16.3 % — AB (ref 11.5–15.5)
WBC: 12.6 10*3/uL — AB (ref 4.0–10.5)

## 2017-08-22 LAB — GLUCOSE, CAPILLARY: Glucose-Capillary: 117 mg/dL — ABNORMAL HIGH (ref 70–99)

## 2017-08-22 LAB — MAGNESIUM: MAGNESIUM: 1.8 mg/dL (ref 1.7–2.4)

## 2017-08-22 LAB — BRAIN NATRIURETIC PEPTIDE: B NATRIURETIC PEPTIDE 5: 436.6 pg/mL — AB (ref 0.0–100.0)

## 2017-08-22 MED ORDER — FUROSEMIDE 10 MG/ML IJ SOLN
40.0000 mg | Freq: Once | INTRAMUSCULAR | Status: AC
  Administered 2017-08-22: 40 mg via INTRAVENOUS
  Filled 2017-08-22: qty 4

## 2017-08-22 NOTE — Progress Notes (Addendum)
Daily Progress Note   Patient Name: Travis Ross       Date: 08/22/2017 DOB: [birth year]  Age: 84 y.o. MRN#: [id number] Attending Physician: Elodia Florence., * Primary Care Physician: [name], MD Admit Date: 08/15/2017  Reason for Consultation/Follow-up: Establishing goals of care  Subjective: Met with patient and daughter Travis Ross at bedside who is a retired Forensic psychologist. We discussed GOC. It was noted initially during conversation today, he misunderstood some of the questions where he appeared to answer with an appropriate answer, however upon his daughter asking him to repeat what was stated or asked, he was unable to. Questions were simplified with frequent checks for understanding. He states he would never want chest compressions, shocks, or intubation for cardio/pulmonary arrest. He would never want to be placed on a ventilator, dialysis, or a feeding tube. He states if he were to have cancer he would not want radiation or chemo.   [name] states he would like a swallow study, and although he loves food, he would be willing to modify his food choices if it meant prolonging his life, and to find things he can eat to prevent shortness of breath. He states he has problems with eating meat and other firm texture foods. He had dysphagia around 15 years ago that required swallow therapy.    If the recurrent PNA is secondary to silent aspiration, he would want swallow therapy. If the PNA is not secondary to aspiration, he may want to pursue comfort care.   Unable to reach daughter Travis Ross to assess for questions.   Length of Stay: 7  Current Medications: Scheduled Meds:  . apixaban  5 mg Oral BID  . aspirin EC  81 mg Oral Daily  . atorvastatin  10 mg Oral QPM  . azaTHIOprine  50 mg Oral  BID  . docusate sodium  100 mg Oral BID  . fluticasone  1 spray Each Nare Daily  . furosemide  40 mg Oral Daily  . ipratropium-albuterol  3 mL Nebulization BID  . latanoprost  1 drop Both Eyes QHS  . levothyroxine  200 mcg Oral QAC breakfast  . losartan  12.5 mg Oral Daily  . magnesium oxide  400 mg Oral BID  . metoprolol succinate  100 mg Oral Daily  . mometasone-formoterol  2 puff Inhalation BID  .  oxyCODONE  30 mg Oral Q12H  . pantoprazole  40 mg Oral BID  . predniSONE  5 mg Oral Q breakfast  . psyllium  1 packet Oral QHS  . senna  1 tablet Oral QPM  . sertraline  150 mg Oral Daily  . sodium chloride flush  3 mL Intravenous Q12H  . temazepam  30 mg Oral QHS    Continuous Infusions:   PRN Meds: acetaminophen **OR** acetaminophen, albuterol, levalbuterol, ondansetron **OR** ondansetron (ZOFRAN) IV  Physical Exam  Constitutional: No distress.  Pulmonary/Chest: Breath sounds normal.  Neurological: He is alert.            Vital Signs: BP (!) 151/87   Pulse 83   Temp 98.5 F (36.9 C) (Oral)   Resp 14   Ht '6\' 1"'$  (1.854 m)   Wt 85.3 kg (188 lb)   SpO2 96%   BMI 24.80 kg/m  SpO2: SpO2: 96 % O2 Device: O2 Device: Nasal Cannula O2 Flow Rate: O2 Flow Rate (L/min): 2 L/min  Intake/output summary:   Intake/Output Summary (Last 24 hours) at 08/22/2017 1452 Last data filed at 08/22/2017 0900 Gross per 24 hour  Intake 480 ml  Output 1300 ml  Net -820 ml   LBM: Last BM Date: 08/21/17 Baseline Weight: Weight: 82.6 kg (182 lb) Most recent weight: Weight: 85.3 kg (188 lb)       Palliative Assessment/Data: 40%    Flowsheet Rows     Most Recent Value  Intake Tab  Referral Department  Hospitalist  Unit at Time of Referral  Med/Surg Unit  Palliative Care Primary Diagnosis  Cardiac  Date Notified  08/21/17  Palliative Care Type  New Palliative care  Reason for referral  Clarify Goals of Care  Date of Admission  08/15/17  Date first seen by Palliative Care  08/21/17    # of days Palliative referral response time  0 Day(s)  # of days IP prior to Palliative referral  6  Clinical Assessment  Psychosocial & Spiritual Assessment  Palliative Care Outcomes      Patient Active Problem List   Diagnosis Date Noted  . Acute respiratory failure with hypoxia ([place]) 08/15/2017  . Hyperlipidemia 08/15/2017  . COPD with chronic bronchitis and emphysema ([place]) 08/15/2017  . GERD (gastroesophageal reflux disease) 08/15/2017  . Hypothyroidism (acquired) 08/15/2017  . History of Cervical spinal cord compression (HCC) 08/15/2017  . Myasthenia gravis ([place]) 08/15/2017  . Acute on chronic systolic heart failure ([place]) 08/15/2017  . Acute hypokalemia 08/15/2017  . Decubitus ulcer of sacral region, stage 1 08/15/2017  . Pressure injury of sacral region, stage 2 02/06/2017  . Acute on chronic systolic (congestive) heart failure ([place]) 02/06/2017  . Community acquired pneumonia 02/04/2017  . A-fib ([place])   . Periprosthetic fracture around internal prosthetic right knee joint 12/15/2016  . Failed total knee arthroplasty ([place]) 12/15/2016  . Permanent atrial fibrillation ([place]) 12/05/2016  . Pre-operative cardiovascular examination 12/05/2016  . Venous stasis of both lower extremities 12/05/2016  . Essential hypertension     Palliative Care Assessment & Plan   Patient Profile: Travis Ross a 84 y.o.malewith myasthenia gravis and cervical spine disease who has had recurrent pneumonias over the past 6 months that have been associated with CHF exacerbations.   Assessment/Recommendations/Plan:  Recommend modified barium swallow study to determine defenitively if silent aspiration is present. If there is no aspiration, may move toward hospice care. If aspiration is present, would want an appropriate diet with swallow therapy.  Patient  is amenable to speaking with his daughters. Recommend discussing plans routinely with daughters and discussing care with patient as simply  as possible for understanding.      Code Status:    Code Status Orders  (From admission, onward)        Start     Ordered   08/15/17 1630  Do not attempt resuscitation (DNR)  Continuous    Question Answer Comment  In the event of cardiac or respiratory ARREST Do not call a "code blue"   In the event of cardiac or respiratory ARREST Do not perform Intubation, CPR, defibrillation or ACLS   In the event of cardiac or respiratory ARREST Use medication by any route, position, wound care, and other measures to relive pain and suffering. May use oxygen, suction and manual treatment of airway obstruction as needed for comfort.      08/15/17 1631    Code Status History    Date Active Date Inactive Code Status Order ID Comments User Context   02/04/2017 0930 02/09/2017 0035 Partial Code [id number]  Alphonzo Grieve, MD ED   12/15/2016 1842 12/22/2016 1658 Full Code [id number]  Gaynelle Arabian, MD Inpatient    Advance Directive Documentation     Most Recent Value  Type of Advance Directive  Out of facility DNR (pink MOST or yellow form), Living will, Healthcare Power of Attorney  Pre-existing out of facility DNR order (yellow form or pink MOST form)  -  "MOST" Form in Place?  -       Prognosis:   Unable to determine  Discharge Planning:  To Be Determined  Care plan was discussed with Dr. Florene Glen.  Thank you for allowing the Palliative Medicine Team to assist in the care of this patient.   Time In: 1:30 Time Out: 3:10 Total Time 1 hour 40 minutes Prolonged Time Billed  100 min      Greater than 50%  of this time was spent counseling and coordinating care related to the above assessment and plan.  Asencion Gowda, NP  Please contact Palliative Medicine Team phone at [phone] for questions and concerns.

## 2017-08-22 NOTE — Progress Notes (Signed)
CSW informed by [facility] that patient's insurance has denied him for SNF stay since pt appears to be close to baseline level of functioning  CSW updated patient- he is agreeable to returning to ALF since SNF is not an option at this time  CSW called [facility] to give updates and inform of possible DC tomorrow- nursing director will come to hospital to evaluate and ensure they can manage pt at their facility  Jorge Ny, Spray Worker [phone]

## 2017-08-22 NOTE — Progress Notes (Signed)
RN assumed care at 0500. Pt complained of nausea at 0515, pt given zofran and ginger ale and HOB elevated to 30 degrees. At [place] pt reported coughing up dark red substance into napkin and showed napkin to RN. Substance does appear to be red in nature. On-call provider, Baltazar Najjar, NP notified. Will continue to monitor. Clint Bolder, RN 08/22/17 6:30 AM

## 2017-08-22 NOTE — Care Management Important Message (Signed)
Important Message  Patient Details  Name: Travis Ross MRN: [id number] Date of Birth: 04-Nov-1926   Medicare Important Message Given:  Yes    [name] Montine Circle 08/22/2017, 3:35 PM

## 2017-08-22 NOTE — Progress Notes (Signed)
Patient refused O2 right now. States that it was on all day so he is fine. He is stating 94% on RA.

## 2017-08-22 NOTE — Progress Notes (Signed)
Physical Therapy Treatment Patient Details Name: Travis Ross MRN: [id number] DOB: [birth year] Today's Date: 08/22/2017    History of Present Illness 84 y.o. male with a Past Medical History of afib; myasthenia gravis; hypothyroidism; HLD; hand paresthesias related to C-spine disease; glaucoma; HTN; and COPD who presents with SOB and recurrent respiratory illness since December.  He had lower extremity edema on exam at presentation concerning for HF exacerbation and had CT chest with findings c/w pneumonia.     PT Comments    Pt able to tolerate lateral scoot transfer to the chair today. Pt con't to have difficulty with fine motor and dexterity in bilat hands but continues to try to use his arms for support and to aide with mobility. Acute PT to con't to follow.   Follow Up Recommendations  SNF     Equipment Recommendations  None recommended by PT    Recommendations for Other Services       Precautions / Restrictions Precautions Precautions: Fall Restrictions Weight Bearing Restrictions: No    Mobility  Bed Mobility Overal bed mobility: Needs Assistance Bed Mobility: Supine to Sit     Supine to sit: Min assist;+2 for safety/equipment;HOB elevated     General bed mobility comments: pt able to use bed rails to pull trunk up on despite weak grip and dexterity dysfunction, minA for LE management of EOB and to scoot to EOB  Transfers Overall transfer level: Needs assistance Equipment used: (lateral scoot using bed pad) Transfers: Lateral/Scoot Transfers          Lateral/Scoot Transfers: Max assist;+2 physical assistance General transfer comment: using pad underneath patient and maxAx2, lateral scoot to chair was completed, pt did use his hands but had difficulty due to impaired dexterity  Ambulation/Gait             General Gait Details: non ambulatory   Stairs             Wheelchair Mobility    Modified Rankin (Stroke Patients Only)        Balance Overall balance assessment: Needs assistance Sitting-balance support: Feet supported;Bilateral upper extremity supported Sitting balance-Leahy Scale: Fair                                      Cognition Arousal/Alertness: Awake/alert Behavior During Therapy: WFL for tasks assessed/performed Overall Cognitive Status: Within Functional Limits for tasks assessed                                        Exercises      General Comments General comments (skin integrity, edema, etc.): pt with report of 2 sores on bottom      Pertinent Vitals/Pain Pain Assessment: No/denies pain    Home Living                      Prior Function            PT Goals (current goals can now be found in the care plan section) Acute Rehab PT Goals Patient Stated Goal: to get comfortable Progress towards PT goals: Progressing toward goals    Frequency    Min 2X/week      PT Plan Current plan remains appropriate    Co-evaluation  AM-PAC PT "6 Clicks" Daily Activity  Outcome Measure  Difficulty turning over in bed (including adjusting bedclothes, sheets and blankets)?: Unable Difficulty moving from lying on back to sitting on the side of the bed? : Unable Difficulty sitting down on and standing up from a chair with arms (e.g., wheelchair, bedside commode, etc,.)?: Unable Help needed moving to and from a bed to chair (including a wheelchair)?: Total Help needed walking in hospital room?: Total Help needed climbing 3-5 steps with a railing? : Total 6 Click Score: 6    End of Session Equipment Utilized During Treatment: Gait belt Activity Tolerance: Patient tolerated treatment well Patient left: in chair Nurse Communication: Mobility status;Need for lift equipment PT Visit Diagnosis: Other abnormalities of gait and mobility (R26.89);Muscle weakness (generalized) (M62.81);Repeated falls (R29.6);History of falling  (Z91.81);Difficulty in walking, not elsewhere classified (R26.2);Other symptoms and signs involving the nervous system (R29.898)     Time: 1245-8099 PT Time Calculation (min) (ACUTE ONLY): 35 min  Charges:  $Therapeutic Activity: 23-37 mins                    G Codes:       Kittie Plater, PT, DPT Pager #: [phone] Office #: [phone]    Catherene Kaleta M Janetta Vandoren 08/22/2017, 2:40 PM

## 2017-08-22 NOTE — Progress Notes (Addendum)
PROGRESS NOTE    Travis Ross  PFX:[id number] DOB: [birth year] DOA: 08/15/2017 PCP: [name], MD   Brief Narrative:  84 y.o. male with Travis Ross Past Medical History of afib; myasthenia gravis; hypothyroidism; HLD; hand paresthesias related to C-spine disease; glaucoma; HTN; and COPD who presents with SOB and recurrent respiratory illness since December.  He had lower extremity edema on exam at presentation concerning for HF exacerbation and had CT chest with findings c/w pneumonia.   He's been treated for pneumonia and CHF exacerbation.  He's declined speech evaluations on 2 occasions, now palliative assisting and he's willing to undergo MBS.  Plan for MBS and then adjust diet as needed.  Likely d/c within next 24-48 hours pending MBS, respiratory status.  Assessment & Plan:   Principal Problem:   Acute respiratory failure with hypoxia (HCC) Active Problems:   Hyperlipidemia   COPD with chronic bronchitis and emphysema (HCC)   GERD (gastroesophageal reflux disease)   Hypothyroidism (acquired)   History of Cervical spinal cord compression (HCC)   Myasthenia gravis (HCC)   Acute on chronic systolic heart failure (HCC)   Acute hypokalemia   Decubitus ulcer of sacral region, stage 1  Goals of Care:  Appreciate assistance of palliative care.  Family meeting occurred today (6/25) with plan to have modified barium swallow to eval if pt may be aspirating.  Acute respiratory failure with hypoxia  -Patient presents with acute hypoxemic respiratory failure with recurrent episodes of dyspnea and shortness of breath previously treated as community-acquired pneumonia in the outpatient setting (6x since Oct 2018) - Possibly multifactorial with pneumonia on CT, but notable LE edema on exam which could be c/w HF exacerbation -Continue supportive care with oxygen and wean as tolerated noting was on RA prior to current symptoms - concern for worsening symptoms this morning and crackles on exam by resp  this AM (6/25), follow CXR and BNP - Given COPD, reasonable goal for O2 sats would be >88%   Pneumonia: pt with LLL and RLL opacities as well as RUL nodular opacity on imaging.   -Obtain ESR (elevated), respiratory procalcitonin (normal), blood cultures (NGTD), urinary strep (negative), sputum culture (pending collection) - MRSA PCR negative - CT chest recommending f/u 8-12 weeks  - Continue cefepime (7 days - 6/25 should be last day), stop vancomycin - On discussion with daughters, pt has had recurrent bouts of pneumonia every few weeks since December.  They estimate 5-6 episodes treated recurrently with levaquin.  Given his recurrent pneumonia and  CT findings here, will ask pulmonology to see him as well (had appt with Dr. Melvyn Novas in early July) - Pulm c/s (note 6/23), recommending dysphagia/aspiration w/u.  Follow up with pulmonology as outpatient.  Noted he would be high risk for bronchoscopy. - coughed up "dark red substance" last night, continue to monitor (stable H/H) - Pt had been refusing speech eval previously.  At this point in time, after discussion with palliative care, willing to undergo eval, will order MBS - palliative care following, appreciate recs  Dysphagia - pt declined speech evaluation again, but after discussion with palliative care, will get MBS   Acute on chronic systolic heart failure  - Elevated BNP, lower extremity edema - initially on lasix 60 IV BID -> switch to PO 40  - Repeat echo 6/19 with EF 40-45%, unable to eval LV diastolic function, mod increased PASP (see report) - improved from 01/2017 where EF was 30-35% -Continue beta blocker, losartan -Daily weights, strict I's/O  -Hypoalbuminemia.  Follow  UA (neg for protein).   -Patient's peak weight during hospitalization in December 2018 was 210 lbs and weight at admission 182 lbs (current weight 207, weights seem to be inaccurate - net negative) -Patient dry weight unknown and patient reports at facility is  given Travis Ross diet high in sugar and salt - Volume status appears stable, but with concerns for crackles this AM by RT, will follow BNP, CXR. give dose of IV lasix x1.    Wt Readings from Last 3 Encounters:  08/19/17 85.3 kg (188 lb)  02/08/17 94.3 kg (207 lb 14.3 oz)  12/15/16 86.2 kg (190 lb)    COPD with chronic bronchitis and emphysema  -Pt with transmitted upper airway sounds, don't think this is c/w COPD exacerbation -Abx for pneumonia as noted above -scheduled nebs, xopenex prn -Continue Dulera and Combivent for now  -Continue chronic prednisone     Myasthenia gravis  - He notes this has been stable with no exacerbation in about 2 years - Continue Imuran - avoid meds that could exacerbate MG    Acute hypokalemia  Hypomag - continue PO mag - follow K    Chronic atrial fibrillation with RVR -with intermittent RVR, metoprolol 100 mg daily (RVR improved) -eliquis    Decubitus ulcer of sacral region, stage 1 -Air mattress -Wound care consult -Albumin low-nutritional consultation  Left Tibial Wound: - occurred after skin procedure Halim Surrette couple months ago - wound care per wound care c/s, appreciate recs - outpatient follow up    Hyperlipidemia -Continue Lipitor    GERD (gastroesophageal reflux disease) -Continue Protonix    Hypothyroidism (acquired) -Continue Synthroid -TSH (wnl)    History of Cervical spinal cord compression  -Patient has chronic paresthesias (family notes at least for 1-2 years) making it difficult to perform any ADLs including something as simple as holding urinal -Condom catheter   Anemia: chronic, follow  Thrombocytopenia: improved  Leukocytosis: 2/2 above, follow  DVT prophylaxis: eliquis Code Status: DNR Family Communication: discussed with daughter Travis Ross over phone on 6/22.  Discussed with Travis Ross and Travis Ross today at bedside on 6/23.  Discussed with Travis Ross on phone 6/24 and 6/25. Disposition Plan: pending improvement   Consultants:    none  Procedures:   none  Antimicrobials: Anti-infectives (From admission, onward)   Start     Dose/Rate Route Frequency Ordered Stop   08/16/17 1800  azithromycin (ZITHROMAX) 500 mg in sodium chloride 0.9 % 250 mL IVPB  Status:  Discontinued     500 mg 250 mL/hr over 60 Minutes Intravenous Every 24 hours 08/15/17 1700 08/15/17 1732   08/16/17 0630  vancomycin (VANCOCIN) IVPB 1000 mg/200 mL premix  Status:  Discontinued     1,000 mg 200 mL/hr over 60 Minutes Intravenous Every 12 hours 08/15/17 1750 08/17/17 0759   08/16/17 0000  cefTRIAXone (ROCEPHIN) 1 g in sodium chloride 0.9 % 100 mL IVPB  Status:  Discontinued     1 g 200 mL/hr over 30 Minutes Intravenous Every 24 hours 08/15/17 1700 08/15/17 1732   08/15/17 1900  ceFEPIme (MAXIPIME) 2 g in sodium chloride 0.9 % 100 mL IVPB     2 g 200 mL/hr over 30 Minutes Intravenous Every 12 hours 08/15/17 1750 08/22/17 0647   08/15/17 1830  vancomycin (VANCOCIN) 1,500 mg in sodium chloride 0.9 % 500 mL IVPB     1,500 mg 250 mL/hr over 120 Minutes Intravenous  Once 08/15/17 1750 08/16/17 0909   08/15/17 1545  cefTRIAXone (ROCEPHIN) 1 g in sodium chloride 0.9 %  100 mL IVPB     1 g 200 mL/hr over 30 Minutes Intravenous  Once 08/15/17 1537 08/15/17 1638   08/15/17 1545  azithromycin (ZITHROMAX) 500 mg in sodium chloride 0.9 % 250 mL IVPB     500 mg 250 mL/hr over 60 Minutes Intravenous  Once 08/15/17 1537 08/15/17 1911     Subjective: Coughed up something dark red last night. Feeling [place] bit better.  Objective: Vitals:   08/21/17 2002 08/22/17 0109 08/22/17 0803 08/22/17 0806  BP:  (!) 151/87    Pulse:  83    Resp:      Temp:  98.5 F (36.9 C)    TempSrc:  Oral    SpO2: 98% 94% (!) 89% 96%  Weight:      Height:        Intake/Output Summary (Last 24 hours) at 08/22/2017 1701 Last data filed at 08/22/2017 0900 Gross per 24 hour  Intake 480 ml  Output 1300 ml  Net -820 ml   Filed Weights   08/17/17 0600 08/18/17 0500  08/19/17 0500  Weight: 93.9 kg (207 lb) 85.3 kg (188 lb) 85.3 kg (188 lb)    Examination:  General: No acute distress. Cardiovascular: Heart sounds show Micky Sheller regular rate, and rhythm.  Lungs: transmitted upper airway sounds Abdomen: Soft, nontender, nondistended with normal active bowel sounds. No masses. No hepatosplenomegaly. Neurological: Alert and oriented 3. Moves all extremities 4. Cranial nerves II through XII grossly intact. Skin: Warm and dry. No rashes or lesions. Extremities: No clubbing or cyanosis. Trace edema  Psychiatric: Mood and affect are normal. Insight and judgment are appropriate.  Data Reviewed: I have personally reviewed following labs and imaging studies  CBC: Recent Labs  Lab 08/18/17 0232 08/19/17 0410 08/20/17 0453 08/21/17 0343 08/22/17 0426  WBC 9.4 9.8 9.8 10.1 12.6*  HGB 9.7* 10.0* 10.3* 10.3* 11.2*  HCT 32.7* 33.9* 34.8* 34.6* 37.9*  MCV 88.1 89.2 89.0 88.0 89.0  PLT 162 171 196 216 539   Basic Metabolic Panel: Recent Labs  Lab 08/18/17 0232 08/19/17 0410 08/20/17 0453 08/21/17 0343 08/22/17 0426  NA 140 141 139 140 139  K 3.5 4.1 3.9 3.8 3.8  CL 99* 101 99* 99* 95*  CO2 33* 33* 32 33* 32  GLUCOSE 97 104* 100* 94 104*  BUN 22* '19 19 16 15  '$ CREATININE 0.76 0.77 0.80 0.81 0.82  CALCIUM 8.3* 8.6* 8.6* 8.6* 8.9  MG 1.7 1.9 1.8 1.9 1.8   GFR: Estimated Creatinine Clearance: 70.4 mL/min (by C-G formula based on SCr of 0.82 mg/dL). Liver Function Tests: Recent Labs  Lab 08/17/17 0350  AST 25  ALT 15*  ALKPHOS 112  BILITOT 1.0  PROT 5.8*  ALBUMIN 2.7*   No results for input(s): LIPASE, AMYLASE in the last 168 hours. No results for input(s): AMMONIA in the last 168 hours. Coagulation Profile: No results for input(s): INR, PROTIME in the last 168 hours. Cardiac Enzymes: No results for input(s): CKTOTAL, CKMB, CKMBINDEX, TROPONINI in the last 168 hours. BNP (last 3 results) No results for input(s): PROBNP in the last 8760  hours. HbA1C: No results for input(s): HGBA1C in the last 72 hours. CBG: Recent Labs  Lab 08/15/17 2120 08/16/17 0752 08/16/17 1139 08/17/17 1629 08/22/17 1211  GLUCAP 212* 121* 113* 106* 117*   Lipid Profile: No results for input(s): CHOL, HDL, LDLCALC, TRIG, CHOLHDL, LDLDIRECT in the last 72 hours. Thyroid Function Tests: No results for input(s): TSH, T4TOTAL, FREET4, T3FREE, THYROIDAB in the last  72 hours. Anemia Panel: No results for input(s): VITAMINB12, FOLATE, FERRITIN, TIBC, IRON, RETICCTPCT in the last 72 hours. Sepsis Labs: No results for input(s): PROCALCITON, LATICACIDVEN in the last 168 hours.  Recent Results (from the past 240 hour(s))  Culture, blood (routine x 2) Call MD if unable to obtain prior to antibiotics being given     Status: None   Collection Time: 08/15/17  4:18 PM  Result Value Ref Range Status   Specimen Description BLOOD RIGHT HAND  Final   Special Requests   Final    BOTTLES DRAWN AEROBIC AND ANAEROBIC Blood Culture adequate volume   Culture   Final    NO GROWTH 5 DAYS Performed at [facility], [address] [place], [place] [address]    Report Status 08/20/2017 FINAL  Final  Culture, blood (Routine X 2) w Reflex to ID Panel     Status: None   Collection Time: 08/16/17  5:10 AM  Result Value Ref Range Status   Specimen Description BLOOD RIGHT HAND  Final   Special Requests   Final    BOTTLES DRAWN AEROBIC AND ANAEROBIC Blood Culture adequate volume   Culture   Final    NO GROWTH 5 DAYS Performed at [facility], [place] [address], [place], [place] [address]    Report Status 08/21/2017 FINAL  Final  MRSA PCR Screening     Status: None   Collection Time: 08/16/17  6:41 PM  Result Value Ref Range Status   MRSA by PCR NEGATIVE NEGATIVE Final    Comment:        The GeneXpert MRSA Assay (FDA approved for NASAL specimens only), is one component of [place] comprehensive MRSA colonization surveillance program. It is not intended to  diagnose MRSA infection nor to guide or monitor treatment for MRSA infections. Performed at [facility], [place] [address], [place],  [address]          Radiology Studies: No results found.      Scheduled Meds: . apixaban  5 mg Oral BID  . aspirin EC  81 mg Oral Daily  . atorvastatin  10 mg Oral QPM  . azaTHIOprine  50 mg Oral BID  . docusate sodium  100 mg Oral BID  . fluticasone  1 spray Each Nare Daily  . furosemide  40 mg Oral Daily  . ipratropium-albuterol  3 mL Nebulization BID  . latanoprost  1 drop Both Eyes QHS  . levothyroxine  200 mcg Oral QAC breakfast  . losartan  12.5 mg Oral Daily  . magnesium oxide  400 mg Oral BID  . metoprolol succinate  100 mg Oral Daily  . mometasone-formoterol  2 puff Inhalation BID  . oxyCODONE  30 mg Oral Q12H  . pantoprazole  40 mg Oral BID  . predniSONE  5 mg Oral Q breakfast  . psyllium  1 packet Oral QHS  . senna  1 tablet Oral QPM  . sertraline  150 mg Oral Daily  . sodium chloride flush  3 mL Intravenous Q12H  . temazepam  30 mg Oral QHS   Continuous Infusions:    LOS: 7 days    Time spent: over 30 min    Fayrene Helper, MD [facility] Pager [phone]  If 7PM-7AM, please contact night-coverage [email or web address] Password [facility] 08/22/2017, 5:01 PM

## 2017-08-22 NOTE — Care Management Note (Signed)
Case Management Note  Patient Details  Name: Jarmaine Ehrler MRN: [id number] Date of Birth: 1926-07-31  Subjective/Objective:   From  [facility], he has been denied SNF by his insurance company so he will be returning to his ALF with resumption of HHRN, HHPT, HHOT, HHAIDE with [facility] at home.  Tiffany with [facility] notified.  Soc will begin 24-48hrs post dc.                 Action/Plan: DC home with [facility] services when ready.   Expected Discharge Date:                  Expected Discharge Plan:  Assisted Living / Rest Home  In-House Referral:  Clinical Social Work  Discharge planning Services  CM Consult  Post Acute Care Choice:  Resumption of Svcs/PTA Provider Choice offered to:     DME Arranged:    DME Agency:     HH Arranged:  RN, PT, OT, Nurse's Aide [place] Agency:  [facility] [employer] [facility] [employer]  Status of Service:  Completed, signed off  If discussed at [employer], dates discussed:    Additional Comments:  Zenon Mayo, RN 08/22/2017, 2:09 PM

## 2017-08-22 NOTE — Progress Notes (Addendum)
RT came to do neb tx, MDI. RT found pt on RA sats 88-89%. RT placed pt on 2L [place] sats 96%. RN notified

## 2017-08-23 ENCOUNTER — Inpatient Hospital Stay (HOSPITAL_COMMUNITY): Payer: Medicare HMO

## 2017-08-23 DIAGNOSIS — L89151 Pressure ulcer of sacral region, stage 1: Secondary | ICD-10-CM

## 2017-08-23 DIAGNOSIS — R0902 Hypoxemia: Secondary | ICD-10-CM

## 2017-08-23 DIAGNOSIS — I5023 Acute on chronic systolic (congestive) heart failure: Secondary | ICD-10-CM

## 2017-08-23 DIAGNOSIS — G7000 Myasthenia gravis without (acute) exacerbation: Secondary | ICD-10-CM

## 2017-08-23 DIAGNOSIS — K219 Gastro-esophageal reflux disease without esophagitis: Secondary | ICD-10-CM

## 2017-08-23 DIAGNOSIS — E039 Hypothyroidism, unspecified: Secondary | ICD-10-CM

## 2017-08-23 DIAGNOSIS — J449 Chronic obstructive pulmonary disease, unspecified: Secondary | ICD-10-CM

## 2017-08-23 DIAGNOSIS — T17900A Unspecified foreign body in respiratory tract, part unspecified causing asphyxiation, initial encounter: Secondary | ICD-10-CM

## 2017-08-23 DIAGNOSIS — E785 Hyperlipidemia, unspecified: Secondary | ICD-10-CM

## 2017-08-23 DIAGNOSIS — J9601 Acute respiratory failure with hypoxia: Secondary | ICD-10-CM

## 2017-08-23 LAB — CBC
HEMATOCRIT: 36.2 % — AB (ref 39.0–52.0)
HEMOGLOBIN: 10.6 g/dL — AB (ref 13.0–17.0)
MCH: 26.5 pg (ref 26.0–34.0)
MCHC: 29.3 g/dL — ABNORMAL LOW (ref 30.0–36.0)
MCV: 90.5 fL (ref 78.0–100.0)
Platelets: 208 10*3/uL (ref 150–400)
RBC: 4 MIL/uL — ABNORMAL LOW (ref 4.22–5.81)
RDW: 16.3 % — ABNORMAL HIGH (ref 11.5–15.5)
WBC: 10.8 10*3/uL — ABNORMAL HIGH (ref 4.0–10.5)

## 2017-08-23 LAB — BASIC METABOLIC PANEL
ANION GAP: 8 (ref 5–15)
BUN: 18 mg/dL (ref 8–23)
CHLORIDE: 95 mmol/L — AB (ref 98–111)
CO2: 36 mmol/L — AB (ref 22–32)
Calcium: 8.9 mg/dL (ref 8.9–10.3)
Creatinine, Ser: 1 mg/dL (ref 0.61–1.24)
GFR calc non Af Amer: >60 mL/min (ref >60)
GLUCOSE: 105 mg/dL — AB (ref 70–99)
Potassium: 4.2 mmol/L (ref 3.5–5.1)
Sodium: 139 mmol/L (ref 135–145)

## 2017-08-23 LAB — MAGNESIUM: Magnesium: 1.9 mg/dL (ref 1.7–2.4)

## 2017-08-23 NOTE — Progress Notes (Signed)
Nutrition Follow Up  DOCUMENTATION CODES:   Not applicable  INTERVENTION:    Dysphagia 2-thin liquid diet per SLP recommendations  NUTRITION DIAGNOSIS:   Increased nutrient needs related to chronic illness, wound healing as evidenced by estimated needs, ongoing  GOAL:   Patient will meet greater than or equal to 90% of their needs, progressing  MONITOR:   PO intake, Labs, Skin, Weight trends, I & O's  ASSESSMENT:   84 y.o. Male with PMH of afib; myasthenia gravis; hypothyroidism; HLD; hand paresthesias related to C-spine disease; glaucoma; HTN; and COPD who presents with SOB and recurrent respiratory illness since December.  Pt s/p MBSS today. SLP rec Dys 2-thin liquid diet. PO intake variable at 30-100% per flowsheet records. Palliative Medicine Team notes reviewed. Labs and medications reviewed. CBG 117.  Plan is for palliative outpatient with transition to hospice when he is ready.  Diet Order:   Diet Order           DIET DYS 2 Room service appropriate? Yes; Fluid consistency: Thin  Diet effective now         EDUCATION NEEDS:   No education needs have been identified at this time  Skin:  Skin Assessment: Skin Integrity Issues: Skin Integrity Issues:: Other (Comment) Other: MASD to sacral area; chronic full thickness wound to L leg  Last BM:  6/25  Height:   Ht Readings from Last 1 Encounters:  08/15/17 6\' 1"  (1.854 m)   Weight:   Wt Readings from Last 1 Encounters:  08/23/17 186 lb (84.4 kg)   BMI:  Body mass index is 24.54 kg/m.  Estimated Nutritional Needs:   Kcal:  1800-1950  Protein:  90-105 gm  Fluid:  1.8-1.9 L  Arthur Holms, RD, LDN Pager #: [phone] After-Hours Pager #: [phone]

## 2017-08-23 NOTE — Consult Note (Signed)
            [facility] CM Primary Care Navigator  08/23/2017  Travis Ross 10-20-1926 [id number]   Went to see patient at the bedsideto identify possible discharge needs. Patient reports that he resides at [facility] facility. Per MD note, patient presented with shortness of breath and recurrent respiratory illness since December that had led to this admission.  Patient reports thatDr.Keung Truman Hayward with [facility] was hisprimary care provider, but he has not seen him since he moved to the assisted living facility.Patient mentioned that he is being seen in the facility by physician from [facility] , (Dr. [name]) whom he confirms as his current primary care provider and will be following him up upon discharge from the hospital.  Anticipated discharge plan isstill to be determined pending improvement per MD note. Palliative Care consulted and plan is for palliative outpatient with transition to hospice when he is ready.  No current health management needs noted at this point.   For additional questions please contact:  Edwena Felty A. Jasdeep Kepner, BSN, RN-BC [facility] PRIMARY CARE Navigator Cell: [phone]

## 2017-08-23 NOTE — Consult Note (Signed)
Modified Barium Swallow Progress Note  Patient Details  Name: Travis Ross MRN: [id number] Date of Birth: [birth year]  Today's Date: 08/23/2017  Modified Barium Swallow completed.  Full report located under Chart Review in the Imaging Section.  Brief recommendations include the following:  Clinical Impression:  Pt exhibits moderate oropharyngeal dysphagia characterized by oral phase premature spillage into vallecular space and trace silent aspiration with thin via tsp/cup and nectar via cup with pt requiring cues to expectorate aspirate from airway; chin tuck and small sips assisted with eliminating aspiration during swallow d/t various contributing factors including decreased epiglottic inversion, reduced airway closure/reduced laryngeal elevation/mobility, Vallecular/pyriform residue noted with all consistencies ranging from mild-moderate with swallowing strategies utilized including repetitive swallows, throat clear/cough and re-swallow and chin tuck with clearance reduced to mild within vallecular space after strategies used, but not eliminating residue.  More residue noted with nectar-thick liquids than thin generating recommendation of thin liquids reducing aspiration risk with use of swallowing strategies; pt continues to be a mild-mod aspiration risk with use of strategies d/t amount of residue within vallecular space/fatigue with meals, so Dysphagia 2/thin via small sips with chin tuck and above strategies recommended; may consider palliative consult d/t risk for aspiration and fair prognosis.       Swallow Evaluation Recommendations       SLP Diet Recommendations: Dysphagia 2 (Fine chop) solids;Thin liquid;Other (Comment)(utilizing swallowing strategies); may consider palliative consult d/t fair prognosis/moderate risk for aspiration   Liquid Administration via: Cup;Other (Comment)(small sips)   Medication Administration: Crushed with puree   Supervision: Patient able to self  feed;Staff to assist with self feeding   Compensations: Slow rate;Small sips/bites;Multiple dry swallows after each bite/sip;Clear throat after each swallow;Effortful swallow;Chin tuck   Postural Changes: Seated upright at 90 degrees   Oral Care Recommendations: Oral care BID        Elvina Sidle, M.S., CCC-SLP 08/23/2017,11:55 AM

## 2017-08-23 NOTE — Progress Notes (Signed)
PROGRESS NOTE  Travis Ross FOY:[id number] DOB: Oct 28, 1926 DOA: 08/15/2017 PCP: [name], MD  HPI  Travis Ross is a 84 y.o. year old male with medical history significant for myasthenia gravis  (last criss many years ago), hypothyroidism, HLD, CHF, COPD and recurrent pneumonias over the past 6 months who presented on 08/15/2017 with SOB and was found to have acute on chronic respiratory failure of multifactorial etiology presumed related to aspiration pneumonia, COPD, and CHF exacerbation.  Interval History    ROS:    Subjective Feeling well  Assessment/Plan: Principal Problem:   Acute respiratory failure with hypoxia (HCC) Active Problems:   Hyperlipidemia   COPD with chronic bronchitis and emphysema (HCC)   GERD (gastroesophageal reflux disease)   Hypothyroidism (acquired)   History of Cervical spinal cord compression (HCC)   Myasthenia gravis (HCC)   Acute on chronic systolic heart failure (HCC)   Acute hypokalemia   Decubitus ulcer of sacral region, stage 1  Acute on chronic respiratory failure, stable. Multifactorial etiology. MBS consistent with silent aspiration for which speech recommends dysphagia 2 diet. Hopeful this change in diet can prevent this cycle of constant pneumonias. Palliative consulted and patient DNR  Multifocal PNA. Demonsrated LLL and RLL and RUL pacities on CT imaging. Completed IV IV cefepime. Briefly on vancomycin but no MRSA on blood cultures. This is 6th episode of pna ( previously treated with levaquin). Pulmonology recommended speech evaluation to work up dysphagia. Patient's neuromuscular disease and poor functional capicay would be high risk for bronchoscopy and necessary intubation. Additionally, patient and daughter agreed if it was a malignancy he would not want chemotherapy, radiation or surgery. Will follow up as outpatient on 07/2  Acute on chronic systolic CHF. Slightly improved EF from prior TTE. Volume overload improved with IV  lasix during stay. Net negative 8 L Continue betablocker and losartan. Given reduced EF will continue oral lasix regimen. Weights seem inaccurate  Dysphagia with silent aspiration. MBS confirms. Dysphagia 2 diet  Hypothyroidism, stable. TSH wnl. Continue synthroid 200 mcg  COPD, stable. Duo-nebsXopenex and dulera scheduled. Continue chronic prednisone 5 mg  Atrial Fibrillation, rate control. Continue eliquis and toprol.   Myasthenia gravis, stable. Though I wonder how much his aspiration can be attributed to his MG. Continue imuran   Depression/Anxiety, stable, continue zoloft and restoril  Stage 1 decubitus ulcer of sacrum, POA. Stable air mattress  HLD, stable. lipitor  GERD, stable, protonix  History of cervical spinal cord compression. Chronic paresthesias makes ADLs difficult.   Code Status: DNR   Family Communication: Spoke with daughter [name] on phone   Disposition Plan: plan to dc back to ALF on tomorrow, 6/27    Consultants:  Palliative, Pulm   Procedures:  TTE- 6/19 Left ventricle: The cavity size was normal. Wall thickness was   increased in a pattern of mild LVH. Systolic function was mildly   to moderately reduced. The estimated ejection fraction was in the   range of 40% to 45%. The study is not technically sufficient to   allow evaluation of LV diastolic function. - Mitral valve: Mildly calcified annulus. Mildly thickened, mildly   calcified leaflets . There was mild regurgitation. - Left atrium: The atrium was severely dilated. - Right atrium: The atrium was moderately to severely dilated. - Tricuspid valve: There was moderate regurgitation. - Pulmonary arteries: Systolic pressure was moderately increased.   PA peak pressure: 54 mm Hg (S).    Carotid Ultrasound- 6/25  Antimicrobials: Anti-infectives (From admission, onward)  Start     Dose/Rate Route Frequency Ordered Stop   08/16/17 1800  azithromycin (ZITHROMAX) 500 mg in sodium chloride 0.9 %  250 mL IVPB  Status:  Discontinued     500 mg 250 mL/hr over 60 Minutes Intravenous Every 24 hours 08/15/17 1700 08/15/17 1732   08/16/17 0630  vancomycin (VANCOCIN) IVPB 1000 mg/200 mL premix  Status:  Discontinued     1,000 mg 200 mL/hr over 60 Minutes Intravenous Every 12 hours 08/15/17 1750 08/17/17 0759   08/16/17 0000  cefTRIAXone (ROCEPHIN) 1 g in sodium chloride 0.9 % 100 mL IVPB  Status:  Discontinued     1 g 200 mL/hr over 30 Minutes Intravenous Every 24 hours 08/15/17 1700 08/15/17 1732   08/15/17 1900  ceFEPIme (MAXIPIME) 2 g in sodium chloride 0.9 % 100 mL IVPB     2 g 200 mL/hr over 30 Minutes Intravenous Every 12 hours 08/15/17 1750 08/22/17 0647   08/15/17 1830  vancomycin (VANCOCIN) 1,500 mg in sodium chloride 0.9 % 500 mL IVPB     1,500 mg 250 mL/hr over 120 Minutes Intravenous  Once 08/15/17 1750 08/16/17 0909   08/15/17 1545  cefTRIAXone (ROCEPHIN) 1 g in sodium chloride 0.9 % 100 mL IVPB     1 g 200 mL/hr over 30 Minutes Intravenous  Once 08/15/17 1537 08/15/17 1638   08/15/17 1545  azithromycin (ZITHROMAX) 500 mg in sodium chloride 0.9 % 250 mL IVPB     500 mg 250 mL/hr over 60 Minutes Intravenous  Once 08/15/17 1537 08/15/17 1911         Cultures:  Blood, 6/18: no growth Telemetry: yes  DVT prophylaxis: Apixaban   Objective: Vitals:   08/23/17 0833 08/23/17 1546 08/23/17 2020 08/23/17 2028  BP:  120/69    Pulse:  81    Resp:  16    Temp:  98.2 F (36.8 C)    TempSrc:  Oral    SpO2: 96% 97% 98% 98%  Weight:      Height:        Intake/Output Summary (Last 24 hours) at 08/23/2017 2238 Last data filed at 08/23/2017 1814 Gross per 24 hour  Intake 360 ml  Output 550 ml  Net -190 ml   Filed Weights   08/18/17 0500 08/19/17 0500 08/23/17 0500  Weight: 85.3 kg (188 lb) 85.3 kg (188 lb) 84.4 kg (186 lb)    Exam:  Constitutional:elderly male, no distress Eyes: EOMI, anicteric, normal conjunctivae ENMT: Oropharynx with moist mucous membranes,  poor dentition Cardiovascular: RRR no MRGs, with trace peripheral edema Respiratory: Normal respiratory effort on 3L [place],+ rhonchi no rales  Abdomen: Soft,non-tender,  Skin: No rash ulcers, or lesions. Without skin tenting  Neurologic: Grossly no focal neuro deficit. Psychiatric:Appropriate affect, and mood. Mental status alert, oriented to person, place, context, time  Data Reviewed: CBC: Recent Labs  Lab 08/19/17 0410 08/20/17 0453 08/21/17 0343 08/22/17 0426 08/23/17 0311  WBC 9.8 9.8 10.1 12.6* 10.8*  HGB 10.0* 10.3* 10.3* 11.2* 10.6*  HCT 33.9* 34.8* 34.6* 37.9* 36.2*  MCV 89.2 89.0 88.0 89.0 90.5  PLT 171 196 216 222 671   Basic Metabolic Panel: Recent Labs  Lab 08/19/17 0410 08/20/17 0453 08/21/17 0343 08/22/17 0426 08/23/17 0311  NA 141 139 140 139 139  K 4.1 3.9 3.8 3.8 4.2  CL 101 99* 99* 95* 95*  CO2 33* 32 33* 32 36*  GLUCOSE 104* 100* 94 104* 105*  BUN 19 19 16 15  18  CREATININE 0.77 0.80 0.81 0.82 1.00  CALCIUM 8.6* 8.6* 8.6* 8.9 8.9  MG 1.9 1.8 1.9 1.8 1.9   GFR: Estimated Creatinine Clearance: 57.7 mL/min (by C-G formula based on SCr of 1 mg/dL). Liver Function Tests: Recent Labs  Lab 08/17/17 0350  AST 25  ALT 15*  ALKPHOS 112  BILITOT 1.0  PROT 5.8*  ALBUMIN 2.7*   No results for input(s): LIPASE, AMYLASE in the last 168 hours. No results for input(s): AMMONIA in the last 168 hours. Coagulation Profile: No results for input(s): INR, PROTIME in the last 168 hours. Cardiac Enzymes: No results for input(s): CKTOTAL, CKMB, CKMBINDEX, TROPONINI in the last 168 hours. BNP (last 3 results) No results for input(s): PROBNP in the last 8760 hours. HbA1C: No results for input(s): HGBA1C in the last 72 hours. CBG: Recent Labs  Lab 08/17/17 1629 08/22/17 1211  GLUCAP 106* 117*   Lipid Profile: No results for input(s): CHOL, HDL, LDLCALC, TRIG, CHOLHDL, LDLDIRECT in the last 72 hours. Thyroid Function Tests: No results for input(s): TSH,  T4TOTAL, FREET4, T3FREE, THYROIDAB in the last 72 hours. Anemia Panel: No results for input(s): VITAMINB12, FOLATE, FERRITIN, TIBC, IRON, RETICCTPCT in the last 72 hours. Urine analysis:    Component Value Date/Time   COLORURINE YELLOW 08/16/2017 1038   APPEARANCEUR HAZY (A) 08/16/2017 1038   LABSPEC 1.010 08/16/2017 1038   PHURINE 5.0 08/16/2017 1038   GLUCOSEU NEGATIVE 08/16/2017 1038   HGBUR NEGATIVE 08/16/2017 1038   [place] 08/16/2017 1038   KETONESUR NEGATIVE 08/16/2017 1038   PROTEINUR NEGATIVE 08/16/2017 1038   NITRITE NEGATIVE 08/16/2017 1038   LEUKOCYTESUR NEGATIVE 08/16/2017 1038   Sepsis Labs: @LABRCNTIP (procalcitonin:4,lacticidven:4)  ) Recent Results (from the past 240 hour(s))  Culture, blood (routine x 2) Call MD if unable to obtain prior to antibiotics being given     Status: None   Collection Time: 08/15/17  4:18 PM  Result Value Ref Range Status   Specimen Description BLOOD RIGHT HAND  Final   Special Requests   Final    BOTTLES DRAWN AEROBIC AND ANAEROBIC Blood Culture adequate volume   Culture   Final    NO GROWTH 5 DAYS Performed at [facility], [place] [address] [place], [place] [address]    Report Status 08/20/2017 FINAL  Final  Culture, blood (Routine X 2) w Reflex to ID Panel     Status: None   Collection Time: 08/16/17  5:10 AM  Result Value Ref Range Status   Specimen Description BLOOD RIGHT HAND  Final   Special Requests   Final    BOTTLES DRAWN AEROBIC AND ANAEROBIC Blood Culture adequate volume   Culture   Final    NO GROWTH 5 DAYS Performed at [facility], [place] [address], [place], [place] [address]    Report Status 08/21/2017 FINAL  Final  MRSA PCR Screening     Status: None   Collection Time: 08/16/17  6:41 PM  Result Value Ref Range Status   MRSA by PCR NEGATIVE NEGATIVE Final    Comment:        The GeneXpert MRSA Assay (FDA approved for NASAL specimens only), is one component of a comprehensive MRSA  colonization surveillance program. It is not intended to diagnose MRSA infection nor to guide or monitor treatment for MRSA infections. Performed at [facility], [place] [address] [place], [place] [address]       Studies: Dg Swallowing Func-speech Pathology  Result Date: 08/23/2017 Objective  Swallowing Evaluation: Type of Study: MBS-Modified Barium Swallow Study  Patient Details Name: Philip Kotlyar MRN: [id number] Date of Birth: 10/30/1926 Today's Date: 08/23/2017 Time: SLP Start Time (ACUTE ONLY): 53 -SLP Stop Time (ACUTE ONLY): 1015 SLP Time Calculation (min) (ACUTE ONLY): 10 min Past Medical History: Past Medical History: Diagnosis Date . CAP (community acquired pneumonia) 02/04/2017 . COPD with chronic bronchitis and emphysema ([place])  . Essential hypertension  . Femur fracture, right ([place])   Related to a fall. Periprosthetic distal femur fracture (close to the right knee prosthesis).  > Initial plans were to treat with brace, however, has now progressed and may require surgery . GERD (gastroesophageal reflux disease)  . Glaucoma  . History of Cervical spinal cord compression w/ residual hand parestheisa  . Hyperlipidemia  . Hypothyroidism (acquired)   On Levothyroxine . Myasthenia gravis ([place])   Now (prior to recent femur fracture) was relegated to maneuvering himself around on a Rolling Walker (Rollator) using his feet & sitting on the walker.  Does not have arm strength to mover a wheelchair or transfer.;  . Osteoarthritis   Global osteoarthritis involving back, hips and knees as well as elbows. . Paresthesia   bilateral upper extremities, very weak ; poor grip  (must use adaptable silverware );  . Permanent atrial fibrillation (HCC)   Long-standing (> 4 yrs). Initially evaluated with Stress Test & Echo - Pt reports that these studies were "normal" Past Surgical History: Past Surgical History: Procedure Laterality Date . BACK SURGERY    x 6 . CARPAL TUNNEL RELEASE   . CHOLECYSTECTOMY   .  ELBOW SURGERY  08/2015  Total of 3 surgeries . REPLACEMENT TOTAL KNEE Bilateral   Right TKA 2003, left TKA 2010   . TOTAL KNEE REVISION Right 12/15/2016  Procedure: RIGHT TOTAL KNEE REVISION;  Surgeon: Gaynelle Arabian, MD;  Location: [facility];  Service: Orthopedics;  Laterality: Right; HPI: Pt is an 84 y.o. male with a PMH of afib; myasthenia gravis; hypothyroidism; HLD; hand paresthesias related to C-spine disease; glaucoma; HTN; and COPD who presents with SOB and recurrent respiratory illness since December. CT Chest showed LLL consolidation (which may be persistent or recurrent), RLL opacities, and RUL nodular opacity. BSE in December 2018 recommended regular diet and thin liquids. At that time pt shared that he had a h/o dysphagia after initial MG dx requiring feeding tube and then thickened liquids, but that he is able to eat/drink regular consistencies if he leans forward during eating/drinking to swallow more safely. This was observed to reduce coughing during this eval, which was suspected to be related to improved oral containment/timing of swallow.  Subjective: Pt stated he "coughs with thin mostly"; some nasal regurgitation/hx of MG Assessment / Plan / Recommendation CHL IP CLINICAL IMPRESSIONS 08/23/2017 Clinical Impression Pt exhibits moderate oropharyngeal dysphagia characterized by oral phase premature spillage into vallecular space and trace silent aspiration with thin via tsp/cup and nectar via cup with pt requiring cues to expectorate aspirate from airway; chin tuck and small sips assisted with eliminating aspiration during swallow d/t various contributing factors including decreased epiglottic inversion, reduced airway closure/reduced laryngeal elevation/mobility, Vallecular/pyriform residue noted with all consistencies ranging from mild-moderate with swallowing strategies utilized including repetitive swallows, throat clear/cough and re-swallow and chin tuck with clearance reduced to mild within  vallecular space after strategies used, but not eliminating residue.  More residue noted with nectar-thick liquids than thin generating recommendation of thin liquids reducing aspiration risk with use of swallowing strategies; pt continues to be  a mild-mod aspiration risk with use of strategies d/t amount of residue within vallecular space/fatigue with meals, so Dysphagia 2/thin via small sips with chin tuck and above strategies recommended; may consider palliative consult d/t risk for aspiration and fair prognosis.  SLP Visit Diagnosis Dysphagia, oropharyngeal phase (R13.12) Attention and concentration deficit following -- Frontal lobe and executive function deficit following -- Impact on safety and function Moderate aspiration risk   CHL IP TREATMENT RECOMMENDATION 08/23/2017 Treatment Recommendations Therapy as outlined in treatment plan below   Prognosis 08/23/2017 Prognosis for Safe Diet Advancement Fair Barriers to Reach Goals -- Barriers/Prognosis Comment -- CHL IP DIET RECOMMENDATION 08/23/2017 SLP Diet Recommendations Dysphagia 2 (Fine chop) solids;Thin liquid;Other (small sips/chin tuck) Liquid Administration via Cup;Other (small sips/chin tuck) Medication Administration Crushed with puree Compensations Slow rate;Small sips/bites;Multiple dry swallows after each bite/sip;Clear throat after each swallow;Effortful swallow;Chin tuck Postural Changes Seated upright at 90 degrees   CHL IP OTHER RECOMMENDATIONS 08/23/2017 Recommended Consults -- Oral Care Recommendations Oral care BID Other Recommendations --   CHL IP FOLLOW UP RECOMMENDATIONS 08/23/2017 Follow up Recommendations Home health SLP;Outpatient SLP for RMST/dysphagia management   CHL IP FREQUENCY AND DURATION 08/23/2017 Speech Therapy Frequency (ACUTE ONLY) min 2x/week Treatment Duration 1 week      CHL IP ORAL PHASE 08/23/2017 Oral Phase Impaired Oral - Pudding Teaspoon -- Oral - Pudding Cup -- Oral - Honey Teaspoon -- Oral - Honey Cup -- Oral - Nectar  Teaspoon -- Oral - Nectar Cup -- Oral - Nectar Straw -- Oral - Thin Teaspoon Premature spillage Oral - Thin Cup Premature spillage Oral - Thin Straw Other (Comment) Oral - Puree Premature spillage Oral - Mech Soft -- Oral - Regular -- Oral - Multi-Consistency -- Oral - Pill -- Oral Phase - Comment --  CHL IP PHARYNGEAL PHASE 08/23/2017 Pharyngeal Phase Impaired Pharyngeal- Pudding Teaspoon -- Pharyngeal -- Pharyngeal- Pudding Cup -- Pharyngeal -- Pharyngeal- Honey Teaspoon -- Pharyngeal -- Pharyngeal- Honey Cup -- Pharyngeal -- Pharyngeal- Nectar Teaspoon -- Pharyngeal -- Pharyngeal- Nectar Cup -- Pharyngeal -- Pharyngeal- Nectar Straw -- Pharyngeal -- Pharyngeal- Thin Teaspoon Delayed swallow initiation-vallecula;Reduced epiglottic inversion;Reduced laryngeal elevation;Reduced airway/laryngeal closure;Penetration/Aspiration during swallow;Trace aspiration;Pharyngeal residue - valleculae;Pharyngeal residue - pyriform;Compensatory strategies attempted (with notebox) Pharyngeal Material enters airway, passes BELOW cords without attempt by patient to eject out (silent aspiration);Other (Comment) Pharyngeal- Thin Cup Penetration/Aspiration during swallow;Reduced airway/laryngeal closure;Reduced laryngeal elevation;Reduced epiglottic inversion;Delayed swallow initiation-vallecula;Compensatory strategies attempted (with notebox);Pharyngeal residue - valleculae;Trace aspiration Pharyngeal Material enters airway, passes BELOW cords without attempt by patient to eject out (silent aspiration);Other (Comment) Pharyngeal- Thin Straw -- Pharyngeal -- Pharyngeal- Puree Delayed swallow initiation-vallecula;Pharyngeal residue - valleculae;Compensatory strategies attempted (with notebox) Pharyngeal -- Pharyngeal- Mechanical Soft Delayed swallow initiation-vallecula;Pharyngeal residue - valleculae;Compensatory strategies attempted (with notebox) Pharyngeal -- Pharyngeal- Regular -- Pharyngeal -- Pharyngeal- Multi-consistency --  Pharyngeal -- Pharyngeal- Pill -- Pharyngeal -- Pharyngeal Comment (No Data)  CHL IP CERVICAL ESOPHAGEAL PHASE 08/23/2017 Cervical Esophageal Phase WFL Pudding Teaspoon -- Pudding Cup -- Honey Teaspoon -- Honey Cup -- Nectar Teaspoon -- Nectar Cup -- Nectar Straw -- Thin Teaspoon -- Thin Cup -- Thin Straw -- Puree -- Mechanical Soft -- Regular -- Multi-consistency -- Pill -- Cervical Esophageal Comment -- No flowsheet data found. Elvina Sidle, M.S., CCC-SLP 08/23/2017, 11:36 AM               Scheduled Meds: . apixaban  5 mg Oral BID  . aspirin EC  81 mg Oral Daily  . atorvastatin  10 mg Oral QPM  .  azaTHIOprine  50 mg Oral BID  . docusate sodium  100 mg Oral BID  . fluticasone  1 spray Each Nare Daily  . furosemide  40 mg Oral Daily  . ipratropium-albuterol  3 mL Nebulization BID  . latanoprost  1 drop Both Eyes QHS  . levothyroxine  200 mcg Oral QAC breakfast  . losartan  12.5 mg Oral Daily  . magnesium oxide  400 mg Oral BID  . metoprolol succinate  100 mg Oral Daily  . mometasone-formoterol  2 puff Inhalation BID  . oxyCODONE  30 mg Oral Q12H  . pantoprazole  40 mg Oral BID  . predniSONE  5 mg Oral Q breakfast  . psyllium  1 packet Oral QHS  . senna  1 tablet Oral QPM  . sertraline  150 mg Oral Daily  . sodium chloride flush  3 mL Intravenous Q12H  . temazepam  30 mg Oral QHS    Continuous Infusions:   LOS: 8 days     Desiree Hane, MD [facility] Pager [phone]  If 7PM-7AM, please contact night-coverage [email or web address] Password [facility] 08/23/2017, 10:38 PM

## 2017-08-23 NOTE — Progress Notes (Signed)
Daily Progress Note   Patient Name: Travis Ross       Date: 08/23/2017 DOB: 1926-09-14  Age: 84 y.o. MRN#: [id number] Attending Physician: Desiree Hane, MD Primary Care Physician: [name], MD Admit Date: 08/15/2017  Reason for Consultation/Follow-up: Establishing goals of care  Subjective: Patient resting in bed. He states he is aware he will need to make modifications to his diet. He states "if that's what I have to do to live, that is what I will do." He states he wants to continue to treat the treatable and return to the hospital as needed. He is aware that the modifications will help, but will not completely insure that he will not aspirate. At his request, [name] was notified.  He states he would like [name] to be his medical POA as well as current financial POA. [name] states she has been updated previously by primary team.   Plan for palliative outpatient with transition to hospice when he is ready.    Length of Stay: 8  Current Medications: Scheduled Meds:  . apixaban  5 mg Oral BID  . aspirin EC  81 mg Oral Daily  . atorvastatin  10 mg Oral QPM  . azaTHIOprine  50 mg Oral BID  . docusate sodium  100 mg Oral BID  . fluticasone  1 spray Each Nare Daily  . furosemide  40 mg Oral Daily  . ipratropium-albuterol  3 mL Nebulization BID  . latanoprost  1 drop Both Eyes QHS  . levothyroxine  200 mcg Oral QAC breakfast  . losartan  12.5 mg Oral Daily  . magnesium oxide  400 mg Oral BID  . metoprolol succinate  100 mg Oral Daily  . mometasone-formoterol  2 puff Inhalation BID  . oxyCODONE  30 mg Oral Q12H  . pantoprazole  40 mg Oral BID  . predniSONE  5 mg Oral Q breakfast  . psyllium  1 packet Oral QHS  . senna  1 tablet Oral QPM  . sertraline  150 mg Oral Daily  . sodium  chloride flush  3 mL Intravenous Q12H  . temazepam  30 mg Oral QHS    Continuous Infusions:   PRN Meds: acetaminophen **OR** acetaminophen, albuterol, levalbuterol, ondansetron **OR** ondansetron (ZOFRAN) IV  Physical Exam  Constitutional: No distress.  Pulmonary/Chest: Breath sounds normal.  Neurological: He is alert.            Vital Signs: BP 113/66 (BP Location: Right Arm)   Pulse 65   Temp 97.6 F (36.4 C) (Oral)   Resp (!) 22   Ht 6\' 1"  (1.854 m)   Wt 84.4 kg (186 lb)   SpO2 96%   BMI 24.54 kg/m  SpO2: SpO2: 96 % O2 Device: O2 Device: Nasal Cannula O2 Flow Rate: O2 Flow Rate (L/min): 3 L/min  Intake/output summary:   Intake/Output Summary (Last 24 hours) at 08/23/2017 1526 Last data filed at 08/23/2017 0945 Gross per 24 hour  Intake 598 ml  Output 1901 ml  Net -1303 ml   LBM: Last BM Date: 08/22/17 Baseline Weight: Weight: 82.6 kg (182 lb) Most recent weight: Weight: 84.4 kg (186 lb)       Palliative Assessment/Data: 40%    Flowsheet Rows     Most Recent Value  Intake Tab  Referral Department  Hospitalist  Unit at Time of Referral  Med/Surg Unit  Palliative Care Primary Diagnosis  Cardiac  Date Notified  08/21/17  Palliative Care Type  New Palliative care  Reason for referral  Clarify Goals of Care  Date of Admission  08/15/17  Date first seen by Palliative Care  08/21/17  # of days Palliative referral response time  0 Day(s)  # of days IP prior to Palliative referral  6  Clinical Assessment  Psychosocial & Spiritual Assessment  Palliative Care Outcomes      Patient Active Problem List   Diagnosis Date Noted  . Acute respiratory failure with hypoxia ([place]) 08/15/2017  . Hyperlipidemia 08/15/2017  . COPD with chronic bronchitis and emphysema ([place]) 08/15/2017  . GERD (gastroesophageal reflux disease) 08/15/2017  . Hypothyroidism (acquired) 08/15/2017  . History of Cervical spinal cord compression ([facility]) 08/15/2017  . Myasthenia gravis ([place])  08/15/2017  . Acute on chronic systolic heart failure ([place]) 08/15/2017  . Acute hypokalemia 08/15/2017  . Decubitus ulcer of sacral region, stage 1 08/15/2017  . Pressure injury of sacral region, stage 2 02/06/2017  . Acute on chronic systolic (congestive) heart failure ([place]) 02/06/2017  . Community acquired pneumonia 02/04/2017  . A-fib ([place])   . Periprosthetic fracture around internal prosthetic right knee joint 12/15/2016  . Failed total knee arthroplasty ([place]) 12/15/2016  . Permanent atrial fibrillation ([place]) 12/05/2016  . Pre-operative cardiovascular examination 12/05/2016  . Venous stasis of both lower extremities 12/05/2016  . Essential hypertension     Palliative Care Assessment & Plan   Patient Profile: Emmanuelle Coxe a 84 y.o.malewith myasthenia gravis and cervical spine disease who has had recurrent pneumonias over the past 6 months that have been associated with CHF exacerbations.   Assessment/Recommendations/Plan: Aspiration present. Per SLP conversation, swallowing/ aspiration risk may improve with therapy and modifications to his diet.     Code Status:    Code Status Orders  (From admission, onward)        Start     Ordered   08/15/17 1630  Do not attempt resuscitation (DNR)  Continuous    Question Answer Comment  In the event of cardiac or respiratory ARREST Do not call a "code blue"   In the event of cardiac or respiratory ARREST Do not perform Intubation, CPR, defibrillation or ACLS   In the event of cardiac or respiratory ARREST Use medication by any route, position, wound care, and other measures to relive pain and suffering. May use oxygen, suction and manual treatment of airway  obstruction as needed for comfort.      08/15/17 1631    Code Status History    Date Active Date Inactive Code Status Order ID Comments User Context   02/04/2017 0930 02/09/2017 0035 Partial Code [id number]  Alphonzo Grieve, MD ED   12/15/2016 1842 12/22/2016 1658 Full  Code [id number]  Gaynelle Arabian, MD Inpatient    Advance Directive Documentation     Most Recent Value  Type of Advance Directive  Out of facility DNR (pink MOST or yellow form), Living will, Healthcare Power of Attorney  Pre-existing out of facility DNR order (yellow form or pink MOST form)  -  "MOST" Form in Place?  -       Prognosis:   Unable to determine  Discharge Planning:  To Be Determined  Care plan was discussed with SW  Thank you for allowing the Palliative Medicine Team to assist in the care of this patient.   Total Time 35 min Prolonged Time Billed  yes      Greater than 50%  of this time was spent counseling and coordinating care related to the above assessment and plan.  Asencion Gowda, NP  Please contact Palliative Medicine Team phone at [phone] for questions and concerns.

## 2017-08-24 ENCOUNTER — Inpatient Hospital Stay (HOSPITAL_COMMUNITY): Payer: Medicare HMO

## 2017-08-24 MED ORDER — ORAL CARE MOUTH RINSE: 15.0000 mL | Freq: Two times a day (BID) | OROMUCOSAL | Status: DC

## 2017-08-24 MED ORDER — FUROSEMIDE 10 MG/ML IJ SOLN
40.0000 mg | Freq: Once | INTRAMUSCULAR | Status: AC
  Administered 2017-08-24: 40 mg via INTRAVENOUS
  Filled 2017-08-24: qty 4

## 2017-08-24 MED ORDER — OXYCODONE HCL ER 10 MG PO T12A
10.0000 mg | EXTENDED_RELEASE_TABLET | Freq: Once | ORAL | Status: AC
  Administered 2017-08-24: 10 mg via ORAL
  Filled 2017-08-24: qty 1

## 2017-08-24 MED ORDER — FUROSEMIDE 40 MG PO TABS
40.0000 mg | ORAL_TABLET | Freq: Every day | ORAL | Status: DC
  Administered 2017-08-24 – 2017-08-25 (×2): 40 mg via ORAL
  Filled 2017-08-24 (×2): qty 1

## 2017-08-24 NOTE — Plan of Care (Signed)
  Problem: Health Behavior/Discharge Planning: Goal: Ability to manage health-related needs will improve Outcome: Not Progressing  RN reviewed POC with patient for the night. RN reminded pt that he needs to take his medication in puree. Pt stated he did not want any puree (applesauce or pudding) and would rather just take his medication whole. RN explained the need to take his meds with puree due to his risk of aspiration, however pt insisted on taking his meds without puree. Pt also wanted to split his meds and take half his pills at one time. RN encouraged pt to take his pills one at a time. Pt did have some coughing after swallowing his pills-one at a time. RN again educated pt on his risk of aspiration. Pt again insisted that he take half his pills at a time. Pt does not seem to understand the need to take his medications in puree and that he is silently aspirating when taking in fluids.

## 2017-08-24 NOTE — Discharge Summary (Signed)
Discharge Summary  Travis Ross HCW:[id number] DOB: 1926-03-23  PCP: [name], MD  Admit date: 08/15/2017 Discharge date: 08/25/17   Time spent: < 25 minutes  Admitted From:ALF Disposition:  ALF  Recommendations for Outpatient Follow-up:  1. Follow up with PCP in 1-2 weeks 2. New medications: Stopped plavix in favor of eliquis for anticoagulation for Afib. Toprol increased to 100 mg qd 3. Dysphagia 2 diet due to silent aspiration confirmed on modified barium swallow    Home Health:NO Equipment/Devices:none Discharge Diagnoses:  Active Hospital Problems   Diagnosis Date Noted  . Acute respiratory failure with hypoxia ([place]) 08/15/2017  . Hyperlipidemia 08/15/2017  . COPD with chronic bronchitis and emphysema ([place]) 08/15/2017  . GERD (gastroesophageal reflux disease) 08/15/2017  . Hypothyroidism (acquired) 08/15/2017  . History of Cervical spinal cord compression (HCC) 08/15/2017  . Myasthenia gravis ([place]) 08/15/2017  . Acute on chronic systolic heart failure ([place]) 08/15/2017  . Acute hypokalemia 08/15/2017  . Decubitus ulcer of sacral region, stage 1 08/15/2017    Resolved Hospital Problems  No resolved problems to display.    Discharge Condition: Stable  CODE STATUS:DNR Diet recommendation: Dysphagia 2 (fine chop)   Diet recommendations: Dysphagia 2 (fine chop);Thin liquid Liquids provided via: Cup;No straw Medication Administration: Crushed with puree Supervision: Staff to assist with self feeding;Full supervision/cueing for compensatory strategies Compensations: Slow rate;Small sips/bites;Multiple dry swallows after each bite/sip;Clear throat after each swallow;Effortful swallow;Chin tuck Postural Changes and/or Swallow Maneuvers: Seated upright 90 degrees;Upright 30-60 min after meal;Chin tuck                Oral Care Recommendations: Oral care QID(frequent oral care due to aspiration risk) Follow up Recommendations: Home health SLP SLP  Visit Diagnosis: Dysphagia, oropharyngeal phase (R13.12) Plan: Continue with current plan of care      Vitals:   08/25/17 0030 08/25/17 0726  BP: 113/67 109/68  Pulse: (!) 48 79  Resp:  18  Temp: 98.5 F (36.9 C) 98.5 F (36.9 C)  SpO2: 96% 92%    History of present illness:    Travis Ross is a 84 y.o. year old male with medical history significant for myasthenia gravis  (last criss many years ago), hypothyroidism, HLD, CHF, COPD and recurrent pneumonias over the past 6 months who presented on 08/15/2017 with SOB and was found to have acute on chronic respiratory failure of multifactorial etiology presumed related to aspiration pneumonia, COPD, and CHF exacerbation. Remaining hospital course addressed in problem based format below:   Hospital Course:  Principal Problem:   Acute respiratory failure with hypoxia (HCC) Active Problems:   Hyperlipidemia   COPD with chronic bronchitis and emphysema (HCC)   GERD (gastroesophageal reflux disease)   Hypothyroidism (acquired)   History of Cervical spinal cord compression (HCC)   Myasthenia gravis (HCC)   Acute on chronic systolic heart failure (HCC)   Acute hypokalemia   Decubitus ulcer of sacral region, stage 1  Acute respiratory failure, resolved. Multifactorial etiology.  Likely combined effect from CHF exacerbation, aspiration pneumonia, COPD exacerbation. Was able to wean off 3 L [place] to room air on day of discharge.  Palliative care discussion with patient and daughter during hospital stay by our palliative medicine team. Patient elects to be DNR. Would like to still receive full scope of treatment understanding he is at risk for recurrent admissions given his dysphagia. Supportive care to continue with incentive spirometry and flutter valve   Multifocal PNA, resolved. Demonsrated LLL and RLL and RUL pacities on CT imaging.  Completed 7 days IV cefepime. Briefly on vancomycin empirically on admission but no MRSA on blood  cultures. This is 6th episode of pna ( previously treated with levaquin as an outpatient). Modified barium swallow was consistent with silent aspiration doing well on dysphagia 2 diet.. Patient's neuromuscular disease and poor functional capicay would be high risk for bronchoscopy and necessary intubation per pulmonology recommendations. Additionally, patient and daughter agreed if it was a malignancy he would not want chemotherapy, radiation or surgery. Will follow up as outpatient on 07/2//19 with pulmonology  Acute on chronic systolic CHF, resolved. Slightly improved EF from prior TTE. Volume overload improved with IV lasix during stay. Net negative 10 L during admission. Continue betablocker, oral Lasix 40 mg qdand losartan.   Dysphagia with silent aspiration. MBS confirms. Dysphagia 2 diet  Hypothyroidism, stable. TSH wnl. Continue synthroid 200 mcg  COPD, stable.  Continue home schedule inhalers/nebulizer treatments. Continue chronic prednisone 5 mg  Atrial Fibrillation, rate controlled.  Toprol increased home dose to  100 mg qd for better rate control.  Home Plavix was discontinued in favor of Eliquis during admission (CHADSVASC: 4) and will continue on discharge.    Myasthenia gravis, stable. Unclear how much his aspiration can be attributed to his MG, no signs of crisis during stay Continue imuran   Depression/Anxiety, stable, continue zoloft and restoril  Stage 1 decubitus ulcer of sacrum, POA. Stable. air mattress  HLD, stable. lipitor  GERD, stable, protonix  History of cervical spinal cord compression. Chronic paresthesias makes ADLs difficult    Antibiotics: Anti-infectives (From admission, onward)   Start     Dose/Rate Route Frequency Ordered Stop   08/16/17 1800  azithromycin (ZITHROMAX) 500 mg in sodium chloride 0.9 % 250 mL IVPB  Status:  Discontinued     500 mg 250 mL/hr over 60 Minutes Intravenous Every 24 hours 08/15/17 1700 08/15/17 1732   08/16/17  0630  vancomycin (VANCOCIN) IVPB 1000 mg/200 mL premix  Status:  Discontinued     1,000 mg 200 mL/hr over 60 Minutes Intravenous Every 12 hours 08/15/17 1750 08/17/17 0759   08/16/17 0000  cefTRIAXone (ROCEPHIN) 1 g in sodium chloride 0.9 % 100 mL IVPB  Status:  Discontinued     1 g 200 mL/hr over 30 Minutes Intravenous Every 24 hours 08/15/17 1700 08/15/17 1732   08/15/17 1900  ceFEPIme (MAXIPIME) 2 g in sodium chloride 0.9 % 100 mL IVPB     2 g 200 mL/hr over 30 Minutes Intravenous Every 12 hours 08/15/17 1750 08/22/17 0647   08/15/17 1830  vancomycin (VANCOCIN) 1,500 mg in sodium chloride 0.9 % 500 mL IVPB     1,500 mg 250 mL/hr over 120 Minutes Intravenous  Once 08/15/17 1750 08/16/17 0909   08/15/17 1545  cefTRIAXone (ROCEPHIN) 1 g in sodium chloride 0.9 % 100 mL IVPB     1 g 200 mL/hr over 30 Minutes Intravenous  Once 08/15/17 1537 08/15/17 1638   08/15/17 1545  azithromycin (ZITHROMAX) 500 mg in sodium chloride 0.9 % 250 mL IVPB     500 mg 250 mL/hr over 60 Minutes Intravenous  Once 08/15/17 1537 08/15/17 1911       Microbiology:  Blood, 6/18: no growth  Strep pneumo urinary antigen negative     Consultations:  Palliative, Pulmonology   Procedures/Studies: TTE: 08/16/17:  - Left ventricle: The cavity size was normal. Wall thickness was   increased in a pattern of mild LVH. Systolic function was mildly   to moderately reduced.  The estimated ejection fraction was in the   range of 40% to 45%. The study is not technically sufficient to   allow evaluation of LV diastolic function. - Mitral valve: Mildly calcified annulus. Mildly thickened, mildly   calcified leaflets . There was mild regurgitation. - Left atrium: The atrium was severely dilated. - Right atrium: The atrium was moderately to severely dilated. - Tricuspid valve: There was moderate regurgitation. - Pulmonary arteries: Systolic pressure was moderately increased.   PA peak pressure: 54 mm Hg (S). Dg  Chest 2 View  Result Date: 08/22/2017 CLINICAL DATA:  Hypoxia, COPD and hypertension.  Nonsmoker. EXAM: CHEST - 2 VIEW COMPARISON:  08/15/2017 FINDINGS: Stable cardiomegaly. Mild aortic atherosclerosis. Central vascular congestion is noted with left basilar atelectasis and small posterior pleural effusion. No acute pulmonary consolidation. No pneumothorax. Trace fluid in the left major fissure. No acute osseous abnormality. IMPRESSION: Stable cardiomegaly with aortic atherosclerosis and central vascular congestion. Left basilar atelectasis with small left effusion persists without significant change. Electronically Signed   By: Ashley Royalty M.D.   On: 08/22/2017 20:34   Dg Chest 2 View  Result Date: 08/15/2017 CLINICAL DATA:  Short of breath, wheezing, cough for a week, history of recent pneumonia EXAM: CHEST - 2 VIEW COMPARISON:  CT chest of 02/04/2017 and chest x-ray of the same day FINDINGS: The lungs are not as well aerated but the right basilar pneumonia has cleared. However, as a result of the poor inspiration, is difficult to exclude pneumonia remaining at the left lung base in the right mid upper lung. A tiny effusion may be present blunting the costophrenic angles on the lateral view. Cardiomegaly is stable. No bony abnormality is seen. There are degenerative changes both shoulders. IMPRESSION: 1. Poor inspiration but clearing of the right basilar pneumonia. Otherwise patchy pneumonia in the right mid lung and left lung base cannot be excluded. 2. Stable cardiomegaly. Electronically Signed   By: Ivar Drape M.D.   On: 08/15/2017 13:40   Ct Chest Wo Contrast  Result Date: 08/15/2017 CLINICAL DATA:  84 year old male with productive cough for 2 days and shortness of breath for 4 months. EXAM: CT CHEST WITHOUT CONTRAST TECHNIQUE: Multidetector CT imaging of the chest was performed following the standard protocol without IV contrast. COMPARISON:  08/15/2017 and prior chest radiographs. 02/04/2017 chest  CT FINDINGS: Cardiovascular: Cardiomegaly again noted. Coronary artery and thoracic aortic atherosclerotic calcifications again identified. No thoracic aortic aneurysm or pericardial effusion. Mediastinum/Nodes: Unchanged mediastinal lymph nodes with index 11 mm RIGHT paratracheal node (series 3: Image 33) and 6 mm AP window node (3:54). No new or enlarging lymph nodes within the chest. No mediastinal mass. The visualized thyroid gland and esophagus are unremarkable. Lungs/Pleura: Consolidation of the LEFT LOWER lobe again noted which may be persistent or recurrent. Patchy ill-defined opacities within the lingula and RIGHT LOWER lobe noted. A new 1 x 2.3 cm slightly irregular RIGHT UPPER lobe nodular opacity (4:51) is identified. Very small bilateral pleural effusions are noted with mild bibasilar atelectasis. There is no evidence of pneumothorax. Upper Abdomen: No acute abnormality Musculoskeletal: No acute abnormality or suspicious bony lesion. Bridging syndesmophytes/DISH changes throughout the thoracic spine again noted. IMPRESSION: 1. LEFT LOWER lobe consolidation, scattered patchy lingular and RIGHT LOWER lobe opacities and new 1 x 2.3 cm RIGHT UPPER lobe nodular opacity. Given that some of these opacities are likely infectious/pneumonia, recommend CT follow-up in 8-12 weeks following appropriate antibiotic therapy. 2. Very small bilateral pleural effusions. 3. Cardiomegaly 4. Coronary  artery and Aortic Atherosclerosis (ICD10-I70.0). Electronically Signed   By: Margarette Canada M.D.   On: 08/15/2017 17:27   Dg Chest Port 1 View  Result Date: 08/24/2017 CLINICAL DATA:  Dyspnea EXAM: PORTABLE CHEST 1 VIEW COMPARISON:  08/22/2017 FINDINGS: Cardiac shadow remains enlarged. Aortic calcifications are again seen. Increasing left basilar infiltrate is noted with associated effusion. Right lung is clear with some patchy changes in the right mid lung and right base. These are slightly increased when compared with the  prior exam. No bony abnormality is seen. IMPRESSION: Increasing bilateral infiltrative changes. Electronically Signed   By: Inez Catalina M.D.   On: 08/24/2017 10:07   Dg Swallowing Func-speech Pathology  Result Date: 08/23/2017 Objective Swallowing Evaluation: Type of Study: MBS-Modified Barium Swallow Study  Patient Details Name: Travis Ross MRN: [id number] Date of Birth: Oct 13, 1926 Today's Date: 08/23/2017 Time: SLP Start Time (ACUTE ONLY): 41 -SLP Stop Time (ACUTE ONLY): 1015 SLP Time Calculation (min) (ACUTE ONLY): 10 min Past Medical History: Past Medical History: Diagnosis Date . CAP (community acquired pneumonia) 02/04/2017 . COPD with chronic bronchitis and emphysema ([place])  . Essential hypertension  . Femur fracture, right ([place])   Related to a fall. Periprosthetic distal femur fracture (close to the right knee prosthesis).  > Initial plans were to treat with brace, however, has now progressed and may require surgery . GERD (gastroesophageal reflux disease)  . Glaucoma  . History of Cervical spinal cord compression w/ residual hand parestheisa  . Hyperlipidemia  . Hypothyroidism (acquired)   On Levothyroxine . Myasthenia gravis ([place])   Now (prior to recent femur fracture) was relegated to maneuvering himself around on a Rolling Walker (Rollator) using his feet & sitting on the walker.  Does not have arm strength to mover a wheelchair or transfer.;  . Osteoarthritis   Global osteoarthritis involving back, hips and knees as well as elbows. . Paresthesia   bilateral upper extremities, very weak ; poor grip  (must use adaptable silverware );  . Permanent atrial fibrillation (HCC)   Long-standing (> 4 yrs). Initially evaluated with Stress Test & Echo - Pt reports that these studies were "normal" Past Surgical History: Past Surgical History: Procedure Laterality Date . BACK SURGERY    x 6 . CARPAL TUNNEL RELEASE   . CHOLECYSTECTOMY   . ELBOW SURGERY  08/2015  Total of 3 surgeries . REPLACEMENT TOTAL KNEE  Bilateral   Right TKA 2003, left TKA 2010   . TOTAL KNEE REVISION Right 12/15/2016  Procedure: RIGHT TOTAL KNEE REVISION;  Surgeon: Gaynelle Arabian, MD;  Location: [facility];  Service: Orthopedics;  Laterality: Right; HPI: Pt is an 84 y.o. male with a PMH of afib; myasthenia gravis; hypothyroidism; HLD; hand paresthesias related to C-spine disease; glaucoma; HTN; and COPD who presents with SOB and recurrent respiratory illness since December. CT Chest showed LLL consolidation (which may be persistent or recurrent), RLL opacities, and RUL nodular opacity. BSE in December 2018 recommended regular diet and thin liquids. At that time pt shared that he had a h/o dysphagia after initial MG dx requiring feeding tube and then thickened liquids, but that he is able to eat/drink regular consistencies if he leans forward during eating/drinking to swallow more safely. This was observed to reduce coughing during this eval, which was suspected to be related to improved oral containment/timing of swallow.  Subjective: Pt stated he "coughs with thin mostly"; some nasal regurgitation/hx of MG Assessment / Plan / Recommendation CHL IP CLINICAL IMPRESSIONS 08/23/2017 Clinical Impression Pt  exhibits moderate oropharyngeal dysphagia characterized by oral phase premature spillage into vallecular space and trace silent aspiration with thin via tsp/cup and nectar via cup with pt requiring cues to expectorate aspirate from airway; chin tuck and small sips assisted with eliminating aspiration during swallow d/t various contributing factors including decreased epiglottic inversion, reduced airway closure/reduced laryngeal elevation/mobility, Vallecular/pyriform residue noted with all consistencies ranging from mild-moderate with swallowing strategies utilized including repetitive swallows, throat clear/cough and re-swallow and chin tuck with clearance reduced to mild within vallecular space after strategies used, but not eliminating residue.  More  residue noted with nectar-thick liquids than thin generating recommendation of thin liquids reducing aspiration risk with use of swallowing strategies; pt continues to be a mild-mod aspiration risk with use of strategies d/t amount of residue within vallecular space/fatigue with meals, so Dysphagia 2/thin via small sips with chin tuck and above strategies recommended; may consider palliative consult d/t risk for aspiration and fair prognosis.  SLP Visit Diagnosis Dysphagia, oropharyngeal phase (R13.12) Attention and concentration deficit following -- Frontal lobe and executive function deficit following -- Impact on safety and function Moderate aspiration risk   CHL IP TREATMENT RECOMMENDATION 08/23/2017 Treatment Recommendations Therapy as outlined in treatment plan below   Prognosis 08/23/2017 Prognosis for Safe Diet Advancement Fair Barriers to Reach Goals -- Barriers/Prognosis Comment -- CHL IP DIET RECOMMENDATION 08/23/2017 SLP Diet Recommendations Dysphagia 2 (Fine chop) solids;Thin liquid;Other (small sips/chin tuck) Liquid Administration via Cup;Other (small sips/chin tuck) Medication Administration Crushed with puree Compensations Slow rate;Small sips/bites;Multiple dry swallows after each bite/sip;Clear throat after each swallow;Effortful swallow;Chin tuck Postural Changes Seated upright at 90 degrees   CHL IP OTHER RECOMMENDATIONS 08/23/2017 Recommended Consults -- Oral Care Recommendations Oral care BID Other Recommendations --   CHL IP FOLLOW UP RECOMMENDATIONS 08/23/2017 Follow up Recommendations Home health SLP;Outpatient SLP for RMST/dysphagia management   CHL IP FREQUENCY AND DURATION 08/23/2017 Speech Therapy Frequency (ACUTE ONLY) min 2x/week Treatment Duration 1 week      CHL IP ORAL PHASE 08/23/2017 Oral Phase Impaired Oral - Pudding Teaspoon -- Oral - Pudding Cup -- Oral - Honey Teaspoon -- Oral - Honey Cup -- Oral - Nectar Teaspoon -- Oral - Nectar Cup -- Oral - Nectar Straw -- Oral - Thin Teaspoon  Premature spillage Oral - Thin Cup Premature spillage Oral - Thin Straw Other (Comment) Oral - Puree Premature spillage Oral - Mech Soft -- Oral - Regular -- Oral - Multi-Consistency -- Oral - Pill -- Oral Phase - Comment --  CHL IP PHARYNGEAL PHASE 08/23/2017 Pharyngeal Phase Impaired Pharyngeal- Pudding Teaspoon -- Pharyngeal -- Pharyngeal- Pudding Cup -- Pharyngeal -- Pharyngeal- Honey Teaspoon -- Pharyngeal -- Pharyngeal- Honey Cup -- Pharyngeal -- Pharyngeal- Nectar Teaspoon -- Pharyngeal -- Pharyngeal- Nectar Cup -- Pharyngeal -- Pharyngeal- Nectar Straw -- Pharyngeal -- Pharyngeal- Thin Teaspoon Delayed swallow initiation-vallecula;Reduced epiglottic inversion;Reduced laryngeal elevation;Reduced airway/laryngeal closure;Penetration/Aspiration during swallow;Trace aspiration;Pharyngeal residue - valleculae;Pharyngeal residue - pyriform;Compensatory strategies attempted (with notebox) Pharyngeal Material enters airway, passes BELOW cords without attempt by patient to eject out (silent aspiration);Other (Comment) Pharyngeal- Thin Cup Penetration/Aspiration during swallow;Reduced airway/laryngeal closure;Reduced laryngeal elevation;Reduced epiglottic inversion;Delayed swallow initiation-vallecula;Compensatory strategies attempted (with notebox);Pharyngeal residue - valleculae;Trace aspiration Pharyngeal Material enters airway, passes BELOW cords without attempt by patient to eject out (silent aspiration);Other (Comment) Pharyngeal- Thin Straw -- Pharyngeal -- Pharyngeal- Puree Delayed swallow initiation-vallecula;Pharyngeal residue - valleculae;Compensatory strategies attempted (with notebox) Pharyngeal -- Pharyngeal- Mechanical Soft Delayed swallow initiation-vallecula;Pharyngeal residue - valleculae;Compensatory strategies attempted (with notebox) Pharyngeal -- Pharyngeal- Regular -- Pharyngeal --  Pharyngeal- Multi-consistency -- Pharyngeal -- Pharyngeal- Pill -- Pharyngeal -- Pharyngeal Comment (No Data)  CHL  IP CERVICAL ESOPHAGEAL PHASE 08/23/2017 Cervical Esophageal Phase WFL Pudding Teaspoon -- Pudding Cup -- Honey Teaspoon -- Honey Cup -- Nectar Teaspoon -- Nectar Cup -- Nectar Straw -- Thin Teaspoon -- Thin Cup -- Thin Straw -- Puree -- Mechanical Soft -- Regular -- Multi-consistency -- Pill -- Cervical Esophageal Comment -- No flowsheet data found. Elvina Sidle, M.S., CCC-SLP 08/23/2017, 11:36 AM                Discharge Exam: BP 109/68 (BP Location: Left Arm)   Pulse 79   Temp 98.5 F (36.9 C) (Oral)   Resp 18   Ht 6\' 1"  (1.854 m)   Wt 85.3 kg (188 lb)   SpO2 92%   BMI 24.80 kg/m   Constitutional:chronically ill appearing elderly male, no distress Eyes: EOMI, anicteric, normal conjunctivae ENMT: Oropharynx with moist mucous membranes, poor dentition Cardiovascular: RRR no MRGs, with no peripheral edema Respiratory: Normal respiratory effort on room air,no rales, rhonchi throughout with congestion from upper airway Abdomen: Soft,non-tender,  Skin: No rash ulcers, or lesions. Without skin tenting  Neurologic: Grossly no focal neuro deficit.  Psychiatric:Appropriate affect, and mood. Mental status alert, oriented to person, place, time     Discharge Instructions You were cared for by a hospitalist during your hospital stay. If you have any questions about your discharge medications or the care you received while you were in the hospital after you are discharged, you can call the unit and asked to speak with the hospitalist on call if the hospitalist that took care of you is not available. Once you are discharged, your primary care physician will handle any further medical issues. Please note that NO REFILLS for any discharge medications will be authorized once you are discharged, as it is imperative that you return to your primary care physician (or establish a relationship with a primary care physician if you do not have one) for your aftercare needs so that they can reassess your need for  medications and monitor your lab values.  Discharge Instructions    Diet - low sodium heart healthy   Complete by:  As directed    Diet - low sodium heart healthy   Complete by:  As directed    Increase activity slowly   Complete by:  As directed    Increase activity slowly   Complete by:  As directed      Allergies as of 08/25/2017   No Known Allergies     Medication List    STOP taking these medications   clopidogrel 75 MG tablet Commonly known as:  PLAVIX   potassium chloride 20 MEQ/15ML (10%) Soln     TAKE these medications   acetaminophen 325 MG tablet Commonly known as:  TYLENOL Take 650 mg by mouth every 6 (six) hours as needed for moderate pain.   apixaban 5 MG Tabs tablet Commonly known as:  ELIQUIS Take 1 tablet (5 mg total) by mouth 2 (two) times daily.   aspirin EC 81 MG tablet Take 81 mg by mouth daily.   atorvastatin 10 MG tablet Commonly known as:  LIPITOR Take 10 mg by mouth every evening.   azaTHIOprine 50 MG tablet Commonly known as:  IMURAN Take 50 mg by mouth 2 (two) times daily.   benzonatate 100 MG capsule Commonly known as:  TESSALON Take 100 mg by mouth at bedtime.   COMBIVENT RESPIMAT 20-100  MCG/ACT Aers respimat Generic drug:  Ipratropium-Albuterol Inhale 2 puffs into the lungs 2 (two) times daily.   docusate sodium 100 MG capsule Commonly known as:  COLACE Take 100 mg by mouth 2 (two) times daily.   DULERA 100-5 MCG/ACT Aero Generic drug:  mometasone-formoterol Take 2 puffs by mouth 2 (two) times daily.   Fiber 0.52 g Caps Take 0.52 g by mouth at bedtime.   fluticasone 50 MCG/ACT nasal spray Commonly known as:  FLONASE Place 1 spray into both nostrils daily.   furosemide 40 MG tablet Commonly known as:  LASIX Take 40 mg by mouth daily.   guaiFENesin 600 MG 12 hr tablet Commonly known as:  MUCINEX Take 600 mg by mouth 2 (two) times daily.   levothyroxine 200 MCG tablet Commonly known as:  SYNTHROID,  LEVOTHROID Take 200 mcg by mouth daily.   losartan 25 MG tablet Commonly known as:  COZAAR Take 0.5 tablets (12.5 mg total) by mouth daily. What changed:  how much to take   magnesium hydroxide 400 MG/5ML suspension Commonly known as:  MILK OF MAGNESIA Take 30 mLs by mouth See admin instructions. every 24 hours as needed for constipation.   metoprolol succinate 50 MG 24 hr tablet Commonly known as:  TOPROL-XL Take 2 tablets (100 mg total) by mouth daily. What changed:  how much to take   oxyCODONE 30 MG 12 hr tablet Commonly known as:  OXYCONTIN Take 1 tablet (30 mg total) by mouth every 12 (twelve) hours.   pantoprazole 40 MG tablet Commonly known as:  PROTONIX Take 40 mg by mouth 2 (two) times daily.   polycarbophil 625 MG tablet Commonly known as:  FIBERCON Take 1 tablet (625 mg total) by mouth at bedtime.   predniSONE 10 MG tablet Commonly known as:  DELTASONE Take 1 tablet (10 mg total) by mouth daily with breakfast. What changed:  how much to take   senna 8.6 MG tablet Commonly known as:  SENOKOT Take 1 tablet by mouth every evening.   sertraline 100 MG tablet Commonly known as:  ZOLOFT Take 150 mg by mouth daily.   SIMBRINZA 1-0.2 % Susp Generic drug:  Brinzolamide-Brimonidine Place 1 drop into both eyes 2 (two) times daily.   temazepam 30 MG capsule Commonly known as:  RESTORIL Take 30 mg by mouth at bedtime.   TRAVATAN Z 0.004 % Soln ophthalmic solution Generic drug:  Travoprost (BAK Free) Place 1 drop into both eyes at bedtime.      No Known Allergies Follow-up Information    [facility] Follow up.   Specialty:  Home Health Services Why:  [place], [place], [place], [place] Contact information: [address] [place] [address] [phone]            The results of significant diagnostics from this hospitalization (including imaging, microbiology, ancillary and laboratory) are listed below for reference.    Significant  Diagnostic Studies: Dg Chest 2 View  Result Date: 08/22/2017 CLINICAL DATA:  Hypoxia, COPD and hypertension.  Nonsmoker. EXAM: CHEST - 2 VIEW COMPARISON:  08/15/2017 FINDINGS: Stable cardiomegaly. Mild aortic atherosclerosis. Central vascular congestion is noted with left basilar atelectasis and small posterior pleural effusion. No acute pulmonary consolidation. No pneumothorax. Trace fluid in the left major fissure. No acute osseous abnormality. IMPRESSION: Stable cardiomegaly with aortic atherosclerosis and central vascular congestion. Left basilar atelectasis with small left effusion persists without significant change. Electronically Signed   By: Ashley Royalty M.D.   On: 08/22/2017  20:34   Dg Chest 2 View  Result Date: 08/15/2017 CLINICAL DATA:  Short of breath, wheezing, cough for a week, history of recent pneumonia EXAM: CHEST - 2 VIEW COMPARISON:  CT chest of 02/04/2017 and chest x-ray of the same day FINDINGS: The lungs are not as well aerated but the right basilar pneumonia has cleared. However, as a result of the poor inspiration, is difficult to exclude pneumonia remaining at the left lung base in the right mid upper lung. A tiny effusion may be present blunting the costophrenic angles on the lateral view. Cardiomegaly is stable. No bony abnormality is seen. There are degenerative changes both shoulders. IMPRESSION: 1. Poor inspiration but clearing of the right basilar pneumonia. Otherwise patchy pneumonia in the right mid lung and left lung base cannot be excluded. 2. Stable cardiomegaly. Electronically Signed   By: Ivar Drape M.D.   On: 08/15/2017 13:40   Ct Chest Wo Contrast  Result Date: 08/15/2017 CLINICAL DATA:  84 year old male with productive cough for 2 days and shortness of breath for 4 months. EXAM: CT CHEST WITHOUT CONTRAST TECHNIQUE: Multidetector CT imaging of the chest was performed following the standard protocol without IV contrast. COMPARISON:  08/15/2017 and prior chest  radiographs. 02/04/2017 chest CT FINDINGS: Cardiovascular: Cardiomegaly again noted. Coronary artery and thoracic aortic atherosclerotic calcifications again identified. No thoracic aortic aneurysm or pericardial effusion. Mediastinum/Nodes: Unchanged mediastinal lymph nodes with index 11 mm RIGHT paratracheal node (series 3: Image 33) and 6 mm AP window node (3:54). No new or enlarging lymph nodes within the chest. No mediastinal mass. The visualized thyroid gland and esophagus are unremarkable. Lungs/Pleura: Consolidation of the LEFT LOWER lobe again noted which may be persistent or recurrent. Patchy ill-defined opacities within the lingula and RIGHT LOWER lobe noted. A new 1 x 2.3 cm slightly irregular RIGHT UPPER lobe nodular opacity (4:51) is identified. Very small bilateral pleural effusions are noted with mild bibasilar atelectasis. There is no evidence of pneumothorax. Upper Abdomen: No acute abnormality Musculoskeletal: No acute abnormality or suspicious bony lesion. Bridging syndesmophytes/DISH changes throughout the thoracic spine again noted. IMPRESSION: 1. LEFT LOWER lobe consolidation, scattered patchy lingular and RIGHT LOWER lobe opacities and new 1 x 2.3 cm RIGHT UPPER lobe nodular opacity. Given that some of these opacities are likely infectious/pneumonia, recommend CT follow-up in 8-12 weeks following appropriate antibiotic therapy. 2. Very small bilateral pleural effusions. 3. Cardiomegaly 4. Coronary artery and Aortic Atherosclerosis (ICD10-I70.0). Electronically Signed   By: Margarette Canada M.D.   On: 08/15/2017 17:27   Dg Chest Port 1 View  Result Date: 08/24/2017 CLINICAL DATA:  Dyspnea EXAM: PORTABLE CHEST 1 VIEW COMPARISON:  08/22/2017 FINDINGS: Cardiac shadow remains enlarged. Aortic calcifications are again seen. Increasing left basilar infiltrate is noted with associated effusion. Right lung is clear with some patchy changes in the right mid lung and right base. These are slightly  increased when compared with the prior exam. No bony abnormality is seen. IMPRESSION: Increasing bilateral infiltrative changes. Electronically Signed   By: Inez Catalina M.D.   On: 08/24/2017 10:07   Dg Swallowing Func-speech Pathology  Result Date: 08/23/2017 Objective Swallowing Evaluation: Type of Study: MBS-Modified Barium Swallow Study  Patient Details Name: Travis Ross MRN: [id number] Date of Birth: Dec 11, 1926 Today's Date: 08/23/2017 Time: SLP Start Time (ACUTE ONLY): 36 -SLP Stop Time (ACUTE ONLY): 1015 SLP Time Calculation (min) (ACUTE ONLY): 10 min Past Medical History: Past Medical History: Diagnosis Date . CAP (community acquired pneumonia) 02/04/2017 . COPD with chronic  bronchitis and emphysema ([place] [place])  . Essential hypertension  . Femur fracture, right ([place])   Related to a fall. Periprosthetic distal femur fracture (close to the right knee prosthesis).  > Initial plans were to treat with brace, however, has now progressed and may require surgery . GERD (gastroesophageal reflux disease)  . Glaucoma  . History of Cervical spinal cord compression w/ residual hand parestheisa  . Hyperlipidemia  . Hypothyroidism (acquired)   On Levothyroxine . Myasthenia gravis ([facility])   Now (prior to recent femur fracture) was relegated to maneuvering himself around on a Rolling Walker (Rollator) using his feet & sitting on the walker.  Does not have arm strength to mover a wheelchair or transfer.;  . Osteoarthritis   Global osteoarthritis involving back, hips and knees as well as elbows. . Paresthesia   bilateral upper extremities, very weak ; poor grip  (must use adaptable silverware );  . Permanent atrial fibrillation (HCC)   Long-standing (> 4 yrs). Initially evaluated with Stress Test & Echo - Pt reports that these studies were "normal" Past Surgical History: Past Surgical History: Procedure Laterality Date . BACK SURGERY    x 6 . CARPAL TUNNEL RELEASE   . CHOLECYSTECTOMY   . ELBOW SURGERY  08/2015  Total of 3  surgeries . REPLACEMENT TOTAL KNEE Bilateral   Right TKA 2003, left TKA 2010   . TOTAL KNEE REVISION Right 12/15/2016  Procedure: RIGHT TOTAL KNEE REVISION;  Surgeon: Gaynelle Arabian, MD;  Location: [facility];  Service: Orthopedics;  Laterality: Right; HPI: Pt is an 84 y.o. male with a PMH of afib; myasthenia gravis; hypothyroidism; HLD; hand paresthesias related to C-spine disease; glaucoma; HTN; and COPD who presents with SOB and recurrent respiratory illness since December. CT Chest showed LLL consolidation (which may be persistent or recurrent), RLL opacities, and RUL nodular opacity. BSE in December 2018 recommended regular diet and thin liquids. At that time pt shared that he had a h/o dysphagia after initial MG dx requiring feeding tube and then thickened liquids, but that he is able to eat/drink regular consistencies if he leans forward during eating/drinking to swallow more safely. This was observed to reduce coughing during this eval, which was suspected to be related to improved oral containment/timing of swallow.  Subjective: Pt stated he "coughs with thin mostly"; some nasal regurgitation/hx of MG Assessment / Plan / Recommendation CHL IP CLINICAL IMPRESSIONS 08/23/2017 Clinical Impression Pt exhibits moderate oropharyngeal dysphagia characterized by oral phase premature spillage into vallecular space and trace silent aspiration with thin via tsp/cup and nectar via cup with pt requiring cues to expectorate aspirate from airway; chin tuck and small sips assisted with eliminating aspiration during swallow d/t various contributing factors including decreased epiglottic inversion, reduced airway closure/reduced laryngeal elevation/mobility, Vallecular/pyriform residue noted with all consistencies ranging from mild-moderate with swallowing strategies utilized including repetitive swallows, throat clear/cough and re-swallow and chin tuck with clearance reduced to mild within vallecular space after strategies used,  but not eliminating residue.  More residue noted with nectar-thick liquids than thin generating recommendation of thin liquids reducing aspiration risk with use of swallowing strategies; pt continues to be a mild-mod aspiration risk with use of strategies d/t amount of residue within vallecular space/fatigue with meals, so Dysphagia 2/thin via small sips with chin tuck and above strategies recommended; may consider palliative consult d/t risk for aspiration and fair prognosis.  SLP Visit Diagnosis Dysphagia, oropharyngeal phase (R13.12) Attention and concentration deficit following -- Frontal lobe and executive function deficit following --  Impact on safety and function Moderate aspiration risk   CHL IP TREATMENT RECOMMENDATION 08/23/2017 Treatment Recommendations Therapy as outlined in treatment plan below   Prognosis 08/23/2017 Prognosis for Safe Diet Advancement Fair Barriers to Reach Goals -- Barriers/Prognosis Comment -- CHL IP DIET RECOMMENDATION 08/23/2017 SLP Diet Recommendations Dysphagia 2 (Fine chop) solids;Thin liquid;Other (small sips/chin tuck) Liquid Administration via Cup;Other (small sips/chin tuck) Medication Administration Crushed with puree Compensations Slow rate;Small sips/bites;Multiple dry swallows after each bite/sip;Clear throat after each swallow;Effortful swallow;Chin tuck Postural Changes Seated upright at 90 degrees   CHL IP OTHER RECOMMENDATIONS 08/23/2017 Recommended Consults -- Oral Care Recommendations Oral care BID Other Recommendations --   CHL IP FOLLOW UP RECOMMENDATIONS 08/23/2017 Follow up Recommendations Home health SLP;Outpatient SLP for RMST/dysphagia management   CHL IP FREQUENCY AND DURATION 08/23/2017 Speech Therapy Frequency (ACUTE ONLY) min 2x/week Treatment Duration 1 week      CHL IP ORAL PHASE 08/23/2017 Oral Phase Impaired Oral - Pudding Teaspoon -- Oral - Pudding Cup -- Oral - Honey Teaspoon -- Oral - Honey Cup -- Oral - Nectar Teaspoon -- Oral - Nectar Cup -- Oral -  Nectar Straw -- Oral - Thin Teaspoon Premature spillage Oral - Thin Cup Premature spillage Oral - Thin Straw Other (Comment) Oral - Puree Premature spillage Oral - Mech Soft -- Oral - Regular -- Oral - Multi-Consistency -- Oral - Pill -- Oral Phase - Comment --  CHL IP PHARYNGEAL PHASE 08/23/2017 Pharyngeal Phase Impaired Pharyngeal- Pudding Teaspoon -- Pharyngeal -- Pharyngeal- Pudding Cup -- Pharyngeal -- Pharyngeal- Honey Teaspoon -- Pharyngeal -- Pharyngeal- Honey Cup -- Pharyngeal -- Pharyngeal- Nectar Teaspoon -- Pharyngeal -- Pharyngeal- Nectar Cup -- Pharyngeal -- Pharyngeal- Nectar Straw -- Pharyngeal -- Pharyngeal- Thin Teaspoon Delayed swallow initiation-vallecula;Reduced epiglottic inversion;Reduced laryngeal elevation;Reduced airway/laryngeal closure;Penetration/Aspiration during swallow;Trace aspiration;Pharyngeal residue - valleculae;Pharyngeal residue - pyriform;Compensatory strategies attempted (with notebox) Pharyngeal Material enters airway, passes BELOW cords without attempt by patient to eject out (silent aspiration);Other (Comment) Pharyngeal- Thin Cup Penetration/Aspiration during swallow;Reduced airway/laryngeal closure;Reduced laryngeal elevation;Reduced epiglottic inversion;Delayed swallow initiation-vallecula;Compensatory strategies attempted (with notebox);Pharyngeal residue - valleculae;Trace aspiration Pharyngeal Material enters airway, passes BELOW cords without attempt by patient to eject out (silent aspiration);Other (Comment) Pharyngeal- Thin Straw -- Pharyngeal -- Pharyngeal- Puree Delayed swallow initiation-vallecula;Pharyngeal residue - valleculae;Compensatory strategies attempted (with notebox) Pharyngeal -- Pharyngeal- Mechanical Soft Delayed swallow initiation-vallecula;Pharyngeal residue - valleculae;Compensatory strategies attempted (with notebox) Pharyngeal -- Pharyngeal- Regular -- Pharyngeal -- Pharyngeal- Multi-consistency -- Pharyngeal -- Pharyngeal- Pill -- Pharyngeal  -- Pharyngeal Comment (No Data)  CHL IP CERVICAL ESOPHAGEAL PHASE 08/23/2017 Cervical Esophageal Phase WFL Pudding Teaspoon -- Pudding Cup -- Honey Teaspoon -- Honey Cup -- Nectar Teaspoon -- Nectar Cup -- Nectar Straw -- Thin Teaspoon -- Thin Cup -- Thin Straw -- Puree -- Mechanical Soft -- Regular -- Multi-consistency -- Pill -- Cervical Esophageal Comment -- No flowsheet data found. Elvina Sidle, M.S., [place] 08/23/2017, 11:36 AM               Microbiology: Recent Results (from the past 240 hour(s))  Culture, blood (routine x 2) Call MD if unable to obtain prior to antibiotics being given     Status: None   Collection Time: 08/15/17  4:18 PM  Result Value Ref Range Status   Specimen Description BLOOD RIGHT HAND  Final   Special Requests   Final    BOTTLES DRAWN AEROBIC AND ANAEROBIC Blood Culture adequate volume   Culture   Final    NO GROWTH 5 DAYS Performed at [facility], [place] [address], [place], [place] [address]    Report Status 08/20/2017 FINAL  Final  Culture, blood (Routine X 2) w Reflex to ID Panel     Status: None   Collection Time: 08/16/17  5:10 AM  Result Value Ref Range Status   Specimen Description BLOOD RIGHT HAND  Final   Special Requests   Final    BOTTLES DRAWN AEROBIC AND ANAEROBIC Blood Culture adequate volume   Culture   Final    NO GROWTH 5 DAYS Performed at [facility], [place] [address], [place], [place] [address]    Report Status 08/21/2017 FINAL  Final  MRSA PCR Screening     Status: None   Collection Time: 08/16/17  6:41 PM  Result Value Ref Range Status   MRSA by PCR NEGATIVE NEGATIVE Final    Comment:        The GeneXpert MRSA Assay (FDA approved for NASAL specimens only), is one component of a comprehensive MRSA colonization surveillance program. It is not intended to diagnose MRSA infection nor to guide or monitor treatment for MRSA infections. Performed at [facility], [place] [address], [place], [place] [address]       Labs: Basic Metabolic Panel: Recent Labs  Lab 08/19/17 0410 08/20/17 0453 08/21/17 0343 08/22/17 0426 08/23/17 0311  NA 141 139 140 139 139  K 4.1 3.9 3.8 3.8 4.2  CL 101 99* 99* 95* 95*  CO2 33* 32 33* 32 36*  GLUCOSE 104* 100* 94 104* 105*  BUN 19 19 16 15 18   CREATININE 0.77 0.80 0.81 0.82 1.00  CALCIUM 8.6* 8.6* 8.6* 8.9 8.9  MG 1.9 1.8 1.9 1.8 1.9   Liver Function Tests: No results for input(s): AST, ALT, ALKPHOS, BILITOT, PROT, ALBUMIN in the last 168 hours. No results for input(s): LIPASE, AMYLASE in the last 168 hours. No results for input(s): AMMONIA in the last 168 hours. CBC: Recent Labs  Lab 08/19/17 0410 08/20/17 0453 08/21/17 0343 08/22/17 0426 08/23/17 0311  WBC 9.8 9.8 10.1 12.6* 10.8*  HGB 10.0* 10.3* 10.3* 11.2* 10.6*  HCT 33.9* 34.8* 34.6* 37.9* 36.2*  MCV 89.2 89.0 88.0 89.0 90.5  PLT 171 196 216 222 208   Cardiac Enzymes: No results for input(s): CKTOTAL, CKMB, CKMBINDEX, TROPONINI in the last 168 hours. BNP: BNP (last 3 results) Recent Labs    02/04/17 0150 08/15/17 1229 08/22/17 1234  BNP 397.0* 338.7* 436.6*    ProBNP (last 3 results) No results for input(s): PROBNP in the last 8760 hours.  CBG: Recent Labs  Lab 08/22/17 1211  GLUCAP 117*       Signed:  Desiree Hane, MD [facility] 08/25/2017, 12:10 PM

## 2017-08-24 NOTE — Progress Notes (Signed)
PROGRESS NOTE  Travis Ross YWV:[id number] DOB: 1926/04/15 DOA: 08/15/2017 PCP: [name], MD  HPI  Travis Ross is a 84 y.o. year old male with medical history significant for myasthenia gravis  (last criss many years ago), hypothyroidism, HLD, CHF, COPD and recurrent pneumonias over the past 6 months who presented on 08/15/2017 with SOB and was found to have acute on chronic respiratory failure of multifactorial etiology presumed related to aspiration pneumonia, COPD, and CHF exacerbation.  Interval History  No acute events overnight  ROS:    Subjective Feeling well. Feels ok breathing but worried about going back to ALF and having to come back because of breathing  Assessment/Plan: Principal Problem:   Acute respiratory failure with hypoxia (HCC) Active Problems:   Hyperlipidemia   COPD with chronic bronchitis and emphysema (HCC)   GERD (gastroesophageal reflux disease)   Hypothyroidism (acquired)   History of Cervical spinal cord compression (HCC)   Myasthenia gravis (HCC)   Acute on chronic systolic heart failure (HCC)   Acute hypokalemia   Decubitus ulcer of sacral region, stage 1  Acute on chronic respiratory failure, stable. Multifactorial etiology(COPD, Asp pna, CHF exacerbation). Fepeat CXR shows slight increase in infiltrate, with 2lb weight gain and increased wheezing/crackles on exam concern for increased volume. Given IV lasix 40 mg x1. Then resume oral lasix this evening. Reassess in am for possible discharge. Encourage flutter valve, out of bed to chair  Multifocal PNA, resolved. Demonsrated LLL and RLL and RUL opacities on CT imaging. Completed 7 day course of IV cefepime. Repeat CXR shows slight increase in basilar opacity but think more consistent with fluid given weight gain and crackles/wheezing on exam. IV lasix now and reassess. Pulm agrees not bronchoscopy candidate because poor respiratory status from myasthenia gravis Briefly on vancomycin but no  MRSA on blood cultures. This is 6th episode of pna ( previously treated with levaquin). Pulmonology recommended speech evaluation to work up dysphagia. Patient's neuromuscular disease and poor functional capicay would be high risk for bronchoscopy and necessary intubation. Additionally, patient and daughter agreed if it was a malignancy he would not want chemotherapy, radiation or surgery. Will follow up as outpatient on 07/2  Acute on chronic systolic CHF, slightly worse. 2 lb weight gain, slightly increased opacity at bases on CXR. IV lasix and reassess as mentioned in #1. Stable oxygen requirements.  No acute changes in EF on TTE.  Net negative 9 L with diuresis this admission.  Continue betablocker and losartan.   Dysphagia with silent aspiration. MBS confirms. Dysphagia 2 diet  Hypothyroidism, stable. TSH wnl. Continue synthroid 200 mcg  COPD, stable. Duo-nebsXopenex and dulera scheduled. Continue chronic prednisone 5 mg  Atrial Fibrillation, rate controlled. Continue eliquis and toprol.   Myasthenia gravis, stable. Though I wonder how much his aspiration can be attributed to his MG. Continue imuran   Depression/Anxiety, stable, continue zoloft and restoril  Stage 1 decubitus ulcer of sacrum, POA. Stable air mattress  HLD, stable. lipitor  GERD, stable, protonix  History of cervical spinal cord compression. Chronic paresthesias makes ADLs difficult.   Code Status: DNR   Family Communication: Called daughter [name] on phone and left a message  Disposition Plan: plan to dc back to ALF on tomorrow if volume status improved   Consultants:  Palliative, Pulm   Procedures:  TTE- 6/19 Left ventricle: The cavity size was normal. Wall thickness was   increased in a pattern of mild LVH. Systolic function was mildly   to moderately reduced. The  estimated ejection fraction was in the   range of 40% to 45%. The study is not technically sufficient to   allow evaluation of LV diastolic  function. - Mitral valve: Mildly calcified annulus. Mildly thickened, mildly   calcified leaflets . There was mild regurgitation. - Left atrium: The atrium was severely dilated. - Right atrium: The atrium was moderately to severely dilated. - Tricuspid valve: There was moderate regurgitation. - Pulmonary arteries: Systolic pressure was moderately increased.   PA peak pressure: 54 mm Hg (S).    Carotid Ultrasound- 6/25  Antimicrobials: Anti-infectives (From admission, onward)   Start     Dose/Rate Route Frequency Ordered Stop   08/16/17 1800  azithromycin (ZITHROMAX) 500 mg in sodium chloride 0.9 % 250 mL IVPB  Status:  Discontinued     500 mg 250 mL/hr over 60 Minutes Intravenous Every 24 hours 08/15/17 1700 08/15/17 1732   08/16/17 0630  vancomycin (VANCOCIN) IVPB 1000 mg/200 mL premix  Status:  Discontinued     1,000 mg 200 mL/hr over 60 Minutes Intravenous Every 12 hours 08/15/17 1750 08/17/17 0759   08/16/17 0000  cefTRIAXone (ROCEPHIN) 1 g in sodium chloride 0.9 % 100 mL IVPB  Status:  Discontinued     1 g 200 mL/hr over 30 Minutes Intravenous Every 24 hours 08/15/17 1700 08/15/17 1732   08/15/17 1900  ceFEPIme (MAXIPIME) 2 g in sodium chloride 0.9 % 100 mL IVPB     2 g 200 mL/hr over 30 Minutes Intravenous Every 12 hours 08/15/17 1750 08/22/17 0647   08/15/17 1830  vancomycin (VANCOCIN) 1,500 mg in sodium chloride 0.9 % 500 mL IVPB     1,500 mg 250 mL/hr over 120 Minutes Intravenous  Once 08/15/17 1750 08/16/17 0909   08/15/17 1545  cefTRIAXone (ROCEPHIN) 1 g in sodium chloride 0.9 % 100 mL IVPB     1 g 200 mL/hr over 30 Minutes Intravenous  Once 08/15/17 1537 08/15/17 1638   08/15/17 1545  azithromycin (ZITHROMAX) 500 mg in sodium chloride 0.9 % 250 mL IVPB     500 mg 250 mL/hr over 60 Minutes Intravenous  Once 08/15/17 1537 08/15/17 1911        Cultures:  Blood, 6/18: no growth Telemetry: yes  DVT prophylaxis: Apixaban  Objective: Vitals:   08/23/17 2028  08/23/17 2300 08/24/17 0545 08/24/17 0920  BP:  121/71    Pulse:  75    Resp:  19    Temp:  98.4 F (36.9 C)    TempSrc:  Oral    SpO2: 98% 99%  93%  Weight:   85.3 kg (188 lb)   Height:        Intake/Output Summary (Last 24 hours) at 08/24/2017 1115 Last data filed at 08/24/2017 0944 Gross per 24 hour  Intake 240 ml  Output 900 ml  Net -660 ml   Filed Weights   08/19/17 0500 08/23/17 0500 08/24/17 0545  Weight: 85.3 kg (188 lb) 84.4 kg (186 lb) 85.3 kg (188 lb)    Exam:  Constitutional:elderly male, no distress Eyes: EOMI, anicteric, normal conjunctivae ENMT: Oropharynx with moist mucous membranes, poor dentition Cardiovascular: RRR no MRGs, with trace peripheral edema Respiratory: Normal respiratory effort on 3L [place],+rales at bases, rhonchi throughout and minimal wheezing Abdomen: Soft,non-tender,  Skin: No rash ulcers, or lesions. Without skin tenting  Neurologic: Grossly no focal neuro deficit. Psychiatric:Appropriate affect, and mood. Mental status alert, oriented to person, place, time  Data Reviewed: CBC: Recent Labs  Lab 08/19/17 0410  08/20/17 0453 08/21/17 0343 08/22/17 0426 08/23/17 0311  WBC 9.8 9.8 10.1 12.6* 10.8*  HGB 10.0* 10.3* 10.3* 11.2* 10.6*  HCT 33.9* 34.8* 34.6* 37.9* 36.2*  MCV 89.2 89.0 88.0 89.0 90.5  PLT 171 196 216 222 426   Basic Metabolic Panel: Recent Labs  Lab 08/19/17 0410 08/20/17 0453 08/21/17 0343 08/22/17 0426 08/23/17 0311  NA 141 139 140 139 139  K 4.1 3.9 3.8 3.8 4.2  CL 101 99* 99* 95* 95*  CO2 33* 32 33* 32 36*  GLUCOSE 104* 100* 94 104* 105*  BUN 19 19 16 15 18   CREATININE 0.77 0.80 0.81 0.82 1.00  CALCIUM 8.6* 8.6* 8.6* 8.9 8.9  MG 1.9 1.8 1.9 1.8 1.9   GFR: Estimated Creatinine Clearance: 57.7 mL/min (by C-G formula based on SCr of 1 mg/dL). Liver Function Tests: No results for input(s): AST, ALT, ALKPHOS, BILITOT, PROT, ALBUMIN in the last 168 hours. No results for input(s): LIPASE, AMYLASE in the last  168 hours. No results for input(s): AMMONIA in the last 168 hours. Coagulation Profile: No results for input(s): INR, PROTIME in the last 168 hours. Cardiac Enzymes: No results for input(s): CKTOTAL, CKMB, CKMBINDEX, TROPONINI in the last 168 hours. BNP (last 3 results) No results for input(s): PROBNP in the last 8760 hours. HbA1C: No results for input(s): HGBA1C in the last 72 hours. CBG: Recent Labs  Lab 08/17/17 1629 08/22/17 1211  GLUCAP 106* 117*   Lipid Profile: No results for input(s): CHOL, HDL, LDLCALC, TRIG, CHOLHDL, LDLDIRECT in the last 72 hours. Thyroid Function Tests: No results for input(s): TSH, T4TOTAL, FREET4, T3FREE, THYROIDAB in the last 72 hours. Anemia Panel: No results for input(s): VITAMINB12, FOLATE, FERRITIN, TIBC, IRON, RETICCTPCT in the last 72 hours. Urine analysis:    Component Value Date/Time   COLORURINE YELLOW 08/16/2017 1038   APPEARANCEUR HAZY (A) 08/16/2017 1038   LABSPEC 1.010 08/16/2017 1038   PHURINE 5.0 08/16/2017 1038   GLUCOSEU NEGATIVE 08/16/2017 1038   HGBUR NEGATIVE 08/16/2017 1038   [place] 08/16/2017 1038   KETONESUR NEGATIVE 08/16/2017 1038   PROTEINUR NEGATIVE 08/16/2017 1038   NITRITE NEGATIVE 08/16/2017 1038   LEUKOCYTESUR NEGATIVE 08/16/2017 1038   Sepsis Labs: @LABRCNTIP (procalcitonin:4,lacticidven:4)  ) Recent Results (from the past 240 hour(s))  Culture, blood (routine x 2) Call MD if unable to obtain prior to antibiotics being given     Status: None   Collection Time: 08/15/17  4:18 PM  Result Value Ref Range Status   Specimen Description BLOOD RIGHT HAND  Final   Special Requests   Final    BOTTLES DRAWN AEROBIC AND ANAEROBIC Blood Culture adequate volume   Culture   Final    NO GROWTH 5 DAYS Performed at [facility], [place] [address], [place], [place] [address]    Report Status 08/20/2017 FINAL  Final  Culture, blood (Routine X 2) w Reflex to ID Panel     Status: None   Collection Time:  08/16/17  5:10 AM  Result Value Ref Range Status   Specimen Description BLOOD RIGHT HAND  Final   Special Requests   Final    BOTTLES DRAWN AEROBIC AND ANAEROBIC Blood Culture adequate volume   Culture   Final    NO GROWTH 5 DAYS Performed at [facility], [place] [address], [place],  [address]    Report Status 08/21/2017 FINAL  Final  MRSA PCR Screening     Status: None   Collection Time: 08/16/17  6:41 PM  Result Value Ref Range Status   MRSA by PCR NEGATIVE NEGATIVE Final    Comment:        The GeneXpert MRSA Assay (FDA approved for NASAL specimens only), is one component of a comprehensive MRSA colonization surveillance program. It is not intended to diagnose MRSA infection nor to guide or monitor treatment for MRSA infections. Performed at [facility], [place] [address], [place], [place] [address]       Studies: Dg Chest Port 1 View  Result Date: 08/24/2017 CLINICAL DATA:  Dyspnea EXAM: PORTABLE CHEST 1 VIEW COMPARISON:  08/22/2017 FINDINGS: Cardiac shadow remains enlarged. Aortic calcifications are again seen. Increasing left basilar infiltrate is noted with associated effusion. Right lung is clear with some patchy changes in the right mid lung and right base. These are slightly increased when compared with the prior exam. No bony abnormality is seen. IMPRESSION: Increasing bilateral infiltrative changes. Electronically Signed   By: Inez Catalina M.D.   On: 08/24/2017 10:07    Scheduled Meds: . apixaban  5 mg Oral BID  . aspirin EC  81 mg Oral Daily  . atorvastatin  10 mg Oral QPM  . azaTHIOprine  50 mg Oral BID  . docusate sodium  100 mg Oral BID  . fluticasone  1 spray Each Nare Daily  . furosemide  40 mg Intravenous Once  . furosemide  40 mg Oral Daily  . ipratropium-albuterol  3 mL Nebulization BID  . latanoprost  1 drop Both Eyes QHS  . levothyroxine  200 mcg Oral QAC breakfast  . losartan  12.5 mg Oral Daily  . magnesium oxide  400 mg Oral BID    . metoprolol succinate  100 mg Oral Daily  . mometasone-formoterol  2 puff Inhalation BID  . oxyCODONE  30 mg Oral Q12H  . pantoprazole  40 mg Oral BID  . predniSONE  5 mg Oral Q breakfast  . psyllium  1 packet Oral QHS  . senna  1 tablet Oral QPM  . sertraline  150 mg Oral Daily  . sodium chloride flush  3 mL Intravenous Q12H  . temazepam  30 mg Oral QHS    Continuous Infusions:   LOS: 9 days     Desiree Hane, MD [facility] Pager [phone]  If 7PM-7AM, please contact night-coverage [email or web address] Password [facility] 08/24/2017, 11:15 AM

## 2017-08-24 NOTE — Progress Notes (Signed)
  Speech Language Pathology Treatment: Dysphagia  Patient Details Name: Travis Ross MRN: [id number] DOB: 1926/09/24 Today's Date: 08/24/2017 Time: 9233-0076 SLP Time Calculation (min) (ACUTE ONLY): 19 min  Assessment / Plan / Recommendation Clinical Impression  Pt verbalizes the need for a chin tuck with Mod I, but needs Mod cues from SLP during thin liquid consumption to implement it. Delayed coughing was noted x1, concerning for delayed sensation of possible aspiration when not fully tucking his chin. Education was provided about strategies recommended, rationale, additional risk factors that can contribute to aspiration PNA such as poor oral hygiene. He verbalizes his understanding although he can also be quite distracted. Pt has complaints of being restricted by his diet, although with clarification, he mostly wants his foods to have more seasoning in them (as opposed to not liking the texture). SLP will continue to follow acutely, but he will also benefit from additional SLP f/u after d/c to maximize safety and continue to determine least restrictive diet options considering his overall GOC.   HPI HPI: Pt is an 84 y.o. male with a PMH of afib; myasthenia gravis; hypothyroidism; HLD; hand paresthesias related to C-spine disease; glaucoma; HTN; and COPD who presents with SOB and recurrent respiratory illness since December. CT Chest showed LLL consolidation (which may be persistent or recurrent), RLL opacities, and RUL nodular opacity. BSE in December 2018 recommended regular diet and thin liquids. At that time pt shared that he had a h/o dysphagia after initial MG dx requiring feeding tube and then thickened liquids, but that he is able to eat/drink regular consistencies if he leans forward during eating/drinking to swallow more safely. This was observed to reduce coughing during this eval, which was suspected to be related to improved oral containment/timing of swallow.      SLP Plan  Continue  with current plan of care       Recommendations  Diet recommendations: Dysphagia 2 (fine chop);Thin liquid Liquids provided via: Cup;No straw Medication Administration: Crushed with puree Supervision: Staff to assist with self feeding;Full supervision/cueing for compensatory strategies Compensations: Slow rate;Small sips/bites;Multiple dry swallows after each bite/sip;Clear throat after each swallow;Effortful swallow;Chin tuck Postural Changes and/or Swallow Maneuvers: Seated upright 90 degrees;Upright 30-60 min after meal;Chin tuck                Oral Care Recommendations: Oral care QID(frequent oral care due to aspiration risk) Follow up Recommendations: Home health SLP SLP Visit Diagnosis: Dysphagia, oropharyngeal phase (R13.12) Plan: Continue with current plan of care       GO                Germain Osgood 08/24/2017, 10:54 AM  Germain Osgood, M.A. CCC-SLP [phone]

## 2017-08-25 DIAGNOSIS — R0603 Acute respiratory distress: Secondary | ICD-10-CM

## 2017-08-25 DIAGNOSIS — J189 Pneumonia, unspecified organism: Secondary | ICD-10-CM

## 2017-08-25 MED ORDER — OXYCODONE HCL ER 30 MG PO T12A
30.0000 mg | EXTENDED_RELEASE_TABLET | Freq: Two times a day (BID) | ORAL | 0 refills | Status: AC
Start: 2017-08-25 — End: 2017-08-30

## 2017-08-25 MED ORDER — METOPROLOL SUCCINATE ER 50 MG PO TB24: 100.0000 mg | ORAL_TABLET | Freq: Every day | ORAL | Status: DC

## 2017-08-25 MED ORDER — APIXABAN 5 MG PO TABS: 5.0000 mg | ORAL_TABLET | Freq: Two times a day (BID) | ORAL | Status: DC

## 2017-08-25 NOTE — Progress Notes (Signed)
Physical Therapy Treatment Patient Details Name: Travis Ross MRN: [id number] DOB: 08-Jan-1927 Today's Date: 08/25/2017    History of Present Illness 84 y.o. male with PMH of a-fib, myasthenia gravis, bilat hand paresthesias related to C-spine disease, severe glaucoma, HTN, and COPD, who presents 08/15/17 with SOB and recurrent respiratory illness since December.  He had lower extremity edema on exam at presentation concerning for HF exacerbation and had CT chest with findings c/w pneumonia.    PT Comments    Today's session focused on bed mobility and repositioning, as pt uncomfortable with c/o chronic lower back pain. Pt heavily reliant on use of bed rails for mobility, requiring minA for rolling and hip bridges; maxA to scoot up in bed due to pt's decreased shoulder flexion ROM and unable to reach top bed rail. Pt able to perform BLE therex learned with PT at ALF; encouraged this often. Pt hopeful for return to ALF today and requests to remain comfortable in bed.    Follow Up Recommendations  SNF     Equipment Recommendations  None recommended by PT    Recommendations for Other Services       Precautions / Restrictions Precautions Precautions: Fall Restrictions Weight Bearing Restrictions: No    Mobility  Bed Mobility Overal bed mobility: Needs Assistance Bed Mobility: Rolling Rolling: Min assist         General bed mobility comments: Pt with BLEs hanging off end of bed upon entering room. Heavy reliance on BUEs and bed rails for bed mobility; required maxA to scoot up secondary to not being able to reach top bed rail with limited shoulder ROM. MinA for rolling; able to use side rails well despite decreased grip strength  Transfers                    Ambulation/Gait                 Stairs             Wheelchair Mobility    Modified Rankin (Stroke Patients Only)       Balance                                             Cognition Arousal/Alertness: Awake/alert Behavior During Therapy: WFL for tasks assessed/performed Overall Cognitive Status: Within Functional Limits for tasks assessed                                 General Comments: Tangential with speech, but able to be redirected      Exercises Other Exercises Other Exercises: BLE strength <3/5, but pt able to perform movements well within limited ROM, including knee flexion, hip flexion/abd/add, short arc quads, ankle DF/PF    General Comments        Pertinent Vitals/Pain Pain Assessment: Faces Faces Pain Scale: Hurts little more Pain Location: Lower back Pain Descriptors / Indicators: Constant;Sore Pain Intervention(s): Monitored during session;Repositioned    Home Living                      Prior Function            PT Goals (current goals can now be found in the care plan section) Acute Rehab PT Goals Patient Stated Goal: Return to ALF today PT Goal  Formulation: With patient Time For Goal Achievement: 09/01/17 Potential to Achieve Goals: Fair Progress towards PT goals: Progressing toward goals    Frequency    Min 2X/week      PT Plan Current plan remains appropriate    Co-evaluation              AM-PAC PT "6 Clicks" Daily Activity  Outcome Measure  Difficulty turning over in bed (including adjusting bedclothes, sheets and blankets)?: Unable Difficulty moving from lying on back to sitting on the side of the bed? : Unable Difficulty sitting down on and standing up from a chair with arms (e.g., wheelchair, bedside commode, etc,.)?: Unable Help needed moving to and from a bed to chair (including a wheelchair)?: Total Help needed walking in hospital room?: Total Help needed climbing 3-5 steps with a railing? : Total 6 Click Score: 6    End of Session   Activity Tolerance: Patient limited by fatigue Patient left: in bed;with call bell/phone within reach Nurse Communication: Mobility  status;Need for lift equipment PT Visit Diagnosis: Other abnormalities of gait and mobility (R26.89);Muscle weakness (generalized) (M62.81);Repeated falls (R29.6);History of falling (Z91.81);Difficulty in walking, not elsewhere classified (R26.2);Other symptoms and signs involving the nervous system (R29.898)     Time: 1119-1140 PT Time Calculation (min) (ACUTE ONLY): 21 min  Charges:  $Therapeutic Activity: 8-22 mins                    G Codes:      Mabeline Caras, PT, DPT Acute Rehab Services  Pager: [place] 08/25/2017, 12:17 PM

## 2017-08-25 NOTE — Progress Notes (Signed)
Patient will discharge to [facility] Anticipated discharge date: 6/28 Family notified: pt dtr [facility] by Corey Harold- called at 2:15pm for next available  CSW signing off.  Jorge Ny, LCSW Clinical Social Worker [phone]

## 2017-08-25 NOTE — NC FL2 (Addendum)
[place] MEDICAID FL2 LEVEL OF CARE SCREENING TOOL     IDENTIFICATION  Patient Name: Travis Ross Birthdate: September 29, 1926 Sex: male Admission Date (Current Location): 08/15/2017  [facility] and [place] Number:  Herbalist and Address:  [facility] [place], [place] [address], [place], [place] [address]      Provider Number: [id number]  Attending Physician Name and Address:  Desiree Hane, MD  Relative Name and Phone Number:       Current Level of Care: Hospital Recommended Level of Care: [place] Prior Approval Number:    Date Approved/Denied:   PASRR Number: [id number]  Discharge Plan: Other (Comment)(ALF)    Current Diagnoses: Patient Active Problem List   Diagnosis Date Noted  . Acute respiratory failure with hypoxia ([place]) 08/15/2017  . Hyperlipidemia 08/15/2017  . COPD with chronic bronchitis and emphysema ([place]) 08/15/2017  . GERD (gastroesophageal reflux disease) 08/15/2017  . Hypothyroidism (acquired) 08/15/2017  . History of Cervical spinal cord compression (HCC) 08/15/2017  . Myasthenia gravis ([place]) 08/15/2017  . Acute on chronic systolic heart failure ([place]) 08/15/2017  . Acute hypokalemia 08/15/2017  . Decubitus ulcer of sacral region, stage 1 08/15/2017  . Pressure injury of sacral region, stage 2 02/06/2017  . Acute on chronic systolic (congestive) heart failure ([place]) 02/06/2017  . Community acquired pneumonia 02/04/2017  . A-fib ([place])   . Periprosthetic fracture around internal prosthetic right knee joint 12/15/2016  . Failed total knee arthroplasty ([place]) 12/15/2016  . Permanent atrial fibrillation ([place]) 12/05/2016  . Pre-operative cardiovascular examination 12/05/2016  . Venous stasis of both lower extremities 12/05/2016  . Essential hypertension     Orientation RESPIRATION BLADDER Height & Weight     Self, Time, Situation, Place  Normal Continent Weight: 188 lb (85.3 kg) Height:  6\' 1"  (185.4 cm)   BEHAVIORAL SYMPTOMS/MOOD NEUROLOGICAL BOWEL NUTRITION STATUS      Continent Diet(mechanical soft)  AMBULATORY STATUS COMMUNICATION OF NEEDS Skin   Total Care Verbally PU Stage and Appropriate Care PU Stage 1 Dressing: (on sacrum- foam dressing)   PU Stage 3 Dressing: (on leg foam dressing)                 Personal Care Assistance Level of Assistance  Bathing, Dressing, Feeding Bathing Assistance: Maximum assistance Feeding assistance: Limited assistance Dressing Assistance: Maximum assistance     Functional Limitations Info             SPECIAL CARE FACTORS FREQUENCY  PT (By licensed PT), OT (By licensed OT), Speech therapy     PT Frequency: 3/wk with home health OT Frequency: 3/wk with home health     Speech Therapy Frequency: 3/wk with home health      Contractures      Additional Factors Info  Code Status, Allergies, Psychotropic Code Status Info: DNR Allergies Info: NKA Psychotropic Info: zoloft   Isolation Precautions Info: none       Discharge Medications: Medication List    STOP taking these medications   clopidogrel 75 MG tablet Commonly known as:  PLAVIX   potassium chloride 20 MEQ/15ML (10%) Soln     TAKE these medications   acetaminophen 325 MG tablet Commonly known as:  TYLENOL Take 650 mg by mouth every 6 (six) hours as needed for moderate pain.   apixaban 5 MG Tabs tablet Commonly known as:  ELIQUIS Take 1 tablet (5 mg total) by mouth 2 (two) times daily.   aspirin EC 81 MG tablet Take 81  mg by mouth daily.   atorvastatin 10 MG tablet Commonly known as:  LIPITOR Take 10 mg by mouth every evening.   azaTHIOprine 50 MG tablet Commonly known as:  IMURAN Take 50 mg by mouth 2 (two) times daily.   benzonatate 100 MG capsule Commonly known as:  TESSALON Take 100 mg by mouth at bedtime.   COMBIVENT RESPIMAT 20-100 MCG/ACT Aers respimat Generic drug:  Ipratropium-Albuterol Inhale 2 puffs into the lungs 2 (two) times  daily.   docusate sodium 100 MG capsule Commonly known as:  COLACE Take 100 mg by mouth 2 (two) times daily.   DULERA 100-5 MCG/ACT Aero Generic drug:  mometasone-formoterol Take 2 puffs by mouth 2 (two) times daily.   Fiber 0.52 g Caps Take 0.52 g by mouth at bedtime.   fluticasone 50 MCG/ACT nasal spray Commonly known as:  FLONASE Place 1 spray into both nostrils daily.   furosemide 40 MG tablet Commonly known as:  LASIX Take 40 mg by mouth daily.   guaiFENesin 600 MG 12 hr tablet Commonly known as:  MUCINEX Take 600 mg by mouth 2 (two) times daily.   levothyroxine 200 MCG tablet Commonly known as:  SYNTHROID, LEVOTHROID Take 200 mcg by mouth daily.   losartan 25 MG tablet Commonly known as:  COZAAR Take 0.5 tablets (12.5 mg total) by mouth daily. What changed:  how much to take   magnesium hydroxide 400 MG/5ML suspension Commonly known as:  MILK OF MAGNESIA Take 30 mLs by mouth See admin instructions. every 24 hours as needed for constipation.   metoprolol succinate 50 MG 24 hr tablet Commonly known as:  TOPROL-XL Take 2 tablets (100 mg total) by mouth daily. What changed:  how much to take   oxyCODONE 30 MG 12 hr tablet Commonly known as:  OXYCONTIN Take 1 tablet (30 mg total) by mouth every 12 (twelve) hours.   pantoprazole 40 MG tablet Commonly known as:  PROTONIX Take 40 mg by mouth 2 (two) times daily.   polycarbophil 625 MG tablet Commonly known as:  FIBERCON Take 1 tablet (625 mg total) by mouth at bedtime.   predniSONE 10 MG tablet Commonly known as:  DELTASONE Take 1 tablet (10 mg total) by mouth daily with breakfast. What changed:  how much to take   senna 8.6 MG tablet Commonly known as:  SENOKOT Take 1 tablet by mouth every evening.   sertraline 100 MG tablet Commonly known as:  ZOLOFT Take 150 mg by mouth daily.   SIMBRINZA 1-0.2 % Susp Generic drug:  Brinzolamide-Brimonidine Place 1 drop into both eyes 2 (two)  times daily.   temazepam 30 MG capsule Commonly known as:  RESTORIL Take 30 mg by mouth at bedtime.   TRAVATAN Z 0.004 % Soln ophthalmic solution Generic drug:  Travoprost (BAK Free) Place 1 drop into both eyes at bedtime.      Relevant Imaging Results:  Relevant Lab Results:   Additional Information SS#: [id number]  Jorge Ny, LCSW

## 2017-08-25 NOTE — Progress Notes (Signed)
This RN reviewed patient discharge with the patient and he verbalized understanding about discharge instructions. He reports he had no further questions. Awaiting transport for patient to go back to assisted living facility.

## 2017-08-26 DIAGNOSIS — J181 Lobar pneumonia, unspecified organism: Secondary | ICD-10-CM | POA: Diagnosis not present

## 2017-08-26 DIAGNOSIS — J440 Chronic obstructive pulmonary disease with acute lower respiratory infection: Secondary | ICD-10-CM | POA: Diagnosis not present

## 2017-08-26 DIAGNOSIS — L02416 Cutaneous abscess of left lower limb: Secondary | ICD-10-CM | POA: Diagnosis not present

## 2017-08-26 DIAGNOSIS — M150 Primary generalized (osteo)arthritis: Secondary | ICD-10-CM | POA: Diagnosis not present

## 2017-08-26 DIAGNOSIS — G7000 Myasthenia gravis without (acute) exacerbation: Secondary | ICD-10-CM | POA: Diagnosis not present

## 2017-08-26 DIAGNOSIS — S51011D Laceration without foreign body of right elbow, subsequent encounter: Secondary | ICD-10-CM | POA: Diagnosis not present

## 2017-08-27 DIAGNOSIS — M9711XD Periprosthetic fracture around internal prosthetic right knee joint, subsequent encounter: Secondary | ICD-10-CM | POA: Diagnosis not present

## 2017-08-27 DIAGNOSIS — G8929 Other chronic pain: Secondary | ICD-10-CM | POA: Diagnosis not present

## 2017-08-27 DIAGNOSIS — Z96651 Presence of right artificial knee joint: Secondary | ICD-10-CM | POA: Diagnosis not present

## 2017-08-29 ENCOUNTER — Ambulatory Visit: Payer: Medicare HMO | Admitting: Internal Medicine

## 2017-08-29 ENCOUNTER — Ambulatory Visit (INDEPENDENT_AMBULATORY_CARE_PROVIDER_SITE_OTHER)
Admission: RE | Admit: 2017-08-29 | Discharge: 2017-08-29 | Disposition: A | Discharge status: Home / Self Care | Payer: Medicare HMO | Source: Ambulatory Visit | Attending: Internal Medicine | Admitting: Internal Medicine

## 2017-08-29 ENCOUNTER — Encounter: Payer: Self-pay | Admitting: Internal Medicine

## 2017-08-29 VITALS — BP 92/60 | HR 96 | Ht 73.0 in | Wt 180.0 lb

## 2017-08-29 DIAGNOSIS — J440 Chronic obstructive pulmonary disease with acute lower respiratory infection: Secondary | ICD-10-CM | POA: Diagnosis not present

## 2017-08-29 DIAGNOSIS — L02416 Cutaneous abscess of left lower limb: Secondary | ICD-10-CM | POA: Diagnosis not present

## 2017-08-29 DIAGNOSIS — R0602 Shortness of breath: Secondary | ICD-10-CM | POA: Diagnosis not present

## 2017-08-29 DIAGNOSIS — J9612 Chronic respiratory failure with hypercapnia: Secondary | ICD-10-CM | POA: Diagnosis not present

## 2017-08-29 DIAGNOSIS — I5023 Acute on chronic systolic (congestive) heart failure: Secondary | ICD-10-CM | POA: Diagnosis not present

## 2017-08-29 DIAGNOSIS — R05 Cough: Secondary | ICD-10-CM | POA: Diagnosis not present

## 2017-08-29 DIAGNOSIS — J690 Pneumonitis due to inhalation of food and vomit: Secondary | ICD-10-CM | POA: Diagnosis not present

## 2017-08-29 DIAGNOSIS — J181 Lobar pneumonia, unspecified organism: Secondary | ICD-10-CM | POA: Diagnosis not present

## 2017-08-29 DIAGNOSIS — M150 Primary generalized (osteo)arthritis: Secondary | ICD-10-CM | POA: Diagnosis not present

## 2017-08-29 DIAGNOSIS — G7000 Myasthenia gravis without (acute) exacerbation: Secondary | ICD-10-CM | POA: Diagnosis not present

## 2017-08-29 DIAGNOSIS — S51011D Laceration without foreign body of right elbow, subsequent encounter: Secondary | ICD-10-CM | POA: Diagnosis not present

## 2017-08-29 NOTE — Patient Instructions (Addendum)
Use the flutter valve as much as you can - you can't overuse it   Please remember to go to the  x-ray department downstairs in the basement  for your tests - we will call you with the results when they are available.     Please schedule a follow up visit in 3 months but call sooner if needed

## 2017-08-29 NOTE — Progress Notes (Signed)
Travis Ross, male    DOB: 1926-12-28, 84 y.o.   MRN: [id number]   Brief patient profile:  14 yowm never smoker from [facility] where worked for [employer]  R&D moved to [facility] Aug 2018 p Fx  R Femur > Dr Maureen Ralphs reconstructed it > still w/c bound and NHP level assisted living  With course complicated by recurrent HCAP likely asp related - carried dx of copd but not pfts on record    Admit date: 08/15/2017 Discharge date: 08/25/17   Admitted From:ALF Disposition:  ALF  Recommendations for Outpatient Follow-up:  1. Follow up with PCP in 1-2 weeks 2. New medications: Stopped plavix in favor of eliquis for anticoagulation for Afib. Toprol increased to 100 mg qd 3. Dysphagia 2 diet due to silent aspiration confirmed on modified barium swallow    Home Health:NO Equipment/Devices:none Discharge Diagnoses:  Active Hospital Problems   Diagnosis Date Noted  . Acute respiratory failure with hypoxia ([place]) 08/15/2017  . Hyperlipidemia 08/15/2017  . COPD with chronic bronchitis and emphysema ([place]) 08/15/2017  . GERD (gastroesophageal reflux disease) 08/15/2017  . Hypothyroidism (acquired) 08/15/2017  . History of Cervical spinal cord compression (HCC) 08/15/2017  . Myasthenia gravis ([place]) 08/15/2017  . Acute on chronic systolic heart failure ([place]) 08/15/2017  . Acute hypokalemia 08/15/2017  . Decubitus ulcer of sacral region, stage 1 08/15/2017   Discharge Condition: Stable  CODE STATUS:DNR Diet recommendation: Dysphagia 2 (fine chop)   Diet recommendations: Dysphagia 2 (fine chop);Thin liquid Liquids provided via: Cup;No straw Medication Administration: Crushed with puree Supervision: Staff to assist with self feeding;Full supervision/cueing for compensatory strategies Compensations: Slow rate;Small sips/bites;Multiple dry swallows after each bite/sip;Clear throat after each swallow;Effortful swallow;Chin tuck Postural Changes and/or Swallow Maneuvers: Seated upright 90  degrees;Upright 30-60 min after meal;Chin tuck                Oral Care Recommendations: Oral care QID(frequent oral care due to aspiration risk) Follow up Recommendations: Home health SLP SLP Visit Diagnosis: Dysphagia, oropharyngeal phase (R13.12) Plan: Continue with current plan of care          Vitals:   08/25/17 0030 08/25/17 0726  BP: 113/67 109/68  Pulse: (!) 48 79  Resp:  18  Temp: 98.5 F (36.9 C) 98.5 F (36.9 C)  SpO2: 96% 92%    History of present illness:   Travis Ross a 84 y.o.year old malewith medical history significant for myasthenia gravis (last criss many years ago), hypothyroidism, HLD, CHF, COPD and recurrent pneumonias over the past 6 monthswho presented on 6/18/2019with SOBand was found to haveacute on chronic respiratory failure of multifactorial etiology presumed related to aspiration pneumonia, COPD, and CHF exacerbation. Remaining hospital course addressed in problem based format below:   Hospital Course:  Principal Problem:   Acute respiratory failure with hypoxia (HCC) Active Problems:   Hyperlipidemia   COPD with chronic bronchitis and emphysema (HCC)   GERD (gastroesophageal reflux disease)   Hypothyroidism (acquired)   History of Cervical spinal cord compression (HCC)   Myasthenia gravis (HCC)   Acute on chronic systolic heart failure (HCC)   Acute hypokalemia   Decubitus ulcer of sacral region, stage 1  Acute respiratory failure, resolved. Multifactorial etiology.  Likely combined effect from CHF exacerbation, aspiration pneumonia, COPD exacerbation. Was able to wean off 3 L [place] to room air on day of discharge.  Palliative care discussion with patient and daughter during hospital stay by our palliative medicine team. Patient elects to be DNR. Would  like to still receive full scope of treatment understanding he is at risk for recurrent admissions given his dysphagia. Supportive care to continue with  incentive spirometry and flutter valve   Multifocal PNA, resolved. Demonsrated LLL and RLL and RUL pacities on CT imaging.   Completed 7 days IV cefepime. Briefly on vancomycin empirically on admission but no MRSA on blood cultures. This is 6th episode of pna ( previously treated with levaquin as an outpatient). Modified barium swallow was consistent with silent aspiration doing well on dysphagia 2 diet.. Patient's neuromuscular disease and poor functional capicity would be high risk for bronchoscopy and necessary intubation per pulmonology recommendations. Additionally, patient and daughter agreed if it was a malignancy he would not want chemotherapy, radiation or surgery. Will follow up as outpatient on 07/2//19 with pulmonology  Acute on chronic systolic CHF, resolved. Slightly improved EF from prior TTE. Volume overload improved with IV lasix during stay. Net negative 10 L during admission. Continue betablocker, oral Lasix 40 mg qdand losartan.   Dysphagia with silent aspiration. MBS confirms. Dysphagia 2 diet  Hypothyroidism, stable. TSH wnl. Continue synthroid 200 mcg  COPD, stable.  Continue home schedule inhalers/nebulizer treatments. Continue chronic prednisone 5 mg  Atrial Fibrillation, rate controlled.  Toprol increased home dose to  100 mg qd for better rate control.  Home Plavix was discontinued in favor of Eliquis during admission (CHADSVASC: 4) and will continue on discharge.    Myasthenia gravis, stable. Unclear how much his aspiration can be attributed to his MG, no signs of crisis during stay Continue imuran   Depression/Anxiety, stable, continue zoloft and restoril  Stage 1 decubitus ulcer of sacrum, POA. Stable. air mattress  HLD, stable. lipitor  GERD, stable, protonix  History of cervical spinal cord compression. Chronic paresthesias makes ADLs difficult       08/29/2017 post hosp ov/[name] re: HCAP/ likely asp  Chief Complaint  Patient presents with    . Pulmonary Consult    Referred by Dr. Fredderick Phenix. Pt has had PNA multiple times over the past 7 months. He was also found to have a pulmonary nodule. He has some cough in the am- prod with clear sputum.    Dyspnea:  Transfers make him sob as does talking / remains w/c bound  Cough: more with medication than food  Sleep: no 02 / slt angle some am congestion > slt yellow mucus despite abx completed about a week prior to OV   Not clear if combivent helps or not    No obvious day to day or daytime variability or assoc excess/ purulent sputum or mucus plugs or hemoptysis or cp or chest tightness, subjective wheeze or overt sinus or hb symptoms.   Most nights sleeps with head elevated  without nocturnal exacerbation  of respiratory  c/o's or need for noct saba. Also denies any obvious fluctuation of symptoms with weather or environmental changes or other aggravating or alleviating factors except as outlined above   No unusual exposure hx or h/o childhood pna/ asthma or knowledge of premature birth.  Current Allergies, Complete Past Medical History, Past Surgical History, Family History, and Social History were reviewed in [employer] record.  ROS  The following are not active complaints unless bolded Hoarseness, sore throat, dysphagia, dental problems, itching, sneezing,  nasal congestion or discharge of excess mucus or purulent secretions, ear ache,   fever, chills, sweats, unintended wt loss or wt gain, classically pleuritic or exertional cp,  Orthopnea is minima/   pnd  or arm/hand swelling  or leg swelling, presyncope, palpitations, abdominal pain, anorexia, nausea, vomiting, diarrhea  or change in bowel habits or change in bladder habits, change in stools or change in urine, dysuria, hematuria,  rash, arthralgias, visual complaints, headache, numbness, weakness or ataxia or problems with walking or coordination,  change in mood or  memory.          Past Medical History:   Diagnosis Date  . CAP (community acquired pneumonia) 02/04/2017  . COPD with chronic bronchitis and emphysema ([place])   . Essential hypertension   . Femur fracture, right ([place])    Related to a fall. Periprosthetic distal femur fracture (close to the right knee prosthesis).  > Initial plans were to treat with brace, however, has now progressed and may require surgery  . GERD (gastroesophageal reflux disease)   . Glaucoma   . History of Cervical spinal cord compression w/ residual hand parestheisa   . Hyperlipidemia   . Hypothyroidism (acquired)    On Levothyroxine  . Myasthenia gravis ([place])    Now (prior to recent femur fracture) was relegated to maneuvering himself around on a Rolling Walker (Rollator) using his feet & sitting on the walker.  Does not have arm strength to mover a wheelchair or transfer.;   . Osteoarthritis    Global osteoarthritis involving back, hips and knees as well as elbows.  . Paresthesia    bilateral upper extremities, very weak ; poor grip  (must use adaptable silverware );   . Permanent atrial fibrillation (HCC)    Long-standing (> 4 yrs). Initially evaluated with Stress Test & Echo - Pt reports that these studies were "normal"    Outpatient Medications Prior to Visit  Medication Sig Dispense Refill  . acetaminophen (TYLENOL) 325 MG tablet Take 650 mg by mouth every 6 (six) hours as needed for moderate pain.    Marland Kitchen apixaban (ELIQUIS) 5 MG TABS tablet Take 1 tablet (5 mg total) by mouth 2 (two) times daily. 60 tablet   . aspirin EC 81 MG tablet Take 81 mg by mouth daily.    Marland Kitchen atorvastatin (LIPITOR) 10 MG tablet Take 10 mg by mouth every evening.     Marland Kitchen azaTHIOprine (IMURAN) 50 MG tablet Take 50 mg by mouth 2 (two) times daily.    . benzonatate (TESSALON) 100 MG capsule Take 100 mg by mouth at bedtime.    . Brinzolamide-Brimonidine (SIMBRINZA) 1-0.2 % SUSP Place 1 drop into both eyes 2 (two) times daily.    Marland Kitchen docusate sodium (COLACE) 100 MG capsule Take 100 mg by  mouth 2 (two) times daily.    . DULERA 100-5 MCG/ACT AERO Take 2 puffs by mouth 2 (two) times daily.     . fluticasone (FLONASE) 50 MCG/ACT nasal spray Place 1 spray into both nostrils daily.    . furosemide (LASIX) 40 MG tablet Take 40 mg by mouth daily.    Marland Kitchen guaiFENesin (MUCINEX) 600 MG 12 hr tablet Take 600 mg by mouth 2 (two) times daily.    . Ipratropium-Albuterol (COMBIVENT RESPIMAT) 20-100 MCG/ACT AERS respimat Inhale 2 puffs into the lungs 2 (two) times daily.     Marland Kitchen levothyroxine (SYNTHROID, LEVOTHROID) 200 MCG tablet Take 200 mcg by mouth daily.     Marland Kitchen losartan (COZAAR) 25 MG tablet Take 0.5 tablets (12.5 mg total) by mouth daily. (Patient taking differently: Take 50 mg by mouth daily. )    . magnesium hydroxide (MILK OF MAGNESIA) 400 MG/5ML suspension Take 30 mLs by  mouth See admin instructions. every 24 hours as needed for constipation.    . metoprolol succinate (TOPROL-XL) 50 MG 24 hr tablet Take 2 tablets (100 mg total) by mouth daily.    Marland Kitchen oxyCODONE (OXYCONTIN) 30 MG 12 hr tablet Take 1 tablet (30 mg total) by mouth every 12 (twelve) hours for 5 days. 10 tablet 0  . pantoprazole (PROTONIX) 40 MG tablet Take 40 mg by mouth 2 (two) times daily.     . polycarbophil (FIBERCON) 625 MG tablet Take 1 tablet (625 mg total) by mouth at bedtime. 30 tablet 0  . predniSONE (DELTASONE) 10 MG tablet Take 1 tablet (10 mg total) by mouth daily with breakfast. (Patient taking differently: Take 5 mg by mouth daily with breakfast. ) 30 tablet 0  . Psyllium (FIBER) 0.52 g CAPS Take 0.52 g by mouth at bedtime.    . senna (SENOKOT) 8.6 MG tablet Take 1 tablet by mouth every evening.    . sertraline (ZOLOFT) 100 MG tablet Take 150 mg by mouth daily.     . temazepam (RESTORIL) 30 MG capsule Take 30 mg by mouth at bedtime.     . TRAVATAN Z 0.004 % SOLN ophthalmic solution Place 1 drop into both eyes at bedtime.      No facility-administered medications prior to visit.         Objective:     BP 92/60  (BP Location: Left Arm, Cuff Size: Normal)   Pulse 96   Ht 6\' 1"  (1.854 m)   Wt 180 lb (81.6 kg)   SpO2 95%   BMI 23.75 kg/m   SpO2: 95 %  RA  Frail but quit pleasant elderly wm nad w/c bound / minimally congested sounding cough   HEENT: nl dentition, turbinates bilaterally, and oropharynx. Nl external ear canals without cough reflex   NECK :  without JVD/Nodes/TM/ nl carotid upstrokes bilaterally   LUNGS: no acc muscle use,  Nl contour chest with coarse insp and exp rhonchi bilaterally   CV:  RRR  no s3 or murmur or increase in P2, and - 1+ ptting both LEs  ABD:  soft and nontender with nl inspiratory excursion in the supine position. No bruits or organomegaly appreciated, bowel sounds nl  MS:   ext warm without deformities, calf tenderness, cyanosis or clubbing No obvious joint restrictions   SKIN: warm and dry without lesions    NEURO:  alert, approp, nl sensorium with  Reduced hand grip strength, non-ambulatory     Labs   Reviewed 08/29/2017      Chemistry      Component Value Date/Time   NA 139 08/23/2017 0311   K 4.2 08/23/2017 0311   CL 95 (L) 08/23/2017 0311   CO2 36 (H) 08/23/2017 0311   BUN 18 08/23/2017 0311   CREATININE 1.00 08/23/2017 0311      Component Value Date/Time   CALCIUM 8.9 08/23/2017 0311   ALKPHOS 112 08/17/2017 0350   AST 25 08/17/2017 0350   ALT 15 (L) 08/17/2017 0350   BILITOT 1.0 08/17/2017 0350        Lab Results  Component Value Date   WBC 10.8 (H) 08/23/2017   HGB 10.6 (L) 08/23/2017   HCT 36.2 (L) 08/23/2017   MCV 90.5 08/23/2017   PLT 208 08/23/2017         Lab Results  Component Value Date   TSH 1.000 08/15/2017         Lab Results  Component Value Date  ESRSEDRATE 28 (H) 08/15/2017        BNP   08/22/17  = 437   CXR PA and Lateral:   08/29/2017 :    I personally reviewed images and agree with radiology impression as follows:    Somewhat improved aeration of the left lung base, but persistent bibasilar  linear atelectasis is noted        Assessment   No problem-specific Assessment & Plan notes found for this encounter.     Christinia Gully, MD 08/29/2017

## 2017-08-30 DIAGNOSIS — J181 Lobar pneumonia, unspecified organism: Secondary | ICD-10-CM | POA: Diagnosis not present

## 2017-08-30 DIAGNOSIS — G7000 Myasthenia gravis without (acute) exacerbation: Secondary | ICD-10-CM | POA: Diagnosis not present

## 2017-08-30 DIAGNOSIS — L02416 Cutaneous abscess of left lower limb: Secondary | ICD-10-CM | POA: Diagnosis not present

## 2017-08-30 DIAGNOSIS — S51011D Laceration without foreign body of right elbow, subsequent encounter: Secondary | ICD-10-CM | POA: Diagnosis not present

## 2017-08-30 DIAGNOSIS — L8990 Pressure ulcer of unspecified site, unspecified stage: Secondary | ICD-10-CM | POA: Diagnosis not present

## 2017-08-30 DIAGNOSIS — J440 Chronic obstructive pulmonary disease with acute lower respiratory infection: Secondary | ICD-10-CM | POA: Diagnosis not present

## 2017-08-30 DIAGNOSIS — R269 Unspecified abnormalities of gait and mobility: Secondary | ICD-10-CM | POA: Diagnosis not present

## 2017-08-30 DIAGNOSIS — M150 Primary generalized (osteo)arthritis: Secondary | ICD-10-CM | POA: Diagnosis not present

## 2017-08-30 DIAGNOSIS — G894 Chronic pain syndrome: Secondary | ICD-10-CM | POA: Diagnosis not present

## 2017-08-30 DIAGNOSIS — J449 Chronic obstructive pulmonary disease, unspecified: Secondary | ICD-10-CM | POA: Diagnosis not present

## 2017-08-30 NOTE — Progress Notes (Signed)
Spoke with pt's daughter [name] and notified of results per Dr. Melvyn Novas. She verbalized understanding and denied any questions.

## 2017-09-01 ENCOUNTER — Encounter: Payer: Self-pay | Admitting: Internal Medicine

## 2017-09-01 DIAGNOSIS — J9612 Chronic respiratory failure with hypercapnia: Secondary | ICD-10-CM | POA: Insufficient documentation

## 2017-09-01 DIAGNOSIS — S51011D Laceration without foreign body of right elbow, subsequent encounter: Secondary | ICD-10-CM | POA: Diagnosis not present

## 2017-09-01 DIAGNOSIS — J181 Lobar pneumonia, unspecified organism: Secondary | ICD-10-CM | POA: Diagnosis not present

## 2017-09-01 DIAGNOSIS — M150 Primary generalized (osteo)arthritis: Secondary | ICD-10-CM | POA: Diagnosis not present

## 2017-09-01 DIAGNOSIS — L02416 Cutaneous abscess of left lower limb: Secondary | ICD-10-CM | POA: Diagnosis not present

## 2017-09-01 DIAGNOSIS — J440 Chronic obstructive pulmonary disease with acute lower respiratory infection: Secondary | ICD-10-CM | POA: Diagnosis not present

## 2017-09-01 DIAGNOSIS — G7000 Myasthenia gravis without (acute) exacerbation: Secondary | ICD-10-CM | POA: Diagnosis not present

## 2017-09-01 NOTE — Assessment & Plan Note (Addendum)
HC03   08/29/2017  = 36   Likely multifactorial and partially related to chronic narc use but  not necessarily  A concern based on:  Though somewhat paradoxic, when the lung fails to clear C02 properly and pC02 rises the lung then becomes a more efficient scavenger of C02 allowing lower work of breathing and  better C02 clearance albeit at a higher serum pC02 level - this is why pts can look a lot better than their ABG's would suggest and why it's so difficult to prognosticate endstage dz.  It's also why I strongly rec DNI status (ventilating pts down to a nl pC02 adversely affects this compensatory mechanism)

## 2017-09-01 NOTE — Assessment & Plan Note (Signed)
Echo 08/16/17 with EF 40%, severe LA and RAE with PAS 54   Could very well be that   his chronic sob is all chf as he has never smoked and does not have copd or exam to support COPD dx > rx as per PCP

## 2017-09-01 NOTE — Assessment & Plan Note (Signed)
Changes have been Present on 1st cxr in our system from 02/04/17 and still quite severe in bases L > R c/w asp pneumonia recurrent but nothing active to suggest ongoing infection but note he is severly compromised due to underlying MG /immuran rx chronically (? If really needed here, defer to PCP re neurology input) and chronic / recurrent aspiration despite ST input/ DII diet.   Nothing to add to care at this point other than to caution special attention at meal times and use flutter valve as much as possible to help mobilize secretions. If deteriorates will need to make decision re goals of care between feeding vs PEG and note DNR status which is appropriate here.   F/u can be q 3 months, sooner if needed   I had an extended discussion with the patient reviewing all relevant studies completed to date and  lasting 25 minutes of a 40  minute post hospital/ transition of care office visit with pt not previously known to me     re  severe non-specific but potentially very serious refractory respiratory symptoms of uncertain and potentially multiple  etiologies.   Each maintenance medication was reviewed in detail including most importantly the difference between maintenance and prns and under what circumstances the prns are to be triggered using an action plan format that is not reflected in the computer generated alphabetically organized AVS.    Please see AVS for specific instructions unique to this office visit that I personally wrote and verbalized to the the pt in detail and then reviewed with pt  by my nurse highlighting any changes in therapy/plan of care  recommended at today's visit.

## 2017-09-05 DIAGNOSIS — S51011D Laceration without foreign body of right elbow, subsequent encounter: Secondary | ICD-10-CM | POA: Diagnosis not present

## 2017-09-05 DIAGNOSIS — G7000 Myasthenia gravis without (acute) exacerbation: Secondary | ICD-10-CM | POA: Diagnosis not present

## 2017-09-05 DIAGNOSIS — J440 Chronic obstructive pulmonary disease with acute lower respiratory infection: Secondary | ICD-10-CM | POA: Diagnosis not present

## 2017-09-05 DIAGNOSIS — L02416 Cutaneous abscess of left lower limb: Secondary | ICD-10-CM | POA: Diagnosis not present

## 2017-09-05 DIAGNOSIS — J181 Lobar pneumonia, unspecified organism: Secondary | ICD-10-CM | POA: Diagnosis not present

## 2017-09-05 DIAGNOSIS — M150 Primary generalized (osteo)arthritis: Secondary | ICD-10-CM | POA: Diagnosis not present

## 2017-09-07 DIAGNOSIS — J181 Lobar pneumonia, unspecified organism: Secondary | ICD-10-CM | POA: Diagnosis not present

## 2017-09-07 DIAGNOSIS — G7000 Myasthenia gravis without (acute) exacerbation: Secondary | ICD-10-CM | POA: Diagnosis not present

## 2017-09-07 DIAGNOSIS — L02416 Cutaneous abscess of left lower limb: Secondary | ICD-10-CM | POA: Diagnosis not present

## 2017-09-07 DIAGNOSIS — S51011D Laceration without foreign body of right elbow, subsequent encounter: Secondary | ICD-10-CM | POA: Diagnosis not present

## 2017-09-07 DIAGNOSIS — J440 Chronic obstructive pulmonary disease with acute lower respiratory infection: Secondary | ICD-10-CM | POA: Diagnosis not present

## 2017-09-07 DIAGNOSIS — M150 Primary generalized (osteo)arthritis: Secondary | ICD-10-CM | POA: Diagnosis not present

## 2017-09-08 DIAGNOSIS — M150 Primary generalized (osteo)arthritis: Secondary | ICD-10-CM | POA: Diagnosis not present

## 2017-09-08 DIAGNOSIS — S51011D Laceration without foreign body of right elbow, subsequent encounter: Secondary | ICD-10-CM | POA: Diagnosis not present

## 2017-09-08 DIAGNOSIS — J181 Lobar pneumonia, unspecified organism: Secondary | ICD-10-CM | POA: Diagnosis not present

## 2017-09-08 DIAGNOSIS — L02416 Cutaneous abscess of left lower limb: Secondary | ICD-10-CM | POA: Diagnosis not present

## 2017-09-08 DIAGNOSIS — G7000 Myasthenia gravis without (acute) exacerbation: Secondary | ICD-10-CM | POA: Diagnosis not present

## 2017-09-08 DIAGNOSIS — J440 Chronic obstructive pulmonary disease with acute lower respiratory infection: Secondary | ICD-10-CM | POA: Diagnosis not present

## 2017-09-12 DIAGNOSIS — L02416 Cutaneous abscess of left lower limb: Secondary | ICD-10-CM | POA: Diagnosis not present

## 2017-09-12 DIAGNOSIS — J440 Chronic obstructive pulmonary disease with acute lower respiratory infection: Secondary | ICD-10-CM | POA: Diagnosis not present

## 2017-09-12 DIAGNOSIS — J181 Lobar pneumonia, unspecified organism: Secondary | ICD-10-CM | POA: Diagnosis not present

## 2017-09-12 DIAGNOSIS — G7000 Myasthenia gravis without (acute) exacerbation: Secondary | ICD-10-CM | POA: Diagnosis not present

## 2017-09-12 DIAGNOSIS — S51011D Laceration without foreign body of right elbow, subsequent encounter: Secondary | ICD-10-CM | POA: Diagnosis not present

## 2017-09-12 DIAGNOSIS — M150 Primary generalized (osteo)arthritis: Secondary | ICD-10-CM | POA: Diagnosis not present

## 2017-09-14 DIAGNOSIS — L02416 Cutaneous abscess of left lower limb: Secondary | ICD-10-CM | POA: Diagnosis not present

## 2017-09-14 DIAGNOSIS — M150 Primary generalized (osteo)arthritis: Secondary | ICD-10-CM | POA: Diagnosis not present

## 2017-09-14 DIAGNOSIS — J440 Chronic obstructive pulmonary disease with acute lower respiratory infection: Secondary | ICD-10-CM | POA: Diagnosis not present

## 2017-09-14 DIAGNOSIS — S51011D Laceration without foreign body of right elbow, subsequent encounter: Secondary | ICD-10-CM | POA: Diagnosis not present

## 2017-09-14 DIAGNOSIS — J181 Lobar pneumonia, unspecified organism: Secondary | ICD-10-CM | POA: Diagnosis not present

## 2017-09-14 DIAGNOSIS — G7000 Myasthenia gravis without (acute) exacerbation: Secondary | ICD-10-CM | POA: Diagnosis not present

## 2017-09-19 DIAGNOSIS — M150 Primary generalized (osteo)arthritis: Secondary | ICD-10-CM | POA: Diagnosis not present

## 2017-09-19 DIAGNOSIS — S51011D Laceration without foreign body of right elbow, subsequent encounter: Secondary | ICD-10-CM | POA: Diagnosis not present

## 2017-09-19 DIAGNOSIS — G7000 Myasthenia gravis without (acute) exacerbation: Secondary | ICD-10-CM | POA: Diagnosis not present

## 2017-09-19 DIAGNOSIS — J181 Lobar pneumonia, unspecified organism: Secondary | ICD-10-CM | POA: Diagnosis not present

## 2017-09-19 DIAGNOSIS — J440 Chronic obstructive pulmonary disease with acute lower respiratory infection: Secondary | ICD-10-CM | POA: Diagnosis not present

## 2017-09-19 DIAGNOSIS — L02416 Cutaneous abscess of left lower limb: Secondary | ICD-10-CM | POA: Diagnosis not present

## 2017-09-20 DIAGNOSIS — J181 Lobar pneumonia, unspecified organism: Secondary | ICD-10-CM | POA: Diagnosis not present

## 2017-09-20 DIAGNOSIS — S51011D Laceration without foreign body of right elbow, subsequent encounter: Secondary | ICD-10-CM | POA: Diagnosis not present

## 2017-09-20 DIAGNOSIS — J440 Chronic obstructive pulmonary disease with acute lower respiratory infection: Secondary | ICD-10-CM | POA: Diagnosis not present

## 2017-09-20 DIAGNOSIS — G7000 Myasthenia gravis without (acute) exacerbation: Secondary | ICD-10-CM | POA: Diagnosis not present

## 2017-09-20 DIAGNOSIS — L02416 Cutaneous abscess of left lower limb: Secondary | ICD-10-CM | POA: Diagnosis not present

## 2017-09-20 DIAGNOSIS — M150 Primary generalized (osteo)arthritis: Secondary | ICD-10-CM | POA: Diagnosis not present

## 2017-09-21 DIAGNOSIS — L02416 Cutaneous abscess of left lower limb: Secondary | ICD-10-CM | POA: Diagnosis not present

## 2017-09-21 DIAGNOSIS — M150 Primary generalized (osteo)arthritis: Secondary | ICD-10-CM | POA: Diagnosis not present

## 2017-09-21 DIAGNOSIS — S51011D Laceration without foreign body of right elbow, subsequent encounter: Secondary | ICD-10-CM | POA: Diagnosis not present

## 2017-09-21 DIAGNOSIS — J181 Lobar pneumonia, unspecified organism: Secondary | ICD-10-CM | POA: Diagnosis not present

## 2017-09-21 DIAGNOSIS — J440 Chronic obstructive pulmonary disease with acute lower respiratory infection: Secondary | ICD-10-CM | POA: Diagnosis not present

## 2017-09-21 DIAGNOSIS — G7000 Myasthenia gravis without (acute) exacerbation: Secondary | ICD-10-CM | POA: Diagnosis not present

## 2017-09-25 DIAGNOSIS — J440 Chronic obstructive pulmonary disease with acute lower respiratory infection: Secondary | ICD-10-CM | POA: Diagnosis not present

## 2017-09-25 DIAGNOSIS — J181 Lobar pneumonia, unspecified organism: Secondary | ICD-10-CM | POA: Diagnosis not present

## 2017-09-25 DIAGNOSIS — M150 Primary generalized (osteo)arthritis: Secondary | ICD-10-CM | POA: Diagnosis not present

## 2017-09-25 DIAGNOSIS — S51011D Laceration without foreign body of right elbow, subsequent encounter: Secondary | ICD-10-CM | POA: Diagnosis not present

## 2017-09-25 DIAGNOSIS — L02416 Cutaneous abscess of left lower limb: Secondary | ICD-10-CM | POA: Diagnosis not present

## 2017-09-25 DIAGNOSIS — G7000 Myasthenia gravis without (acute) exacerbation: Secondary | ICD-10-CM | POA: Diagnosis not present

## 2017-09-26 DIAGNOSIS — J440 Chronic obstructive pulmonary disease with acute lower respiratory infection: Secondary | ICD-10-CM | POA: Diagnosis not present

## 2017-09-26 DIAGNOSIS — J181 Lobar pneumonia, unspecified organism: Secondary | ICD-10-CM | POA: Diagnosis not present

## 2017-09-26 DIAGNOSIS — M150 Primary generalized (osteo)arthritis: Secondary | ICD-10-CM | POA: Diagnosis not present

## 2017-09-26 DIAGNOSIS — L02416 Cutaneous abscess of left lower limb: Secondary | ICD-10-CM | POA: Diagnosis not present

## 2017-09-26 DIAGNOSIS — S51011D Laceration without foreign body of right elbow, subsequent encounter: Secondary | ICD-10-CM | POA: Diagnosis not present

## 2017-09-26 DIAGNOSIS — G7000 Myasthenia gravis without (acute) exacerbation: Secondary | ICD-10-CM | POA: Diagnosis not present

## 2017-09-27 DIAGNOSIS — J440 Chronic obstructive pulmonary disease with acute lower respiratory infection: Secondary | ICD-10-CM | POA: Diagnosis not present

## 2017-09-27 DIAGNOSIS — S51011D Laceration without foreign body of right elbow, subsequent encounter: Secondary | ICD-10-CM | POA: Diagnosis not present

## 2017-09-27 DIAGNOSIS — J181 Lobar pneumonia, unspecified organism: Secondary | ICD-10-CM | POA: Diagnosis not present

## 2017-09-27 DIAGNOSIS — G7000 Myasthenia gravis without (acute) exacerbation: Secondary | ICD-10-CM | POA: Diagnosis not present

## 2017-09-27 DIAGNOSIS — L02416 Cutaneous abscess of left lower limb: Secondary | ICD-10-CM | POA: Diagnosis not present

## 2017-09-27 DIAGNOSIS — M150 Primary generalized (osteo)arthritis: Secondary | ICD-10-CM | POA: Diagnosis not present

## 2017-09-29 DIAGNOSIS — L02416 Cutaneous abscess of left lower limb: Secondary | ICD-10-CM | POA: Diagnosis not present

## 2017-09-29 DIAGNOSIS — J181 Lobar pneumonia, unspecified organism: Secondary | ICD-10-CM | POA: Diagnosis not present

## 2017-09-29 DIAGNOSIS — J440 Chronic obstructive pulmonary disease with acute lower respiratory infection: Secondary | ICD-10-CM | POA: Diagnosis not present

## 2017-09-29 DIAGNOSIS — G7000 Myasthenia gravis without (acute) exacerbation: Secondary | ICD-10-CM | POA: Diagnosis not present

## 2017-09-29 DIAGNOSIS — M150 Primary generalized (osteo)arthritis: Secondary | ICD-10-CM | POA: Diagnosis not present

## 2017-09-29 DIAGNOSIS — S51011D Laceration without foreign body of right elbow, subsequent encounter: Secondary | ICD-10-CM | POA: Diagnosis not present

## 2017-10-02 DIAGNOSIS — S51011D Laceration without foreign body of right elbow, subsequent encounter: Secondary | ICD-10-CM | POA: Diagnosis not present

## 2017-10-02 DIAGNOSIS — M150 Primary generalized (osteo)arthritis: Secondary | ICD-10-CM | POA: Diagnosis not present

## 2017-10-02 DIAGNOSIS — G7000 Myasthenia gravis without (acute) exacerbation: Secondary | ICD-10-CM | POA: Diagnosis not present

## 2017-10-02 DIAGNOSIS — J440 Chronic obstructive pulmonary disease with acute lower respiratory infection: Secondary | ICD-10-CM | POA: Diagnosis not present

## 2017-10-02 DIAGNOSIS — L02416 Cutaneous abscess of left lower limb: Secondary | ICD-10-CM | POA: Diagnosis not present

## 2017-10-02 DIAGNOSIS — J181 Lobar pneumonia, unspecified organism: Secondary | ICD-10-CM | POA: Diagnosis not present

## 2017-10-03 DIAGNOSIS — C4490 Unspecified malignant neoplasm of skin, unspecified: Secondary | ICD-10-CM | POA: Diagnosis not present

## 2017-10-03 DIAGNOSIS — Z79899 Other long term (current) drug therapy: Secondary | ICD-10-CM | POA: Diagnosis not present

## 2017-10-03 DIAGNOSIS — G9520 Unspecified cord compression: Secondary | ICD-10-CM | POA: Diagnosis not present

## 2017-10-03 DIAGNOSIS — G8222 Paraplegia, incomplete: Secondary | ICD-10-CM | POA: Diagnosis not present

## 2017-10-03 DIAGNOSIS — L89151 Pressure ulcer of sacral region, stage 1: Secondary | ICD-10-CM | POA: Diagnosis not present

## 2017-10-09 DIAGNOSIS — M150 Primary generalized (osteo)arthritis: Secondary | ICD-10-CM | POA: Diagnosis not present

## 2017-10-09 DIAGNOSIS — G7000 Myasthenia gravis without (acute) exacerbation: Secondary | ICD-10-CM | POA: Diagnosis not present

## 2017-10-09 DIAGNOSIS — L02416 Cutaneous abscess of left lower limb: Secondary | ICD-10-CM | POA: Diagnosis not present

## 2017-10-09 DIAGNOSIS — J440 Chronic obstructive pulmonary disease with acute lower respiratory infection: Secondary | ICD-10-CM | POA: Diagnosis not present

## 2017-10-09 DIAGNOSIS — S51011D Laceration without foreign body of right elbow, subsequent encounter: Secondary | ICD-10-CM | POA: Diagnosis not present

## 2017-10-09 DIAGNOSIS — J181 Lobar pneumonia, unspecified organism: Secondary | ICD-10-CM | POA: Diagnosis not present

## 2017-10-17 DIAGNOSIS — H6122 Impacted cerumen, left ear: Secondary | ICD-10-CM | POA: Diagnosis not present

## 2017-10-17 DIAGNOSIS — I4891 Unspecified atrial fibrillation: Secondary | ICD-10-CM | POA: Diagnosis not present

## 2017-10-17 DIAGNOSIS — B029 Zoster without complications: Secondary | ICD-10-CM | POA: Diagnosis not present

## 2017-10-17 DIAGNOSIS — Z79899 Other long term (current) drug therapy: Secondary | ICD-10-CM | POA: Diagnosis not present

## 2017-10-17 DIAGNOSIS — C4490 Unspecified malignant neoplasm of skin, unspecified: Secondary | ICD-10-CM | POA: Diagnosis not present

## 2017-10-24 DIAGNOSIS — G8222 Paraplegia, incomplete: Secondary | ICD-10-CM | POA: Diagnosis not present

## 2017-10-24 DIAGNOSIS — G7001 Myasthenia gravis with (acute) exacerbation: Secondary | ICD-10-CM | POA: Diagnosis not present

## 2017-10-24 DIAGNOSIS — B029 Zoster without complications: Secondary | ICD-10-CM | POA: Diagnosis not present

## 2017-10-24 DIAGNOSIS — C4490 Unspecified malignant neoplasm of skin, unspecified: Secondary | ICD-10-CM | POA: Diagnosis not present

## 2017-10-24 DIAGNOSIS — Z79899 Other long term (current) drug therapy: Secondary | ICD-10-CM | POA: Diagnosis not present

## 2017-10-31 DIAGNOSIS — B029 Zoster without complications: Secondary | ICD-10-CM | POA: Diagnosis not present

## 2017-10-31 DIAGNOSIS — G8222 Paraplegia, incomplete: Secondary | ICD-10-CM | POA: Diagnosis not present

## 2017-10-31 DIAGNOSIS — H6042 Cholesteatoma of left external ear: Secondary | ICD-10-CM | POA: Diagnosis not present

## 2017-10-31 DIAGNOSIS — Z79899 Other long term (current) drug therapy: Secondary | ICD-10-CM | POA: Diagnosis not present

## 2017-10-31 DIAGNOSIS — H6120 Impacted cerumen, unspecified ear: Secondary | ICD-10-CM | POA: Diagnosis not present

## 2017-11-01 ENCOUNTER — Emergency Department (HOSPITAL_COMMUNITY): Payer: Medicare HMO

## 2017-11-01 ENCOUNTER — Other Ambulatory Visit: Payer: Self-pay

## 2017-11-01 ENCOUNTER — Inpatient Hospital Stay (HOSPITAL_COMMUNITY)
Admission: EM | Admit: 2017-11-01 | Discharge: 2017-11-06 | DRG: 177 | Disposition: A | Discharge status: Skilled Nursing Facility | Payer: Medicare HMO | Source: Skilled Nursing Facility | Attending: Family Medicine | Admitting: Family Medicine

## 2017-11-01 ENCOUNTER — Encounter (HOSPITAL_COMMUNITY): Payer: Self-pay | Admitting: Emergency Medicine

## 2017-11-01 DIAGNOSIS — R0902 Hypoxemia: Secondary | ICD-10-CM

## 2017-11-01 DIAGNOSIS — S14103S Unspecified injury at C3 level of cervical spinal cord, sequela: Secondary | ICD-10-CM | POA: Diagnosis not present

## 2017-11-01 DIAGNOSIS — B029 Zoster without complications: Secondary | ICD-10-CM | POA: Diagnosis present

## 2017-11-01 DIAGNOSIS — J9621 Acute and chronic respiratory failure with hypoxia: Secondary | ICD-10-CM | POA: Diagnosis present

## 2017-11-01 DIAGNOSIS — G7000 Myasthenia gravis without (acute) exacerbation: Secondary | ICD-10-CM | POA: Diagnosis present

## 2017-11-01 DIAGNOSIS — I480 Paroxysmal atrial fibrillation: Secondary | ICD-10-CM | POA: Diagnosis not present

## 2017-11-01 DIAGNOSIS — Z736 Limitation of activities due to disability: Secondary | ICD-10-CM | POA: Diagnosis not present

## 2017-11-01 DIAGNOSIS — E039 Hypothyroidism, unspecified: Secondary | ICD-10-CM | POA: Diagnosis present

## 2017-11-01 DIAGNOSIS — K219 Gastro-esophageal reflux disease without esophagitis: Secondary | ICD-10-CM | POA: Diagnosis not present

## 2017-11-01 DIAGNOSIS — I10 Essential (primary) hypertension: Secondary | ICD-10-CM | POA: Diagnosis present

## 2017-11-01 DIAGNOSIS — Z96653 Presence of artificial knee joint, bilateral: Secondary | ICD-10-CM | POA: Diagnosis present

## 2017-11-01 DIAGNOSIS — Z66 Do not resuscitate: Secondary | ICD-10-CM | POA: Diagnosis present

## 2017-11-01 DIAGNOSIS — I5022 Chronic systolic (congestive) heart failure: Secondary | ICD-10-CM | POA: Diagnosis not present

## 2017-11-01 DIAGNOSIS — R1310 Dysphagia, unspecified: Secondary | ICD-10-CM | POA: Diagnosis present

## 2017-11-01 DIAGNOSIS — M479 Spondylosis, unspecified: Secondary | ICD-10-CM | POA: Diagnosis present

## 2017-11-01 DIAGNOSIS — J9612 Chronic respiratory failure with hypercapnia: Secondary | ICD-10-CM | POA: Diagnosis present

## 2017-11-01 DIAGNOSIS — J9622 Acute and chronic respiratory failure with hypercapnia: Secondary | ICD-10-CM | POA: Diagnosis present

## 2017-11-01 DIAGNOSIS — J189 Pneumonia, unspecified organism: Secondary | ICD-10-CM | POA: Diagnosis present

## 2017-11-01 DIAGNOSIS — M19021 Primary osteoarthritis, right elbow: Secondary | ICD-10-CM | POA: Diagnosis present

## 2017-11-01 DIAGNOSIS — E785 Hyperlipidemia, unspecified: Secondary | ICD-10-CM | POA: Diagnosis not present

## 2017-11-01 DIAGNOSIS — I110 Hypertensive heart disease with heart failure: Secondary | ICD-10-CM | POA: Diagnosis not present

## 2017-11-01 DIAGNOSIS — Z8261 Family history of arthritis: Secondary | ICD-10-CM | POA: Diagnosis not present

## 2017-11-01 DIAGNOSIS — R41841 Cognitive communication deficit: Secondary | ICD-10-CM | POA: Diagnosis not present

## 2017-11-01 DIAGNOSIS — R918 Other nonspecific abnormal finding of lung field: Secondary | ICD-10-CM | POA: Diagnosis not present

## 2017-11-01 DIAGNOSIS — R498 Other voice and resonance disorders: Secondary | ICD-10-CM | POA: Diagnosis not present

## 2017-11-01 DIAGNOSIS — M6281 Muscle weakness (generalized): Secondary | ICD-10-CM | POA: Diagnosis not present

## 2017-11-01 DIAGNOSIS — J449 Chronic obstructive pulmonary disease, unspecified: Secondary | ICD-10-CM | POA: Diagnosis not present

## 2017-11-01 DIAGNOSIS — Z209 Contact with and (suspected) exposure to unspecified communicable disease: Secondary | ICD-10-CM | POA: Diagnosis not present

## 2017-11-01 DIAGNOSIS — H409 Unspecified glaucoma: Secondary | ICD-10-CM | POA: Diagnosis not present

## 2017-11-01 DIAGNOSIS — Z7901 Long term (current) use of anticoagulants: Secondary | ICD-10-CM | POA: Diagnosis not present

## 2017-11-01 DIAGNOSIS — M19022 Primary osteoarthritis, left elbow: Secondary | ICD-10-CM | POA: Diagnosis present

## 2017-11-01 DIAGNOSIS — I4891 Unspecified atrial fibrillation: Secondary | ICD-10-CM | POA: Diagnosis not present

## 2017-11-01 DIAGNOSIS — Z8249 Family history of ischemic heart disease and other diseases of the circulatory system: Secondary | ICD-10-CM

## 2017-11-01 DIAGNOSIS — R069 Unspecified abnormalities of breathing: Secondary | ICD-10-CM | POA: Diagnosis not present

## 2017-11-01 DIAGNOSIS — L8990 Pressure ulcer of unspecified site, unspecified stage: Secondary | ICD-10-CM

## 2017-11-01 DIAGNOSIS — E876 Hypokalemia: Secondary | ICD-10-CM | POA: Diagnosis present

## 2017-11-01 DIAGNOSIS — Z79890 Hormone replacement therapy (postmenopausal): Secondary | ICD-10-CM

## 2017-11-01 DIAGNOSIS — J690 Pneumonitis due to inhalation of food and vomit: Principal | ICD-10-CM | POA: Diagnosis present

## 2017-11-01 DIAGNOSIS — Z9049 Acquired absence of other specified parts of digestive tract: Secondary | ICD-10-CM

## 2017-11-01 DIAGNOSIS — R062 Wheezing: Secondary | ICD-10-CM | POA: Diagnosis not present

## 2017-11-01 DIAGNOSIS — J9601 Acute respiratory failure with hypoxia: Secondary | ICD-10-CM | POA: Diagnosis present

## 2017-11-01 DIAGNOSIS — I482 Chronic atrial fibrillation: Secondary | ICD-10-CM | POA: Diagnosis not present

## 2017-11-01 DIAGNOSIS — G894 Chronic pain syndrome: Secondary | ICD-10-CM | POA: Diagnosis not present

## 2017-11-01 DIAGNOSIS — R1312 Dysphagia, oropharyngeal phase: Secondary | ICD-10-CM | POA: Diagnosis not present

## 2017-11-01 DIAGNOSIS — M2550 Pain in unspecified joint: Secondary | ICD-10-CM | POA: Diagnosis not present

## 2017-11-01 DIAGNOSIS — Z7982 Long term (current) use of aspirin: Secondary | ICD-10-CM

## 2017-11-01 DIAGNOSIS — T17908A Unspecified foreign body in respiratory tract, part unspecified causing other injury, initial encounter: Secondary | ICD-10-CM

## 2017-11-01 DIAGNOSIS — I4821 Permanent atrial fibrillation: Secondary | ICD-10-CM | POA: Diagnosis present

## 2017-11-01 DIAGNOSIS — R0602 Shortness of breath: Secondary | ICD-10-CM | POA: Diagnosis not present

## 2017-11-01 DIAGNOSIS — M160 Bilateral primary osteoarthritis of hip: Secondary | ICD-10-CM | POA: Diagnosis present

## 2017-11-01 DIAGNOSIS — Z7401 Bed confinement status: Secondary | ICD-10-CM | POA: Diagnosis not present

## 2017-11-01 DIAGNOSIS — Z7951 Long term (current) use of inhaled steroids: Secondary | ICD-10-CM

## 2017-11-01 LAB — COMPREHENSIVE METABOLIC PANEL
ALK PHOS: 102 U/L (ref 38–126)
ALT: 12 U/L (ref 0–44)
ANION GAP: 10 (ref 5–15)
AST: 17 U/L (ref 15–41)
Albumin: 2.7 g/dL — ABNORMAL LOW (ref 3.5–5.0)
BILIRUBIN TOTAL: 1.2 mg/dL (ref 0.3–1.2)
BUN: 25 mg/dL — ABNORMAL HIGH (ref 8–23)
CO2: 25 mmol/L (ref 22–32)
Calcium: 8.2 mg/dL — ABNORMAL LOW (ref 8.9–10.3)
Chloride: 107 mmol/L (ref 98–111)
Creatinine, Ser: 0.79 mg/dL (ref 0.61–1.24)
Glucose, Bld: 147 mg/dL — ABNORMAL HIGH (ref 70–99)
Potassium: 3 mmol/L — ABNORMAL LOW (ref 3.5–5.1)
SODIUM: 142 mmol/L (ref 135–145)
TOTAL PROTEIN: 5.6 g/dL — AB (ref 6.5–8.1)

## 2017-11-01 LAB — CBC WITH DIFFERENTIAL/PLATELET
Basophils Absolute: 0 10*3/uL (ref 0.0–0.1)
Basophils Relative: 0 %
Eosinophils Absolute: 0 10*3/uL (ref 0.0–0.7)
Eosinophils Relative: 0 %
HCT: 32.6 % — ABNORMAL LOW (ref 39.0–52.0)
HEMOGLOBIN: 10 g/dL — AB (ref 13.0–17.0)
LYMPHS ABS: 0.2 10*3/uL — AB (ref 0.7–4.0)
Lymphocytes Relative: 2 %
MCH: 26.5 pg (ref 26.0–34.0)
MCHC: 30.7 g/dL (ref 30.0–36.0)
MCV: 86.2 fL (ref 78.0–100.0)
MONOS PCT: 5 %
Monocytes Absolute: 0.5 10*3/uL (ref 0.1–1.0)
NEUTROS PCT: 93 %
Neutro Abs: 9.8 10*3/uL — ABNORMAL HIGH (ref 1.7–7.7)
Platelets: 171 10*3/uL (ref 150–400)
RBC: 3.78 MIL/uL — ABNORMAL LOW (ref 4.22–5.81)
RDW: 18.3 % — ABNORMAL HIGH (ref 11.5–15.5)
WBC: 10.6 10*3/uL — ABNORMAL HIGH (ref 4.0–10.5)

## 2017-11-01 LAB — MRSA PCR SCREENING: MRSA BY PCR: NEGATIVE

## 2017-11-01 LAB — BRAIN NATRIURETIC PEPTIDE: B NATRIURETIC PEPTIDE 5: 335 pg/mL — AB (ref 0.0–100.0)

## 2017-11-01 LAB — TROPONIN I: TROPONIN I: 0.04 ng/mL — AB (ref <0.03)

## 2017-11-01 MED ORDER — GUAIFENESIN 100 MG/5ML PO SOLN: 15.0000 mL | ORAL | Status: DC | PRN

## 2017-11-01 MED ORDER — GUAIFENESIN ER 600 MG PO TB12
600.0000 mg | ORAL_TABLET | Freq: Two times a day (BID) | ORAL | Status: DC
  Administered 2017-11-01 – 2017-11-06 (×10): 600 mg via ORAL
  Filled 2017-11-01 (×10): qty 1

## 2017-11-01 MED ORDER — FUROSEMIDE 40 MG PO TABS
40.0000 mg | ORAL_TABLET | Freq: Every day | ORAL | Status: DC
  Administered 2017-11-02 – 2017-11-06 (×5): 40 mg via ORAL
  Filled 2017-11-01 (×5): qty 1

## 2017-11-01 MED ORDER — ONDANSETRON HCL 4 MG/2ML IJ SOLN: 4.0000 mg | Freq: Four times a day (QID) | INTRAMUSCULAR | Status: DC | PRN

## 2017-11-01 MED ORDER — PANTOPRAZOLE SODIUM 40 MG PO TBEC
40.0000 mg | DELAYED_RELEASE_TABLET | Freq: Two times a day (BID) | ORAL | Status: DC
  Administered 2017-11-01 – 2017-11-06 (×10): 40 mg via ORAL
  Filled 2017-11-01 (×10): qty 1

## 2017-11-01 MED ORDER — SENNA 8.6 MG PO TABS
1.0000 | ORAL_TABLET | Freq: Every day | ORAL | Status: DC
  Administered 2017-11-01 – 2017-11-05 (×5): 8.6 mg via ORAL
  Filled 2017-11-01 (×5): qty 1

## 2017-11-01 MED ORDER — FIBER 0.52 G PO CAPS: 0.5200 g | ORAL_CAPSULE | Freq: Every day | ORAL | Status: DC

## 2017-11-01 MED ORDER — MAGNESIUM HYDROXIDE 400 MG/5ML PO SUSP: 30.0000 mL | Freq: Every day | ORAL | Status: DC | PRN

## 2017-11-01 MED ORDER — FLORANEX PO PACK
1.0000 g | PACK | Freq: Three times a day (TID) | ORAL | Status: DC
  Administered 2017-11-01 – 2017-11-06 (×14): 1 g via ORAL
  Filled 2017-11-01 (×17): qty 1

## 2017-11-01 MED ORDER — IPRATROPIUM-ALBUTEROL 20-100 MCG/ACT IN AERS: 2.0000 | INHALATION_SPRAY | Freq: Two times a day (BID) | RESPIRATORY_TRACT | Status: DC

## 2017-11-01 MED ORDER — SODIUM CHLORIDE 0.9% FLUSH
3.0000 mL | Freq: Two times a day (BID) | INTRAVENOUS | Status: DC
  Administered 2017-11-03 – 2017-11-06 (×4): 3 mL via INTRAVENOUS

## 2017-11-01 MED ORDER — SODIUM CHLORIDE 0.9% FLUSH: 3.0000 mL | INTRAVENOUS | Status: DC | PRN

## 2017-11-01 MED ORDER — LEVOTHYROXINE SODIUM 100 MCG PO TABS
200.0000 ug | ORAL_TABLET | Freq: Every day | ORAL | Status: DC
  Administered 2017-11-02 – 2017-11-06 (×5): 200 ug via ORAL
  Filled 2017-11-01 (×2): qty 1
  Filled 2017-11-01 (×3): qty 2
  Filled 2017-11-01: qty 1
  Filled 2017-11-01 (×2): qty 2

## 2017-11-01 MED ORDER — ACETAMINOPHEN 325 MG PO TABS: 650.0000 mg | ORAL_TABLET | Freq: Four times a day (QID) | ORAL | Status: DC | PRN

## 2017-11-01 MED ORDER — AZATHIOPRINE 50 MG PO TABS
50.0000 mg | ORAL_TABLET | Freq: Two times a day (BID) | ORAL | Status: DC
  Administered 2017-11-01 – 2017-11-06 (×10): 50 mg via ORAL
  Filled 2017-11-01 (×11): qty 1

## 2017-11-01 MED ORDER — ONDANSETRON HCL 4 MG PO TABS: 4.0000 mg | ORAL_TABLET | Freq: Four times a day (QID) | ORAL | Status: DC | PRN

## 2017-11-01 MED ORDER — ATORVASTATIN CALCIUM 10 MG PO TABS
10.0000 mg | ORAL_TABLET | Freq: Every evening | ORAL | Status: DC
  Administered 2017-11-01 – 2017-11-05 (×4): 10 mg via ORAL
  Filled 2017-11-01 (×4): qty 1

## 2017-11-01 MED ORDER — FLUTICASONE PROPIONATE 50 MCG/ACT NA SUSP
1.0000 | Freq: Every day | NASAL | Status: DC
  Administered 2017-11-02 – 2017-11-06 (×5): 1 via NASAL
  Filled 2017-11-01: qty 16

## 2017-11-01 MED ORDER — SERTRALINE HCL 50 MG PO TABS
150.0000 mg | ORAL_TABLET | Freq: Every day | ORAL | Status: DC
  Administered 2017-11-02 – 2017-11-06 (×5): 150 mg via ORAL
  Filled 2017-11-01 (×3): qty 3
  Filled 2017-11-01 (×2): qty 1

## 2017-11-01 MED ORDER — ASPIRIN EC 81 MG PO TBEC: 81.0000 mg | DELAYED_RELEASE_TABLET | Freq: Every day | ORAL | Status: DC

## 2017-11-01 MED ORDER — PREGABALIN 25 MG PO CAPS
25.0000 mg | ORAL_CAPSULE | Freq: Every day | ORAL | Status: DC
  Administered 2017-11-02 – 2017-11-06 (×5): 25 mg via ORAL
  Filled 2017-11-01 (×5): qty 1

## 2017-11-01 MED ORDER — LOSARTAN POTASSIUM 25 MG PO TABS
12.5000 mg | ORAL_TABLET | Freq: Every day | ORAL | Status: DC
  Administered 2017-11-02 – 2017-11-06 (×5): 12.5 mg via ORAL
  Filled 2017-11-01 (×5): qty 1

## 2017-11-01 MED ORDER — ACETAMINOPHEN 325 MG PO TABS
650.0000 mg | ORAL_TABLET | Freq: Four times a day (QID) | ORAL | Status: DC | PRN
  Administered 2017-11-03: 650 mg via ORAL
  Filled 2017-11-01: qty 2

## 2017-11-01 MED ORDER — IPRATROPIUM-ALBUTEROL 0.5-2.5 (3) MG/3ML IN SOLN
3.0000 mL | Freq: Four times a day (QID) | RESPIRATORY_TRACT | Status: DC
  Administered 2017-11-01 – 2017-11-04 (×11): 3 mL via RESPIRATORY_TRACT
  Filled 2017-11-01 (×11): qty 3

## 2017-11-01 MED ORDER — ACETAMINOPHEN 650 MG RE SUPP: 650.0000 mg | Freq: Four times a day (QID) | RECTAL | Status: DC | PRN

## 2017-11-01 MED ORDER — TRAZODONE HCL 50 MG PO TABS: 50.0000 mg | ORAL_TABLET | Freq: Every evening | ORAL | Status: DC | PRN

## 2017-11-01 MED ORDER — CALCIUM POLYCARBOPHIL 625 MG PO TABS
625.0000 mg | ORAL_TABLET | Freq: Every day | ORAL | Status: DC
  Administered 2017-11-01 – 2017-11-05 (×5): 625 mg via ORAL
  Filled 2017-11-01 (×6): qty 1

## 2017-11-01 MED ORDER — DOCUSATE SODIUM 100 MG PO CAPS
100.0000 mg | ORAL_CAPSULE | Freq: Two times a day (BID) | ORAL | Status: DC
  Administered 2017-11-01 – 2017-11-06 (×9): 100 mg via ORAL
  Filled 2017-11-01 (×9): qty 1

## 2017-11-01 MED ORDER — PIPERACILLIN-TAZOBACTAM 3.375 G IVPB
3.3750 g | Freq: Three times a day (TID) | INTRAVENOUS | Status: DC
  Administered 2017-11-01 – 2017-11-05 (×11): 3.375 g via INTRAVENOUS
  Filled 2017-11-01 (×11): qty 50

## 2017-11-01 MED ORDER — PIPERACILLIN-TAZOBACTAM 3.375 G IVPB 30 MIN
3.3750 g | Freq: Once | INTRAVENOUS | Status: AC
  Administered 2017-11-01: 3.375 g via INTRAVENOUS
  Filled 2017-11-01: qty 50

## 2017-11-01 MED ORDER — MORPHINE SULFATE ER 15 MG PO TBCR
15.0000 mg | EXTENDED_RELEASE_TABLET | Freq: Two times a day (BID) | ORAL | Status: DC
  Administered 2017-11-01 – 2017-11-06 (×10): 15 mg via ORAL
  Filled 2017-11-01 (×10): qty 1

## 2017-11-01 MED ORDER — VANCOMYCIN HCL 10 G IV SOLR
1500.0000 mg | Freq: Once | INTRAVENOUS | Status: AC
  Administered 2017-11-01: 1500 mg via INTRAVENOUS
  Filled 2017-11-01: qty 1500

## 2017-11-01 MED ORDER — ALBUTEROL SULFATE (2.5 MG/3ML) 0.083% IN NEBU: 2.5000 mg | INHALATION_SOLUTION | RESPIRATORY_TRACT | Status: DC | PRN

## 2017-11-01 MED ORDER — SODIUM CHLORIDE 0.9 % IV SOLN: 250.0000 mL | INTRAVENOUS | Status: DC | PRN

## 2017-11-01 MED ORDER — BENZONATATE 100 MG PO CAPS
100.0000 mg | ORAL_CAPSULE | Freq: Every day | ORAL | Status: DC
  Administered 2017-11-01 – 2017-11-05 (×5): 100 mg via ORAL
  Filled 2017-11-01 (×5): qty 1

## 2017-11-01 MED ORDER — TEMAZEPAM 15 MG PO CAPS
30.0000 mg | ORAL_CAPSULE | Freq: Every day | ORAL | Status: DC
  Administered 2017-11-01 – 2017-11-05 (×5): 30 mg via ORAL
  Filled 2017-11-01 (×5): qty 2

## 2017-11-01 MED ORDER — SODIUM CHLORIDE 0.9 % IV SOLN: INTRAVENOUS | Status: DC

## 2017-11-01 MED ORDER — SODIUM CHLORIDE 0.9 % IV SOLN
INTRAVENOUS | Status: DC
  Administered 2017-11-03: 09:00:00 via INTRAVENOUS

## 2017-11-01 MED ORDER — LACTATED RINGERS IV BOLUS
500.0000 mL | Freq: Once | INTRAVENOUS | Status: AC
  Administered 2017-11-01: 500 mL via INTRAVENOUS

## 2017-11-01 MED ORDER — METOPROLOL SUCCINATE ER 100 MG PO TB24
100.0000 mg | ORAL_TABLET | Freq: Every day | ORAL | Status: DC
  Administered 2017-11-02 – 2017-11-06 (×5): 100 mg via ORAL
  Filled 2017-11-01 (×5): qty 1

## 2017-11-01 MED ORDER — POLYETHYLENE GLYCOL 3350 17 G PO PACK
17.0000 g | PACK | Freq: Every day | ORAL | Status: DC | PRN
  Administered 2017-11-01: 17 g via ORAL
  Filled 2017-11-01: qty 1

## 2017-11-01 MED ORDER — APIXABAN 5 MG PO TABS
5.0000 mg | ORAL_TABLET | Freq: Two times a day (BID) | ORAL | Status: DC
  Administered 2017-11-01 – 2017-11-06 (×10): 5 mg via ORAL
  Filled 2017-11-01 (×10): qty 1

## 2017-11-01 NOTE — ED Triage Notes (Signed)
Per EMS, pt from [facility] with SOB, wheezing throughout with weak congested cough since last night around 11pm. 88% on RA. Pressure injuries to sacral region, predominantly on right side. Received albuterol and 125mg  solumedrol en route.

## 2017-11-01 NOTE — Progress Notes (Signed)
Pharmacy Antibiotic Note  Travis Ross is a 84 y.o. male admitted on 11/01/2017 with aspiration PNA.  Pharmacy has been consulted for Zosyn dosing.  Plan: Zosyn 3.375g IV q8h (4 hour infusion).  Vancomycin 1500mg  x1 in ED  Weight: 170 lb (77.1 kg)  Temp (24hrs), Avg:97.8 F (36.6 C), Min:97.8 F (36.6 C), Max:97.8 F (36.6 C)  Recent Labs  Lab 11/01/17 1125  WBC 10.6*  CREATININE 0.79    Estimated Creatinine Clearance: 68.3 mL/min (by C-G formula based on SCr of 0.79 mg/dL).    No Known Allergies  Antimicrobials this admission: 9/4 Vancomycin x1 9/4 Zosyn >>   Dose adjustments this admission:  Microbiology results: MRSA PCR ordered  Thank you for allowing pharmacy to be a part of this patient's care.  Nyoka Cowden, Leyda Vanderwerf L 11/01/2017 1:32 PM

## 2017-11-01 NOTE — ED Notes (Signed)
Date and time results received: 11/01/17 12:13 PM  Test: Troponin 1 Critical Value:0.04  Name of Provider Notified: [place]  Orders Received? Or Actions Taken?: awaiting orders

## 2017-11-01 NOTE — ED Notes (Signed)
ED TO INPATIENT HANDOFF REPORT  Name/Age/Gender Travis Ross 84 y.o. male  Code Status    Code Status Orders  (From admission, onward)         Start     Ordered   11/01/17 1118  Do not attempt resuscitation/DNR  Continuous    Question Answer Comment  In the event of cardiac or respiratory ARREST Do not call a "code blue"   In the event of cardiac or respiratory ARREST Do not perform Intubation, CPR, defibrillation or ACLS   In the event of cardiac or respiratory ARREST Use medication by any route, position, wound care, and other measures to relive pain and suffering. May use oxygen, suction and manual treatment of airway obstruction as needed for comfort.      11/01/17 1117        Code Status History    Date Active Date Inactive Code Status Order ID Comments User Context   08/15/2017 1631 08/25/2017 1901 DNR [id number]  Samella Parr, NP ED   02/04/2017 0930 02/09/2017 0035 Partial Code [id number]  Alphonzo Grieve, MD ED   12/15/2016 1842 12/22/2016 1658 Full Code [id number]  Gaynelle Arabian, MD Inpatient    Advance Directive Documentation     Most Recent Value  Type of Advance Directive  Healthcare Power of Attorney, Living will  Pre-existing out of facility DNR order (yellow form or pink MOST form)  -  "MOST" Form in Place?  -      Home/SNF/Other Skilled nursing facility  Chief Complaint SOB  Level of Care/Admitting Diagnosis ED Disposition    ED Disposition Condition [place]: [place] [facility] [[id number]]  Level of Care: Telemetry [5]  Admit to tele based on following criteria: Other see comments  Comments: Acute Hypoxia  Diagnosis: Pneumonia [268341]  Admitting Physician: Morrison Old  Attending Physician: Morrison Old  Estimated length of stay: 3 - 4 days  Certification:: I certify this patient will need inpatient services for at least 2 midnights  Bed request comments: Tele  PT Class (Do Not  Modify): Inpatient [101]  PT Acc Code (Do Not Modify): Private [1]       Medical History Past Medical History:  Diagnosis Date  . CAP (community acquired pneumonia) 02/04/2017  . COPD with chronic bronchitis and emphysema ([place])   . Essential hypertension   . Femur fracture, right ([place])    Related to a fall. Periprosthetic distal femur fracture (close to the right knee prosthesis).  > Initial plans were to treat with brace, however, has now progressed and may require surgery  . GERD (gastroesophageal reflux disease)   . Glaucoma   . History of Cervical spinal cord compression w/ residual hand parestheisa   . Hyperlipidemia   . Hypothyroidism (acquired)    On Levothyroxine  . Myasthenia gravis ([place])    Now (prior to recent femur fracture) was relegated to maneuvering himself around on a Rolling Walker (Rollator) using his feet & sitting on the walker.  Does not have arm strength to mover a wheelchair or transfer.;   . Osteoarthritis    Global osteoarthritis involving back, hips and knees as well as elbows.  . Paresthesia    bilateral upper extremities, very weak ; poor grip  (must use adaptable silverware );   . Permanent atrial fibrillation (HCC)    Long-standing (> 4 yrs). Initially evaluated with Stress Test & Echo - Pt reports that these studies were "normal"    Allergies  No Known Allergies  IV Location/Drains/Wounds Patient Lines/Drains/Airways Status   Active Line/Drains/Airways    Name:   Placement date:   Placement time:   Site:   Days:   Peripheral IV 08/19/17 Right Forearm   08/19/17    0654    Forearm   74   Peripheral IV 11/01/17 Left Forearm   11/01/17    1013    Forearm   less than 1   External Urinary Catheter   08/17/17    0850    -   76   Incision (Closed) 12/15/16 Knee Right   12/15/16    1706     321   Pressure Injury 02/04/17 Stage II -  Partial thickness loss of dermis presenting as a shallow open ulcer with a red, pink wound bed without slough.   02/04/17     0646     270   Pressure Injury 08/15/17 Stage I -  Intact skin with non-blanchable redness of a localized area usually over a bony prominence.   08/15/17    1836     78   Pressure Injury 08/15/17 Stage III -  Full thickness tissue loss. Subcutaneous fat may be visible but bone, tendon or muscle are NOT exposed.   08/15/17    1837     78          Labs/Imaging Results for orders placed or performed during the hospital encounter of 11/01/17 (from the past 48 hour(s))  CBC with Differential     Status: Abnormal   Collection Time: 11/01/17 11:25 AM  Result Value Ref Range   WBC 10.6 (H) 4.0 - 10.5 K/uL   RBC 3.78 (L) 4.22 - 5.81 MIL/uL   Hemoglobin 10.0 (L) 13.0 - 17.0 g/dL   HCT 32.6 (L) 39.0 - 52.0 %   MCV 86.2 78.0 - 100.0 fL   MCH 26.5 26.0 - 34.0 pg   MCHC 30.7 30.0 - 36.0 g/dL   RDW 18.3 (H) 11.5 - 15.5 %   Platelets 171 150 - 400 K/uL   Neutrophils Relative % 93 %   Neutro Abs 9.8 (H) 1.7 - 7.7 K/uL   Lymphocytes Relative 2 %   Lymphs Abs 0.2 (L) 0.7 - 4.0 K/uL   Monocytes Relative 5 %   Monocytes Absolute 0.5 0.1 - 1.0 K/uL   Eosinophils Relative 0 %   Eosinophils Absolute 0.0 0.0 - 0.7 K/uL   Basophils Relative 0 %   Basophils Absolute 0.0 0.0 - 0.1 K/uL    Comment: Performed at [facility], [place] [address], [place], [place] [address]  Comprehensive metabolic panel     Status: Abnormal   Collection Time: 11/01/17 11:25 AM  Result Value Ref Range   Sodium 142 135 - 145 mmol/L   Potassium 3.0 (L) 3.5 - 5.1 mmol/L   Chloride 107 98 - 111 mmol/L   CO2 25 22 - 32 mmol/L   Glucose, Bld 147 (H) 70 - 99 mg/dL   BUN 25 (H) 8 - 23 mg/dL   Creatinine, Ser 0.79 0.61 - 1.24 mg/dL   Calcium 8.2 (L) 8.9 - 10.3 mg/dL   Total Protein 5.6 (L) 6.5 - 8.1 g/dL   Albumin 2.7 (L) 3.5 - 5.0 g/dL   AST 17 15 - 41 U/L   ALT 12 0 - 44 U/L   Alkaline Phosphatase 102 38 - 126 U/L   Total Bilirubin 1.2 0.3 - 1.2 mg/dL   GFR calc non Af Amer >  60 >60 mL/min   GFR calc Af  Amer >60 >60 mL/min    Comment: (NOTE) The eGFR has been calculated using the CKD EPI equation. This calculation has not been validated in all clinical situations. eGFR's persistently <60 mL/min signify possible Chronic Kidney Disease.    Anion gap 10 5 - 15    Comment: Performed at [facility], [place] [address], [place], [place] [address]  Brain natriuretic peptide     Status: Abnormal   Collection Time: 11/01/17 11:25 AM  Result Value Ref Range   B Natriuretic Peptide 335.0 (H) 0.0 - 100.0 pg/mL    Comment: Performed at [facility], [place] [address] [place], [place] [address]  Troponin I     Status: Abnormal   Collection Time: 11/01/17 11:25 AM  Result Value Ref Range   Troponin I 0.04 (HH) <0.03 ng/mL    Comment: CRITICAL RESULT CALLED TO, READ BACK BY AND VERIFIED WITH: [facility]. RN AT 1212 11/01/17 MULLINS,T Performed at [facility], [place] [address], [place],  [address]    Dg Chest Portable 1 View  Result Date: 11/01/2017 CLINICAL DATA:  Weakness.  Difficulty breathing. EXAM: PORTABLE CHEST 1 VIEW COMPARISON:  08/29/2017 FINDINGS: There are hazy infiltrates in the right mid and lower lung zones and at the left lung base. Cardiomegaly. Pulmonary vascularity is normal. No acute bone abnormality. IMPRESSION: Bilateral pulmonary infiltrates. This could represent pneumonia or pulmonary edema. However, the pulmonary vascularity is within normal limits. Electronically Signed   By: Lorriane Shire M.D.   On: 11/01/2017 12:26    Pending Labs Unresulted Labs (From admission, onward)    Start     Ordered   11/01/17 1243  MRSA PCR Screening  Once,   R     11/01/17 1242   Signed and Held  Basic metabolic panel  Tomorrow morning,   R     Signed and Held   Signed and Held  CBC  Tomorrow morning,   R     Signed and Held          Vitals/Pain Today's Vitals   11/01/17 1040 11/01/17 1046 11/01/17 1047 11/01/17 1254  BP:  102/63     Pulse: 64  96   Resp: (!) 23  (!) 24   Temp:  97.8 F (36.6 C)    TempSrc:  Oral    SpO2: (!) 86% (!) 88% 93%   Weight:    77.1 kg  PainSc:  3       Isolation Precautions No active isolations  Medications Medications  vancomycin (VANCOCIN) 1,500 mg in sodium chloride 0.9 % 500 mL IVPB (has no administration in time range)  piperacillin-tazobactam (ZOSYN) IVPB 3.375 g (3.375 g Intravenous New Bag/Given 11/01/17 1324)  piperacillin-tazobactam (ZOSYN) IVPB 3.375 g (has no administration in time range)  lactated ringers bolus 500 mL (0 mLs Intravenous Stopped 11/01/17 1316)    Mobility walks with person assist

## 2017-11-01 NOTE — ED Notes (Signed)
Bed: DT26 Expected date:  Expected time:  Means of arrival:  Comments: EMS/shob

## 2017-11-01 NOTE — ED Provider Notes (Signed)
Emergency Department Provider Note   I have reviewed the triage vital signs and the nursing notes.   HISTORY  Chief Complaint Shortness of Breath   HPI Travis Ross is a 84 y.o. male with multiple medical problems as documented below to include myasthenia gravis presents the emergency department today from the facility secondary to shortness of breath and cough.  Patient is not a great historian but it sounds like his cough started yesterday and he had episode of significant dyspnea last night.  Persisted throughout the night last night and today.  This was called brought here for further evaluation apparently was hypoxic on room air with them to 86%.  On 2 L but then it had improved.  He had a congested cough and tachypnea so brought here for further evaluation.  Patient denies fevers.  Does have lower extremity swelling but is unsure how long it been there.  States he was sick like this a few months ago with pneumonia. No other associated or modifying symptoms.    Past Medical History:  Diagnosis Date  . CAP (community acquired pneumonia) 02/04/2017  . COPD with chronic bronchitis and emphysema ([place])   . Essential hypertension   . Femur fracture, right ([place])    Related to a fall. Periprosthetic distal femur fracture (close to the right knee prosthesis).  > Initial plans were to treat with brace, however, has now progressed and may require surgery  . GERD (gastroesophageal reflux disease)   . Glaucoma   . History of Cervical spinal cord compression w/ residual hand parestheisa   . Hyperlipidemia   . Hypothyroidism (acquired)    On Levothyroxine  . Myasthenia gravis ([place])    Now (prior to recent femur fracture) was relegated to maneuvering himself around on a Rolling Walker (Rollator) using his feet & sitting on the walker.  Does not have arm strength to mover a wheelchair or transfer.;   . Osteoarthritis    Global osteoarthritis involving back, hips and knees as well as  elbows.  . Paresthesia    bilateral upper extremities, very weak ; poor grip  (must use adaptable silverware );   . Permanent atrial fibrillation (HCC)    Long-standing (> 4 yrs). Initially evaluated with Stress Test & Echo - Pt reports that these studies were "normal"    Patient Active Problem List   Diagnosis Date Noted  . Pneumonia 11/01/2017  . Chronic hypercapnic respiratory failure ([place]) 09/01/2017  . Aspiration pneumonia of both lungs (HCC)/ chronic/ recurrent 08/29/2017  . Acute respiratory failure with hypoxia ([place]) 08/15/2017  . Hyperlipidemia 08/15/2017  . COPD with chronic bronchitis and emphysema ([place]) 08/15/2017  . GERD (gastroesophageal reflux disease) 08/15/2017  . Hypothyroidism (acquired) 08/15/2017  . History of Cervical spinal cord compression (HCC) 08/15/2017  . Myasthenia gravis ([place]) 08/15/2017  . Acute on chronic systolic heart failure ([place]) 08/15/2017  . Acute hypokalemia 08/15/2017  . Decubitus ulcer of sacral region, stage 1 08/15/2017  . Pressure injury of sacral region, stage 2 02/06/2017  . Acute on chronic systolic (congestive) heart failure ([place]) 02/06/2017  . Community acquired pneumonia 02/04/2017  . A-fib ([place])   . Periprosthetic fracture around internal prosthetic right knee joint 12/15/2016  . Failed total knee arthroplasty ([place]) 12/15/2016  . Permanent atrial fibrillation ([place]) 12/05/2016  . Pre-operative cardiovascular examination 12/05/2016  . Venous stasis of both lower extremities 12/05/2016  . Essential hypertension     Past Surgical History:  Procedure Laterality Date  . BACK SURGERY  x 6  . CARPAL TUNNEL RELEASE    . CHOLECYSTECTOMY    . ELBOW SURGERY  08/2015   Total of 3 surgeries  . REPLACEMENT TOTAL KNEE Bilateral    Right TKA 2003, left TKA 2010    . TOTAL KNEE REVISION Right 12/15/2016   Procedure: RIGHT TOTAL KNEE REVISION;  Surgeon: Gaynelle Arabian, MD;  Location: [facility];  Service: Orthopedics;  Laterality: Right;     Current Outpatient Rx  . Order #: [id number] Class: No Print  . Order #: [id number] Class: Historical Med  . Order #: [id number] Class: Historical Med  . Order #: [id number] Class: Historical Med  . Order #: [id number] Class: Historical Med  . Order #: [id number] Class: Historical Med  . Order #: [id number] Class: Historical Med  . Order #: [id number] Class: Historical Med  . Order #: [id number] Class: Historical Med  . Order #: [id number] Class: Historical Med  . Order #: [id number] Class: Historical Med  . Order #: [id number] Class: Historical Med  . Order #: [id number] Class: Historical Med  . Order #: [id number] Class: Historical Med  . Order #: [id number] Class: No Print  . Order #: [id number] Class: No Print  . Order #: [id number] Class: Historical Med  . Order #: [id number] Class: Historical Med  . Order #: [id number] Class: No Print  . Order #: [id number] Class: Normal  . Order #: [id number] Class: Historical Med  . Order #: [id number] Class: Historical Med  . Order #: [id number] Class: Historical Med  . Order #: [id number] Class: Historical Med  . Order #: [id number] Class: Historical Med  . Order #: [id number] Class: Historical Med  . Order #: [id number] Class: Historical Med  . Order #: [id number] Class: Historical Med    Allergies Patient has no known allergies.  Family History  Problem Relation Age of Onset  . Other Mother        Polio  . Heart failure Father   . Arthritis Sister   . Arthritis Brother     Social History Social History   Tobacco Use  . Smoking status: Never Smoker  . Smokeless tobacco: Never Used  Substance Use Topics  . Alcohol use: No  . Drug use: No    Review of Systems  All other systems negative except as documented in the HPI. All pertinent positives and negatives as reviewed in the HPI. ____________________________________________   PHYSICAL EXAM:  VITAL SIGNS: ED Triage Vitals  Enc Vitals Group     BP 11/01/17 1040 102/63     Pulse Rate 11/01/17 1040 64     Resp  11/01/17 1040 (!) 23     Temp 11/01/17 1046 97.8 F (36.6 C)     Temp Source 11/01/17 1046 Oral     SpO2 11/01/17 1040 (!) 86 %    Constitutional: Alert and oriented. Well appearing and in no acute distress. Eyes: Conjunctivae are normal. PERRL. Left eye strabismus. Head: Atraumatic. Nose: No congestion/rhinnorhea. Mouth/Throat: Mucous membranes are moist.  Oropharynx non-erythematous. Neck: No stridor.  No meningeal signs.   Cardiovascular: tachycardic rate, irregular rhythm. Good peripheral circulation. Grossly normal heart sounds.   Respiratory: tachypneic respiratory effort.  No retractions. Lungs with rhonchi and upper congestion, fine crackles?. Gastrointestinal: Soft and nontender. No distention.  Musculoskeletal: No lower extremity tenderness but has L>R pitting edema. No gross deformities of extremities. Neurologic:  Not able to fully assess. Generalized weakness.  Skin:  Skin is warm, dry and intact. No rash noted.  ____________________________________________   LABS (all labs ordered are listed, but only abnormal results are displayed)  Labs Reviewed  CBC WITH DIFFERENTIAL/PLATELET - Abnormal; Notable for the  following components:      Result Value   WBC 10.6 (*)    RBC 3.78 (*)    Hemoglobin 10.0 (*)    HCT 32.6 (*)    RDW 18.3 (*)    Neutro Abs 9.8 (*)    Lymphs Abs 0.2 (*)    All other components within normal limits  COMPREHENSIVE METABOLIC PANEL - Abnormal; Notable for the following components:   Potassium 3.0 (*)    Glucose, Bld 147 (*)    BUN 25 (*)    Calcium 8.2 (*)    Total Protein 5.6 (*)    Albumin 2.7 (*)    All other components within normal limits  BRAIN NATRIURETIC PEPTIDE - Abnormal; Notable for the following components:   B Natriuretic Peptide 335.0 (*)    All other components within normal limits  TROPONIN I - Abnormal; Notable for the following components:   Troponin I 0.04 (*)    All other components within normal limits  MRSA PCR  SCREENING   ____________________________________________  EKG   EKG Interpretation  Date/Time:  Wednesday November 01 2017 11:41:31 EDT Ventricular Rate:  100 PR Interval:    QRS Duration: 99 QT Interval:  382 QTC Calculation: 493 R Axis:   9 Text Interpretation:  Atrial fibrillation Borderline T abnormalities, inferior leads Borderline prolonged QT interval Baseline wander in lead(s) V5 V6 No significant change since last tracing Confirmed by Merrily Pew [id number]) on 11/01/2017 1:51:13 PM       ____________________________________________  RADIOLOGY  Dg Chest Portable 1 View  Result Date: 11/01/2017 CLINICAL DATA:  Weakness.  Difficulty breathing. EXAM: PORTABLE CHEST 1 VIEW COMPARISON:  08/29/2017 FINDINGS: There are hazy infiltrates in the right mid and lower lung zones and at the left lung base. Cardiomegaly. Pulmonary vascularity is normal. No acute bone abnormality. IMPRESSION: Bilateral pulmonary infiltrates. This could represent pneumonia or pulmonary edema. However, the pulmonary vascularity is within normal limits. Electronically Signed   By: Lorriane Shire M.D.   On: 11/01/2017 12:26   ____________________________________________   PROCEDURES  Procedure(s) performed:   Procedures  CRITICAL CARE Performed by: Merrily Pew Total critical care time: 35 minutes Critical care time was exclusive of separately billable procedures and treating other patients. Critical care was necessary to treat or prevent imminent or life-threatening deterioration. Critical care was time spent personally by me on the following activities: development of treatment plan with patient and/or surrogate as well as nursing, discussions with consultants, evaluation of patient's response to treatment, examination of patient, obtaining history from patient or surrogate, ordering and performing treatments and interventions, ordering and review of laboratory studies, ordering and review of  radiographic studies, pulse oximetry and re-evaluation of patient's condition.  ____________________________________________   INITIAL IMPRESSION / ASSESSMENT AND PLAN / ED COURSE  MG with history of aspiration pneumonia here for shortness of breath, hypoxia and mild respiratory distress.  However secondary to his myasthenia gravis will confirm diagnosis of CHF versus pneumonia prior to treatment.  DNR.  Patient's work-up consistent with pneumonia.  Also could be a component of CHF however his lower extremity swelling is about at baseline per his daughter's report.  We will go lightly on fluids at this time.  After discussion with the pharmacist with his vancomycin and Zosyn which will cover for community acquired and aspiration pneumonia.  Discussed with hospitalist who will admit to telemetry.   Pertinent labs & imaging results that were available during my care of the patient were reviewed by me  and considered in my medical decision making (see chart for details).  ____________________________________________  FINAL CLINICAL IMPRESSION(S) / ED DIAGNOSES  Final diagnoses:  Hypoxia  Acute on chronic respiratory failure with hypoxia (HCC)    MEDICATIONS GIVEN DURING THIS VISIT:  Medications  vancomycin (VANCOCIN) 1,500 mg in sodium chloride 0.9 % 500 mL IVPB (has no administration in time range)  piperacillin-tazobactam (ZOSYN) IVPB 3.375 g (3.375 g Intravenous Transfusing/Transfer 11/01/17 1342)  piperacillin-tazobactam (ZOSYN) IVPB 3.375 g (has no administration in time range)  lactated ringers bolus 500 mL (0 mLs Intravenous Stopped 11/01/17 1316)     NEW OUTPATIENT MEDICATIONS STARTED DURING THIS VISIT:  New Prescriptions   No medications on file    Note:  This note was prepared with assistance of Dragon voice recognition software. Occasional wrong-word or sound-a-like substitutions may have occurred due to the inherent limitations of voice recognition software.   Merrily Pew, MD 11/01/17 1351

## 2017-11-01 NOTE — H&P (Signed)
Patient Demographics:    Travis Ross, is a 84 y.o. male  MRN: [id number]   DOB - 1926/05/17  Admit Date - 11/01/2017  Outpatient Primary MD for the patient is Travis Poll, MD   Assessment & Plan:    Principal Problem:   Pneumonia Active Problems:   Essential hypertension   Permanent atrial fibrillation (HCC)   A-fib ([place])   Acute respiratory failure with hypoxia ([place])   COPD with chronic bronchitis and emphysema ([place])   Myasthenia gravis ([place])   Chronic hypercapnic respiratory failure ([place])     1)Bil PNA--- suspect aspiration pneumonia in a patient with history of dysphagia and myasthenia gravis, treat empirically with IV Zosyn, supplemental oxygen, mucolytics and bronchodilators as ordered, patient and family declined speech evaluation.  Patient received IV vancomycin in the ED, hold off on further Vancomycin at this time.  MRSA PCR screen pending.  Patient lives in an assisted living facility  2)Myasthenia Gravis--per patient and family they do not believe that patient is having a flareup at this time, continue Imuran  3)Acute Hypoxic Respiratory Failure--secondary to #1 above, treat as above #1  4)Social/Ethics--- plan of care discussed with patient and his 2 daughters at bedside/Travis Ross HPOA, CODE STATUS reviewed, patient is a DNR/DNI  5)H/o PAFib--- stable, continue Eliquis 5 mg twice daily, after discussion with patient's daughter Travis Ross HPOA , we will  stop aspirin as there are no further indications for the aspirin at this time , Toprol 100 mg daily for rate control  6)HFrEF--- last known EF around 40%, okay to continue Lasix 40 mg daily, at this time clinically no significant CHF exacerbation, patient's BNP is 335 which is lower than his previous levels, patient's troponin is also  lower than previous level, I believe patient's hypoxia and respiratory difficulties most likely related to pneumonia as above #1, continue Toprol-XL 100 mg daily, continue losartan 12.5 mg daily  7)Dysphagia--- with recurrent episodes of aspiration in the patient with underlying myasthenia gravis and history of C3 spinal cord injury, patient previously had speech evaluation in June 2019, and apparently he did not do well with modified diet, at this time patient and family does not want further speech/swallow evaluation, patient does not want his diet modified,.  Patient and family understand the risk of aspiration but they are willing to accept this risk  With History of - Reviewed by me  Past Medical History:  Diagnosis Date  . CAP (community acquired pneumonia) 02/04/2017  . COPD with chronic bronchitis and emphysema ([place])   . Essential hypertension   . Femur fracture, right ([place])    Related to a fall. Periprosthetic distal femur fracture (close to the right knee prosthesis).  > Initial plans were to treat with brace, however, has now progressed and may require surgery  . GERD (gastroesophageal reflux disease)   . Glaucoma   . History of Cervical spinal cord compression w/ residual hand parestheisa   .  Hyperlipidemia   . Hypothyroidism (acquired)    On Levothyroxine  . Myasthenia gravis ([place])    Now (prior to recent femur fracture) was relegated to maneuvering himself around on a Rolling Walker (Rollator) using his feet & sitting on the walker.  Does not have arm strength to mover a wheelchair or transfer.;   . Osteoarthritis    Global osteoarthritis involving back, hips and knees as well as elbows.  . Paresthesia    bilateral upper extremities, very weak ; poor grip  (must use adaptable silverware );   . Permanent atrial fibrillation (HCC)    Long-standing (> 4 yrs). Initially evaluated with Stress Test & Echo - Pt reports that these studies were "normal"      Past Surgical History:    Procedure Laterality Date  . BACK SURGERY     x 6  . CARPAL TUNNEL RELEASE    . CHOLECYSTECTOMY    . ELBOW SURGERY  08/2015   Total of 3 surgeries  . REPLACEMENT TOTAL KNEE Bilateral    Right TKA 2003, left TKA 2010    . TOTAL KNEE REVISION Right 12/15/2016   Procedure: RIGHT TOTAL KNEE REVISION;  Surgeon: Travis Arabian, MD;  Location: [facility];  Service: Orthopedics;  Laterality: Right;      Chief Complaint  Patient presents with  . Shortness of Breath      HPI:    Travis Ross  is a 84 y.o. male   never smoker (lived in  [place] where worked for [employer]  R&D moved to [facility] Aug 2018),  p Fx  R Femur > Dr Travis Ross reconstructed it > still w/c bound and NHP level assisted living  With course complicated by recurrent presumed aspiration pneumonia... Presents from assisted living facility today with sudden onset of respiratory distress since to 10/31/2017.   Patient was seen by his PCP Dr. Fredderick Ross around lunchtime on 10/31/2017,  he was doing okay at that time, later on in the evening of 10/31/2017 patient developed a cough, but the evening patient was standing weight and became hypoxic.... Family noticed the patient was very very congested and had conversational dyspnea  Patient believes she has subjective fevers, no Rigors  In ED.... Patient is found to be tachypneic with conversational dyspnea, he is hypoxic requiring 2 to 3 L of oxygen, prior to this patient was not on oxygen, white count is borderline elevated at 10.6, BNP usually 335 which is lower than patient's previous level, chest x-ray with bilateral infiltrates favor pneumonia........ after discussion with patient and family members at bedside it appears patient has another episode of aspiration pneumonia.... Patient received vancomycin and Zosyn in the ED  No chest pains, no pleuritic symptoms, palpitations or dizziness    Review of systems:    In addition to the HPI above,   A full Review of  Systems was done, all  other systems reviewed are negative except as noted above in HPI , .    Social History:  Reviewed by me    Social History   Tobacco Use  . Smoking status: Never Smoker  . Smokeless tobacco: Never Used  Substance Use Topics  . Alcohol use: No     Family History :  Reviewed by me    Family History  Problem Relation Age of Onset  . Other Mother        Polio  . Heart failure Father   . Arthritis Sister   . Arthritis Brother  Home Medications:   Prior to Admission medications   Medication Sig Start Date End Date Taking? Authorizing Provider  apixaban (ELIQUIS) 5 MG TABS tablet Take 1 tablet (5 mg total) by mouth 2 (two) times daily. 08/25/17  Yes Oretha Milch D, MD  aspirin EC 81 MG tablet Take 81 mg by mouth daily.   Yes [provider]  atorvastatin (LIPITOR) 10 MG tablet Take 10 mg by mouth every evening.  10/28/16  Yes [provider]  azaTHIOprine (IMURAN) 50 MG tablet Take 50 mg by mouth 2 (two) times daily.   Yes [provider]  benzonatate (TESSALON) 100 MG capsule Take 100 mg by mouth at bedtime.   Yes [provider]  Brinzolamide-Brimonidine (SIMBRINZA) 1-0.2 % SUSP Place 1 drop into both eyes 2 (two) times daily.   Yes [provider]  docusate sodium (COLACE) 100 MG capsule Take 100 mg by mouth 2 (two) times daily.   Yes [provider]  DULERA 100-5 MCG/ACT AERO Take 2 puffs by mouth 2 (two) times daily.  10/10/16  Yes [provider]  fluticasone (FLONASE) 50 MCG/ACT nasal spray Place 1 spray into both nostrils daily.   Yes [provider]  furosemide (LASIX) 40 MG tablet Take 40 mg by mouth daily. 11/09/16  Yes [provider]  guaiFENesin (MUCINEX) 600 MG 12 hr tablet Take 600 mg by mouth 2 (two) times daily.   Yes [provider]  Ipratropium-Albuterol (COMBIVENT RESPIMAT) 20-100 MCG/ACT AERS respimat Inhale 2 puffs into the lungs 2 (two) times daily.    Yes [provider]  levothyroxine (SYNTHROID, LEVOTHROID) 200 MCG tablet Take 200 mcg by mouth daily.  10/31/16  Yes [provider]  lidocaine (LMX) 4 % cream Apply 1 application topically as needed. Apply to right side of face as needed for pain.   Yes [provider]  losartan (COZAAR) 25 MG tablet Take 0.5 tablets (12.5 mg total) by mouth daily. 02/08/17  Yes Katherine Roan, MD  metoprolol succinate (TOPROL-XL) 50 MG 24 hr tablet Take 2 tablets (100 mg total) by mouth daily. 08/25/17  Yes Oretha Milch D, MD  morphine (MS CONTIN) 15 MG 12 hr tablet Take 15 mg by mouth 2 (two) times daily. For pain 10/31/17  Yes [provider]  pantoprazole (PROTONIX) 40 MG tablet Take 40 mg by mouth 2 (two) times daily.  10/28/16  Yes [provider]  polycarbophil (FIBERCON) 625 MG tablet Take 1 tablet (625 mg total) by mouth at bedtime. 12/20/16  Yes Perkins, Alexzandrew L, PA-C  predniSONE (DELTASONE) 10 MG tablet Take 1 tablet (10 mg total) by mouth daily with breakfast. Patient taking differently: Take 5 mg by mouth daily with breakfast.  02/07/17  Yes Winfrey, Jenne Pane, MD  pregabalin (LYRICA) 25 MG capsule Take 25 mg by mouth daily. 10/25/17  Yes [provider]  Psyllium (FIBER) 0.52 g CAPS Take 0.52 g by mouth at bedtime.   Yes [provider]  senna (SENOKOT) 8.6 MG tablet Take 1 tablet by mouth every evening.   Yes [provider]  sertraline (ZOLOFT) 100 MG tablet Take 150 mg by mouth daily.  10/28/16  Yes [provider]  temazepam (RESTORIL) 30 MG capsule Take 30 mg by mouth at bedtime.  11/09/16  Yes [provider]  TRAVATAN Z 0.004 % SOLN ophthalmic solution Place 1 drop into both eyes at bedtime.  11/09/16  Yes [provider]  acetaminophen (TYLENOL) 325 MG  tablet Take 650 mg by mouth every 6 (six) hours as needed for moderate pain.    [provider]  magnesium hydroxide (MILK OF MAGNESIA) 400 MG/5ML  suspension Take 30 mLs by mouth See admin instructions. every 24 hours as needed for constipation.    [provider]     Allergies:    No Known Allergies   Physical Exam:   Vitals  Blood pressure (!) 109/57, pulse 97, temperature 97.8 F (36.6 C), temperature source Oral, resp. rate 20, height 6\' 2"  (1.88 m), weight 85.3 kg, SpO2 96 %.  Physical Examination: General appearance - alert, with conversational dyspnea and tachypnea Mental status - alert, oriented to person, place, and time,  Eyes - sclera anicteric , left eye with extraocular movement difficulties (not new) Neck - supple, no JVD elevation , Chest -patient sounds very very rhonchorous, few scattered wheezes, decreased air movement Heart - S1 and S2 normal, irregular Abdomen - soft, nontender, nondistended, no masses or organomegaly Neurological - screening mental status exam normal, at baseline patient is bedbound wheelchair-bound, but no new focal deficits when compared to baseline Extremities -1+ pitting edema on the right 1-2+ pitting edema on the left,  intact peripheral pulses  Skin - warm, dry, left lower extremity with abrasions, buttock area with pressure injury, right side of face and scalp with healing scabbed lesions     Data Review:    CBC Recent Labs  Lab 11/01/17 1125  WBC 10.6*  HGB 10.0*  HCT 32.6*  PLT 171  MCV 86.2  MCH 26.5  MCHC 30.7  RDW 18.3*  LYMPHSABS 0.2*  MONOABS 0.5  EOSABS 0.0  BASOSABS 0.0   ------------------------------------------------------------------------------------------------------------------  Chemistries  Recent Labs  Lab 11/01/17 1125  NA 142  K 3.0*  CL 107  CO2 25  GLUCOSE 147*  BUN 25*  CREATININE 0.79  CALCIUM 8.2*  AST 17  ALT 12  ALKPHOS 102  BILITOT 1.2   ------------------------------------------------------------------------------------------------------------------ estimated creatinine clearance is 72.8 mL/min (by C-G formula  based on SCr of 0.79 mg/dL). ------------------------------------------------------------------------------------------------------------------ No results for input(s): TSH, T4TOTAL, T3FREE, THYROIDAB in the last 72 hours.  Invalid input(s): FREET3   Coagulation profile No results for input(s): INR, PROTIME in the last 168 hours. ------------------------------------------------------------------------------------------------------------------- No results for input(s): DDIMER in the last 72 hours. -------------------------------------------------------------------------------------------------------------------  Cardiac Enzymes Recent Labs  Lab 11/01/17 1125  TROPONINI 0.04*   ------------------------------------------------------------------------------------------------------------------    Component Value Date/Time   BNP 335.0 (H) 11/01/2017 1125   ---------------------------------------------------------------------------------------------------------------  Urinalysis    Component Value Date/Time   COLORURINE YELLOW 08/16/2017 1038   APPEARANCEUR HAZY (A) 08/16/2017 1038   LABSPEC 1.010 08/16/2017 1038   PHURINE 5.0 08/16/2017 1038   GLUCOSEU NEGATIVE 08/16/2017 1038   HGBUR NEGATIVE 08/16/2017 1038   BILIRUBINUR NEGATIVE 08/16/2017 1038   [place] 08/16/2017 1038   PROTEINUR NEGATIVE 08/16/2017 1038   NITRITE NEGATIVE 08/16/2017 1038   LEUKOCYTESUR NEGATIVE 08/16/2017 1038   ----------------------------------------------------------------------------------------------------------------   Imaging Results:    Dg Chest Portable 1 View  Result Date: 11/01/2017 CLINICAL DATA:  Weakness.  Difficulty breathing. EXAM: PORTABLE CHEST 1 VIEW COMPARISON:  08/29/2017 FINDINGS: There are hazy infiltrates in the right mid and lower lung zones and at the left lung base. Cardiomegaly. Pulmonary vascularity is normal. No acute bone abnormality. IMPRESSION: Bilateral  pulmonary infiltrates. This could represent pneumonia or pulmonary edema. However, the pulmonary vascularity is within normal limits. Electronically Signed   By: Lorriane Shire M.D.   On: 11/01/2017 12:26  Radiological Exams on Admission: Dg Chest Portable 1 View  Result Date: 11/01/2017 CLINICAL DATA:  Weakness.  Difficulty breathing. EXAM: PORTABLE CHEST 1 VIEW COMPARISON:  08/29/2017 FINDINGS: There are hazy infiltrates in the right mid and lower lung zones and at the left lung base. Cardiomegaly. Pulmonary vascularity is normal. No acute bone abnormality. IMPRESSION: Bilateral pulmonary infiltrates. This could represent pneumonia or pulmonary edema. However, the pulmonary vascularity is within normal limits. Electronically Signed   By: Lorriane Shire M.D.   On: 11/01/2017 12:26    DVT Prophylaxis -SCD/Eliquis AM Labs Ordered, also please review Full Orders  Family Communication: Admission, patients condition and plan of care including tests being ordered have been discussed with the patient and 2 daughters at bedside who indicate understanding and agree with the plan   Code Status - Full Code  Likely DC to  Facility  Condition   stable Roxan Hockey M.D on 11/01/2017 at 2:43 PM Pager---[phone] Go to [email or web address] - password [email or web address] for contact info  [facility] - Office  [phone]

## 2017-11-01 NOTE — Progress Notes (Signed)
RT note: NIF obtained with great effort; (-46cmH20).

## 2017-11-02 DIAGNOSIS — I480 Paroxysmal atrial fibrillation: Secondary | ICD-10-CM

## 2017-11-02 DIAGNOSIS — G7000 Myasthenia gravis without (acute) exacerbation: Secondary | ICD-10-CM

## 2017-11-02 DIAGNOSIS — J9601 Acute respiratory failure with hypoxia: Secondary | ICD-10-CM

## 2017-11-02 DIAGNOSIS — J690 Pneumonitis due to inhalation of food and vomit: Principal | ICD-10-CM

## 2017-11-02 LAB — CBC
HCT: 33.1 % — ABNORMAL LOW (ref 39.0–52.0)
Hemoglobin: 10.1 g/dL — ABNORMAL LOW (ref 13.0–17.0)
MCH: 26.4 pg (ref 26.0–34.0)
MCHC: 30.5 g/dL (ref 30.0–36.0)
MCV: 86.4 fL (ref 78.0–100.0)
PLATELETS: 163 10*3/uL (ref 150–400)
RBC: 3.83 MIL/uL — ABNORMAL LOW (ref 4.22–5.81)
RDW: 18.2 % — AB (ref 11.5–15.5)
WBC: 10.9 10*3/uL — ABNORMAL HIGH (ref 4.0–10.5)

## 2017-11-02 LAB — BASIC METABOLIC PANEL
Anion gap: 13 (ref 5–15)
BUN: 26 mg/dL — AB (ref 8–23)
CO2: 26 mmol/L (ref 22–32)
CREATININE: 0.67 mg/dL (ref 0.61–1.24)
Calcium: 8.5 mg/dL — ABNORMAL LOW (ref 8.9–10.3)
Chloride: 103 mmol/L (ref 98–111)
GFR calc Af Amer: >60 mL/min (ref >60)
GFR calc non Af Amer: >60 mL/min (ref >60)
GLUCOSE: 124 mg/dL — AB (ref 70–99)
Potassium: 3.4 mmol/L — ABNORMAL LOW (ref 3.5–5.1)
Sodium: 142 mmol/L (ref 135–145)

## 2017-11-02 LAB — PROCALCITONIN: Procalcitonin: <0.1 ng/mL

## 2017-11-02 MED ORDER — POTASSIUM CHLORIDE CRYS ER 20 MEQ PO TBCR
40.0000 meq | EXTENDED_RELEASE_TABLET | Freq: Once | ORAL | Status: AC
  Administered 2017-11-02: 40 meq via ORAL
  Filled 2017-11-02: qty 2

## 2017-11-02 MED ORDER — POLYETHYLENE GLYCOL 3350 17 G PO PACK
17.0000 g | PACK | Freq: Every day | ORAL | Status: DC
  Administered 2017-11-02 – 2017-11-06 (×5): 17 g via ORAL
  Filled 2017-11-02 (×5): qty 1

## 2017-11-02 NOTE — Clinical Social Work Note (Signed)
Clinical Social Work Assessment  Patient Details  Name: Travis Ross MRN: [id number] Date of Birth: [birth year]  Date of referral:  11/02/17               Reason for consult:  Discharge Planning                Permission sought to share information with:  Facility Sport and exercise psychologist, Family Supports Permission granted to share information::  Yes, Verbal Permission Granted  Name::     Travis Ross  Agency::  [facility]  Relationship::  daughter  Contact Information:  [phone]  Housing/Transportation Living arrangements for the past 2 months:  Assisted Living Facility([facility]) Source of Information:  Patient, Facility, Adult Children Patient Interpreter Needed:  None Criminal Activity/Legal Involvement Pertinent to Current Situation/Hospitalization:  No - Comment as needed Significant Relationships:  Adult Children Lives with:  Facility Resident Do you feel safe going back to the place where you live?  Yes Need for family participation in patient care:  Yes (Comment)  Care giving concerns:  Patient from [facility]. Per staff member Travis Ross patient is wheelchair bound and uses a hoyer lift for transfers. Staff reported that patient has multiple medical issues. ALF Director of Nursing reported that they feel patient needs a higher level of care and reported that they do not feel that they can meet patient's care needs. CSW consulted to assist with discharge planning.   Social Worker assessment / plan:  CSW spoke with patient at bedside regarding discharge planning. Patient reported that he has at been at [facility] for a while and foresees the plan being to return. Patient granted CSW verbal permission to speak with his daughter regarding discharge planning further.  CSW spoke with Director of Nursing at [facility] who reported that they are no longer able to meet patient's care needs. She reported that patient needs Travis Ross term care at a  SNF.  CSW contacted patient's daughter and discussed patient's discharge planning. CSW updated patient's daughter that patient's current ALF feels that they are unable to meet patient's needs. Patient's daughter reported that she feels there motivation to get patient out of the ALF is different. Patient's daughter reported that she is agreeable to SNF for Travis Ross term care because she has no other choice. Patient's daughter reported that the patient will private pay for SNF. CSW agreed to complete patient's FL2 and follow up with SNF options.  CSW will complete FL2 and follow up with offers. CSW will continue to follow and assist with discharge planning.  Employment status:  Retired Nurse, adult PT Recommendations:  [place] / Referral to community resources:     Patient/Family's Response to care:  Patient/patient's daughter appreciative of CSW assistance with discharge planning.  Patient/Family's Understanding of and Emotional Response to Diagnosis, Current Treatment, and Prognosis:  Patient presented calm and verbalized understanding of current treatment plan. Patient spoke at length about his life, sharing stories about his wife and children. Patient's daughter involved in patient's care and verbalized understanding of current treatment plan. Patient's daughter verbalized plan for patient to discharge to SNF for Travis Ross term care.   Emotional Assessment Appearance:  Appears stated age Attitude/Demeanor/Rapport:  Engaged Affect (typically observed):  Appropriate Orientation:  Oriented to Self, Oriented to Situation, Oriented to Place, Oriented to  Time Alcohol / Substance use:  Not Applicable Psych involvement (Current and /or in the community):  No (Comment)  Discharge Needs  Concerns to  be addressed:  Care Coordination Readmission within the last 30 days:  No Current discharge risk:  Dependent with Mobility Barriers to Discharge:   Continued Medical Work up   [employer], LCSW 11/02/2017, 3:33 PM

## 2017-11-02 NOTE — Consult Note (Addendum)
[facility] Nurse wound consult note Reason for Consult:stage 2 on lower spinal protrusion and darkened blanchable area in gluteal fold. Pt has multiple areas of full thickness and partial thickness skin tears on right arm and weeping rashes on right side of face and right ear. Foam already on these areas. Wound type:pressure and MASD  Pressure Injury POA: Yes Measurement:sacral and intergluteal area is darkened but blanchable, foam has pressed buttocks together, adjusted to prevent intertriginous damage. The stage 2 on spinal process is 0.3cm x 0.5cm x 0.1cm Wound bed:100% pale pink Drainage (amount, consistency, odor) scant Periwound: intact around wound, MASD distally in gluteal fold Dressing procedure/placement/frequency: I repositioned the foam dressing to place it into the gluteal fold instead of pressing the buttocks together to further add to the intertriginous problem. Foam was already on stage 2 on spinal process. Educated pt on staying side to side, not to lie supine. Pt well educated and states he always tries to stay off of his back. His poor nutritional level is a detriment to wound healing, recommend Nutritional Consult or Supplements, please order if you agree.  We will not follow, but will remain available to this patient, to nursing, and the medical and/or surgical teams.  Please re-consult if we need to assist further.  Fara Olden, RN-C, WTA-C, [facility]ound Treatment Associate [facility] Care Associate

## 2017-11-02 NOTE — Care Management Note (Signed)
Case Management Note  Patient Details  Name: Travis Ross MRN: [id number] Date of Birth: October 24, 1926  Subjective/Objective:Admitted w/PNA. From [facility]. PT cons. Await recc. CSW/CM following.                    Action/Plan:d/c ALF   Expected Discharge Date:  (unknown)               Expected Discharge Plan:  Assisted Living / Rest Home  In-House Referral:  Clinical Social Work  Discharge planning Services  CM Consult  Post Acute Care Choice:    Choice offered to:     DME Arranged:    DME Agency:     HH Arranged:    [place] Agency:     Status of Service:  In process, will continue to follow  If discussed at Long Length of Stay Meetings, dates discussed:    Additional Comments:  Dessa Phi, RN 11/02/2017, 2:57 PM

## 2017-11-02 NOTE — Progress Notes (Signed)
NIF -30cm H2O

## 2017-11-02 NOTE — Progress Notes (Signed)
PROGRESS NOTE [facility]   Shirl Ludington   VHQ:[id number] DOB: 04/29/1926  DOA: 11/01/2017 PCP: [name], MD   Brief Narrative:  Travis Ross is a 84 year old male with medical history of recurrent aspiration pneumonia, myasthenia gravis, COPD, paroxysmal A. fib, HFrEF, dysphagia and C3 spinal cord injury.  Patient presented to the emergency department complaining of shortness of breath, congestion and hypoxic at the nursing home facility.  In the ED patient was found to be tachypneic with conversational dyspnea, hypoxia requiring 2 to 3 L of oxygen, prior to this patient was not on oxygen, mild elevated WBC and chest x-ray with bilateral infiltrates favoring pneumonia.  Patient was admitted with working diagnosis of aspiration pneumonia, started on empiric antibiotics.  Subjective: Patient seen and examined, today he is not concerned about his pneumonia however remains on oxygen supplementation at 2 L with intermittent desaturations.  He is concerned about his face, he has some scabbing and burning sensation that is chronic from cancer treatment  Assessment & Plan: Acute respiratory failure with hypoxia secondary to aspiration pneumonia Patient requiring 2 L of supplemental oxygen and still having some intermittent desaturations to the upper 80s.  Treat underlying causes.  Bilateral pneumonia Suspect aspiration pneumonia, patient with history of dysphagia and not compliant with mechanical soft diet.  Patient continues to eat a regular diet and thin liquids.  Patient had a swallow eval which showed moderate risk for aspiration and silent aspiration.  I have discussed with patient and family that as long as he continues to be noncompliant with diet he will continue to have recurrent aspiration and he is chances or survival will diminish with time.  Patient and family understand and they have documentation that these are the patient wishes and want to continue his normal regular diet.   We will continue to treat with antibiotic therapy continue IV Zosyn for now.  Patient initially treated with vancomycin however MRSA PCR negative.  Continue supportive treatment with bronchodilator, mucolytic and oxygen supplementation. Patient and family have declined speech evaluation. Negative procalcitonin Wean o2 as tolerated   Myasthenia gravis Does not seem an exacerbation at this time.  Continue Imuran  PAF Patient on Toprol 100 mg for rate control, patient is noncompliant with medications, heart rate slightly elevated today.  Continue Eliquis 5 mg twice a day.  Prior to admission was on aspirin which has been stopped.  Continue to monitor  HFrEF Last known EF about 40%, currently on Lasix 40 mg daily.  No signs of fluid overload.  Continue beta-blocker and ARB  Dysphagia Continue with recurrent episodes of aspiration, underlying myasthenia gravis and history of C3 spinal cord injury.  Patient declined speech evaluation and request regular diet.  Patient and family understand the risks but willing to accept this risk as respect for patient wishes.  DVT prophylaxis: Eliquis  Code Status: DNR Family Communication: Daughter via phone  Disposition Plan: Home in 2-3 day pending respiratory improvement   Consultants:   None   Procedures:   None   Antimicrobials: Anti-infectives (From admission, onward)   Start     Dose/Rate Route Frequency Ordered Stop   11/01/17 2200  piperacillin-tazobactam (ZOSYN) IVPB 3.375 g     3.375 g 12.5 mL/hr over 240 Minutes Intravenous Every 8 hours 11/01/17 1322     11/01/17 1245  vancomycin (VANCOCIN) 1,500 mg in sodium chloride 0.9 % 500 mL IVPB     1,500 mg 250 mL/hr over 120 Minutes Intravenous  Once 11/01/17 1242 11/01/17 1639  11/01/17 1245  piperacillin-tazobactam (ZOSYN) IVPB 3.375 g     3.375 g 100 mL/hr over 30 Minutes Intravenous  Once 11/01/17 1242 11/01/17 1354       Objective: Vitals:   11/01/17 2138 11/02/17 0517 11/02/17  0549 11/02/17 0740  BP:  123/87 137/84   Pulse:  65 (!) 102   Resp:  18 18   Temp:  (!) 97.2 F (36.2 C) 97.8 F (36.6 C)   TempSrc:  Oral Oral   SpO2: 97% 98% 95% 97%  Weight:      Height:        Intake/Output Summary (Last 24 hours) at 11/02/2017 1126 Last data filed at 11/02/2017 0600 Gross per 24 hour  Intake 1497.5 ml  Output 700 ml  Net 797.5 ml   Filed Weights   11/01/17 1254 11/01/17 1427  Weight: 77.1 kg 85.3 kg    Examination:  General exam: Appears calm and comfortable  HEENT: multiple scab lesions in the r fore head crossing midline. Erythema on the jaw with no swelling.  Respiratory system: Decrease BS, bibasilar rales more prominent on the RLL. No wheezing  Cardiovascular system: S1 & S2 heard, RRR. + systolic murmurs 2/6, rubs or gallops Gastrointestinal system: Abdomen is nondistended, soft and nontender.  Central nervous system: Alert and oriented.  Extremities: No pedal edema.  Skin: See above  Psychiatry: Mood & affect appropriate.   Data Reviewed: I have personally reviewed following labs and imaging studies  CBC: Recent Labs  Lab 11/01/17 1125 11/02/17 0619  WBC 10.6* 10.9*  NEUTROABS 9.8*  --   HGB 10.0* 10.1*  HCT 32.6* 33.1*  MCV 86.2 86.4  PLT 171 979   Basic Metabolic Panel: Recent Labs  Lab 11/01/17 1125 11/02/17 0619  NA 142 142  K 3.0* 3.4*  CL 107 103  CO2 25 26  GLUCOSE 147* 124*  BUN 25* 26*  CREATININE 0.79 0.67  CALCIUM 8.2* 8.5*   GFR: Estimated Creatinine Clearance: 72.8 mL/min (by C-G formula based on SCr of 0.67 mg/dL). Liver Function Tests: Recent Labs  Lab 11/01/17 1125  AST 17  ALT 12  ALKPHOS 102  BILITOT 1.2  PROT 5.6*  ALBUMIN 2.7*   No results for input(s): LIPASE, AMYLASE in the last 168 hours. No results for input(s): AMMONIA in the last 168 hours. Coagulation Profile: No results for input(s): INR, PROTIME in the last 168 hours. Cardiac Enzymes: Recent Labs  Lab 11/01/17 1125  TROPONINI  0.04*   BNP (last 3 results) No results for input(s): PROBNP in the last 8760 hours. HbA1C: No results for input(s): HGBA1C in the last 72 hours. CBG: No results for input(s): GLUCAP in the last 168 hours. Lipid Profile: No results for input(s): CHOL, HDL, LDLCALC, TRIG, CHOLHDL, LDLDIRECT in the last 72 hours. Thyroid Function Tests: No results for input(s): TSH, T4TOTAL, FREET4, T3FREE, THYROIDAB in the last 72 hours. Anemia Panel: No results for input(s): VITAMINB12, FOLATE, FERRITIN, TIBC, IRON, RETICCTPCT in the last 72 hours. Sepsis Labs: Recent Labs  Lab 11/02/17 0823  PROCALCITON <0.10    Recent Results (from the past 240 hour(s))  MRSA PCR Screening     Status: None   Collection Time: 11/01/17  3:08 PM  Result Value Ref Range Status   MRSA by PCR NEGATIVE NEGATIVE Final    Comment:        The GeneXpert MRSA Assay (FDA approved for NASAL specimens only), is one component of a comprehensive MRSA colonization surveillance program. It is  not intended to diagnose MRSA infection nor to guide or monitor treatment for MRSA infections. Performed at [facility], [place] [address], [place], [place] [address]       Radiology Studies: Dg Chest Portable 1 View  Result Date: 11/01/2017 CLINICAL DATA:  Weakness.  Difficulty breathing. EXAM: PORTABLE CHEST 1 VIEW COMPARISON:  08/29/2017 FINDINGS: There are hazy infiltrates in the right mid and lower lung zones and at the left lung base. Cardiomegaly. Pulmonary vascularity is normal. No acute bone abnormality. IMPRESSION: Bilateral pulmonary infiltrates. This could represent pneumonia or pulmonary edema. However, the pulmonary vascularity is within normal limits. Electronically Signed   By: Lorriane Shire M.D.   On: 11/01/2017 12:26    Scheduled Meds: . apixaban  5 mg Oral BID  . atorvastatin  10 mg Oral QPM  . azaTHIOprine  50 mg Oral BID  . benzonatate  100 mg Oral QHS  . docusate sodium  100 mg Oral BID    . fluticasone  1 spray Each Nare Daily  . furosemide  40 mg Oral Daily  . guaiFENesin  600 mg Oral BID  . ipratropium-albuterol  3 mL Nebulization QID  . lactobacillus  1 g Oral TID WC  . levothyroxine  200 mcg Oral QAC breakfast  . losartan  12.5 mg Oral Daily  . metoprolol succinate  100 mg Oral Daily  . morphine  15 mg Oral BID  . pantoprazole  40 mg Oral BID  . polycarbophil  625 mg Oral QHS  . polyethylene glycol  17 g Oral Daily  . pregabalin  25 mg Oral Daily  . senna  1 tablet Oral QHS  . sertraline  150 mg Oral Daily  . sodium chloride flush  3 mL Intravenous Q12H  . temazepam  30 mg Oral QHS   Continuous Infusions: . sodium chloride    . sodium chloride 10 mL/hr at 11/01/17 1509  . piperacillin-tazobactam (ZOSYN)  IV 3.375 g (11/02/17 0546)     LOS: 1 day    Time spent: Total of 35 minutes spent with pt, greater than 50% of which was spent in discussion of  treatment, counseling and coordination of care   Chipper Oman, MD Pager: Text Page via [email or web address]   If 7PM-7AM, please contact night-coverage [email or web address] 11/02/2017, 11:26 AM   Note - This record has been created using [employer]. Chart creation errors have been sought, but may not always have been located. Such creation errors do not reflect on the standard of medical care.

## 2017-11-02 NOTE — Progress Notes (Signed)
Pt. was able to achieve -32 cmh20 on NIF with good effort.

## 2017-11-03 DIAGNOSIS — I10 Essential (primary) hypertension: Secondary | ICD-10-CM

## 2017-11-03 LAB — BASIC METABOLIC PANEL
ANION GAP: 12 (ref 5–15)
BUN: 23 mg/dL (ref 8–23)
CALCIUM: 8.3 mg/dL — AB (ref 8.9–10.3)
CO2: 27 mmol/L (ref 22–32)
CREATININE: 0.69 mg/dL (ref 0.61–1.24)
Chloride: 104 mmol/L (ref 98–111)
GFR calc Af Amer: >60 mL/min (ref >60)
GLUCOSE: 95 mg/dL (ref 70–99)
Potassium: 2.8 mmol/L — ABNORMAL LOW (ref 3.5–5.1)
Sodium: 143 mmol/L (ref 135–145)

## 2017-11-03 LAB — CBC
HCT: 30.9 % — ABNORMAL LOW (ref 39.0–52.0)
Hemoglobin: 9.6 g/dL — ABNORMAL LOW (ref 13.0–17.0)
MCH: 27.1 pg (ref 26.0–34.0)
MCHC: 31.1 g/dL (ref 30.0–36.0)
MCV: 87.3 fL (ref 78.0–100.0)
Platelets: 162 10*3/uL (ref 150–400)
RBC: 3.54 MIL/uL — ABNORMAL LOW (ref 4.22–5.81)
RDW: 18.1 % — AB (ref 11.5–15.5)
WBC: 9.9 10*3/uL (ref 4.0–10.5)

## 2017-11-03 LAB — MAGNESIUM: Magnesium: 1.5 mg/dL — ABNORMAL LOW (ref 1.7–2.4)

## 2017-11-03 MED ORDER — POTASSIUM CHLORIDE 10 MEQ/100ML IV SOLN
10.0000 meq | INTRAVENOUS | Status: AC
  Administered 2017-11-03: 10 meq via INTRAVENOUS
  Filled 2017-11-03 (×2): qty 100

## 2017-11-03 MED ORDER — POTASSIUM CHLORIDE 10 MEQ/100ML IV SOLN: 10.0000 meq | INTRAVENOUS | Status: DC

## 2017-11-03 MED ORDER — BRINZOLAMIDE 1 % OP SUSP
1.0000 [drp] | Freq: Two times a day (BID) | OPHTHALMIC | Status: DC
  Administered 2017-11-03 – 2017-11-06 (×7): 1 [drp] via OPHTHALMIC
  Filled 2017-11-03: qty 10

## 2017-11-03 MED ORDER — BOOST / RESOURCE BREEZE PO LIQD CUSTOM
1.0000 | Freq: Two times a day (BID) | ORAL | Status: DC
  Administered 2017-11-03 – 2017-11-06 (×5): 1 via ORAL

## 2017-11-03 MED ORDER — JUVEN PO PACK
1.0000 | PACK | Freq: Two times a day (BID) | ORAL | Status: DC
  Administered 2017-11-03 – 2017-11-06 (×7): 1 via ORAL
  Filled 2017-11-03 (×7): qty 1

## 2017-11-03 MED ORDER — POTASSIUM CHLORIDE 10 MEQ/100ML IV SOLN
10.0000 meq | Freq: Once | INTRAVENOUS | Status: AC
  Administered 2017-11-03: 10 meq via INTRAVENOUS
  Filled 2017-11-03: qty 100

## 2017-11-03 MED ORDER — BRIMONIDINE TARTRATE 0.2 % OP SOLN
1.0000 [drp] | Freq: Two times a day (BID) | OPHTHALMIC | Status: DC
  Administered 2017-11-03 – 2017-11-06 (×7): 1 [drp] via OPHTHALMIC
  Filled 2017-11-03: qty 5

## 2017-11-03 MED ORDER — ADULT MULTIVITAMIN W/MINERALS CH
1.0000 | ORAL_TABLET | Freq: Every day | ORAL | Status: DC
  Administered 2017-11-04 – 2017-11-06 (×3): 1 via ORAL
  Filled 2017-11-03 (×3): qty 1

## 2017-11-03 MED ORDER — MAGNESIUM SULFATE 2 GM/50ML IV SOLN
2.0000 g | Freq: Once | INTRAVENOUS | Status: AC
  Administered 2017-11-03: 2 g via INTRAVENOUS
  Filled 2017-11-03: qty 50

## 2017-11-03 MED ORDER — LATANOPROST 0.005 % OP SOLN
1.0000 [drp] | Freq: Every day | OPHTHALMIC | Status: DC
  Administered 2017-11-03 – 2017-11-05 (×3): 1 [drp] via OPHTHALMIC
  Filled 2017-11-03: qty 2.5

## 2017-11-03 NOTE — Progress Notes (Signed)
Best effort with NIF was -40 cmh20

## 2017-11-03 NOTE — Progress Notes (Signed)
Pt was attempting to take morning medications and did aspirate on medications. Patient was sitting at a 90 degree angle straight up when he had this episode. Patient's oxygen did drop down to 85% on room air during this episode. 2L of [place] was started on patient, oxygen level is currently staying between 91-92% right now on 2L [place]. Will continue to monitor patient.  Carmela Hurt, RN

## 2017-11-03 NOTE — Progress Notes (Signed)
Initial Nutrition Assessment  DOCUMENTATION CODES:   Not applicable  INTERVENTION:  - Will order Boost Breeze BID, each supplement provides 250 kcal and 9 grams of protein. - Will order Juven BID, each packet provides 80 calories and 14 grams of amino acids; supplement contains CaHMB, glutamine, and arginine, to promote wound healing. - Will order daily multivitamin with minerals.  - Continue to encourage PO intakes.    NUTRITION DIAGNOSIS:   Increased nutrient needs related to wound healing as evidenced by estimated needs.  GOAL:   Patient will meet greater than or equal to 90% of their needs  MONITOR:   PO intake, Supplement acceptance, Weight trends, Labs, Skin  REASON FOR ASSESSMENT:   Other (Comment)(Pressure Injury report)  ASSESSMENT:   84 year old male with medical history of recurrent aspiration PNA, myasthenia gravis, COPD, A. fib, HFrEF, dysphagia and C3 spinal cord injury.  Patient presented to the ED complaining of SOB, congestion, and hypoxia at the nursing home facility.  In the ED patient was found to be tachypneic with conversational dyspnea, hypoxia requiring 2 to 3 L of oxygen (prior to this patient was not on oxygen), mild elevated WBC and chest x-ray with bilateral infiltrates favoring PNA.  Patient was admitted with working diagnosis of aspiration PNA.  BMI indicates normal weight. Per chart review, patient has been eating 25-50% of meals since admission. Patient reports he lives at a facility and that the food is not good there but his daughters sometimes bring him items he enjoys. He does not care for most of the food here and states that is contributing to low intakes. Visualized lunch tray with ~25% completion.   Patient has been non-ambulatory since breaking his leg in October 2018. Shortly after he was noted to have a C3 spinal cord injury which has led to severely decreased range of motion and functionality of arms and hands. At home he has spill-proof  cups with straws with large openings and adaptive utensils so he is able to self-feed. Talked with Dr. Quincy Simmonds about OT consult so that adaptive utensils can be provided during hospitalization. Patient states he has been encouraged to use chin tuck technique when consuming liquids but does not feel this helps. He is able to chew well with softer foods and when items are cut into bite-sized pieces. Will not change diet order at this time as that will limit foods patient is able to order.   Patient has tried Ensure in the past and does not like it. Will trial other supplements as outlined above. Unable to obtain weight-related hx from patient. Per chart review, current weight of 188 lb is consistent with weight in October 2018.   Noted hypokalemia and hypomagnesemia but do not feel that these are related to refeeding. Will continue to monitor.   Medications reviewed; 100 mg Colace BID, 40 mg oral Lasix/day, 200 mcg oral Synthroid/day, 40 mg oral Protonix BID, 625 mg Fibercon/day, 1 packet Miralax/day, 10 mEq IV KCl x1 run today, 40 mEq K-Dur x1 dose yesterday.  Labs reviewed; K: 2.8 mmol/L, Ca: 8.3 mg/dL, Mg: 1.5 mg/dL     NUTRITION - FOCUSED PHYSICAL EXAM:  Completed but did not assess upper arms or legs given limited mobility to these areas.  Diet Order:   Diet Order            Diet regular Room service appropriate? Yes; Fluid consistency: Thin  Diet effective now              EDUCATION  NEEDS:   No education needs have been identified at this time  Skin:  Skin Assessment: Skin Integrity Issues: Skin Integrity Issues:: Unstageable Unstageable: full thickness to sacrum  Last BM:  9/3 (day PTA)  Height:   Ht Readings from Last 1 Encounters:  11/01/17 6\' 2"  (1.88 m)    Weight:   Wt Readings from Last 1 Encounters:  11/01/17 85.3 kg    Ideal Body Weight:  86.36 kg  BMI:  Body mass index is 24.14 kg/m.  Estimated Nutritional Needs:   Kcal:  2130-2300  Protein:   115-125 grams  Fluid:  >/= 2 L/day      Jarome Matin, MS, RD, LDN, [facility] Inpatient Clinical Dietitian Pager # [phone] After hours/weekend pager # [phone]

## 2017-11-03 NOTE — Clinical Social Work Placement (Signed)
   CLINICAL SOCIAL WORK PLACEMENT  NOTE  Date:  11/03/2017  Patient Details  Name: Travis Ross MRN: [id number] Date of Birth: 1926/07/19  Clinical Social Work is seeking post-discharge placement for this patient at the [place] level of care (*CSW will initial, date and re-position this form in  chart as items are completed):  Yes   Patient/family provided with [facility] Work Department's list of facilities offering this level of care within the geographic area requested by the patient (or if unable, by the patient's family).  Yes   Patient/family informed of their freedom to choose among providers that offer the needed level of care, that participate in Medicare, Medicaid or managed care program needed by the patient, have an available bed and are willing to accept the patient.  Yes   Patient/family informed of [facility]'s ownership interest in [facility] and [facility], as well as of the fact that they are under no obligation to receive care at these facilities.  PASRR submitted to EDS on       PASRR number received on       Existing PASRR number confirmed on 11/03/17     FL2 transmitted to all facilities in geographic area requested by pt/family on 11/03/17     FL2 transmitted to all facilities within larger geographic area on       Patient informed that his/her managed care company has contracts with or will negotiate with certain facilities, including the following:        Yes   Patient/family informed of bed offers received.  Patient chooses bed at       Physician recommends and patient chooses bed at      Patient to be transferred to   on  .  Patient to be transferred to facility by       Patient family notified on   of transfer.  Name of family member notified:        PHYSICIAN       Additional Comment:    _______________________________________________ Burnis Medin, LCSW 11/03/2017, 2:41 PM

## 2017-11-03 NOTE — Progress Notes (Addendum)
PROGRESS NOTE [facility]   Travis Ross   SAY:[id number] DOB: 1926-08-06  DOA: 11/01/2017 PCP: [name], MD   Brief Narrative:  Travis Ross is a 84 year old male with medical history of recurrent aspiration pneumonia, myasthenia gravis, COPD, paroxysmal A. fib, HFrEF, dysphagia and C3 spinal cord injury.  Patient presented to the emergency department complaining of shortness of breath, congestion and hypoxic at the nursing home facility.  In the ED patient was found to be tachypneic with conversational dyspnea, hypoxia requiring 2 to 3 L of oxygen, prior to this patient was not on oxygen, mild elevated WBC and chest x-ray with bilateral infiltrates favoring pneumonia.  Patient was admitted with working diagnosis of aspiration pneumonia, started on empiric antibiotics.  Subjective: Patient seen and examined, today he is not concerned about his pneumonia however remains on oxygen supplementation at 2 L with intermittent desaturations.  He is concerned about his face, he has some scabbing and burning sensation that is chronic from cancer treatment  Assessment & Plan: Acute respiratory failure with hypoxia secondary to aspiration pneumonia Continues to require O2 supplementation, was able to wean down slightly however after another choking episode requiring 3 L of nasal cannula.  Bilateral pneumonia Clinically improving, WBC better, feel that shortness of breath has improved.  However patient had another choking episode while taking his medications.  Subsequently spiked a fever likely aspirated again.  We will continue antibiotic therapy with Zosyn.  Continue supportive treatment with bronchodilators, mucolytic's and oxygen supplementation.  Patient's continue to wish to eat regular diet.  He understands risk of continuous aspiration.  Myasthenia gravis Does not seem an exacerbation at this time.  Continue Imuran  PAF Heart rate improved, continue Toprol and  Eliquis.  HFrEF Last known EF about 40%, currently on Lasix 40 mg daily.  No signs of fluid overload.  Continue beta-blocker and ARB  Hypokalemia/hypomagnesemia Replete Check BMP and magnesium in a.m.  Dysphagia Continue with recurrent episodes of aspiration, underlying myasthenia gravis and history of C3 spinal cord injury.  Patient declined speech evaluation and request regular diet.  Patient and family understand the risks but willing to accept this risk as respect for patient wishes.  DVT prophylaxis: Eliquis  Code Status: DNR Family Communication: Daughter via phone  Disposition Plan: SNF in 1 to 2 days if respiratory status remains stable.  Consultants:   None   Procedures:   None   Antimicrobials: Anti-infectives (From admission, onward)   Start     Dose/Rate Route Frequency Ordered Stop   11/01/17 2200  piperacillin-tazobactam (ZOSYN) IVPB 3.375 g     3.375 g 12.5 mL/hr over 240 Minutes Intravenous Every 8 hours 11/01/17 1322     11/01/17 1245  vancomycin (VANCOCIN) 1,500 mg in sodium chloride 0.9 % 500 mL IVPB     1,500 mg 250 mL/hr over 120 Minutes Intravenous  Once 11/01/17 1242 11/01/17 1639   11/01/17 1245  piperacillin-tazobactam (ZOSYN) IVPB 3.375 g     3.375 g 100 mL/hr over 30 Minutes Intravenous  Once 11/01/17 1242 11/01/17 1354      Objective: Vitals:   11/03/17 1534 11/03/17 1627 11/03/17 1634 11/03/17 1658  BP:    (!) 157/87  Pulse: (!) 103 (!) 118  (!) 103  Resp:    20  Temp:   (!) 100.6 F (38.1 C)   TempSrc:   Oral   SpO2: 97% 93%  95%  Weight:      Height:        Intake/Output  Summary (Last 24 hours) at 11/03/2017 1710 Last data filed at 11/03/2017 1632 Gross per 24 hour  Intake 779 ml  Output 1100 ml  Net -321 ml   Filed Weights   11/01/17 1254 11/01/17 1427  Weight: 77.1 kg 85.3 kg    Examination:  General: Pt is alert, awake, not in acute distress Cardiovascular: Tachycardia, S1/S2 +, no rubs, no gallops Respiratory:  Decreased BS, diffuse rhonchi and rales more prominent on RLL no wheezing. Abdominal: Soft Extremities: Bilateral lower trace edema Skin: Multiple scabbed lesions seen right forehead crossing midline with erythema through extending to right jaw.   Data Reviewed: I have personally reviewed following labs and imaging studies  CBC: Recent Labs  Lab 11/01/17 1125 11/02/17 0619 11/03/17 0541  WBC 10.6* 10.9* 9.9  NEUTROABS 9.8*  --   --   HGB 10.0* 10.1* 9.6*  HCT 32.6* 33.1* 30.9*  MCV 86.2 86.4 87.3  PLT 171 163 466   Basic Metabolic Panel: Recent Labs  Lab 11/01/17 1125 11/02/17 0619 11/03/17 0541  NA 142 142 143  K 3.0* 3.4* 2.8*  CL 107 103 104  CO2 25 26 27   GLUCOSE 147* 124* 95  BUN 25* 26* 23  CREATININE 0.79 0.67 0.69  CALCIUM 8.2* 8.5* 8.3*  MG  --   --  1.5*   GFR: Estimated Creatinine Clearance: 72.8 mL/min (by C-G formula based on SCr of 0.69 mg/dL). Liver Function Tests: Recent Labs  Lab 11/01/17 1125  AST 17  ALT 12  ALKPHOS 102  BILITOT 1.2  PROT 5.6*  ALBUMIN 2.7*   No results for input(s): LIPASE, AMYLASE in the last 168 hours. No results for input(s): AMMONIA in the last 168 hours. Coagulation Profile: No results for input(s): INR, PROTIME in the last 168 hours. Cardiac Enzymes: Recent Labs  Lab 11/01/17 1125  TROPONINI 0.04*   BNP (last 3 results) No results for input(s): PROBNP in the last 8760 hours. HbA1C: No results for input(s): HGBA1C in the last 72 hours. CBG: No results for input(s): GLUCAP in the last 168 hours. Lipid Profile: No results for input(s): CHOL, HDL, LDLCALC, TRIG, CHOLHDL, LDLDIRECT in the last 72 hours. Thyroid Function Tests: No results for input(s): TSH, T4TOTAL, FREET4, T3FREE, THYROIDAB in the last 72 hours. Anemia Panel: No results for input(s): VITAMINB12, FOLATE, FERRITIN, TIBC, IRON, RETICCTPCT in the last 72 hours. Sepsis Labs: Recent Labs  Lab 11/02/17 0823  PROCALCITON <0.10    Recent  Results (from the past 240 hour(s))  MRSA PCR Screening     Status: None   Collection Time: 11/01/17  3:08 PM  Result Value Ref Range Status   MRSA by PCR NEGATIVE NEGATIVE Final    Comment:        The GeneXpert MRSA Assay (FDA approved for NASAL specimens only), is one component of a comprehensive MRSA colonization surveillance program. It is not intended to diagnose MRSA infection nor to guide or monitor treatment for MRSA infections. Performed at [facility], [place] [address], [place], [place] [address]       Radiology Studies: No results found.  Scheduled Meds: . apixaban  5 mg Oral BID  . atorvastatin  10 mg Oral QPM  . azaTHIOprine  50 mg Oral BID  . benzonatate  100 mg Oral QHS  . brinzolamide  1 drop Both Eyes BID   And  . brimonidine  1 drop Both Eyes BID  . docusate sodium  100 mg Oral BID  . feeding  supplement  1 Container Oral BID BM  . fluticasone  1 spray Each Nare Daily  . furosemide  40 mg Oral Daily  . guaiFENesin  600 mg Oral BID  . ipratropium-albuterol  3 mL Nebulization QID  . lactobacillus  1 g Oral TID WC  . latanoprost  1 drop Both Eyes QHS  . levothyroxine  200 mcg Oral QAC breakfast  . losartan  12.5 mg Oral Daily  . metoprolol succinate  100 mg Oral Daily  . morphine  15 mg Oral BID  . multivitamin with minerals  1 tablet Oral Daily  . nutrition supplement (JUVEN)  1 packet Oral BID BM  . pantoprazole  40 mg Oral BID  . polycarbophil  625 mg Oral QHS  . polyethylene glycol  17 g Oral Daily  . pregabalin  25 mg Oral Daily  . senna  1 tablet Oral QHS  . sertraline  150 mg Oral Daily  . sodium chloride flush  3 mL Intravenous Q12H  . temazepam  30 mg Oral QHS   Continuous Infusions: . sodium chloride    . sodium chloride Stopped (11/03/17 1103)  . piperacillin-tazobactam (ZOSYN)  IV 3.375 g (11/03/17 1430)     LOS: 2 days    Time spent: Total of 25 minutes spent with pt, greater than 50% of which was spent in  discussion of  treatment, counseling and coordination of care   Chipper Oman, MD Pager: Text Page via [email or web address]   If 7PM-7AM, please contact night-coverage [email or web address] 11/03/2017, 5:10 PM   Note - This record has been created using [employer]. Chart creation errors have been sought, but may not always have been located. Such creation errors do not reflect on the standard of medical care.

## 2017-11-03 NOTE — Progress Notes (Signed)
NIF -40 with good effort X3.

## 2017-11-03 NOTE — NC FL2 (Addendum)
[place] LEVEL OF CARE SCREENING TOOL     IDENTIFICATION  Patient Name: Travis Ross Birthdate: 05-Mar-1926 Sex: male Admission Date (Current Location): 11/01/2017  [facility] and [place] Number:  Herbalist and Address:  [facility],  [place], [place]      Provider Number: [id number]  Attending Physician Name and Address:  Patrecia Pour, Christean Grief, MD  Relative Name and Phone Number:  Truett Perna [phone]    Current Level of Care: Hospital Recommended Level of Care: [place] Prior Approval Number:    Date Approved/Denied:   PASRR Number: [id number]  Discharge Plan: SNF    Current Diagnoses: Patient Active Problem List   Diagnosis Date Noted  . Pneumonia 11/01/2017  . Chronic hypercapnic respiratory failure ([place]) 09/01/2017  . Aspiration pneumonia of both lungs (HCC)/ chronic/ recurrent 08/29/2017  . Acute respiratory failure with hypoxia ([place]) 08/15/2017  . Hyperlipidemia 08/15/2017  . COPD with chronic bronchitis and emphysema ([place]) 08/15/2017  . GERD (gastroesophageal reflux disease) 08/15/2017  . Hypothyroidism (acquired) 08/15/2017  . History of Cervical spinal cord compression (HCC) 08/15/2017  . Myasthenia gravis ([place]) 08/15/2017  . Acute on chronic systolic heart failure ([place]) 08/15/2017  . Acute hypokalemia 08/15/2017  . Decubitus ulcer of sacral region, stage 1 08/15/2017  . Pressure injury of sacral region, stage 2 02/06/2017  . Acute on chronic systolic (congestive) heart failure ([place]) 02/06/2017  . Community acquired pneumonia 02/04/2017  . A-fib ([place])   . Periprosthetic fracture around internal prosthetic right knee joint 12/15/2016  . Failed total knee arthroplasty ([place]) 12/15/2016  . Permanent atrial fibrillation ([place]) 12/05/2016  . Pre-operative cardiovascular examination 12/05/2016  . Venous stasis of both lower extremities 12/05/2016  . Essential hypertension     Orientation  RESPIRATION BLADDER Height & Weight     Self, Time, Situation, Place  O2 Continent Weight: 188 lb 0.8 oz (85.3 kg) Height:  6\' 2"  (188 cm)  BEHAVIORAL SYMPTOMS/MOOD NEUROLOGICAL BOWEL NUTRITION STATUS      Continent Diet(regular)  AMBULATORY STATUS COMMUNICATION OF NEEDS Skin   Total Care Verbally PU Stage and Appropriate Care   PU Stage 2 Dressing: (stage 2 on lower spinal protrusion and darkened blanchable area in gluteal fold. Pt has multiple areas of full thickness and partial thickness skin tears on right arm and weeping rashes on right side of face and right ear. Foam already on these areas. )   I repositioned the foam dressing to place it into the gluteal fold instead of pressing the buttocks together to further add to the intertriginous problem. Foam was already on stage 2 on spinal process.                 Personal Care Assistance Level of Assistance  Bathing, Feeding, Dressing Bathing Assistance: Maximum assistance Feeding assistance: Independent Dressing Assistance: Maximum assistance     Functional Limitations Info  Sight, Hearing, Speech Sight Info: Adequate Hearing Info: Impaired Speech Info: Adequate    SPECIAL CARE FACTORS FREQUENCY                       Contractures      Additional Factors Info  Code Status, Allergies Code Status Info: DNR Allergies Info: NKA           Current Medications (11/03/2017):  This is the current hospital active medication list Current Facility-Administered Medications  Medication Dose Route Frequency Provider Last Rate Last Dose  . 0.9 %  sodium chloride infusion  250 mL Intravenous PRN Emokpae, Courage, MD      . 0.9 %  sodium chloride infusion   Intravenous Continuous Denton Brick, Courage, MD 10 mL/hr at 11/03/17 0840    . acetaminophen (TYLENOL) tablet 650 mg  650 mg Oral Q6H PRN Emokpae, Courage, MD       Or  . acetaminophen (TYLENOL) suppository 650 mg  650 mg Rectal Q6H PRN Emokpae, Courage, MD      . albuterol  (PROVENTIL) (2.5 MG/3ML) 0.083% nebulizer solution 2.5 mg  2.5 mg Nebulization Q2H PRN Emokpae, Courage, MD      . apixaban (ELIQUIS) tablet 5 mg  5 mg Oral BID Roxan Hockey, MD   5 mg at 11/02/17 2136  . atorvastatin (LIPITOR) tablet 10 mg  10 mg Oral QPM Emokpae, Courage, MD   10 mg at 11/02/17 1849  . azaTHIOprine (IMURAN) tablet 50 mg  50 mg Oral BID Roxan Hockey, MD   50 mg at 11/02/17 2136  . benzonatate (TESSALON) capsule 100 mg  100 mg Oral QHS Emokpae, Courage, MD   100 mg at 11/02/17 2137  . docusate sodium (COLACE) capsule 100 mg  100 mg Oral BID Roxan Hockey, MD   100 mg at 11/02/17 2136  . fluticasone (FLONASE) 50 MCG/ACT nasal spray 1 spray  1 spray Each Nare Daily Emokpae, Courage, MD   1 spray at 11/02/17 0830  . furosemide (LASIX) tablet 40 mg  40 mg Oral Daily Emokpae, Courage, MD   40 mg at 11/02/17 0826  . guaiFENesin (MUCINEX) 12 hr tablet 600 mg  600 mg Oral BID Roxan Hockey, MD   600 mg at 11/02/17 2136  . guaiFENesin (ROBITUSSIN) 100 MG/5ML solution 300 mg  15 mL Oral Q4H PRN Emokpae, Courage, MD      . ipratropium-albuterol (DUONEB) 0.5-2.5 (3) MG/3ML nebulizer solution 3 mL  3 mL Nebulization QID Emokpae, Courage, MD   3 mL at 11/03/17 0900  . lactobacillus (FLORANEX/LACTINEX) granules 1 g  1 g Oral TID WC Emokpae, Courage, MD   1 g at 11/03/17 0808  . levothyroxine (SYNTHROID, LEVOTHROID) tablet 200 mcg  200 mcg Oral QAC breakfast Denton Brick, Courage, MD   200 mcg at 11/03/17 0615  . losartan (COZAAR) tablet 12.5 mg  12.5 mg Oral Daily Emokpae, Courage, MD   12.5 mg at 11/02/17 0823  . magnesium hydroxide (MILK OF MAGNESIA) suspension 30 mL  30 mL Oral Daily PRN Emokpae, Courage, MD      . metoprolol succinate (TOPROL-XL) 24 hr tablet 100 mg  100 mg Oral Daily Emokpae, Courage, MD   100 mg at 11/02/17 0823  . morphine (MS CONTIN) 12 hr tablet 15 mg  15 mg Oral BID Roxan Hockey, MD   15 mg at 11/02/17 2136  . ondansetron (ZOFRAN) tablet 4 mg  4 mg Oral Q6H  PRN Emokpae, Courage, MD       Or  . ondansetron (ZOFRAN) injection 4 mg  4 mg Intravenous Q6H PRN Emokpae, Courage, MD      . pantoprazole (PROTONIX) EC tablet 40 mg  40 mg Oral BID Roxan Hockey, MD   40 mg at 11/02/17 2137  . piperacillin-tazobactam (ZOSYN) IVPB 3.375 g  3.375 g Intravenous Q8H Green, Terri L, RPH 12.5 mL/hr at 11/03/17 0610 3.375 g at 11/03/17 0610  . polycarbophil (FIBERCON) tablet 625 mg  625 mg Oral QHS Roxan Hockey, MD   625 mg at 11/02/17 2136  . polyethylene glycol (MIRALAX / GLYCOLAX)  packet 17 g  17 g Oral Daily PRN Roxan Hockey, MD   17 g at 11/01/17 2244  . polyethylene glycol (MIRALAX / GLYCOLAX) packet 17 g  17 g Oral Daily Patrecia Pour, Christean Grief, MD   17 g at 11/02/17 1203  . potassium chloride 10 mEq in 100 mL IVPB  10 mEq Intravenous Q1 Hr x 3 Patrecia Pour, Christean Grief, MD      . pregabalin (LYRICA) capsule 25 mg  25 mg Oral Daily Emokpae, Courage, MD   25 mg at 11/02/17 0830  . senna (SENOKOT) tablet 8.6 mg  1 tablet Oral QHS Emokpae, Courage, MD   8.6 mg at 11/02/17 2137  . sertraline (ZOLOFT) tablet 150 mg  150 mg Oral Daily Emokpae, Courage, MD   150 mg at 11/02/17 0826  . sodium chloride flush (NS) 0.9 % injection 3 mL  3 mL Intravenous Q12H Emokpae, Courage, MD      . sodium chloride flush (NS) 0.9 % injection 3 mL  3 mL Intravenous PRN Emokpae, Courage, MD      . temazepam (RESTORIL) capsule 30 mg  30 mg Oral QHS Emokpae, Courage, MD   30 mg at 11/02/17 2136  . traZODone (DESYREL) tablet 50 mg  50 mg Oral QHS PRN Roxan Hockey, MD         Discharge Medications: Please see discharge summary for a list of discharge medications.  Relevant Imaging Results:  Relevant Lab Results:   Additional Information SS#: [id number]  Burnis Medin, LCSW

## 2017-11-04 ENCOUNTER — Inpatient Hospital Stay (HOSPITAL_COMMUNITY): Payer: Medicare HMO

## 2017-11-04 LAB — BASIC METABOLIC PANEL
ANION GAP: 13 (ref 5–15)
BUN: 18 mg/dL (ref 8–23)
CHLORIDE: 101 mmol/L (ref 98–111)
CO2: 29 mmol/L (ref 22–32)
CREATININE: 0.65 mg/dL (ref 0.61–1.24)
Calcium: 8.3 mg/dL — ABNORMAL LOW (ref 8.9–10.3)
Glucose, Bld: 95 mg/dL (ref 70–99)
Potassium: 3.2 mmol/L — ABNORMAL LOW (ref 3.5–5.1)
Sodium: 143 mmol/L (ref 135–145)

## 2017-11-04 LAB — MAGNESIUM: MAGNESIUM: 1.8 mg/dL (ref 1.7–2.4)

## 2017-11-04 MED ORDER — POTASSIUM CHLORIDE CRYS ER 20 MEQ PO TBCR
40.0000 meq | EXTENDED_RELEASE_TABLET | ORAL | Status: AC
  Administered 2017-11-04 (×2): 40 meq via ORAL
  Filled 2017-11-04 (×2): qty 2

## 2017-11-04 MED ORDER — VALACYCLOVIR HCL 500 MG PO TABS
1000.0000 mg | ORAL_TABLET | Freq: Three times a day (TID) | ORAL | Status: DC
  Administered 2017-11-04 – 2017-11-06 (×8): 1000 mg via ORAL
  Filled 2017-11-04 (×10): qty 2

## 2017-11-04 MED ORDER — IPRATROPIUM BROMIDE 0.02 % IN SOLN
0.5000 mg | Freq: Three times a day (TID) | RESPIRATORY_TRACT | Status: DC
  Administered 2017-11-04 – 2017-11-06 (×7): 0.5 mg via RESPIRATORY_TRACT
  Filled 2017-11-04 (×7): qty 2.5

## 2017-11-04 MED ORDER — PREDNISONE 20 MG PO TABS
40.0000 mg | ORAL_TABLET | Freq: Every day | ORAL | Status: DC
  Administered 2017-11-04 – 2017-11-06 (×3): 40 mg via ORAL
  Filled 2017-11-04 (×3): qty 2

## 2017-11-04 MED ORDER — LEVALBUTEROL HCL 0.63 MG/3ML IN NEBU
0.6300 mg | INHALATION_SOLUTION | Freq: Three times a day (TID) | RESPIRATORY_TRACT | Status: DC
  Administered 2017-11-04 – 2017-11-06 (×7): 0.63 mg via RESPIRATORY_TRACT
  Filled 2017-11-04 (×7): qty 3

## 2017-11-04 MED ORDER — MAGNESIUM SULFATE 2 GM/50ML IV SOLN
2.0000 g | Freq: Once | INTRAVENOUS | Status: AC
  Administered 2017-11-04: 2 g via INTRAVENOUS
  Filled 2017-11-04: qty 50

## 2017-11-04 NOTE — Progress Notes (Signed)
Patient transferred to airborne isolation room 1441 per protocol for suspected shingles. All belongings sent with patient. Daughter [name] made aware of transfer. Report given to [employer].

## 2017-11-04 NOTE — Progress Notes (Signed)
PROGRESS NOTE [facility]   Travis Ross   XFG:[id number] DOB: October 20, 1926  DOA: 11/01/2017 PCP: [name], MD   Brief Narrative:  Travis Ross is a 84 year old male with medical history of recurrent aspiration pneumonia, myasthenia gravis, COPD, paroxysmal A. fib, HFrEF, dysphagia and C3 spinal cord injury.  Patient presented to the emergency department complaining of shortness of breath, congestion and hypoxic at the nursing home facility.  In the ED patient was found to be tachypneic with conversational dyspnea, hypoxia requiring 2 to 3 L of oxygen, prior to this patient was not on oxygen, mild elevated WBC and chest x-ray with bilateral infiltrates favoring pneumonia.  Patient was admitted with working diagnosis of aspiration pneumonia, started on empiric antibiotics.  Subjective: Patient seen and examined, he continues to compliant about facial pain and burning sensation. No had two choking episodes.  Had afebrile episode yesterday.  Assessment & Plan: Acute respiratory failure with hypoxia secondary to aspiration pneumonia O2 sats stable at 2 L nasal cannula.  Chest x-ray shows improvement of right lower infiltrate, worsening of left lower infiltrate.  Bilateral pneumonia - aspiration Clinically continues to improve, given febrile episode and worsening of left-sided infiltrate we will continue IV antibiotic.  Will de-escalate to Unasyn and continue to monitor.  Continue supportive treatment with bronchodilators, mucolytics and oxygen supplementation.  Patient is at high risk of for aspiration due to dysphagia, however patient insists on having regular diet.  Patient and family understands risk.  Right facial lesion Initially patient was treated with topical chemotherapy for squamous cell carcinoma.  This has brought lesion up to the surface where he began to apply lidocaine.  These episodes normally last few days however this time around patient developed vesicles and crusted  lesion.  Given current time of symptoms about 2-3 weeks now worsening and expanding of lesions, burning sensation and location on trigeminal dermatomes will treat for possible zoster infection.  Start valacyclovir 1 g 3 times daily x 7 days and prednisone 40 mg daily for 5 days.  Myasthenia gravis Stable continue Imuran  PAF HR stable, afib. Continue Eliquis and Toprol, few episodes of V. tach.  K low will replete.  Keep K of 4.  HFrEF Last known EF about 40%, currently on Lasix 40 mg daily.  No signs of fluid overload. Continue beta-blocker and ARB  Hypokalemia/hypomagnesemia Replete as needed  Dysphagia Continue with recurrent episodes of aspiration, underlying myasthenia gravis and history of C3 spinal cord injury.  Patient declined speech evaluation and request regular diet.  Patient and family understand the risks but willing to accept this risk as respect for patient wishes.  DVT prophylaxis: Eliquis  Code Status: DNR Family Communication: Daughter via phone  Disposition Plan: SNF in 1 to 2 days   Consultants:   None   Procedures:   None   Antimicrobials: Anti-infectives (From admission, onward)   Start     Dose/Rate Route Frequency Ordered Stop   11/04/17 1200  valACYclovir (VALTREX) tablet 1,000 mg     1,000 mg Oral 3 times daily 11/04/17 1035 11/11/17 0959   11/01/17 2200  piperacillin-tazobactam (ZOSYN) IVPB 3.375 g     3.375 g 12.5 mL/hr over 240 Minutes Intravenous Every 8 hours 11/01/17 1322     11/01/17 1245  vancomycin (VANCOCIN) 1,500 mg in sodium chloride 0.9 % 500 mL IVPB     1,500 mg 250 mL/hr over 120 Minutes Intravenous  Once 11/01/17 1242 11/01/17 1639   11/01/17 1245  piperacillin-tazobactam (ZOSYN) IVPB 3.375  g     3.375 g 100 mL/hr over 30 Minutes Intravenous  Once 11/01/17 1242 11/01/17 1354      Objective: Vitals:   11/03/17 2120 11/04/17 0514 11/04/17 0817 11/04/17 0847  BP: 121/75 134/90  (!) 134/98  Pulse: 93 90  100  Resp: 20 20  20     Temp: 98 F (36.7 C) 97.9 F (36.6 C)    TempSrc: Oral Oral    SpO2: 97% 100% 96% 97%  Weight:      Height:        Intake/Output Summary (Last 24 hours) at 11/04/2017 1346 Last data filed at 11/04/2017 1147 Gross per 24 hour  Intake 360.29 ml  Output 975 ml  Net -614.71 ml   Filed Weights   11/01/17 1254 11/01/17 1427  Weight: 77.1 kg 85.3 kg    Examination:  General: NAD Cardiovascular: Irr Irr, S1/S2 +, no rubs, no gallops Respiratory: Improved aeration, gargling in upper airway. Abdominal: Soft, NT, ND, bowel sounds + Extremities: No lower extremity edema Skin: Multiple scabbed/vesicular lesions on trigeminal area, there is some lesions crossing the midline of the forehead however this is likely related to cancer treatment.  Lesions on the left are healing.  Data Reviewed: I have personally reviewed following labs and imaging studies  CBC: Recent Labs  Lab 11/01/17 1125 11/02/17 0619 11/03/17 0541  WBC 10.6* 10.9* 9.9  NEUTROABS 9.8*  --   --   HGB 10.0* 10.1* 9.6*  HCT 32.6* 33.1* 30.9*  MCV 86.2 86.4 87.3  PLT 171 163 981   Basic Metabolic Panel: Recent Labs  Lab 11/01/17 1125 11/02/17 0619 11/03/17 0541 11/04/17 0537  NA 142 142 143 143  K 3.0* 3.4* 2.8* 3.2*  CL 107 103 104 101  CO2 25 26 27 29   GLUCOSE 147* 124* 95 95  BUN 25* 26* 23 18  CREATININE 0.79 0.67 0.69 0.65  CALCIUM 8.2* 8.5* 8.3* 8.3*  MG  --   --  1.5* 1.8   GFR: Estimated Creatinine Clearance: 72.8 mL/min (by C-G formula based on SCr of 0.65 mg/dL). Liver Function Tests: Recent Labs  Lab 11/01/17 1125  AST 17  ALT 12  ALKPHOS 102  BILITOT 1.2  PROT 5.6*  ALBUMIN 2.7*   No results for input(s): LIPASE, AMYLASE in the last 168 hours. No results for input(s): AMMONIA in the last 168 hours. Coagulation Profile: No results for input(s): INR, PROTIME in the last 168 hours. Cardiac Enzymes: Recent Labs  Lab 11/01/17 1125  TROPONINI 0.04*   BNP (last 3 results) No  results for input(s): PROBNP in the last 8760 hours. HbA1C: No results for input(s): HGBA1C in the last 72 hours. CBG: No results for input(s): GLUCAP in the last 168 hours. Lipid Profile: No results for input(s): CHOL, HDL, LDLCALC, TRIG, CHOLHDL, LDLDIRECT in the last 72 hours. Thyroid Function Tests: No results for input(s): TSH, T4TOTAL, FREET4, T3FREE, THYROIDAB in the last 72 hours. Anemia Panel: No results for input(s): VITAMINB12, FOLATE, FERRITIN, TIBC, IRON, RETICCTPCT in the last 72 hours. Sepsis Labs: Recent Labs  Lab 11/02/17 0823  PROCALCITON <0.10    Recent Results (from the past 240 hour(s))  MRSA PCR Screening     Status: None   Collection Time: 11/01/17  3:08 PM  Result Value Ref Range Status   MRSA by PCR NEGATIVE NEGATIVE Final    Comment:        The GeneXpert MRSA Assay (FDA approved for NASAL specimens only), is one component  of a comprehensive MRSA colonization surveillance program. It is not intended to diagnose MRSA infection nor to guide or monitor treatment for MRSA infections. Performed at [facility], [place] [address], [place],  [address]       Radiology Studies: Dg Chest Port 1 View  Result Date: 11/04/2017 CLINICAL DATA:  Shortness of breath. EXAM: PORTABLE CHEST 1 VIEW COMPARISON:  November 01, 2017 FINDINGS: The infiltrate in the right base has improved. The infiltrate in the left mid lung is worsened. Possible small left effusion. Cardiomegaly. No other change. IMPRESSION: 1. Improvement of the right basilar infiltrate. The left mid lung infiltrate has worsened. Recommend follow-up to resolution. Electronically Signed   By: Dorise Bullion III M.D   On: 11/04/2017 12:13    Scheduled Meds: . apixaban  5 mg Oral BID  . atorvastatin  10 mg Oral QPM  . azaTHIOprine  50 mg Oral BID  . benzonatate  100 mg Oral QHS  . brinzolamide  1 drop Both Eyes BID   And  . brimonidine  1 drop Both Eyes BID  . docusate sodium   100 mg Oral BID  . feeding supplement  1 Container Oral BID BM  . fluticasone  1 spray Each Nare Daily  . furosemide  40 mg Oral Daily  . guaiFENesin  600 mg Oral BID  . ipratropium  0.5 mg Nebulization TID  . lactobacillus  1 g Oral TID WC  . latanoprost  1 drop Both Eyes QHS  . levalbuterol  0.63 mg Nebulization TID  . levothyroxine  200 mcg Oral QAC breakfast  . losartan  12.5 mg Oral Daily  . metoprolol succinate  100 mg Oral Daily  . morphine  15 mg Oral BID  . multivitamin with minerals  1 tablet Oral Daily  . nutrition supplement (JUVEN)  1 packet Oral BID BM  . pantoprazole  40 mg Oral BID  . polycarbophil  625 mg Oral QHS  . polyethylene glycol  17 g Oral Daily  . potassium chloride  40 mEq Oral Q4H  . predniSONE  40 mg Oral Q breakfast  . pregabalin  25 mg Oral Daily  . senna  1 tablet Oral QHS  . sertraline  150 mg Oral Daily  . sodium chloride flush  3 mL Intravenous Q12H  . temazepam  30 mg Oral QHS  . valACYclovir  1,000 mg Oral TID   Continuous Infusions: . sodium chloride    . sodium chloride Stopped (11/03/17 1103)  . magnesium sulfate 1 - 4 g bolus IVPB 2 g (11/04/17 1323)  . piperacillin-tazobactam (ZOSYN)  IV 3.375 g (11/04/17 0513)     LOS: 3 days    Time spent: Total of 25 minutes spent with pt, greater than 50% of which was spent in discussion of  treatment, counseling and coordination of care   Chipper Oman, MD Pager: Text Page via [email or web address]   If 7PM-7AM, please contact night-coverage [email or web address] 11/04/2017, 1:46 PM   Note - This record has been created using [employer]. Chart creation errors have been sought, but may not always have been located. Such creation errors do not reflect on the standard of medical care.

## 2017-11-04 NOTE — Progress Notes (Signed)
Patient had a 16 beat run of V-Tach. Currently in A-Fib with PVCs in the 90s. Pt asymptomatic, VSS. MD Quincy Simmonds paged to be made aware. Will continue to monitor closely.

## 2017-11-04 NOTE — Progress Notes (Signed)
NIF - 36 

## 2017-11-05 DIAGNOSIS — L8990 Pressure ulcer of unspecified site, unspecified stage: Secondary | ICD-10-CM

## 2017-11-05 LAB — BASIC METABOLIC PANEL
Anion gap: 8 (ref 5–15)
BUN: 26 mg/dL — ABNORMAL HIGH (ref 8–23)
CHLORIDE: 103 mmol/L (ref 98–111)
CO2: 29 mmol/L (ref 22–32)
Calcium: 8.6 mg/dL — ABNORMAL LOW (ref 8.9–10.3)
Creatinine, Ser: 0.58 mg/dL — ABNORMAL LOW (ref 0.61–1.24)
GFR calc non Af Amer: >60 mL/min (ref >60)
Glucose, Bld: 159 mg/dL — ABNORMAL HIGH (ref 70–99)
POTASSIUM: 4.1 mmol/L (ref 3.5–5.1)
SODIUM: 140 mmol/L (ref 135–145)

## 2017-11-05 LAB — MAGNESIUM: MAGNESIUM: 2.1 mg/dL (ref 1.7–2.4)

## 2017-11-05 MED ORDER — SODIUM CHLORIDE 0.9 % IV SOLN
1.5000 g | Freq: Four times a day (QID) | INTRAVENOUS | Status: DC
  Administered 2017-11-05 – 2017-11-06 (×5): 1.5 g via INTRAVENOUS
  Filled 2017-11-05 (×8): qty 1.5

## 2017-11-05 MED ORDER — AQUAPHOR EX OINT
TOPICAL_OINTMENT | Freq: Every day | CUTANEOUS | Status: DC | PRN
  Administered 2017-11-05 – 2017-11-06 (×2): via TOPICAL
  Administered 2017-11-06: 1 via TOPICAL
  Filled 2017-11-05: qty 50

## 2017-11-05 NOTE — Progress Notes (Signed)
Patient unable to perform NIF tonight

## 2017-11-05 NOTE — Progress Notes (Addendum)
NIF -40 

## 2017-11-05 NOTE — Progress Notes (Signed)
NIF was -30 at 2055

## 2017-11-05 NOTE — Progress Notes (Signed)
PROGRESS NOTE [facility]   Travis Ross   XNT:[id number] DOB: 07/04/1926  DOA: 11/01/2017 PCP: Travis Poll, MD   Brief Narrative:  Travis Ross is a 84 year old male with medical history of recurrent aspiration pneumonia, myasthenia gravis, COPD, paroxysmal A. fib, HFrEF, dysphagia and C3 spinal cord injury.  Patient presented to the emergency department complaining of shortness of breath, congestion and hypoxic at the nursing home facility.  In the ED patient was found to be tachypneic with conversational dyspnea, hypoxia requiring 2 to 3 L of oxygen, prior to this patient was not on oxygen, mild elevated WBC and chest x-ray with bilateral infiltrates favoring pneumonia.  Patient was admitted with working diagnosis of aspiration pneumonia, started on empiric antibiotics.  Subjective: Patient seen and examined, he is doing much better today.  Breathing has significantly improved.  Attempting to wean off oxygen.  Facial pain slightly improved.  Less erythematous.  Assessment & Plan: Acute respiratory failure with hypoxia secondary to aspiration pneumonia Respiratory status has improved, on oxygen 1 L nasal cannula with good saturations.  Continue to treat underlying condition.  Bilateral pneumonia - aspiration Patient clinically improving, initially was treated with Zosyn has been de-escalate to Unasyn.  If remain afebrile, will transition to Augmentin.  Continue supportive treatment with bronchodilators, mucolytic's and oxygen supplementation.  Patient was started on prednisone for possible trigeminal zoster likely helping with pulmonary symptoms as well. Patient is at high risk of for aspiration due to dysphagia, however patient insists on having regular diet.  Patient and family understands risk.  Right facial lesion Initially patient was treated with topical chemotherapy for squamous cell carcinoma.  This has brought lesion up to the skin surface where he began to apply  lidocaine.  These episodes normally last few days however this time around patient developed vesicles and crusted lesion.  Given current time of symptoms about 2-3 weeks now worsening and expanding of lesions, burning sensation and location on trigeminal dermatomes will treat for possible zoster infection.  Continue  valacyclovir 1 g 3 times daily x 7 days and prednisone 40 mg daily for 5 days. Apply Aquafor daily PRN.   Myasthenia gravis Stable continue Imuran  PAF HR stable, afib. Continue Eliquis and Toprol, few episodes of V. tach.  K low will replete.  Keep K of 4.  HFrEF Last known EF about 40%, currently on Lasix 40 mg daily.  No signs of fluid overload. Continue beta-blocker and ARB  Hypokalemia/hypomagnesemia Resolved   Dysphagia Continue with recurrent episodes of aspiration, underlying myasthenia gravis and history of C3 spinal cord injury.  Patient declined speech evaluation and request regular diet.  Patient and family understand the risks but willing to accept this risk as respect for patient wishes.  DVT prophylaxis: Eliquis  Code Status: DNR Family Communication: Daughter via phone  Disposition Plan: SNF in AM if remains stable   Consultants:   None   Procedures:   None   Antimicrobials: Anti-infectives (From admission, onward)   Start     Dose/Rate Route Frequency Ordered Stop   11/05/17 1200  ampicillin-sulbactam (UNASYN) 1.5 g in sodium chloride 0.9 % 100 mL IVPB     1.5 g 200 mL/hr over 30 Minutes Intravenous Every 6 hours 11/05/17 0805     11/04/17 1200  valACYclovir (VALTREX) tablet 1,000 mg     1,000 mg Oral 3 times daily 11/04/17 1035 11/11/17 0959   11/01/17 2200  piperacillin-tazobactam (ZOSYN) IVPB 3.375 g  Status:  Discontinued  3.375 g 12.5 mL/hr over 240 Minutes Intravenous Every 8 hours 11/01/17 1322 11/05/17 0805   11/01/17 1245  vancomycin (VANCOCIN) 1,500 mg in sodium chloride 0.9 % 500 mL IVPB     1,500 mg 250 mL/hr over 120 Minutes  Intravenous  Once 11/01/17 1242 11/01/17 1639   11/01/17 1245  piperacillin-tazobactam (ZOSYN) IVPB 3.375 g     3.375 g 100 mL/hr over 30 Minutes Intravenous  Once 11/01/17 1242 11/01/17 1354      Objective: Vitals:   11/04/17 2046 11/04/17 2239 11/05/17 0648 11/05/17 0951  BP:  118/84 (!) 141/86   Pulse:  (!) 104 97   Resp:  16 16   Temp:  98.2 F (36.8 C) 97.8 F (36.6 C)   TempSrc:  Oral Oral   SpO2: 98% 97% 100% 96%  Weight:      Height:        Intake/Output Summary (Last 24 hours) at 11/05/2017 1320 Last data filed at 11/05/2017 0601 Gross per 24 hour  Intake 490.03 ml  Output 600 ml  Net -109.97 ml   Filed Weights   11/01/17 1254 11/01/17 1427  Weight: 77.1 kg 85.3 kg    Examination:   General: NAD Cardiovascular:  Irr Irr , S1/S2 +, no rubs, no gallops Respiratory: Good air entry continues to improve from yesterday. Less rhonchi, no wheezing  Abdominal: Soft, NT, ND, bowel sounds + Extremities: No lower extremity edema Skin: Multiple scabbed/vesicular lesions on trigeminal area, there is some lesions crossing the midline of the forehead however this is likely related to cancer treatment.  Lesions on the left are healing.  Data Reviewed: I have personally reviewed following labs and imaging studies  CBC: Recent Labs  Lab 11/01/17 1125 11/02/17 0619 11/03/17 0541  WBC 10.6* 10.9* 9.9  NEUTROABS 9.8*  --   --   HGB 10.0* 10.1* 9.6*  HCT 32.6* 33.1* 30.9*  MCV 86.2 86.4 87.3  PLT 171 163 097   Basic Metabolic Panel: Recent Labs  Lab 11/01/17 1125 11/02/17 0619 11/03/17 0541 11/04/17 0537 11/05/17 0840  NA 142 142 143 143 140  K 3.0* 3.4* 2.8* 3.2* 4.1  CL 107 103 104 101 103  CO2 25 26 27 29 29   GLUCOSE 147* 124* 95 95 159*  BUN 25* 26* 23 18 26*  CREATININE 0.79 0.67 0.69 0.65 0.58*  CALCIUM 8.2* 8.5* 8.3* 8.3* 8.6*  MG  --   --  1.5* 1.8 2.1   GFR: Estimated Creatinine Clearance: 72.8 mL/min (A) (by C-G formula based on SCr of 0.58 mg/dL  (L)). Liver Function Tests: Recent Labs  Lab 11/01/17 1125  AST 17  ALT 12  ALKPHOS 102  BILITOT 1.2  PROT 5.6*  ALBUMIN 2.7*   No results for input(s): LIPASE, AMYLASE in the last 168 hours. No results for input(s): AMMONIA in the last 168 hours. Coagulation Profile: No results for input(s): INR, PROTIME in the last 168 hours. Cardiac Enzymes: Recent Labs  Lab 11/01/17 1125  TROPONINI 0.04*   BNP (last 3 results) No results for input(s): PROBNP in the last 8760 hours. HbA1C: No results for input(s): HGBA1C in the last 72 hours. CBG: No results for input(s): GLUCAP in the last 168 hours. Lipid Profile: No results for input(s): CHOL, HDL, LDLCALC, TRIG, CHOLHDL, LDLDIRECT in the last 72 hours. Thyroid Function Tests: No results for input(s): TSH, T4TOTAL, FREET4, T3FREE, THYROIDAB in the last 72 hours. Anemia Panel: No results for input(s): VITAMINB12, FOLATE, FERRITIN, TIBC, IRON, RETICCTPCT  in the last 72 hours. Sepsis Labs: Recent Labs  Lab 11/02/17 0823  PROCALCITON <0.10    Recent Results (from the past 240 hour(s))  MRSA PCR Screening     Status: None   Collection Time: 11/01/17  3:08 PM  Result Value Ref Range Status   MRSA by PCR NEGATIVE NEGATIVE Final    Comment:        The GeneXpert MRSA Assay (FDA approved for NASAL specimens only), is one component of a comprehensive MRSA colonization surveillance program. It is not intended to diagnose MRSA infection nor to guide or monitor treatment for MRSA infections. Performed at [facility], [place] [address] [place], [place] [address]       Radiology Studies: Dg Chest Port 1 View  Result Date: 11/04/2017 CLINICAL DATA:  Shortness of breath. EXAM: PORTABLE CHEST 1 VIEW COMPARISON:  November 01, 2017 FINDINGS: The infiltrate in the right base has improved. The infiltrate in the left mid lung is worsened. Possible small left effusion. Cardiomegaly. No other change. IMPRESSION: 1.  Improvement of the right basilar infiltrate. The left mid lung infiltrate has worsened. Recommend follow-up to resolution. Electronically Signed   By: Dorise Bullion III M.D   On: 11/04/2017 12:13    Scheduled Meds: . apixaban  5 mg Oral BID  . atorvastatin  10 mg Oral QPM  . azaTHIOprine  50 mg Oral BID  . benzonatate  100 mg Oral QHS  . brinzolamide  1 drop Both Eyes BID   And  . brimonidine  1 drop Both Eyes BID  . docusate sodium  100 mg Oral BID  . feeding supplement  1 Container Oral BID BM  . fluticasone  1 spray Each Nare Daily  . furosemide  40 mg Oral Daily  . guaiFENesin  600 mg Oral BID  . ipratropium  0.5 mg Nebulization TID  . lactobacillus  1 g Oral TID WC  . latanoprost  1 drop Both Eyes QHS  . levalbuterol  0.63 mg Nebulization TID  . levothyroxine  200 mcg Oral QAC breakfast  . losartan  12.5 mg Oral Daily  . metoprolol succinate  100 mg Oral Daily  . morphine  15 mg Oral BID  . multivitamin with minerals  1 tablet Oral Daily  . nutrition supplement (JUVEN)  1 packet Oral BID BM  . pantoprazole  40 mg Oral BID  . polycarbophil  625 mg Oral QHS  . polyethylene glycol  17 g Oral Daily  . predniSONE  40 mg Oral Q breakfast  . pregabalin  25 mg Oral Daily  . senna  1 tablet Oral QHS  . sertraline  150 mg Oral Daily  . sodium chloride flush  3 mL Intravenous Q12H  . temazepam  30 mg Oral QHS  . valACYclovir  1,000 mg Oral TID   Continuous Infusions: . sodium chloride    . sodium chloride Stopped (11/03/17 1103)  . ampicillin-sulbactam (UNASYN) IV 1.5 g (11/05/17 1252)     LOS: 4 days    Time spent: Total of 25 minutes spent with pt, greater than 50% of which was spent in discussion of  treatment, counseling and coordination of care   Chipper Oman, MD Pager: Text Page via [email or web address]   If 7PM-7AM, please contact night-coverage [email or web address] 11/05/2017, 1:20 PM   Note - This record has been created using [employer]. Chart creation errors have  been sought, but may not always have been located. Such creation errors do not  reflect on the standard of medical care.

## 2017-11-06 DIAGNOSIS — R498 Other voice and resonance disorders: Secondary | ICD-10-CM | POA: Diagnosis not present

## 2017-11-06 DIAGNOSIS — G7000 Myasthenia gravis without (acute) exacerbation: Secondary | ICD-10-CM | POA: Diagnosis not present

## 2017-11-06 DIAGNOSIS — G894 Chronic pain syndrome: Secondary | ICD-10-CM | POA: Diagnosis not present

## 2017-11-06 DIAGNOSIS — J9621 Acute and chronic respiratory failure with hypoxia: Secondary | ICD-10-CM | POA: Diagnosis not present

## 2017-11-06 DIAGNOSIS — M6281 Muscle weakness (generalized): Secondary | ICD-10-CM | POA: Diagnosis not present

## 2017-11-06 DIAGNOSIS — R1312 Dysphagia, oropharyngeal phase: Secondary | ICD-10-CM | POA: Diagnosis not present

## 2017-11-06 DIAGNOSIS — J189 Pneumonia, unspecified organism: Secondary | ICD-10-CM | POA: Diagnosis not present

## 2017-11-06 DIAGNOSIS — R41841 Cognitive communication deficit: Secondary | ICD-10-CM | POA: Diagnosis not present

## 2017-11-06 DIAGNOSIS — Z736 Limitation of activities due to disability: Secondary | ICD-10-CM | POA: Diagnosis not present

## 2017-11-06 MED ORDER — VALACYCLOVIR HCL 1 G PO TABS: 1000.0000 mg | ORAL_TABLET | Freq: Three times a day (TID) | ORAL | 0 refills | Status: AC

## 2017-11-06 MED ORDER — PREDNISONE 10 MG PO TABS: 10.0000 mg | ORAL_TABLET | Freq: Every day | ORAL | 0 refills | Status: DC

## 2017-11-06 MED ORDER — HYDROXYZINE HCL 10 MG PO TABS: 10.0000 mg | ORAL_TABLET | Freq: Three times a day (TID) | ORAL | 0 refills | Status: DC | PRN

## 2017-11-06 MED ORDER — AQUAPHOR EX OINT: TOPICAL_OINTMENT | Freq: Every day | CUTANEOUS | 0 refills | Status: DC | PRN

## 2017-11-06 MED ORDER — AMOXICILLIN-POT CLAVULANATE 875-125 MG PO TABS: 1.0000 | ORAL_TABLET | Freq: Two times a day (BID) | ORAL | 0 refills | Status: AC

## 2017-11-06 NOTE — Care Management Important Message (Addendum)
Important Message  Patient Details IM Letter given to Cookie/Case Manager to present to the Patient Name: Travis Ross MRN: [id number] Date of Birth: May 22, 1926   Medicare Important Message Given:  Yes    Kerin Salen 11/06/2017, 12:24 [place] Message  Patient Details  Name: Travis Ross MRN: [id number] Date of Birth: 1926/03/30   Medicare Important Message Given:  Yes    Kerin Salen 11/06/2017, 12:24 PM

## 2017-11-06 NOTE — Progress Notes (Signed)
NIF of -40cm

## 2017-11-06 NOTE — Discharge Summary (Signed)
Physician Discharge Summary  Travis Ross  EXH:[id number]  DOB: 1926/03/01  DOA: 11/01/2017 PCP: [name], MD  Admit date: 11/01/2017 Discharge date: 11/06/2017  Admitted From: ALF Disposition: SNF   Recommendations for Outpatient Follow-up:  1. Follow up with SNF provider at earliest convenience 2. Avoid lidocaine on face for pain control  3. Please obtain BMP/CBC in one week monitor renal function and hemoglobin  Discharge Condition: Stable, fair prognosis CODE STATUS: DNR Diet recommendation: Heart Healthy   Brief/Interim Summary: For full details see H&P/Progress note, but in brief, Travis Ross is a  84 year old male with medical history of recurrent aspiration pneumonia, myasthenia gravis, COPD, paroxysmal A. fib, HFrEF, dysphagia and C3 spinal cord injury.  Patient presented to the emergency department complaining of shortness of breath, congestion and hypoxic at the nursing home facility.  In the ED patient was found to be tachypneic with conversational dyspnea, hypoxia requiring 2 to 3 L of oxygen, prior to this patient was not on oxygen, mild elevated WBC and chest x-ray with bilateral infiltrates favoring pneumonia.  Patient was admitted with working diagnosis of aspiration pneumonia, started on empiric antibiotics.  Subjective: Patient seen and examined, facial lesions significantly improved.  No more scabbing, erythema almost resolved.  Breathing status is stable.  Had a choking episode last night.  Discharge Diagnoses/Hospital Course:  Principal Problem:   Pneumonia Active Problems:   Essential hypertension   Permanent atrial fibrillation (HCC)   A-fib (HCC)   Acute respiratory failure with hypoxia (HCC)   COPD with chronic bronchitis and emphysema (HCC)   Myasthenia gravis (HCC)   Chronic hypercapnic respiratory failure (HCC)   Pressure injury of skin  Acute respiratory failure with hypoxia secondary to aspiration pneumonia Respiratory status has improved, on  oxygen 1 L nasal cannula with good saturations.  Will stay on oxygen for now.  Patient will likely to remain on oxygen indefinitely with recurrent aspiration.  Bilateral pneumonia - aspiration Clinically improved, initially was treated with Zosyn has been de-escalate to Unasyn.  Remains afebrile, transitioned to Augmentin to complete total of 14 days of antibiotic therapy.  Continue with bronchodilators, mucolytic and oxygen supplementation.  Patient also on prednisone for trigeminal zoster, also contributing to respiratory symptoms as well. Patient is at high risk of for aspiration due to dysphagia, however patient insists on having regular diet.  Patient and family understands risk.   Right facial lesion - Trigeminal Zoster  Initially patient was treated with topical chemotherapy for squamous cell carcinoma. This had brought lesion up to the skin surface where he began to apply lidocaine.  These episodes usually  lasted few days, however this time around patient developed vesicles and crusted lesion.  Felt to be zoster related, given worsening and expanding of lesions, burning sensation and location on trigeminal dermatomes.patient was started on valacyclovir 1 g 3 times daily x 7 days with clinical improvement and prednisone 40 mg daily for 5 days. Apply Aquafor daily PRN.  Continue current therapy. Will add hydroxyzine for itching  Myasthenia gravis Stable continue Imuran  PAF HR stable, afib. Continue Eliquis and Toprol, few episodes of V. tach.  K low will replete.  Keep K of 4.  HFrEF Last known EF about 40%, currently on Lasix 40 mg daily.  No signs of fluid overload. Continue beta-blocker and ARB  Hypokalemia/hypomagnesemia Resolved   Dysphagia Continue with recurrent episodes of aspiration, underlying myasthenia gravis and history of C3 spinal cord injury. Patient declined speech evaluation and request regular diet.  Patient  and family understand the risks but willing to accept  this risk as respect for patient wishes.  All other chronic medical condition were stable during the hospitalization.  Patient was seen by physical therapy, recommending SNF On the day of the discharge the patient's vitals were stable, and no other acute medical condition were reported by patient. the patient was felt safe to be discharge to SNF  Discharge Instructions  You were cared for by a hospitalist during your hospital stay. If you have any questions about your discharge medications or the care you received while you were in the hospital after you are discharged, you can call the unit and asked to speak with the hospitalist on call if the hospitalist that took care of you is not available. Once you are discharged, your primary care physician will handle any further medical issues. Please note that NO REFILLS for any discharge medications will be authorized once you are discharged, as it is imperative that you return to your primary care physician (or establish a relationship with a primary care physician if you do not have one) for your aftercare needs so that they can reassess your need for medications and monitor your lab values.  Discharge Instructions    Call MD for:  difficulty breathing, headache or visual disturbances   Complete by:  As directed    Call MD for:  extreme fatigue   Complete by:  As directed    Call MD for:  hives   Complete by:  As directed    Call MD for:  persistant dizziness or light-headedness   Complete by:  As directed    Call MD for:  persistant nausea and vomiting   Complete by:  As directed    Call MD for:  redness, tenderness, or signs of infection (pain, swelling, redness, odor or green/yellow discharge around incision site)   Complete by:  As directed    Call MD for:  severe uncontrolled pain   Complete by:  As directed    Call MD for:  temperature >100.4   Complete by:  As directed    Diet - low sodium heart healthy   Complete by:  As directed     Increase activity slowly   Complete by:  As directed      Allergies as of 11/06/2017   No Known Allergies     Medication List    STOP taking these medications   aspirin EC 81 MG tablet     TAKE these medications   acetaminophen 325 MG tablet Commonly known as:  TYLENOL Take 650 mg by mouth every 6 (six) hours as needed for moderate pain.   amoxicillin-clavulanate 875-125 MG tablet Commonly known as:  AUGMENTIN Take 1 tablet by mouth every 12 (twelve) hours for 8 days.   apixaban 5 MG Tabs tablet Commonly known as:  ELIQUIS Take 1 tablet (5 mg total) by mouth 2 (two) times daily.   atorvastatin 10 MG tablet Commonly known as:  LIPITOR Take 10 mg by mouth every evening.   azaTHIOprine 50 MG tablet Commonly known as:  IMURAN Take 50 mg by mouth 2 (two) times daily.   benzonatate 100 MG capsule Commonly known as:  TESSALON Take 100 mg by mouth at bedtime.   COMBIVENT RESPIMAT 20-100 MCG/ACT Aers respimat Generic drug:  Ipratropium-Albuterol Inhale 2 puffs into the lungs 2 (two) times daily.   docusate sodium 100 MG capsule Commonly known as:  COLACE Take 100 mg by mouth 2 (two)  times daily.   DULERA 100-5 MCG/ACT Aero Generic drug:  mometasone-formoterol Take 2 puffs by mouth 2 (two) times daily.   Fiber 0.52 g Caps Take 0.52 g by mouth at bedtime.   fluticasone 50 MCG/ACT nasal spray Commonly known as:  FLONASE Place 1 spray into both nostrils daily.   furosemide 40 MG tablet Commonly known as:  LASIX Take 40 mg by mouth daily.   guaiFENesin 600 MG 12 hr tablet Commonly known as:  MUCINEX Take 600 mg by mouth 2 (two) times daily.   hydrOXYzine 10 MG tablet Commonly known as:  ATARAX/VISTARIL Take 1 tablet (10 mg total) by mouth 3 (three) times daily as needed for itching.   levothyroxine 200 MCG tablet Commonly known as:  SYNTHROID, LEVOTHROID Take 200 mcg by mouth daily.   lidocaine 4 % cream Commonly known as:  LMX Apply 1 application topically  as needed. Apply to right side of face as needed for pain.   losartan 25 MG tablet Commonly known as:  COZAAR Take 0.5 tablets (12.5 mg total) by mouth daily.   magnesium hydroxide 400 MG/5ML suspension Commonly known as:  MILK OF MAGNESIA Take 30 mLs by mouth See admin instructions. every 24 hours as needed for constipation.   metoprolol succinate 50 MG 24 hr tablet Commonly known as:  TOPROL-XL Take 2 tablets (100 mg total) by mouth daily.   mineral oil-hydrophilic petrolatum ointment Apply topically daily as needed for dry skin.   morphine 15 MG 12 hr tablet Commonly known as:  MS CONTIN Take 15 mg by mouth 2 (two) times daily. For pain   pantoprazole 40 MG tablet Commonly known as:  PROTONIX Take 40 mg by mouth 2 (two) times daily.   polycarbophil 625 MG tablet Commonly known as:  FIBERCON Take 1 tablet (625 mg total) by mouth at bedtime.   predniSONE 10 MG tablet Commonly known as:  DELTASONE Take 1 tablet (10 mg total) by mouth daily with breakfast. Take 40 mg for 3 more days, then 20 mg for 2 days, then 5 mg daily What changed:  additional instructions   pregabalin 25 MG capsule Commonly known as:  LYRICA Take 25 mg by mouth daily.   senna 8.6 MG tablet Commonly known as:  SENOKOT Take 1 tablet by mouth every evening.   sertraline 100 MG tablet Commonly known as:  ZOLOFT Take 150 mg by mouth daily.   SIMBRINZA 1-0.2 % Susp Generic drug:  Brinzolamide-Brimonidine Place 1 drop into both eyes 2 (two) times daily.   temazepam 30 MG capsule Commonly known as:  RESTORIL Take 30 mg by mouth at bedtime.   TRAVATAN Z 0.004 % Soln ophthalmic solution Generic drug:  Travoprost (BAK Free) Place 1 drop into both eyes at bedtime.   valACYclovir 1000 MG tablet Commonly known as:  VALTREX Take 1 tablet (1,000 mg total) by mouth 3 (three) times daily for 4 days.      Follow-up Information    [name], MD. Schedule an appointment as soon as possible for a  visit in 1 week(s).   Specialty:  Family Medicine Why:  Hospital follow up  Contact information: [address] [place] [address] [phone]          No Known Allergies  Consultations:  None    Procedures/Studies: Dg Chest Port 1 View  Result Date: 11/04/2017 CLINICAL DATA:  Shortness of breath. EXAM: PORTABLE CHEST 1 VIEW COMPARISON:  November 01, 2017 FINDINGS: The infiltrate in  the right base has improved. The infiltrate in the left mid lung is worsened. Possible small left effusion. Cardiomegaly. No other change. IMPRESSION: 1. Improvement of the right basilar infiltrate. The left mid lung infiltrate has worsened. Recommend follow-up to resolution. Electronically Signed   By: Dorise Bullion III M.D   On: 11/04/2017 12:13   Dg Chest Portable 1 View  Result Date: 11/01/2017 CLINICAL DATA:  Weakness.  Difficulty breathing. EXAM: PORTABLE CHEST 1 VIEW COMPARISON:  08/29/2017 FINDINGS: There are hazy infiltrates in the right mid and lower lung zones and at the left lung base. Cardiomegaly. Pulmonary vascularity is normal. No acute bone abnormality. IMPRESSION: Bilateral pulmonary infiltrates. This could represent pneumonia or pulmonary edema. However, the pulmonary vascularity is within normal limits. Electronically Signed   By: Lorriane Shire M.D.   On: 11/01/2017 12:26    Discharge Exam: Vitals:   11/06/17 0633 11/06/17 0935  BP: (!) 155/103   Pulse: 93   Resp: 20   Temp: 97.8 F (36.6 C)   SpO2: 93% 95%   Vitals:   11/05/17 2100 11/05/17 2154 11/06/17 0633 11/06/17 0935  BP: (!) 152/105  (!) 155/103   Pulse: (!) 50  93   Resp: 18  20   Temp: 98.4 F (36.9 C)  97.8 F (36.6 C)   TempSrc: Oral  Oral   SpO2: 94% 93% 93% 95%  Weight:      Height:        General: NAD  Cardiovascular: RRR, S1/S2 +, no rubs, no gallops Respiratory: CTA bilaterally, no wheezing, no rhonchi Abdominal: Soft, NT, ND, bowel sounds + Extremities: No LE edema  Skin:  Mild redness on r side of face, some scattered crusted lesions.   The results of significant diagnostics from this hospitalization (including imaging, microbiology, ancillary and laboratory) are listed below for reference.     Microbiology: Recent Results (from the past 240 hour(s))  MRSA PCR Screening     Status: None   Collection Time: 11/01/17  3:08 PM  Result Value Ref Range Status   MRSA by PCR NEGATIVE NEGATIVE Final    Comment:        The GeneXpert MRSA Assay (FDA approved for NASAL specimens only), is one component of a comprehensive MRSA colonization surveillance program. It is not intended to diagnose MRSA infection nor to guide or monitor treatment for MRSA infections. Performed at [facility], [place] [address], [place], [place] [address]      Labs: BNP (last 3 results) Recent Labs    08/15/17 1229 08/22/17 1234 11/01/17 1125  BNP 338.7* 436.6* 979.8*   Basic Metabolic Panel: Recent Labs  Lab 11/01/17 1125 11/02/17 0619 11/03/17 0541 11/04/17 0537 11/05/17 0840  NA 142 142 143 143 140  K 3.0* 3.4* 2.8* 3.2* 4.1  CL 107 103 104 101 103  CO2 25 26 27 29 29   GLUCOSE 147* 124* 95 95 159*  BUN 25* 26* 23 18 26*  CREATININE 0.79 0.67 0.69 0.65 0.58*  CALCIUM 8.2* 8.5* 8.3* 8.3* 8.6*  MG  --   --  1.5* 1.8 2.1   Liver Function Tests: Recent Labs  Lab 11/01/17 1125  AST 17  ALT 12  ALKPHOS 102  BILITOT 1.2  PROT 5.6*  ALBUMIN 2.7*   No results for input(s): LIPASE, AMYLASE in the last 168 hours. No results for input(s): AMMONIA in the last 168 hours. CBC: Recent Labs  Lab 11/01/17 1125 11/02/17 0619 11/03/17 0541  WBC 10.6* 10.9*  9.9  NEUTROABS 9.8*  --   --   HGB 10.0* 10.1* 9.6*  HCT 32.6* 33.1* 30.9*  MCV 86.2 86.4 87.3  PLT 171 163 162   Cardiac Enzymes: Recent Labs  Lab 11/01/17 1125  TROPONINI 0.04*   BNP: Invalid input(s): POCBNP CBG: No results for input(s): GLUCAP in the last 168 hours. D-Dimer No  results for input(s): DDIMER in the last 72 hours. Hgb A1c No results for input(s): HGBA1C in the last 72 hours. Lipid Profile No results for input(s): CHOL, HDL, LDLCALC, TRIG, CHOLHDL, LDLDIRECT in the last 72 hours. Thyroid function studies No results for input(s): TSH, T4TOTAL, T3FREE, THYROIDAB in the last 72 hours.  Invalid input(s): FREET3 Anemia work up No results for input(s): VITAMINB12, FOLATE, FERRITIN, TIBC, IRON, RETICCTPCT in the last 72 hours. Urinalysis    Component Value Date/Time   COLORURINE YELLOW 08/16/2017 1038   APPEARANCEUR HAZY (A) 08/16/2017 1038   LABSPEC 1.010 08/16/2017 1038   PHURINE 5.0 08/16/2017 1038   GLUCOSEU NEGATIVE 08/16/2017 1038   HGBUR NEGATIVE 08/16/2017 1038   [place] 08/16/2017 1038   KETONESUR NEGATIVE 08/16/2017 1038   PROTEINUR NEGATIVE 08/16/2017 1038   NITRITE NEGATIVE 08/16/2017 1038   LEUKOCYTESUR NEGATIVE 08/16/2017 1038   Sepsis Labs Invalid input(s): PROCALCITONIN,  WBC,  LACTICIDVEN Microbiology Recent Results (from the past 240 hour(s))  MRSA PCR Screening     Status: None   Collection Time: 11/01/17  3:08 PM  Result Value Ref Range Status   MRSA by PCR NEGATIVE NEGATIVE Final    Comment:        The GeneXpert MRSA Assay (FDA approved for NASAL specimens only), is one component of a comprehensive MRSA colonization surveillance program. It is not intended to diagnose MRSA infection nor to guide or monitor treatment for MRSA infections. Performed at [facility], [place] [address] [place], [place] [address]     Time coordinating discharge: 32 minutes  SIGNED:  Chipper Oman, MD  [facility] 11/06/2017, 2:22 PM  Pager please text page via  [email or web address]  Note - This record has been created using [employer]. Chart creation errors have been sought, but may not always have been located. Such creation errors do not reflect on the standard of medical care.

## 2017-11-06 NOTE — Clinical Social Work Placement (Signed)
Pt admitting to [facility] - report [phone].  Moving from his ALF [place] to [place] for Long term care.  Pt's daughter aware and agreeable. DC information provided via the [facility] transportation 4pm.  CLINICAL SOCIAL WORK PLACEMENT  NOTE  Date:  11/06/2017  Patient Details  Name: Travis Ross MRN: [id number] Date of Birth: September 01, 1926  Clinical Social Work is seeking post-discharge placement for this patient at the [place] level of care (*CSW will initial, date and re-position this form in  chart as items are completed):  Yes   Patient/family provided with [place] Work Department's list of facilities offering this level of care within the geographic area requested by the patient (or if unable, by the patient's family).  Yes   Patient/family informed of their freedom to choose among providers that offer the needed level of care, that participate in Medicare, Medicaid or managed care program needed by the patient, have an available bed and are willing to accept the patient.  Yes   Patient/family informed of [facility]'s ownership interest in [facility] and [facility], as well as of the fact that they are under no obligation to receive care at these facilities.  PASRR submitted to EDS on       PASRR number received on       Existing PASRR number confirmed on 11/03/17     FL2 transmitted to all facilities in geographic area requested by pt/family on 11/03/17     FL2 transmitted to all facilities within larger geographic area on       Patient informed that his/her managed care company has contracts with or will negotiate with certain facilities, including the following:        Yes   Patient/family informed of bed offers received.  Patient chooses bed at       Physician recommends and patient chooses bed at      Patient to be transferred to   on  .  Patient to be transferred to facility by       Patient  family notified on   of transfer.  Name of family member notified:        PHYSICIAN       Additional Comment:    _______________________________________________ Nila Nephew, LCSW 11/06/2017, 2:39 PM [phone]

## 2017-11-06 NOTE — Progress Notes (Signed)
Best of three attempts. NIF of 40cmh20

## 2017-11-07 DIAGNOSIS — M21379 Foot drop, unspecified foot: Secondary | ICD-10-CM | POA: Diagnosis not present

## 2017-11-07 DIAGNOSIS — B0222 Postherpetic trigeminal neuralgia: Secondary | ICD-10-CM | POA: Diagnosis not present

## 2017-11-07 DIAGNOSIS — J189 Pneumonia, unspecified organism: Secondary | ICD-10-CM | POA: Diagnosis not present

## 2017-11-07 DIAGNOSIS — G7000 Myasthenia gravis without (acute) exacerbation: Secondary | ICD-10-CM | POA: Diagnosis not present

## 2017-11-08 DIAGNOSIS — J9612 Chronic respiratory failure with hypercapnia: Secondary | ICD-10-CM | POA: Diagnosis not present

## 2017-11-08 DIAGNOSIS — J189 Pneumonia, unspecified organism: Secondary | ICD-10-CM | POA: Diagnosis not present

## 2017-11-08 DIAGNOSIS — B0222 Postherpetic trigeminal neuralgia: Secondary | ICD-10-CM | POA: Diagnosis not present

## 2017-11-08 DIAGNOSIS — I4891 Unspecified atrial fibrillation: Secondary | ICD-10-CM | POA: Diagnosis not present

## 2017-11-13 DIAGNOSIS — Z79899 Other long term (current) drug therapy: Secondary | ICD-10-CM | POA: Diagnosis not present

## 2017-11-14 DIAGNOSIS — M6281 Muscle weakness (generalized): Secondary | ICD-10-CM | POA: Diagnosis not present

## 2017-11-14 DIAGNOSIS — G894 Chronic pain syndrome: Secondary | ICD-10-CM | POA: Diagnosis not present

## 2017-11-16 DIAGNOSIS — G894 Chronic pain syndrome: Secondary | ICD-10-CM | POA: Diagnosis not present

## 2017-11-16 DIAGNOSIS — D649 Anemia, unspecified: Secondary | ICD-10-CM | POA: Diagnosis not present

## 2017-11-16 DIAGNOSIS — I10 Essential (primary) hypertension: Secondary | ICD-10-CM | POA: Diagnosis not present

## 2017-11-16 DIAGNOSIS — M6281 Muscle weakness (generalized): Secondary | ICD-10-CM | POA: Diagnosis not present

## 2017-11-17 DIAGNOSIS — D649 Anemia, unspecified: Secondary | ICD-10-CM | POA: Diagnosis not present

## 2017-11-20 DIAGNOSIS — G894 Chronic pain syndrome: Secondary | ICD-10-CM | POA: Diagnosis not present

## 2017-11-20 DIAGNOSIS — M6281 Muscle weakness (generalized): Secondary | ICD-10-CM | POA: Diagnosis not present

## 2017-11-20 DIAGNOSIS — I10 Essential (primary) hypertension: Secondary | ICD-10-CM | POA: Diagnosis not present

## 2017-11-22 DIAGNOSIS — K089 Disorder of teeth and supporting structures, unspecified: Secondary | ICD-10-CM | POA: Diagnosis not present

## 2017-11-22 DIAGNOSIS — I10 Essential (primary) hypertension: Secondary | ICD-10-CM | POA: Diagnosis not present

## 2017-11-24 DIAGNOSIS — M6281 Muscle weakness (generalized): Secondary | ICD-10-CM | POA: Diagnosis not present

## 2017-11-24 DIAGNOSIS — G894 Chronic pain syndrome: Secondary | ICD-10-CM | POA: Diagnosis not present

## 2017-11-24 DIAGNOSIS — K089 Disorder of teeth and supporting structures, unspecified: Secondary | ICD-10-CM | POA: Diagnosis not present

## 2017-11-26 DIAGNOSIS — M79661 Pain in right lower leg: Secondary | ICD-10-CM | POA: Diagnosis not present

## 2017-11-27 DIAGNOSIS — T148XXA Other injury of unspecified body region, initial encounter: Secondary | ICD-10-CM | POA: Diagnosis not present

## 2017-11-28 DIAGNOSIS — R41841 Cognitive communication deficit: Secondary | ICD-10-CM | POA: Diagnosis not present

## 2017-11-28 DIAGNOSIS — Z736 Limitation of activities due to disability: Secondary | ICD-10-CM | POA: Diagnosis not present

## 2017-11-28 DIAGNOSIS — G7000 Myasthenia gravis without (acute) exacerbation: Secondary | ICD-10-CM | POA: Diagnosis not present

## 2017-11-28 DIAGNOSIS — J189 Pneumonia, unspecified organism: Secondary | ICD-10-CM | POA: Diagnosis not present

## 2017-11-28 DIAGNOSIS — R1312 Dysphagia, oropharyngeal phase: Secondary | ICD-10-CM | POA: Diagnosis not present

## 2017-11-28 DIAGNOSIS — M6281 Muscle weakness (generalized): Secondary | ICD-10-CM | POA: Diagnosis not present

## 2017-11-28 DIAGNOSIS — J9621 Acute and chronic respiratory failure with hypoxia: Secondary | ICD-10-CM | POA: Diagnosis not present

## 2017-11-28 DIAGNOSIS — R498 Other voice and resonance disorders: Secondary | ICD-10-CM | POA: Diagnosis not present

## 2017-11-29 DIAGNOSIS — R498 Other voice and resonance disorders: Secondary | ICD-10-CM | POA: Diagnosis not present

## 2017-11-29 DIAGNOSIS — R41841 Cognitive communication deficit: Secondary | ICD-10-CM | POA: Diagnosis not present

## 2017-11-29 DIAGNOSIS — J9621 Acute and chronic respiratory failure with hypoxia: Secondary | ICD-10-CM | POA: Diagnosis not present

## 2017-11-29 DIAGNOSIS — G7000 Myasthenia gravis without (acute) exacerbation: Secondary | ICD-10-CM | POA: Diagnosis not present

## 2017-11-29 DIAGNOSIS — R1312 Dysphagia, oropharyngeal phase: Secondary | ICD-10-CM | POA: Diagnosis not present

## 2017-11-29 DIAGNOSIS — J189 Pneumonia, unspecified organism: Secondary | ICD-10-CM | POA: Diagnosis not present

## 2017-11-29 DIAGNOSIS — T148XXA Other injury of unspecified body region, initial encounter: Secondary | ICD-10-CM | POA: Diagnosis not present

## 2017-11-29 DIAGNOSIS — Z736 Limitation of activities due to disability: Secondary | ICD-10-CM | POA: Diagnosis not present

## 2017-11-29 DIAGNOSIS — M6281 Muscle weakness (generalized): Secondary | ICD-10-CM | POA: Diagnosis not present

## 2017-11-30 ENCOUNTER — Ambulatory Visit: Payer: Medicare HMO | Admitting: Internal Medicine

## 2017-11-30 DIAGNOSIS — R498 Other voice and resonance disorders: Secondary | ICD-10-CM | POA: Diagnosis not present

## 2017-11-30 DIAGNOSIS — Z736 Limitation of activities due to disability: Secondary | ICD-10-CM | POA: Diagnosis not present

## 2017-11-30 DIAGNOSIS — M6281 Muscle weakness (generalized): Secondary | ICD-10-CM | POA: Diagnosis not present

## 2017-11-30 DIAGNOSIS — G7000 Myasthenia gravis without (acute) exacerbation: Secondary | ICD-10-CM | POA: Diagnosis not present

## 2017-11-30 DIAGNOSIS — R1312 Dysphagia, oropharyngeal phase: Secondary | ICD-10-CM | POA: Diagnosis not present

## 2017-11-30 DIAGNOSIS — J9621 Acute and chronic respiratory failure with hypoxia: Secondary | ICD-10-CM | POA: Diagnosis not present

## 2017-11-30 DIAGNOSIS — J189 Pneumonia, unspecified organism: Secondary | ICD-10-CM | POA: Diagnosis not present

## 2017-11-30 DIAGNOSIS — R41841 Cognitive communication deficit: Secondary | ICD-10-CM | POA: Diagnosis not present

## 2017-12-01 DIAGNOSIS — M6281 Muscle weakness (generalized): Secondary | ICD-10-CM | POA: Diagnosis not present

## 2017-12-01 DIAGNOSIS — Z736 Limitation of activities due to disability: Secondary | ICD-10-CM | POA: Diagnosis not present

## 2017-12-01 DIAGNOSIS — J189 Pneumonia, unspecified organism: Secondary | ICD-10-CM | POA: Diagnosis not present

## 2017-12-01 DIAGNOSIS — R1312 Dysphagia, oropharyngeal phase: Secondary | ICD-10-CM | POA: Diagnosis not present

## 2017-12-01 DIAGNOSIS — J9621 Acute and chronic respiratory failure with hypoxia: Secondary | ICD-10-CM | POA: Diagnosis not present

## 2017-12-01 DIAGNOSIS — R498 Other voice and resonance disorders: Secondary | ICD-10-CM | POA: Diagnosis not present

## 2017-12-01 DIAGNOSIS — R41841 Cognitive communication deficit: Secondary | ICD-10-CM | POA: Diagnosis not present

## 2017-12-01 DIAGNOSIS — G7000 Myasthenia gravis without (acute) exacerbation: Secondary | ICD-10-CM | POA: Diagnosis not present

## 2017-12-04 DIAGNOSIS — M6281 Muscle weakness (generalized): Secondary | ICD-10-CM | POA: Diagnosis not present

## 2017-12-04 DIAGNOSIS — J189 Pneumonia, unspecified organism: Secondary | ICD-10-CM | POA: Diagnosis not present

## 2017-12-04 DIAGNOSIS — J9612 Chronic respiratory failure with hypercapnia: Secondary | ICD-10-CM | POA: Diagnosis not present

## 2017-12-04 DIAGNOSIS — R41841 Cognitive communication deficit: Secondary | ICD-10-CM | POA: Diagnosis not present

## 2017-12-04 DIAGNOSIS — Z736 Limitation of activities due to disability: Secondary | ICD-10-CM | POA: Diagnosis not present

## 2017-12-04 DIAGNOSIS — I5020 Unspecified systolic (congestive) heart failure: Secondary | ICD-10-CM | POA: Diagnosis not present

## 2017-12-04 DIAGNOSIS — G7000 Myasthenia gravis without (acute) exacerbation: Secondary | ICD-10-CM | POA: Diagnosis not present

## 2017-12-04 DIAGNOSIS — I4891 Unspecified atrial fibrillation: Secondary | ICD-10-CM | POA: Diagnosis not present

## 2017-12-04 DIAGNOSIS — I10 Essential (primary) hypertension: Secondary | ICD-10-CM | POA: Diagnosis not present

## 2017-12-04 DIAGNOSIS — G894 Chronic pain syndrome: Secondary | ICD-10-CM | POA: Diagnosis not present

## 2017-12-04 DIAGNOSIS — R498 Other voice and resonance disorders: Secondary | ICD-10-CM | POA: Diagnosis not present

## 2017-12-04 DIAGNOSIS — J9621 Acute and chronic respiratory failure with hypoxia: Secondary | ICD-10-CM | POA: Diagnosis not present

## 2017-12-04 DIAGNOSIS — R1312 Dysphagia, oropharyngeal phase: Secondary | ICD-10-CM | POA: Diagnosis not present

## 2017-12-05 DIAGNOSIS — R41841 Cognitive communication deficit: Secondary | ICD-10-CM | POA: Diagnosis not present

## 2017-12-05 DIAGNOSIS — M6281 Muscle weakness (generalized): Secondary | ICD-10-CM | POA: Diagnosis not present

## 2017-12-05 DIAGNOSIS — R498 Other voice and resonance disorders: Secondary | ICD-10-CM | POA: Diagnosis not present

## 2017-12-05 DIAGNOSIS — R1312 Dysphagia, oropharyngeal phase: Secondary | ICD-10-CM | POA: Diagnosis not present

## 2017-12-05 DIAGNOSIS — J9621 Acute and chronic respiratory failure with hypoxia: Secondary | ICD-10-CM | POA: Diagnosis not present

## 2017-12-05 DIAGNOSIS — J189 Pneumonia, unspecified organism: Secondary | ICD-10-CM | POA: Diagnosis not present

## 2017-12-05 DIAGNOSIS — Z736 Limitation of activities due to disability: Secondary | ICD-10-CM | POA: Diagnosis not present

## 2017-12-05 DIAGNOSIS — G7000 Myasthenia gravis without (acute) exacerbation: Secondary | ICD-10-CM | POA: Diagnosis not present

## 2017-12-06 DIAGNOSIS — J9621 Acute and chronic respiratory failure with hypoxia: Secondary | ICD-10-CM | POA: Diagnosis not present

## 2017-12-06 DIAGNOSIS — R41841 Cognitive communication deficit: Secondary | ICD-10-CM | POA: Diagnosis not present

## 2017-12-06 DIAGNOSIS — Z736 Limitation of activities due to disability: Secondary | ICD-10-CM | POA: Diagnosis not present

## 2017-12-06 DIAGNOSIS — R498 Other voice and resonance disorders: Secondary | ICD-10-CM | POA: Diagnosis not present

## 2017-12-06 DIAGNOSIS — M6281 Muscle weakness (generalized): Secondary | ICD-10-CM | POA: Diagnosis not present

## 2017-12-06 DIAGNOSIS — J189 Pneumonia, unspecified organism: Secondary | ICD-10-CM | POA: Diagnosis not present

## 2017-12-06 DIAGNOSIS — G7000 Myasthenia gravis without (acute) exacerbation: Secondary | ICD-10-CM | POA: Diagnosis not present

## 2017-12-06 DIAGNOSIS — R1312 Dysphagia, oropharyngeal phase: Secondary | ICD-10-CM | POA: Diagnosis not present

## 2017-12-07 DIAGNOSIS — R498 Other voice and resonance disorders: Secondary | ICD-10-CM | POA: Diagnosis not present

## 2017-12-07 DIAGNOSIS — G7000 Myasthenia gravis without (acute) exacerbation: Secondary | ICD-10-CM | POA: Diagnosis not present

## 2017-12-07 DIAGNOSIS — J9621 Acute and chronic respiratory failure with hypoxia: Secondary | ICD-10-CM | POA: Diagnosis not present

## 2017-12-07 DIAGNOSIS — J189 Pneumonia, unspecified organism: Secondary | ICD-10-CM | POA: Diagnosis not present

## 2017-12-07 DIAGNOSIS — R41841 Cognitive communication deficit: Secondary | ICD-10-CM | POA: Diagnosis not present

## 2017-12-07 DIAGNOSIS — R1312 Dysphagia, oropharyngeal phase: Secondary | ICD-10-CM | POA: Diagnosis not present

## 2017-12-07 DIAGNOSIS — M6281 Muscle weakness (generalized): Secondary | ICD-10-CM | POA: Diagnosis not present

## 2017-12-07 DIAGNOSIS — Z736 Limitation of activities due to disability: Secondary | ICD-10-CM | POA: Diagnosis not present

## 2017-12-08 DIAGNOSIS — J189 Pneumonia, unspecified organism: Secondary | ICD-10-CM | POA: Diagnosis not present

## 2017-12-08 DIAGNOSIS — J9621 Acute and chronic respiratory failure with hypoxia: Secondary | ICD-10-CM | POA: Diagnosis not present

## 2017-12-08 DIAGNOSIS — R498 Other voice and resonance disorders: Secondary | ICD-10-CM | POA: Diagnosis not present

## 2017-12-08 DIAGNOSIS — R1312 Dysphagia, oropharyngeal phase: Secondary | ICD-10-CM | POA: Diagnosis not present

## 2017-12-08 DIAGNOSIS — M6281 Muscle weakness (generalized): Secondary | ICD-10-CM | POA: Diagnosis not present

## 2017-12-08 DIAGNOSIS — G7000 Myasthenia gravis without (acute) exacerbation: Secondary | ICD-10-CM | POA: Diagnosis not present

## 2017-12-08 DIAGNOSIS — Z736 Limitation of activities due to disability: Secondary | ICD-10-CM | POA: Diagnosis not present

## 2017-12-08 DIAGNOSIS — R0602 Shortness of breath: Secondary | ICD-10-CM | POA: Diagnosis not present

## 2017-12-08 DIAGNOSIS — R41841 Cognitive communication deficit: Secondary | ICD-10-CM | POA: Diagnosis not present

## 2017-12-08 DIAGNOSIS — R1310 Dysphagia, unspecified: Secondary | ICD-10-CM | POA: Diagnosis not present

## 2017-12-08 DIAGNOSIS — G8929 Other chronic pain: Secondary | ICD-10-CM | POA: Diagnosis not present

## 2017-12-09 DIAGNOSIS — J9621 Acute and chronic respiratory failure with hypoxia: Secondary | ICD-10-CM | POA: Diagnosis not present

## 2017-12-09 DIAGNOSIS — Z736 Limitation of activities due to disability: Secondary | ICD-10-CM | POA: Diagnosis not present

## 2017-12-09 DIAGNOSIS — M6281 Muscle weakness (generalized): Secondary | ICD-10-CM | POA: Diagnosis not present

## 2017-12-09 DIAGNOSIS — G7000 Myasthenia gravis without (acute) exacerbation: Secondary | ICD-10-CM | POA: Diagnosis not present

## 2017-12-09 DIAGNOSIS — J189 Pneumonia, unspecified organism: Secondary | ICD-10-CM | POA: Diagnosis not present

## 2017-12-09 DIAGNOSIS — R1312 Dysphagia, oropharyngeal phase: Secondary | ICD-10-CM | POA: Diagnosis not present

## 2017-12-09 DIAGNOSIS — R498 Other voice and resonance disorders: Secondary | ICD-10-CM | POA: Diagnosis not present

## 2017-12-09 DIAGNOSIS — R41841 Cognitive communication deficit: Secondary | ICD-10-CM | POA: Diagnosis not present

## 2017-12-11 DIAGNOSIS — R1312 Dysphagia, oropharyngeal phase: Secondary | ICD-10-CM | POA: Diagnosis not present

## 2017-12-11 DIAGNOSIS — R41841 Cognitive communication deficit: Secondary | ICD-10-CM | POA: Diagnosis not present

## 2017-12-11 DIAGNOSIS — J9621 Acute and chronic respiratory failure with hypoxia: Secondary | ICD-10-CM | POA: Diagnosis not present

## 2017-12-11 DIAGNOSIS — M6281 Muscle weakness (generalized): Secondary | ICD-10-CM | POA: Diagnosis not present

## 2017-12-11 DIAGNOSIS — R1310 Dysphagia, unspecified: Secondary | ICD-10-CM | POA: Diagnosis not present

## 2017-12-11 DIAGNOSIS — J189 Pneumonia, unspecified organism: Secondary | ICD-10-CM | POA: Diagnosis not present

## 2017-12-11 DIAGNOSIS — I4891 Unspecified atrial fibrillation: Secondary | ICD-10-CM | POA: Diagnosis not present

## 2017-12-11 DIAGNOSIS — R498 Other voice and resonance disorders: Secondary | ICD-10-CM | POA: Diagnosis not present

## 2017-12-11 DIAGNOSIS — G7000 Myasthenia gravis without (acute) exacerbation: Secondary | ICD-10-CM | POA: Diagnosis not present

## 2017-12-11 DIAGNOSIS — Z736 Limitation of activities due to disability: Secondary | ICD-10-CM | POA: Diagnosis not present

## 2017-12-12 DIAGNOSIS — J189 Pneumonia, unspecified organism: Secondary | ICD-10-CM | POA: Diagnosis not present

## 2017-12-12 DIAGNOSIS — J9621 Acute and chronic respiratory failure with hypoxia: Secondary | ICD-10-CM | POA: Diagnosis not present

## 2017-12-12 DIAGNOSIS — Z736 Limitation of activities due to disability: Secondary | ICD-10-CM | POA: Diagnosis not present

## 2017-12-12 DIAGNOSIS — G7000 Myasthenia gravis without (acute) exacerbation: Secondary | ICD-10-CM | POA: Diagnosis not present

## 2017-12-12 DIAGNOSIS — R1312 Dysphagia, oropharyngeal phase: Secondary | ICD-10-CM | POA: Diagnosis not present

## 2017-12-12 DIAGNOSIS — R498 Other voice and resonance disorders: Secondary | ICD-10-CM | POA: Diagnosis not present

## 2017-12-12 DIAGNOSIS — M6281 Muscle weakness (generalized): Secondary | ICD-10-CM | POA: Diagnosis not present

## 2017-12-12 DIAGNOSIS — I70235 Atherosclerosis of native arteries of right leg with ulceration of other part of foot: Secondary | ICD-10-CM | POA: Diagnosis not present

## 2017-12-12 DIAGNOSIS — R41841 Cognitive communication deficit: Secondary | ICD-10-CM | POA: Diagnosis not present

## 2017-12-13 DIAGNOSIS — I70235 Atherosclerosis of native arteries of right leg with ulceration of other part of foot: Secondary | ICD-10-CM | POA: Diagnosis not present

## 2017-12-13 DIAGNOSIS — S9001XD Contusion of right ankle, subsequent encounter: Secondary | ICD-10-CM | POA: Diagnosis not present

## 2017-12-13 DIAGNOSIS — G7000 Myasthenia gravis without (acute) exacerbation: Secondary | ICD-10-CM | POA: Diagnosis not present

## 2017-12-13 DIAGNOSIS — M6281 Muscle weakness (generalized): Secondary | ICD-10-CM | POA: Diagnosis not present

## 2017-12-13 DIAGNOSIS — R41841 Cognitive communication deficit: Secondary | ICD-10-CM | POA: Diagnosis not present

## 2017-12-13 DIAGNOSIS — R1312 Dysphagia, oropharyngeal phase: Secondary | ICD-10-CM | POA: Diagnosis not present

## 2017-12-13 DIAGNOSIS — J189 Pneumonia, unspecified organism: Secondary | ICD-10-CM | POA: Diagnosis not present

## 2017-12-13 DIAGNOSIS — J9621 Acute and chronic respiratory failure with hypoxia: Secondary | ICD-10-CM | POA: Diagnosis not present

## 2017-12-13 DIAGNOSIS — Z736 Limitation of activities due to disability: Secondary | ICD-10-CM | POA: Diagnosis not present

## 2017-12-13 DIAGNOSIS — R498 Other voice and resonance disorders: Secondary | ICD-10-CM | POA: Diagnosis not present

## 2017-12-14 DIAGNOSIS — J189 Pneumonia, unspecified organism: Secondary | ICD-10-CM | POA: Diagnosis not present

## 2017-12-14 DIAGNOSIS — R41841 Cognitive communication deficit: Secondary | ICD-10-CM | POA: Diagnosis not present

## 2017-12-14 DIAGNOSIS — J9621 Acute and chronic respiratory failure with hypoxia: Secondary | ICD-10-CM | POA: Diagnosis not present

## 2017-12-14 DIAGNOSIS — G7000 Myasthenia gravis without (acute) exacerbation: Secondary | ICD-10-CM | POA: Diagnosis not present

## 2017-12-14 DIAGNOSIS — K5900 Constipation, unspecified: Secondary | ICD-10-CM | POA: Diagnosis not present

## 2017-12-14 DIAGNOSIS — B0222 Postherpetic trigeminal neuralgia: Secondary | ICD-10-CM | POA: Diagnosis not present

## 2017-12-14 DIAGNOSIS — M6281 Muscle weakness (generalized): Secondary | ICD-10-CM | POA: Diagnosis not present

## 2017-12-14 DIAGNOSIS — R1312 Dysphagia, oropharyngeal phase: Secondary | ICD-10-CM | POA: Diagnosis not present

## 2017-12-14 DIAGNOSIS — Z736 Limitation of activities due to disability: Secondary | ICD-10-CM | POA: Diagnosis not present

## 2017-12-14 DIAGNOSIS — R498 Other voice and resonance disorders: Secondary | ICD-10-CM | POA: Diagnosis not present

## 2017-12-15 DIAGNOSIS — J189 Pneumonia, unspecified organism: Secondary | ICD-10-CM | POA: Diagnosis not present

## 2017-12-15 DIAGNOSIS — M6281 Muscle weakness (generalized): Secondary | ICD-10-CM | POA: Diagnosis not present

## 2017-12-15 DIAGNOSIS — G7000 Myasthenia gravis without (acute) exacerbation: Secondary | ICD-10-CM | POA: Diagnosis not present

## 2017-12-15 DIAGNOSIS — R41841 Cognitive communication deficit: Secondary | ICD-10-CM | POA: Diagnosis not present

## 2017-12-15 DIAGNOSIS — R1312 Dysphagia, oropharyngeal phase: Secondary | ICD-10-CM | POA: Diagnosis not present

## 2017-12-15 DIAGNOSIS — R498 Other voice and resonance disorders: Secondary | ICD-10-CM | POA: Diagnosis not present

## 2017-12-15 DIAGNOSIS — Z736 Limitation of activities due to disability: Secondary | ICD-10-CM | POA: Diagnosis not present

## 2017-12-15 DIAGNOSIS — J9621 Acute and chronic respiratory failure with hypoxia: Secondary | ICD-10-CM | POA: Diagnosis not present

## 2017-12-16 DIAGNOSIS — G7000 Myasthenia gravis without (acute) exacerbation: Secondary | ICD-10-CM | POA: Diagnosis not present

## 2017-12-16 DIAGNOSIS — R498 Other voice and resonance disorders: Secondary | ICD-10-CM | POA: Diagnosis not present

## 2017-12-16 DIAGNOSIS — R41841 Cognitive communication deficit: Secondary | ICD-10-CM | POA: Diagnosis not present

## 2017-12-16 DIAGNOSIS — M6281 Muscle weakness (generalized): Secondary | ICD-10-CM | POA: Diagnosis not present

## 2017-12-16 DIAGNOSIS — J189 Pneumonia, unspecified organism: Secondary | ICD-10-CM | POA: Diagnosis not present

## 2017-12-16 DIAGNOSIS — J9621 Acute and chronic respiratory failure with hypoxia: Secondary | ICD-10-CM | POA: Diagnosis not present

## 2017-12-16 DIAGNOSIS — Z736 Limitation of activities due to disability: Secondary | ICD-10-CM | POA: Diagnosis not present

## 2017-12-16 DIAGNOSIS — R1312 Dysphagia, oropharyngeal phase: Secondary | ICD-10-CM | POA: Diagnosis not present

## 2017-12-18 DIAGNOSIS — R498 Other voice and resonance disorders: Secondary | ICD-10-CM | POA: Diagnosis not present

## 2017-12-18 DIAGNOSIS — J189 Pneumonia, unspecified organism: Secondary | ICD-10-CM | POA: Diagnosis not present

## 2017-12-18 DIAGNOSIS — G7000 Myasthenia gravis without (acute) exacerbation: Secondary | ICD-10-CM | POA: Diagnosis not present

## 2017-12-18 DIAGNOSIS — Z736 Limitation of activities due to disability: Secondary | ICD-10-CM | POA: Diagnosis not present

## 2017-12-18 DIAGNOSIS — R1312 Dysphagia, oropharyngeal phase: Secondary | ICD-10-CM | POA: Diagnosis not present

## 2017-12-18 DIAGNOSIS — J9621 Acute and chronic respiratory failure with hypoxia: Secondary | ICD-10-CM | POA: Diagnosis not present

## 2017-12-18 DIAGNOSIS — R41841 Cognitive communication deficit: Secondary | ICD-10-CM | POA: Diagnosis not present

## 2017-12-18 DIAGNOSIS — M6281 Muscle weakness (generalized): Secondary | ICD-10-CM | POA: Diagnosis not present

## 2017-12-19 DIAGNOSIS — R498 Other voice and resonance disorders: Secondary | ICD-10-CM | POA: Diagnosis not present

## 2017-12-19 DIAGNOSIS — M6281 Muscle weakness (generalized): Secondary | ICD-10-CM | POA: Diagnosis not present

## 2017-12-19 DIAGNOSIS — J189 Pneumonia, unspecified organism: Secondary | ICD-10-CM | POA: Diagnosis not present

## 2017-12-19 DIAGNOSIS — T148XXA Other injury of unspecified body region, initial encounter: Secondary | ICD-10-CM | POA: Diagnosis not present

## 2017-12-19 DIAGNOSIS — R41841 Cognitive communication deficit: Secondary | ICD-10-CM | POA: Diagnosis not present

## 2017-12-19 DIAGNOSIS — S80819A Abrasion, unspecified lower leg, initial encounter: Secondary | ICD-10-CM | POA: Diagnosis not present

## 2017-12-19 DIAGNOSIS — Z736 Limitation of activities due to disability: Secondary | ICD-10-CM | POA: Diagnosis not present

## 2017-12-19 DIAGNOSIS — G7000 Myasthenia gravis without (acute) exacerbation: Secondary | ICD-10-CM | POA: Diagnosis not present

## 2017-12-19 DIAGNOSIS — J9621 Acute and chronic respiratory failure with hypoxia: Secondary | ICD-10-CM | POA: Diagnosis not present

## 2017-12-19 DIAGNOSIS — R1312 Dysphagia, oropharyngeal phase: Secondary | ICD-10-CM | POA: Diagnosis not present

## 2017-12-20 DIAGNOSIS — Z736 Limitation of activities due to disability: Secondary | ICD-10-CM | POA: Diagnosis not present

## 2017-12-20 DIAGNOSIS — R1312 Dysphagia, oropharyngeal phase: Secondary | ICD-10-CM | POA: Diagnosis not present

## 2017-12-20 DIAGNOSIS — I70235 Atherosclerosis of native arteries of right leg with ulceration of other part of foot: Secondary | ICD-10-CM | POA: Diagnosis not present

## 2017-12-20 DIAGNOSIS — R41841 Cognitive communication deficit: Secondary | ICD-10-CM | POA: Diagnosis not present

## 2017-12-20 DIAGNOSIS — R498 Other voice and resonance disorders: Secondary | ICD-10-CM | POA: Diagnosis not present

## 2017-12-20 DIAGNOSIS — J189 Pneumonia, unspecified organism: Secondary | ICD-10-CM | POA: Diagnosis not present

## 2017-12-20 DIAGNOSIS — M6281 Muscle weakness (generalized): Secondary | ICD-10-CM | POA: Diagnosis not present

## 2017-12-20 DIAGNOSIS — G7000 Myasthenia gravis without (acute) exacerbation: Secondary | ICD-10-CM | POA: Diagnosis not present

## 2017-12-20 DIAGNOSIS — J9621 Acute and chronic respiratory failure with hypoxia: Secondary | ICD-10-CM | POA: Diagnosis not present

## 2017-12-21 DIAGNOSIS — R41841 Cognitive communication deficit: Secondary | ICD-10-CM | POA: Diagnosis not present

## 2017-12-21 DIAGNOSIS — R1312 Dysphagia, oropharyngeal phase: Secondary | ICD-10-CM | POA: Diagnosis not present

## 2017-12-21 DIAGNOSIS — Z736 Limitation of activities due to disability: Secondary | ICD-10-CM | POA: Diagnosis not present

## 2017-12-21 DIAGNOSIS — G7000 Myasthenia gravis without (acute) exacerbation: Secondary | ICD-10-CM | POA: Diagnosis not present

## 2017-12-21 DIAGNOSIS — M6281 Muscle weakness (generalized): Secondary | ICD-10-CM | POA: Diagnosis not present

## 2017-12-21 DIAGNOSIS — J189 Pneumonia, unspecified organism: Secondary | ICD-10-CM | POA: Diagnosis not present

## 2017-12-21 DIAGNOSIS — J9621 Acute and chronic respiratory failure with hypoxia: Secondary | ICD-10-CM | POA: Diagnosis not present

## 2017-12-21 DIAGNOSIS — R498 Other voice and resonance disorders: Secondary | ICD-10-CM | POA: Diagnosis not present

## 2017-12-22 DIAGNOSIS — Z736 Limitation of activities due to disability: Secondary | ICD-10-CM | POA: Diagnosis not present

## 2017-12-22 DIAGNOSIS — R41841 Cognitive communication deficit: Secondary | ICD-10-CM | POA: Diagnosis not present

## 2017-12-22 DIAGNOSIS — J9621 Acute and chronic respiratory failure with hypoxia: Secondary | ICD-10-CM | POA: Diagnosis not present

## 2017-12-22 DIAGNOSIS — J189 Pneumonia, unspecified organism: Secondary | ICD-10-CM | POA: Diagnosis not present

## 2017-12-22 DIAGNOSIS — R498 Other voice and resonance disorders: Secondary | ICD-10-CM | POA: Diagnosis not present

## 2017-12-22 DIAGNOSIS — M6281 Muscle weakness (generalized): Secondary | ICD-10-CM | POA: Diagnosis not present

## 2017-12-22 DIAGNOSIS — R1312 Dysphagia, oropharyngeal phase: Secondary | ICD-10-CM | POA: Diagnosis not present

## 2017-12-22 DIAGNOSIS — G7000 Myasthenia gravis without (acute) exacerbation: Secondary | ICD-10-CM | POA: Diagnosis not present

## 2017-12-23 DIAGNOSIS — M6281 Muscle weakness (generalized): Secondary | ICD-10-CM | POA: Diagnosis not present

## 2017-12-23 DIAGNOSIS — R1312 Dysphagia, oropharyngeal phase: Secondary | ICD-10-CM | POA: Diagnosis not present

## 2017-12-23 DIAGNOSIS — G7000 Myasthenia gravis without (acute) exacerbation: Secondary | ICD-10-CM | POA: Diagnosis not present

## 2017-12-23 DIAGNOSIS — R41841 Cognitive communication deficit: Secondary | ICD-10-CM | POA: Diagnosis not present

## 2017-12-23 DIAGNOSIS — R498 Other voice and resonance disorders: Secondary | ICD-10-CM | POA: Diagnosis not present

## 2017-12-23 DIAGNOSIS — J9621 Acute and chronic respiratory failure with hypoxia: Secondary | ICD-10-CM | POA: Diagnosis not present

## 2017-12-23 DIAGNOSIS — J189 Pneumonia, unspecified organism: Secondary | ICD-10-CM | POA: Diagnosis not present

## 2017-12-23 DIAGNOSIS — Z736 Limitation of activities due to disability: Secondary | ICD-10-CM | POA: Diagnosis not present

## 2017-12-25 DIAGNOSIS — Z736 Limitation of activities due to disability: Secondary | ICD-10-CM | POA: Diagnosis not present

## 2017-12-25 DIAGNOSIS — J189 Pneumonia, unspecified organism: Secondary | ICD-10-CM | POA: Diagnosis not present

## 2017-12-25 DIAGNOSIS — R498 Other voice and resonance disorders: Secondary | ICD-10-CM | POA: Diagnosis not present

## 2017-12-25 DIAGNOSIS — J9621 Acute and chronic respiratory failure with hypoxia: Secondary | ICD-10-CM | POA: Diagnosis not present

## 2017-12-25 DIAGNOSIS — R41841 Cognitive communication deficit: Secondary | ICD-10-CM | POA: Diagnosis not present

## 2017-12-25 DIAGNOSIS — G7000 Myasthenia gravis without (acute) exacerbation: Secondary | ICD-10-CM | POA: Diagnosis not present

## 2017-12-25 DIAGNOSIS — M6281 Muscle weakness (generalized): Secondary | ICD-10-CM | POA: Diagnosis not present

## 2017-12-25 DIAGNOSIS — R1312 Dysphagia, oropharyngeal phase: Secondary | ICD-10-CM | POA: Diagnosis not present

## 2017-12-26 DIAGNOSIS — R1312 Dysphagia, oropharyngeal phase: Secondary | ICD-10-CM | POA: Diagnosis not present

## 2017-12-26 DIAGNOSIS — G7000 Myasthenia gravis without (acute) exacerbation: Secondary | ICD-10-CM | POA: Diagnosis not present

## 2017-12-26 DIAGNOSIS — J189 Pneumonia, unspecified organism: Secondary | ICD-10-CM | POA: Diagnosis not present

## 2017-12-26 DIAGNOSIS — Z736 Limitation of activities due to disability: Secondary | ICD-10-CM | POA: Diagnosis not present

## 2017-12-26 DIAGNOSIS — R41841 Cognitive communication deficit: Secondary | ICD-10-CM | POA: Diagnosis not present

## 2017-12-26 DIAGNOSIS — R498 Other voice and resonance disorders: Secondary | ICD-10-CM | POA: Diagnosis not present

## 2017-12-26 DIAGNOSIS — M6281 Muscle weakness (generalized): Secondary | ICD-10-CM | POA: Diagnosis not present

## 2017-12-26 DIAGNOSIS — J9621 Acute and chronic respiratory failure with hypoxia: Secondary | ICD-10-CM | POA: Diagnosis not present

## 2017-12-27 DIAGNOSIS — R1312 Dysphagia, oropharyngeal phase: Secondary | ICD-10-CM | POA: Diagnosis not present

## 2017-12-27 DIAGNOSIS — R1310 Dysphagia, unspecified: Secondary | ICD-10-CM | POA: Diagnosis not present

## 2017-12-27 DIAGNOSIS — R05 Cough: Secondary | ICD-10-CM | POA: Diagnosis not present

## 2017-12-27 DIAGNOSIS — G7000 Myasthenia gravis without (acute) exacerbation: Secondary | ICD-10-CM | POA: Diagnosis not present

## 2017-12-27 DIAGNOSIS — I70235 Atherosclerosis of native arteries of right leg with ulceration of other part of foot: Secondary | ICD-10-CM | POA: Diagnosis not present

## 2017-12-27 DIAGNOSIS — Z736 Limitation of activities due to disability: Secondary | ICD-10-CM | POA: Diagnosis not present

## 2017-12-27 DIAGNOSIS — L97812 Non-pressure chronic ulcer of other part of right lower leg with fat layer exposed: Secondary | ICD-10-CM | POA: Diagnosis not present

## 2017-12-27 DIAGNOSIS — R0989 Other specified symptoms and signs involving the circulatory and respiratory systems: Secondary | ICD-10-CM | POA: Diagnosis not present

## 2017-12-27 DIAGNOSIS — M6281 Muscle weakness (generalized): Secondary | ICD-10-CM | POA: Diagnosis not present

## 2017-12-27 DIAGNOSIS — R41841 Cognitive communication deficit: Secondary | ICD-10-CM | POA: Diagnosis not present

## 2017-12-27 DIAGNOSIS — J9621 Acute and chronic respiratory failure with hypoxia: Secondary | ICD-10-CM | POA: Diagnosis not present

## 2017-12-27 DIAGNOSIS — J189 Pneumonia, unspecified organism: Secondary | ICD-10-CM | POA: Diagnosis not present

## 2017-12-27 DIAGNOSIS — R498 Other voice and resonance disorders: Secondary | ICD-10-CM | POA: Diagnosis not present

## 2017-12-28 DIAGNOSIS — M6281 Muscle weakness (generalized): Secondary | ICD-10-CM | POA: Diagnosis not present

## 2017-12-28 DIAGNOSIS — Z736 Limitation of activities due to disability: Secondary | ICD-10-CM | POA: Diagnosis not present

## 2017-12-28 DIAGNOSIS — R1312 Dysphagia, oropharyngeal phase: Secondary | ICD-10-CM | POA: Diagnosis not present

## 2017-12-28 DIAGNOSIS — R41841 Cognitive communication deficit: Secondary | ICD-10-CM | POA: Diagnosis not present

## 2017-12-28 DIAGNOSIS — J9621 Acute and chronic respiratory failure with hypoxia: Secondary | ICD-10-CM | POA: Diagnosis not present

## 2017-12-28 DIAGNOSIS — I10 Essential (primary) hypertension: Secondary | ICD-10-CM | POA: Diagnosis not present

## 2017-12-28 DIAGNOSIS — I4891 Unspecified atrial fibrillation: Secondary | ICD-10-CM | POA: Diagnosis not present

## 2017-12-28 DIAGNOSIS — R498 Other voice and resonance disorders: Secondary | ICD-10-CM | POA: Diagnosis not present

## 2017-12-28 DIAGNOSIS — J189 Pneumonia, unspecified organism: Secondary | ICD-10-CM | POA: Diagnosis not present

## 2017-12-28 DIAGNOSIS — J690 Pneumonitis due to inhalation of food and vomit: Secondary | ICD-10-CM | POA: Diagnosis not present

## 2017-12-28 DIAGNOSIS — G7000 Myasthenia gravis without (acute) exacerbation: Secondary | ICD-10-CM | POA: Diagnosis not present

## 2017-12-28 DIAGNOSIS — I5020 Unspecified systolic (congestive) heart failure: Secondary | ICD-10-CM | POA: Diagnosis not present

## 2017-12-29 DIAGNOSIS — G7000 Myasthenia gravis without (acute) exacerbation: Secondary | ICD-10-CM | POA: Diagnosis not present

## 2017-12-29 DIAGNOSIS — Z736 Limitation of activities due to disability: Secondary | ICD-10-CM | POA: Diagnosis not present

## 2017-12-29 DIAGNOSIS — M6281 Muscle weakness (generalized): Secondary | ICD-10-CM | POA: Diagnosis not present

## 2017-12-29 DIAGNOSIS — R41841 Cognitive communication deficit: Secondary | ICD-10-CM | POA: Diagnosis not present

## 2017-12-29 DIAGNOSIS — R1312 Dysphagia, oropharyngeal phase: Secondary | ICD-10-CM | POA: Diagnosis not present

## 2017-12-29 DIAGNOSIS — J189 Pneumonia, unspecified organism: Secondary | ICD-10-CM | POA: Diagnosis not present

## 2017-12-29 DIAGNOSIS — R498 Other voice and resonance disorders: Secondary | ICD-10-CM | POA: Diagnosis not present

## 2018-01-01 DIAGNOSIS — I4891 Unspecified atrial fibrillation: Secondary | ICD-10-CM | POA: Diagnosis not present

## 2018-01-01 DIAGNOSIS — G7000 Myasthenia gravis without (acute) exacerbation: Secondary | ICD-10-CM | POA: Diagnosis not present

## 2018-01-01 DIAGNOSIS — J690 Pneumonitis due to inhalation of food and vomit: Secondary | ICD-10-CM | POA: Diagnosis not present

## 2018-01-01 DIAGNOSIS — J189 Pneumonia, unspecified organism: Secondary | ICD-10-CM | POA: Diagnosis not present

## 2018-01-03 DIAGNOSIS — L97819 Non-pressure chronic ulcer of other part of right lower leg with unspecified severity: Secondary | ICD-10-CM | POA: Diagnosis not present

## 2018-01-03 DIAGNOSIS — I70235 Atherosclerosis of native arteries of right leg with ulceration of other part of foot: Secondary | ICD-10-CM | POA: Diagnosis not present

## 2018-01-05 DIAGNOSIS — J189 Pneumonia, unspecified organism: Secondary | ICD-10-CM | POA: Diagnosis not present

## 2018-01-05 DIAGNOSIS — G7000 Myasthenia gravis without (acute) exacerbation: Secondary | ICD-10-CM | POA: Diagnosis not present

## 2018-01-05 DIAGNOSIS — I4891 Unspecified atrial fibrillation: Secondary | ICD-10-CM | POA: Diagnosis not present

## 2018-01-08 DIAGNOSIS — R197 Diarrhea, unspecified: Secondary | ICD-10-CM | POA: Diagnosis not present

## 2018-01-08 DIAGNOSIS — J690 Pneumonitis due to inhalation of food and vomit: Secondary | ICD-10-CM | POA: Diagnosis not present

## 2018-01-08 DIAGNOSIS — G7000 Myasthenia gravis without (acute) exacerbation: Secondary | ICD-10-CM | POA: Diagnosis not present

## 2018-01-08 DIAGNOSIS — R110 Nausea: Secondary | ICD-10-CM | POA: Diagnosis not present

## 2018-01-09 DIAGNOSIS — D508 Other iron deficiency anemias: Secondary | ICD-10-CM | POA: Diagnosis not present

## 2018-01-09 DIAGNOSIS — Z79899 Other long term (current) drug therapy: Secondary | ICD-10-CM | POA: Diagnosis not present

## 2018-01-09 DIAGNOSIS — I70235 Atherosclerosis of native arteries of right leg with ulceration of other part of foot: Secondary | ICD-10-CM | POA: Diagnosis not present

## 2018-01-10 DIAGNOSIS — E876 Hypokalemia: Secondary | ICD-10-CM | POA: Diagnosis not present

## 2018-01-10 DIAGNOSIS — R110 Nausea: Secondary | ICD-10-CM | POA: Diagnosis not present

## 2018-01-10 DIAGNOSIS — R197 Diarrhea, unspecified: Secondary | ICD-10-CM | POA: Diagnosis not present

## 2018-01-11 DIAGNOSIS — E876 Hypokalemia: Secondary | ICD-10-CM | POA: Diagnosis not present

## 2018-01-11 DIAGNOSIS — Z79899 Other long term (current) drug therapy: Secondary | ICD-10-CM | POA: Diagnosis not present

## 2018-01-11 DIAGNOSIS — I70235 Atherosclerosis of native arteries of right leg with ulceration of other part of foot: Secondary | ICD-10-CM | POA: Diagnosis not present

## 2018-01-11 DIAGNOSIS — L97812 Non-pressure chronic ulcer of other part of right lower leg with fat layer exposed: Secondary | ICD-10-CM | POA: Diagnosis not present

## 2018-01-15 DIAGNOSIS — R609 Edema, unspecified: Secondary | ICD-10-CM | POA: Diagnosis not present

## 2018-01-15 DIAGNOSIS — I4891 Unspecified atrial fibrillation: Secondary | ICD-10-CM | POA: Diagnosis not present

## 2018-01-15 DIAGNOSIS — G7000 Myasthenia gravis without (acute) exacerbation: Secondary | ICD-10-CM | POA: Diagnosis not present

## 2018-01-15 DIAGNOSIS — R05 Cough: Secondary | ICD-10-CM | POA: Diagnosis not present

## 2018-01-16 DIAGNOSIS — R05 Cough: Secondary | ICD-10-CM | POA: Diagnosis not present

## 2018-01-16 DIAGNOSIS — R0989 Other specified symptoms and signs involving the circulatory and respiratory systems: Secondary | ICD-10-CM | POA: Diagnosis not present

## 2018-01-17 DIAGNOSIS — I70235 Atherosclerosis of native arteries of right leg with ulceration of other part of foot: Secondary | ICD-10-CM | POA: Diagnosis not present

## 2018-01-18 DIAGNOSIS — I10 Essential (primary) hypertension: Secondary | ICD-10-CM | POA: Diagnosis not present

## 2018-01-18 DIAGNOSIS — G7000 Myasthenia gravis without (acute) exacerbation: Secondary | ICD-10-CM | POA: Diagnosis not present

## 2018-01-18 DIAGNOSIS — I4891 Unspecified atrial fibrillation: Secondary | ICD-10-CM | POA: Diagnosis not present

## 2018-01-18 DIAGNOSIS — J189 Pneumonia, unspecified organism: Secondary | ICD-10-CM | POA: Diagnosis not present

## 2018-01-23 DIAGNOSIS — I70235 Atherosclerosis of native arteries of right leg with ulceration of other part of foot: Secondary | ICD-10-CM | POA: Diagnosis not present

## 2018-01-24 ENCOUNTER — Emergency Department (HOSPITAL_COMMUNITY): Payer: Medicare HMO

## 2018-01-24 ENCOUNTER — Other Ambulatory Visit: Payer: Self-pay

## 2018-01-24 ENCOUNTER — Encounter (HOSPITAL_COMMUNITY): Payer: Self-pay | Admitting: Emergency Medicine

## 2018-01-24 ENCOUNTER — Inpatient Hospital Stay (HOSPITAL_COMMUNITY)
Admission: EM | Admit: 2018-01-24 | Discharge: 2018-01-30 | DRG: 871 | Disposition: A | Discharge status: Hospice | Payer: Medicare HMO | Attending: Internal Medicine | Admitting: Internal Medicine

## 2018-01-24 DIAGNOSIS — G8929 Other chronic pain: Secondary | ICD-10-CM | POA: Diagnosis present

## 2018-01-24 DIAGNOSIS — R0902 Hypoxemia: Secondary | ICD-10-CM

## 2018-01-24 DIAGNOSIS — J189 Pneumonia, unspecified organism: Secondary | ICD-10-CM | POA: Diagnosis not present

## 2018-01-24 DIAGNOSIS — H409 Unspecified glaucoma: Secondary | ICD-10-CM | POA: Diagnosis present

## 2018-01-24 DIAGNOSIS — R05 Cough: Secondary | ICD-10-CM | POA: Diagnosis not present

## 2018-01-24 DIAGNOSIS — G7000 Myasthenia gravis without (acute) exacerbation: Secondary | ICD-10-CM | POA: Diagnosis present

## 2018-01-24 DIAGNOSIS — Z515 Encounter for palliative care: Secondary | ICD-10-CM | POA: Diagnosis not present

## 2018-01-24 DIAGNOSIS — I110 Hypertensive heart disease with heart failure: Secondary | ICD-10-CM | POA: Diagnosis not present

## 2018-01-24 DIAGNOSIS — R1310 Dysphagia, unspecified: Secondary | ICD-10-CM | POA: Diagnosis not present

## 2018-01-24 DIAGNOSIS — Z66 Do not resuscitate: Secondary | ICD-10-CM | POA: Diagnosis not present

## 2018-01-24 DIAGNOSIS — E039 Hypothyroidism, unspecified: Secondary | ICD-10-CM | POA: Diagnosis present

## 2018-01-24 DIAGNOSIS — Z993 Dependence on wheelchair: Secondary | ICD-10-CM

## 2018-01-24 DIAGNOSIS — Z7951 Long term (current) use of inhaled steroids: Secondary | ICD-10-CM | POA: Diagnosis not present

## 2018-01-24 DIAGNOSIS — Z9049 Acquired absence of other specified parts of digestive tract: Secondary | ICD-10-CM | POA: Diagnosis not present

## 2018-01-24 DIAGNOSIS — J449 Chronic obstructive pulmonary disease, unspecified: Secondary | ICD-10-CM | POA: Diagnosis not present

## 2018-01-24 DIAGNOSIS — R0602 Shortness of breath: Secondary | ICD-10-CM

## 2018-01-24 DIAGNOSIS — J90 Pleural effusion, not elsewhere classified: Secondary | ICD-10-CM | POA: Diagnosis not present

## 2018-01-24 DIAGNOSIS — I959 Hypotension, unspecified: Secondary | ICD-10-CM | POA: Diagnosis not present

## 2018-01-24 DIAGNOSIS — E876 Hypokalemia: Secondary | ICD-10-CM | POA: Diagnosis present

## 2018-01-24 DIAGNOSIS — I5022 Chronic systolic (congestive) heart failure: Secondary | ICD-10-CM | POA: Diagnosis not present

## 2018-01-24 DIAGNOSIS — J690 Pneumonitis due to inhalation of food and vomit: Secondary | ICD-10-CM | POA: Diagnosis not present

## 2018-01-24 DIAGNOSIS — K219 Gastro-esophageal reflux disease without esophagitis: Secondary | ICD-10-CM

## 2018-01-24 DIAGNOSIS — A419 Sepsis, unspecified organism: Principal | ICD-10-CM | POA: Diagnosis present

## 2018-01-24 DIAGNOSIS — H9190 Unspecified hearing loss, unspecified ear: Secondary | ICD-10-CM | POA: Diagnosis present

## 2018-01-24 DIAGNOSIS — R000 Tachycardia, unspecified: Secondary | ICD-10-CM | POA: Diagnosis not present

## 2018-01-24 DIAGNOSIS — Z79899 Other long term (current) drug therapy: Secondary | ICD-10-CM

## 2018-01-24 DIAGNOSIS — R0600 Dyspnea, unspecified: Secondary | ICD-10-CM

## 2018-01-24 DIAGNOSIS — Z8701 Personal history of pneumonia (recurrent): Secondary | ICD-10-CM

## 2018-01-24 DIAGNOSIS — R001 Bradycardia, unspecified: Secondary | ICD-10-CM | POA: Diagnosis not present

## 2018-01-24 DIAGNOSIS — J9612 Chronic respiratory failure with hypercapnia: Secondary | ICD-10-CM | POA: Diagnosis not present

## 2018-01-24 DIAGNOSIS — Z96653 Presence of artificial knee joint, bilateral: Secondary | ICD-10-CM | POA: Diagnosis present

## 2018-01-24 DIAGNOSIS — I4821 Permanent atrial fibrillation: Secondary | ICD-10-CM | POA: Diagnosis present

## 2018-01-24 DIAGNOSIS — E785 Hyperlipidemia, unspecified: Secondary | ICD-10-CM | POA: Diagnosis present

## 2018-01-24 DIAGNOSIS — J9601 Acute respiratory failure with hypoxia: Secondary | ICD-10-CM | POA: Diagnosis present

## 2018-01-24 DIAGNOSIS — Z8261 Family history of arthritis: Secondary | ICD-10-CM

## 2018-01-24 DIAGNOSIS — Z7901 Long term (current) use of anticoagulants: Secondary | ICD-10-CM

## 2018-01-24 DIAGNOSIS — Z8249 Family history of ischemic heart disease and other diseases of the circulatory system: Secondary | ICD-10-CM

## 2018-01-24 DIAGNOSIS — M170 Bilateral primary osteoarthritis of knee: Secondary | ICD-10-CM | POA: Diagnosis present

## 2018-01-24 DIAGNOSIS — Z7189 Other specified counseling: Secondary | ICD-10-CM | POA: Diagnosis not present

## 2018-01-24 DIAGNOSIS — I4891 Unspecified atrial fibrillation: Secondary | ICD-10-CM | POA: Diagnosis not present

## 2018-01-24 DIAGNOSIS — Z79890 Hormone replacement therapy (postmenopausal): Secondary | ICD-10-CM

## 2018-01-24 LAB — I-STAT VENOUS BLOOD GAS, ED
ACID-BASE EXCESS: 6 mmol/L — AB (ref 0.0–2.0)
BICARBONATE: 33.4 mmol/L — AB (ref 20.0–28.0)
O2 Saturation: 85 %
PH VEN: 7.371 (ref 7.250–7.430)
TCO2: 35 mmol/L — ABNORMAL HIGH (ref 22–32)
pCO2, Ven: 57.7 mmHg (ref 44.0–60.0)
pO2, Ven: 53 mmHg — ABNORMAL HIGH (ref 32.0–45.0)

## 2018-01-24 LAB — INFLUENZA PANEL BY PCR (TYPE A & B)
Influenza A By PCR: NEGATIVE
Influenza B By PCR: NEGATIVE

## 2018-01-24 LAB — CBC WITH DIFFERENTIAL/PLATELET
Abs Immature Granulocytes: 0.05 10*3/uL (ref 0.00–0.07)
Basophils Absolute: 0.1 10*3/uL (ref 0.0–0.1)
Basophils Relative: 0 %
EOS ABS: 0.1 10*3/uL (ref 0.0–0.5)
Eosinophils Relative: 1 %
HEMATOCRIT: 36.1 % — AB (ref 39.0–52.0)
HEMOGLOBIN: 10.8 g/dL — AB (ref 13.0–17.0)
IMMATURE GRANULOCYTES: 0 %
LYMPHS ABS: 0.2 10*3/uL — AB (ref 0.7–4.0)
Lymphocytes Relative: 1 %
MCH: 27 pg (ref 26.0–34.0)
MCHC: 29.9 g/dL — ABNORMAL LOW (ref 30.0–36.0)
MCV: 90.3 fL (ref 80.0–100.0)
MONOS PCT: 4 %
Monocytes Absolute: 0.5 10*3/uL (ref 0.1–1.0)
NEUTROS PCT: 94 %
Neutro Abs: 14.2 10*3/uL — ABNORMAL HIGH (ref 1.7–7.7)
Platelets: 174 10*3/uL (ref 150–400)
RBC: 4 MIL/uL — ABNORMAL LOW (ref 4.22–5.81)
RDW: 19.3 % — AB (ref 11.5–15.5)
WBC: 15.1 10*3/uL — ABNORMAL HIGH (ref 4.0–10.5)
nRBC: 0 % (ref 0.0–0.2)

## 2018-01-24 LAB — I-STAT ARTERIAL BLOOD GAS, ED
Acid-Base Excess: 6 mmol/L — ABNORMAL HIGH (ref 0.0–2.0)
Bicarbonate: 31.6 mmol/L — ABNORMAL HIGH (ref 20.0–28.0)
O2 SAT: 91 %
PCO2 ART: 47.9 mmHg (ref 32.0–48.0)
PH ART: 7.429 (ref 7.350–7.450)
PO2 ART: 62 mmHg — AB (ref 83.0–108.0)
Patient temperature: 99.2
TCO2: 33 mmol/L — ABNORMAL HIGH (ref 22–32)

## 2018-01-24 LAB — MRSA PCR SCREENING: MRSA by PCR: NEGATIVE

## 2018-01-24 LAB — COMPREHENSIVE METABOLIC PANEL
ALBUMIN: 2.3 g/dL — AB (ref 3.5–5.0)
ALK PHOS: 138 U/L — AB (ref 38–126)
ALT: 13 U/L (ref 0–44)
ANION GAP: 12 (ref 5–15)
AST: 27 U/L (ref 15–41)
BUN: 9 mg/dL (ref 8–23)
CALCIUM: 7.2 mg/dL — AB (ref 8.9–10.3)
CO2: 31 mmol/L (ref 22–32)
CREATININE: 0.96 mg/dL (ref 0.61–1.24)
Chloride: 97 mmol/L — ABNORMAL LOW (ref 98–111)
GFR calc Af Amer: >60 mL/min (ref >60)
GFR calc non Af Amer: >60 mL/min (ref >60)
GLUCOSE: 125 mg/dL — AB (ref 70–99)
Potassium: 2.2 mmol/L — CL (ref 3.5–5.1)
SODIUM: 140 mmol/L (ref 135–145)
Total Bilirubin: 1.2 mg/dL (ref 0.3–1.2)
Total Protein: 5.2 g/dL — ABNORMAL LOW (ref 6.5–8.1)

## 2018-01-24 LAB — I-STAT CG4 LACTIC ACID, ED: Lactic Acid, Venous: 1.79 mmol/L (ref 0.5–1.9)

## 2018-01-24 MED ORDER — METOPROLOL TARTRATE 25 MG PO TABS
25.0000 mg | ORAL_TABLET | Freq: Two times a day (BID) | ORAL | Status: DC
  Administered 2018-01-24 – 2018-01-29 (×11): 25 mg via ORAL
  Filled 2018-01-24 (×12): qty 1

## 2018-01-24 MED ORDER — ONDANSETRON HCL 4 MG/2ML IJ SOLN: 4.0000 mg | Freq: Four times a day (QID) | INTRAMUSCULAR | Status: DC | PRN

## 2018-01-24 MED ORDER — POTASSIUM CHLORIDE 10 MEQ/100ML IV SOLN
10.0000 meq | INTRAVENOUS | Status: AC
  Administered 2018-01-24 (×3): 10 meq via INTRAVENOUS
  Filled 2018-01-24 (×3): qty 100

## 2018-01-24 MED ORDER — ENSURE ENLIVE PO LIQD: 237.0000 mL | Freq: Two times a day (BID) | ORAL | Status: DC

## 2018-01-24 MED ORDER — ACETAMINOPHEN 325 MG PO TABS
650.0000 mg | ORAL_TABLET | Freq: Once | ORAL | Status: AC
  Administered 2018-01-24: 650 mg via ORAL
  Filled 2018-01-24: qty 2

## 2018-01-24 MED ORDER — MORPHINE SULFATE ER 15 MG PO TBCR
15.0000 mg | EXTENDED_RELEASE_TABLET | Freq: Two times a day (BID) | ORAL | Status: DC
  Administered 2018-01-24 – 2018-01-29 (×11): 15 mg via ORAL
  Filled 2018-01-24 (×13): qty 1

## 2018-01-24 MED ORDER — SENNA 8.6 MG PO TABS: 1.0000 | ORAL_TABLET | Freq: Every evening | ORAL | Status: DC | PRN

## 2018-01-24 MED ORDER — SODIUM CHLORIDE 0.9 % IV SOLN
500.0000 mg | INTRAVENOUS | Status: DC
  Administered 2018-01-24: 500 mg via INTRAVENOUS
  Filled 2018-01-24: qty 500

## 2018-01-24 MED ORDER — SODIUM CHLORIDE 0.9 % IV SOLN
INTRAVENOUS | Status: AC
  Administered 2018-01-24: 18:00:00 via INTRAVENOUS

## 2018-01-24 MED ORDER — TEMAZEPAM 15 MG PO CAPS
30.0000 mg | ORAL_CAPSULE | Freq: Every evening | ORAL | Status: DC | PRN
  Administered 2018-01-25 – 2018-01-26 (×2): 30 mg via ORAL
  Filled 2018-01-24 (×2): qty 2

## 2018-01-24 MED ORDER — PIPERACILLIN-TAZOBACTAM 3.375 G IVPB 30 MIN: 3.3750 g | Freq: Four times a day (QID) | INTRAVENOUS | Status: DC

## 2018-01-24 MED ORDER — MOMETASONE FURO-FORMOTEROL FUM 100-5 MCG/ACT IN AERO
2.0000 | INHALATION_SPRAY | Freq: Two times a day (BID) | RESPIRATORY_TRACT | Status: DC
  Administered 2018-01-24 – 2018-01-30 (×12): 2 via RESPIRATORY_TRACT
  Filled 2018-01-24: qty 8.8

## 2018-01-24 MED ORDER — CALCIUM POLYCARBOPHIL 625 MG PO TABS
625.0000 mg | ORAL_TABLET | Freq: Every day | ORAL | Status: DC
  Administered 2018-01-24 – 2018-01-28 (×5): 625 mg via ORAL
  Filled 2018-01-24 (×6): qty 1

## 2018-01-24 MED ORDER — ACETAMINOPHEN 325 MG PO TABS: 650.0000 mg | ORAL_TABLET | Freq: Four times a day (QID) | ORAL | Status: DC | PRN

## 2018-01-24 MED ORDER — SERTRALINE HCL 50 MG PO TABS
50.0000 mg | ORAL_TABLET | Freq: Every day | ORAL | Status: DC
  Administered 2018-01-25 – 2018-01-30 (×6): 50 mg via ORAL
  Filled 2018-01-24 (×6): qty 1

## 2018-01-24 MED ORDER — SODIUM CHLORIDE 0.9 % IV SOLN
2.0000 g | Freq: Once | INTRAVENOUS | Status: AC
  Administered 2018-01-24: 2 g via INTRAVENOUS
  Filled 2018-01-24: qty 2

## 2018-01-24 MED ORDER — IPRATROPIUM-ALBUTEROL 0.5-2.5 (3) MG/3ML IN SOLN: 3.0000 mL | Freq: Four times a day (QID) | RESPIRATORY_TRACT | Status: DC | PRN

## 2018-01-24 MED ORDER — PREGABALIN 25 MG PO CAPS: 25.0000 mg | ORAL_CAPSULE | Freq: Every day | ORAL | Status: DC

## 2018-01-24 MED ORDER — LATANOPROST 0.005 % OP SOLN
1.0000 [drp] | Freq: Every day | OPHTHALMIC | Status: DC
  Administered 2018-01-24 – 2018-01-28 (×5): 1 [drp] via OPHTHALMIC
  Filled 2018-01-24 (×2): qty 2.5

## 2018-01-24 MED ORDER — HYDROXYZINE HCL 10 MG PO TABS
10.0000 mg | ORAL_TABLET | Freq: Three times a day (TID) | ORAL | Status: DC | PRN
  Administered 2018-01-26: 10 mg via ORAL
  Filled 2018-01-24 (×2): qty 1

## 2018-01-24 MED ORDER — AZATHIOPRINE 50 MG PO TABS
50.0000 mg | ORAL_TABLET | Freq: Two times a day (BID) | ORAL | Status: DC
  Administered 2018-01-25 – 2018-01-30 (×11): 50 mg via ORAL
  Filled 2018-01-24 (×13): qty 1

## 2018-01-24 MED ORDER — BRINZOLAMIDE 1 % OP SUSP
1.0000 [drp] | Freq: Two times a day (BID) | OPHTHALMIC | Status: DC
  Administered 2018-01-25 – 2018-01-30 (×11): 1 [drp] via OPHTHALMIC
  Filled 2018-01-24 (×2): qty 10

## 2018-01-24 MED ORDER — DOCUSATE SODIUM 100 MG PO CAPS
100.0000 mg | ORAL_CAPSULE | Freq: Two times a day (BID) | ORAL | Status: DC
  Administered 2018-01-24 – 2018-01-29 (×9): 100 mg via ORAL
  Filled 2018-01-24 (×11): qty 1

## 2018-01-24 MED ORDER — LEVOTHYROXINE SODIUM 200 MCG PO TABS
200.0000 ug | ORAL_TABLET | Freq: Every day | ORAL | Status: DC
  Administered 2018-01-25 – 2018-01-30 (×6): 200 ug via ORAL
  Filled 2018-01-24: qty 2
  Filled 2018-01-24: qty 1
  Filled 2018-01-24: qty 2
  Filled 2018-01-24: qty 1
  Filled 2018-01-24 (×2): qty 2
  Filled 2018-01-24 (×2): qty 1
  Filled 2018-01-24: qty 2
  Filled 2018-01-24 (×2): qty 1
  Filled 2018-01-24: qty 2

## 2018-01-24 MED ORDER — POTASSIUM CHLORIDE 10 MEQ/100ML IV SOLN
10.0000 meq | Freq: Once | INTRAVENOUS | Status: AC
  Administered 2018-01-24: 10 meq via INTRAVENOUS
  Filled 2018-01-24: qty 100

## 2018-01-24 MED ORDER — BRIMONIDINE TARTRATE 0.2 % OP SOLN
1.0000 [drp] | Freq: Two times a day (BID) | OPHTHALMIC | Status: DC
  Administered 2018-01-24 – 2018-01-30 (×12): 1 [drp] via OPHTHALMIC
  Filled 2018-01-24 (×2): qty 5

## 2018-01-24 MED ORDER — STARCH (THICKENING) PO POWD
ORAL | Status: DC | PRN
  Filled 2018-01-24 (×3): qty 227

## 2018-01-24 MED ORDER — ATORVASTATIN CALCIUM 10 MG PO TABS
10.0000 mg | ORAL_TABLET | Freq: Every evening | ORAL | Status: DC
  Administered 2018-01-24 – 2018-01-29 (×6): 10 mg via ORAL
  Filled 2018-01-24 (×6): qty 1

## 2018-01-24 MED ORDER — FUROSEMIDE 20 MG PO TABS: 40.0000 mg | ORAL_TABLET | Freq: Every day | ORAL | Status: DC

## 2018-01-24 MED ORDER — ORAL CARE MOUTH RINSE
15.0000 mL | Freq: Two times a day (BID) | OROMUCOSAL | Status: DC
  Administered 2018-01-25 – 2018-01-30 (×6): 15 mL via OROMUCOSAL

## 2018-01-24 MED ORDER — MAGNESIUM HYDROXIDE 400 MG/5ML PO SUSP: 30.0000 mL | Freq: Every day | ORAL | Status: DC | PRN

## 2018-01-24 MED ORDER — METOPROLOL SUCCINATE ER 100 MG PO TB24: 100.0000 mg | ORAL_TABLET | Freq: Every day | ORAL | Status: DC

## 2018-01-24 MED ORDER — LOSARTAN POTASSIUM 25 MG PO TABS
12.5000 mg | ORAL_TABLET | Freq: Every day | ORAL | Status: DC
  Administered 2018-01-25 – 2018-01-26 (×2): 12.5 mg via ORAL
  Filled 2018-01-24 (×2): qty 1

## 2018-01-24 MED ORDER — APIXABAN 5 MG PO TABS
5.0000 mg | ORAL_TABLET | Freq: Two times a day (BID) | ORAL | Status: DC
  Administered 2018-01-24 – 2018-01-30 (×12): 5 mg via ORAL
  Filled 2018-01-24 (×12): qty 1

## 2018-01-24 MED ORDER — SODIUM CHLORIDE 0.9 % IV BOLUS
250.0000 mL | Freq: Once | INTRAVENOUS | Status: AC
  Administered 2018-01-24: 250 mL via INTRAVENOUS

## 2018-01-24 MED ORDER — PIPERACILLIN-TAZOBACTAM 3.375 G IVPB
3.3750 g | Freq: Three times a day (TID) | INTRAVENOUS | Status: DC
  Administered 2018-01-24 – 2018-01-30 (×18): 3.375 g via INTRAVENOUS
  Filled 2018-01-24 (×19): qty 50

## 2018-01-24 MED ORDER — SODIUM CHLORIDE 0.9 % IV SOLN: 2.0000 g | Freq: Two times a day (BID) | INTRAVENOUS | Status: DC

## 2018-01-24 MED ORDER — FLUTICASONE PROPIONATE 50 MCG/ACT NA SUSP
1.0000 | Freq: Every day | NASAL | Status: DC
  Administered 2018-01-25 – 2018-01-30 (×6): 1 via NASAL
  Filled 2018-01-24: qty 16

## 2018-01-24 MED ORDER — POTASSIUM CHLORIDE CRYS ER 20 MEQ PO TBCR: 40.0000 meq | EXTENDED_RELEASE_TABLET | Freq: Once | ORAL | Status: DC

## 2018-01-24 MED ORDER — ONDANSETRON HCL 4 MG PO TABS: 4.0000 mg | ORAL_TABLET | Freq: Four times a day (QID) | ORAL | Status: DC | PRN

## 2018-01-24 MED ORDER — SODIUM CHLORIDE 0.9 % IV BOLUS
500.0000 mL | Freq: Once | INTRAVENOUS | Status: AC
  Administered 2018-01-24: 500 mL via INTRAVENOUS

## 2018-01-24 MED ORDER — GUAIFENESIN ER 600 MG PO TB12
600.0000 mg | ORAL_TABLET | Freq: Two times a day (BID) | ORAL | Status: DC
  Administered 2018-01-24 – 2018-01-25 (×2): 600 mg via ORAL
  Filled 2018-01-24 (×2): qty 1

## 2018-01-24 MED ORDER — PANTOPRAZOLE SODIUM 40 MG PO TBEC
40.0000 mg | DELAYED_RELEASE_TABLET | Freq: Two times a day (BID) | ORAL | Status: DC
  Administered 2018-01-24 – 2018-01-29 (×11): 40 mg via ORAL
  Filled 2018-01-24 (×12): qty 1

## 2018-01-24 MED ORDER — SODIUM CHLORIDE 0.9 % IV SOLN: 1.0000 g | Freq: Once | INTRAVENOUS | Status: DC

## 2018-01-24 NOTE — ED Provider Notes (Signed)
Medical screening examination/treatment/procedure(s) were conducted as a shared visit with non-physician practitioner(s) and myself.  I personally evaluated the patient during the encounter.  84 year old with multiple past medical problems presents the emergency department from facility secondary to pneumonia diagnosed on chest x-ray also with a rapid heart rate.  Patient is somewhat disoriented but on examination has diffuse crackles and rhonchi in bilateral lungs.  Is tachycardic.  Tachypneic.  Has oral tips of 9.2 but suspect the tire.  Blood pressure stable.  He has a most form that states he is DNR/DNI.  CRITICAL CARE Performed by: Merrily Pew Total critical care time: 35 minutes Critical care time was exclusive of separately billable procedures and treating other patients. Critical care was necessary to treat or prevent imminent or life-threatening deterioration. Critical care was time spent personally by me on the following activities: development of treatment plan with patient and/or surrogate as well as nursing, discussions with consultants, evaluation of patient's response to treatment, examination of patient, obtaining history from patient or surrogate, ordering and performing treatments and interventions, ordering and review of laboratory studies, ordering and review of radiographic studies, pulse oximetry and re-evaluation of patient's condition.  I suspect some of his elevated heart rate is related to hypovolemia, infection and fever.  Will give a little bit of fluid, antipyretics and antibiotics and hold on rate/rhythm controlling agents for time being..  EKG Interpretation  Date/Time:  Wednesday January 24 2018 12:02:31 EST Ventricular Rate:  164 PR Interval:    QRS Duration: 106 QT Interval:  300 QTC Calculation: 520 R Axis:   -24 Text Interpretation:  Atrial fibrillation with rapid V-rate Borderline left axis deviation Repolarization abnormality, prob rate related faster  rate than previously Confirmed by Merrily Pew [phone]) on 01/24/2018 3:28:02 PM      Dorothee Napierkowski, Corene Cornea, MD 01/25/18 [phone]

## 2018-01-24 NOTE — ED Notes (Signed)
PA-C at bedside talking with patient and family.

## 2018-01-24 NOTE — Progress Notes (Signed)
Pharmacy Antibiotic Note  Travis Ross is a 84 y.o. male admitted on 01/24/2018 with pneumonia.  Pharmacy has been consulted for cefepime dosing. SCr 0.96, WBC 15.1, tachy.   Plan: Cefepime 2g IV every 12 hours F/u LOT, ability to narrow    Temp (24hrs), Avg:99.2 F (37.3 C), Min:99.2 F (37.3 C), Max:99.2 F (37.3 C)  No results for input(s): WBC, CREATININE, LATICACIDVEN, VANCOTROUGH, VANCOPEAK, VANCORANDOM, GENTTROUGH, GENTPEAK, GENTRANDOM, TOBRATROUGH, TOBRAPEAK, TOBRARND, AMIKACINPEAK, AMIKACINTROU, AMIKACIN in the last 168 hours.  CrCl cannot be calculated (Patient's most recent lab result is older than the maximum 21 days allowed.).    No Known Allergies  Antimicrobials this admission: Cefepime 11/27>> azithro 11/27>>  Dose adjustments this admission: n/a  Microbiology results: 11/27 BCx: sent   Bertis Ruddy, PharmD Clinical Pharmacist Please check AMION for all [place] numbers 01/24/2018 12:17 PM

## 2018-01-24 NOTE — H&P (Addendum)
History and Physical    DOA: 01/24/2018  PCP: [name], MD  Patient coming from: Skilled nursing facility  Chief Complaint: Cough shortness of breath and hypoxia  HPI: Travis Ross is a 84 y.o. male with history h/o COPD, emphysema, hypertension, GERD, hyperlipidemia, hypothyroidism, chronic atrial fibrillation on anticoagulation, hearing loss and myasthenia gravis with chronic dysphagia who resides at skilled nursing facility and has had recurrent admissions for aspiration pneumonia (in December 2018, June 2019 and September 2019) brought in due to shortness of breath, hypoxia,persistent cough and failure of outpatient treatment with Levaquin for pneumonia.  Most of the history obtained by talking to daughters bedside.  According to daughter when patient was admitted in June, he was advised mechanical soft diet with thickened liquids but he did not want to follow dietary restrictions and chose to eat regular diet with thin liquids while understanding risk of recurrent aspiration.  Family also reports history of diarrhea about 10 days back with negative stool cultures at SNF which resolved spontaneously.  No report of melena or hematochezia or vomiting. Patient is wheelchair-bound at baseline due to bilateral knee arthritis/old right femur fracture and chronic muscle weakness.  He however is mentally sound and able to communicate according to family members although currently appears somnolent and tired. Work-up in the ED revealed WBC 15 K, bibasilar pneumonia on chest x-ray, O2 sat 85% on room air.  He received cefepime and azithromycin in the ED.  Review of Systems: As per HPI otherwise 10 point review of systems negative.    Past Medical History:  Diagnosis Date  . CAP (community acquired pneumonia) 02/04/2017  . COPD with chronic bronchitis and emphysema ([place])   . Essential hypertension   . Femur fracture, right ([place])    Related to a fall. Periprosthetic distal femur fracture  (close to the right knee prosthesis).  > Initial plans were to treat with brace, however, has now progressed and may require surgery  . GERD (gastroesophageal reflux disease)   . Glaucoma   . History of Cervical spinal cord compression w/ residual hand parestheisa   . Hyperlipidemia   . Hypothyroidism (acquired)    On Levothyroxine  . Myasthenia gravis ([place])    Now (prior to recent femur fracture) was relegated to maneuvering himself around on a Rolling Walker (Rollator) using his feet & sitting on the walker.  Does not have arm strength to mover a wheelchair or transfer.;   . Osteoarthritis    Global osteoarthritis involving back, hips and knees as well as elbows.  . Paresthesia    bilateral upper extremities, very weak ; poor grip  (must use adaptable silverware );   . Permanent atrial fibrillation    Long-standing (> 4 yrs). Initially evaluated with Stress Test & Echo - Pt reports that these studies were "normal"    Past Surgical History:  Procedure Laterality Date  . BACK SURGERY     x 6  . CARPAL TUNNEL RELEASE    . CHOLECYSTECTOMY    . ELBOW SURGERY  08/2015   Total of 3 surgeries  . REPLACEMENT TOTAL KNEE Bilateral    Right TKA 2003, left TKA 2010    . TOTAL KNEE REVISION Right 12/15/2016   Procedure: RIGHT TOTAL KNEE REVISION;  Surgeon: Gaynelle Arabian, MD;  Location: [facility];  Service: Orthopedics;  Laterality: Right;    Social history:  reports that he has never smoked. He has never used smokeless tobacco. He reports that he does not drink alcohol or use  drugs.   No Known Allergies  Family History  Problem Relation Age of Onset  . Other Mother        Polio  . Heart failure Father   . Arthritis Sister   . Arthritis Brother       Prior to Admission medications   Medication Sig Start Date End Date Taking? Authorizing Provider  Amino Acids-Protein Hydrolys (FEEDING SUPPLEMENT, PRO-STAT SUGAR FREE 64,) LIQD Take 30 mLs by mouth 3 (three) times daily with meals.    Yes [provider]  apixaban (ELIQUIS) 5 MG TABS tablet Take 1 tablet (5 mg total) by mouth 2 (two) times daily. 08/25/17  Yes Oretha Milch D, MD  atorvastatin (LIPITOR) 10 MG tablet Take 10 mg by mouth every evening.  10/28/16  Yes [provider]  azaTHIOprine (IMURAN) 50 MG tablet Take 50 mg by mouth 2 (two) times daily.   Yes [provider]  benzonatate (TESSALON) 100 MG capsule Take 100 mg by mouth at bedtime.   Yes [provider]  Brinzolamide-Brimonidine (SIMBRINZA) 1-0.2 % SUSP Place 1 drop into both eyes 2 (two) times daily.   Yes [provider]  chlorhexidine (PERIDEX) 0.12 % solution Use as directed 15 mLs in the mouth or throat 2 (two) times daily. Swish and spit   Yes [provider]  DULERA 100-5 MCG/ACT AERO Take 2 puffs by mouth 2 (two) times daily.  10/10/16  Yes [provider]  fluticasone (FLONASE) 50 MCG/ACT nasal spray Place 1 spray into both nostrils daily.   Yes [provider]  furosemide (LASIX) 40 MG tablet Take 40 mg by mouth daily. 11/09/16  Yes [provider]  GUAIFENESIN ER PO Take 400 mg by mouth 2 (two) times daily.    Yes [provider]  Ipratropium-Albuterol (COMBIVENT RESPIMAT) 20-100 MCG/ACT AERS respimat Inhale 2 puffs into the lungs 2 (two) times daily.    Yes [provider]  ipratropium-albuterol (DUONEB) 0.5-2.5 (3) MG/3ML SOLN Take 3 mLs by nebulization every 6 (six) hours as needed (Wheezing).   Yes [provider]  levothyroxine (SYNTHROID, LEVOTHROID) 200 MCG tablet Take 200 mcg by mouth daily.  10/31/16  Yes [provider]  losartan (COZAAR) 25 MG tablet Take 0.5 tablets (12.5 mg total) by mouth daily. Patient taking differently: Take 12.5 mg by mouth daily. Hold for systolic blood pressure lower that 892 and dystolic lower than 60 11/94/17  Yes Winfrey, Jenne Pane, MD  magnesium hydroxide (MILK OF MAGNESIA) 400 MG/5ML suspension Take 30  mLs by mouth See admin instructions. every 24 hours as needed for constipation.   Yes [provider]  metoprolol tartrate (LOPRESSOR) 25 MG tablet Take 25 mg by mouth 2 (two) times daily. Hold if EYC<144 or diastolic BP> 60   Yes [provider]  morphine (MS CONTIN) 15 MG 12 hr tablet Take 15 mg by mouth 2 (two) times daily. For pain 10/31/17  Yes [provider]  ondansetron (ZOFRAN) 4 MG tablet Take 4 mg by mouth every 4 (four) hours as needed for nausea.  01/11/18  Yes [provider]  oxyCODONE (OXY IR/ROXICODONE) 5 MG immediate release tablet Take 5 mg by mouth 3 (three) times daily. Hold if sleepy or confused 01/15/18  Yes [provider]  pantoprazole (PROTONIX) 40 MG tablet Take 40 mg by mouth 2 (two) times daily.  10/28/16  Yes [provider]  polycarbophil (FIBERCON) 625 MG tablet Take 1 tablet (625 mg total) by mouth at  bedtime. 12/20/16  Yes Perkins, Alexzandrew L, PA-C  pregabalin (LYRICA) 25 MG capsule Take 25 mg by mouth daily. 10/25/17  Yes [provider]  SANTYL ointment Apply 1 application topically daily. Apply ointment nickel Thickness amount to right great distal toe ulcer daily 12/17/17  Yes [provider]  senna (SENOKOT) 8.6 MG tablet Take 1 tablet by mouth every evening.   Yes [provider]  sertraline (ZOLOFT) 50 MG tablet Take 50 mg by mouth daily.  10/28/16  Yes [provider]  temazepam (RESTORIL) 30 MG capsule Take 30 mg by mouth at bedtime.  11/09/16  Yes [provider]  TRAVATAN Z 0.004 % SOLN ophthalmic solution Place 1 drop into both eyes at bedtime.  11/09/16  Yes [provider]  acetaminophen (TYLENOL) 325 MG tablet Take 650 mg by mouth every 6 (six) hours as needed for moderate pain.    [provider]  docusate sodium (COLACE) 100 MG capsule Take 100 mg by mouth 2 (two) times daily.    [provider]  hydrOXYzine (ATARAX/VISTARIL) 10 MG  tablet Take 1 tablet (10 mg total) by mouth 3 (three) times daily as needed for itching. 11/06/17   Doreatha Lew, MD  lidocaine (LMX) 4 % cream Apply 1 application topically as needed. Apply to right side of face as needed for pain.    [provider]  mineral oil-hydrophilic petrolatum (AQUAPHOR) ointment Apply topically daily as needed for dry skin. 11/06/17   Doreatha Lew, MD  predniSONE (DELTASONE) 10 MG tablet Take 1 tablet (10 mg total) by mouth daily with breakfast. Take 40 mg for 3 more days, then 20 mg for 2 days, then 5 mg daily 11/06/17   Patrecia Pour, Christean Grief, MD  Psyllium (FIBER) 0.52 g CAPS Take 0.52 g by mouth at bedtime.    [provider]    Physical Exam: Vitals:   01/24/18 1545 01/24/18 1600 01/24/18 1615 01/24/18 1630  BP: 119/61 114/62 (!) 101/55 126/60  Pulse: 67 91 (!) 118   Resp: 15 17 16 15   Temp:      TempSrc:      SpO2: 98% 98% 95% 97%     Vitals:   01/24/18 1545 01/24/18 1600 01/24/18 1615 01/24/18 1630  BP: 119/61 114/62 (!) 101/55 126/60  Pulse: 67 91 (!) 118   Resp: 15 17 16 15   Temp:      TempSrc:      SpO2: 98% 98% 95% 97%  Constitutional: Somnolent, calm, comfortable.  No respiratory distress noted.  2 L nasal cannula Eyes: PERRL, lids and conjunctivae normal ENMT: Mucous membranes are moist. Posterior pharynx clear of any exudate or lesions.Normal dentition.  Neck: normal, supple, no masses, no thyromegaly Respiratory: Decreased breath sounds at bases on auscultation bilaterally, no wheezing, no crackles. Normal respiratory effort. No accessory muscle use.  Cardiovascular: Regular rate and rhythm, no murmurs / rubs / gallops.  No lower extremity edema. 2+ pedal pulses. No carotid bruits.  Abdomen: no tenderness, no masses palpated. No hepatosplenomegaly. Bowel sounds positive.  Musculoskeletal: no clubbing / cyanosis. No joint deformity upper and lower extremities. Neurologic: CN 2-12 grossly intact. Sensation intact, DTR  normal. Strength 5/5 in all 4.  Psychiatric: Could not assess as patient sleeping and minimally communicative SKIN/catheters: no rashes, lesions, ulcers. No induration  Labs on Admission: I have personally reviewed following labs and imaging studies  CBC: Recent Labs  Lab 01/24/18 1210  WBC 15.1*  NEUTROABS 14.2*  HGB  10.8*  HCT 36.1*  MCV 90.3  PLT 973   Basic Metabolic Panel: Recent Labs  Lab 01/24/18 1210  NA 140  K 2.2*  CL 97*  CO2 31  GLUCOSE 125*  BUN 9  CREATININE 0.96  CALCIUM 7.2*   GFR: CrCl cannot be calculated (Unknown ideal weight.). Liver Function Tests: Recent Labs  Lab 01/24/18 1210  AST 27  ALT 13  ALKPHOS 138*  BILITOT 1.2  PROT 5.2*  ALBUMIN 2.3*   No results for input(s): LIPASE, AMYLASE in the last 168 hours. No results for input(s): AMMONIA in the last 168 hours. Coagulation Profile: No results for input(s): INR, PROTIME in the last 168 hours. Cardiac Enzymes: No results for input(s): CKTOTAL, CKMB, CKMBINDEX, TROPONINI in the last 168 hours. BNP (last 3 results) No results for input(s): PROBNP in the last 8760 hours. HbA1C: No results for input(s): HGBA1C in the last 72 hours. CBG: No results for input(s): GLUCAP in the last 168 hours. Lipid Profile: No results for input(s): CHOL, HDL, LDLCALC, TRIG, CHOLHDL, LDLDIRECT in the last 72 hours. Thyroid Function Tests: No results for input(s): TSH, T4TOTAL, FREET4, T3FREE, THYROIDAB in the last 72 hours. Anemia Panel: No results for input(s): VITAMINB12, FOLATE, FERRITIN, TIBC, IRON, RETICCTPCT in the last 72 hours. Urine analysis:    Component Value Date/Time   COLORURINE YELLOW 08/16/2017 1038   APPEARANCEUR HAZY (A) 08/16/2017 1038   LABSPEC 1.010 08/16/2017 1038   PHURINE 5.0 08/16/2017 1038   GLUCOSEU NEGATIVE 08/16/2017 1038   HGBUR NEGATIVE 08/16/2017 1038   BILIRUBINUR NEGATIVE 08/16/2017 1038   KETONESUR NEGATIVE 08/16/2017 1038   PROTEINUR NEGATIVE 08/16/2017 1038    NITRITE NEGATIVE 08/16/2017 1038   LEUKOCYTESUR NEGATIVE 08/16/2017 1038    Radiological Exams on Admission: Dg Chest Portable 1 View  Result Date: 01/24/2018 CLINICAL DATA:  Cough and fever her EXAM: PORTABLE CHEST 1 VIEW COMPARISON:  January 16, 2018 FINDINGS: There is persistent airspace consolidation throughout the left lower lobe with small left pleural effusion. There is increase in patchy opacity in the right lower lobe, felt to represent progression of pneumonia in this area. There is a small right pleural effusion. There is cardiomegaly with pulmonary vascularity normal. No adenopathy. There is aortic atherosclerosis. No bone lesions. IMPRESSION: Bilateral lower lobe consolidation felt to represent pneumonia, more severe on the left than on the right. New versus progression of apparent pneumonia right lower lobe compared to prior study. Stable cardiomegaly. No adenopathy evident. There is aortic atherosclerosis. Aortic Atherosclerosis (ICD10-I70.0). Electronically Signed   By: Lowella Grip III M.D.   On: 01/24/2018 12:38    EKG: Independently reviewed.  Atrial fibrillation with rate related changes     Assessment and Plan:   1.  Recurrent aspiration pneumonia: Discussed at length with family regarding futility of hospitalizations and repeat antibiotic treatments if patient's choice is to continue high risk comfort feeds.  Family understands and willing to meet with palliative care team for further discolorations on care goals.  Will admit with IV antibiotics to cover aspiration organisms.  Repeat swallow evaluation for safer diet recommendations.  For now pured diet with honey thick liquids as requested by family understanding the risks.They have clearly stated that patient does not want feeding tubes.Patient also on multiple medications that can put him at aspiration risk like morphine, Lyrica, Zoloft, temazepam etc.  Resumed morphine but held Lyrica (as not started in the nursing  facility yet), changed Zoloft to 50 mg and temazepam to as needed only.  2.  Chronic atrial fibrillation: Resume anticoagulation and rate control medications.  3.  Chronic back pain/back surgeries: On scheduled morphine for years.  Will continue to avoid withdrawal.  4.  Chronic systolic heart failure: Patient on chronic diuretics.  Will hold Lasix for now as it appears dry in the setting of problem #1  5.  COPD/emphysema: Resume inhaler/neb treatment.  6.  Hypothyroidism: Resume Synthroid  7.  Myasthenia gravis: Resume home medications.  8.  Glaucoma: Resume eyedrops.  DVT prophylaxis: On anticoagulation  Code Status: DNR as confirmed with family  Family Communication: Discussed briefly with patient and in detail with daughters bedside. Health care proxy would be both daughters.  Recommended modified diet or to choose comfort measures in order to avoid recurrent hospitalizations Consults called: Palliative care, Speech therapy Admission status:  Patient admitted as inpatient as anticipated LOS greater than 2 midnights    Guilford Shi MD [facility] Pager [phone]  If 7PM-7AM, please contact night-coverage [email or web address] Password [facility]  01/24/2018, 5:23 PM

## 2018-01-24 NOTE — ED Provider Notes (Signed)
[facility] EMERGENCY DEPARTMENT Provider Note   CSN: [id number] Arrival date & time: 01/24/18  1156     History   Chief Complaint Chief Complaint  Patient presents with  . Pneumonia    HPI Travis Ross is a 84 y.o. male.  HPI   Travis Ross is a 84 y.o. male, with a history of COPD, HTN, myasthenia gravis, GERD, presenting to the ED from a local nursing facility with complaint of cough and shortness of breath for the past week.  Diagnosed with pneumonia at his facility and states he was started on antibiotics, but does not know the name of the antibiotic. EMS reports they were called for shortness of breath with room air SPO2 in the 80s, improved to 97% on 4 L supplemental O2.  Denies fever, chest pain, N/V/D, abdominal pain, or any other complaints.   Past Medical History:  Diagnosis Date  . CAP (community acquired pneumonia) 02/04/2017  . COPD with chronic bronchitis and emphysema ([place])   . Essential hypertension   . Femur fracture, right ([place])    Related to a fall. Periprosthetic distal femur fracture (close to the right knee prosthesis).  > Initial plans were to treat with brace, however, has now progressed and may require surgery  . GERD (gastroesophageal reflux disease)   . Glaucoma   . History of Cervical spinal cord compression w/ residual hand parestheisa   . Hyperlipidemia   . Hypothyroidism (acquired)    On Levothyroxine  . Myasthenia gravis ([place])    Now (prior to recent femur fracture) was relegated to maneuvering himself around on a Rolling Walker (Rollator) using his feet & sitting on the walker.  Does not have arm strength to mover a wheelchair or transfer.;   . Osteoarthritis    Global osteoarthritis involving back, hips and knees as well as elbows.  . Paresthesia    bilateral upper extremities, very weak ; poor grip  (must use adaptable silverware );   . Permanent atrial fibrillation    Long-standing (> 4 yrs). Initially  evaluated with Stress Test & Echo - Pt reports that these studies were "normal"    Patient Active Problem List   Diagnosis Date Noted  . Pressure injury of skin 11/05/2017  . Pneumonia 11/01/2017  . Chronic hypercapnic respiratory failure ([place]) 09/01/2017  . Aspiration pneumonia of both lungs (HCC)/ chronic/ recurrent 08/29/2017  . Acute respiratory failure with hypoxia ([place]) 08/15/2017  . Hyperlipidemia 08/15/2017  . COPD with chronic bronchitis and emphysema ([place]) 08/15/2017  . GERD (gastroesophageal reflux disease) 08/15/2017  . Hypothyroidism (acquired) 08/15/2017  . History of Cervical spinal cord compression (HCC) 08/15/2017  . Myasthenia gravis ([place]) 08/15/2017  . Acute on chronic systolic heart failure ([place]) 08/15/2017  . Acute hypokalemia 08/15/2017  . Decubitus ulcer of sacral region, stage 1 08/15/2017  . Pressure injury of sacral region, stage 2 ([place]) 02/06/2017  . Acute on chronic systolic (congestive) heart failure ([place]) 02/06/2017  . Community acquired pneumonia 02/04/2017  . A-fib ([place])   . Periprosthetic fracture around internal prosthetic right knee joint 12/15/2016  . Failed total knee arthroplasty ([place]) 12/15/2016  . Permanent atrial fibrillation 12/05/2016  . Pre-operative cardiovascular examination 12/05/2016  . Venous stasis of both lower extremities 12/05/2016  . Essential hypertension     Past Surgical History:  Procedure Laterality Date  . BACK SURGERY     x 6  . CARPAL TUNNEL RELEASE    . CHOLECYSTECTOMY    . ELBOW SURGERY  08/2015   Total of 3 surgeries  . REPLACEMENT TOTAL KNEE Bilateral    Right TKA 2003, left TKA 2010    . TOTAL KNEE REVISION Right 12/15/2016   Procedure: RIGHT TOTAL KNEE REVISION;  Surgeon: Gaynelle Arabian, MD;  Location: [facility];  Service: Orthopedics;  Laterality: Right;        Home Medications    Prior to Admission medications   Medication Sig Start Date End Date Taking? Authorizing Provider  acetaminophen (TYLENOL)  325 MG tablet Take 650 mg by mouth every 6 (six) hours as needed for moderate pain.    [provider]  apixaban (ELIQUIS) 5 MG TABS tablet Take 1 tablet (5 mg total) by mouth 2 (two) times daily. 08/25/17   Desiree Hane, MD  atorvastatin (LIPITOR) 10 MG tablet Take 10 mg by mouth every evening.  10/28/16   [provider]  azaTHIOprine (IMURAN) 50 MG tablet Take 50 mg by mouth 2 (two) times daily.    [provider]  benzonatate (TESSALON) 100 MG capsule Take 100 mg by mouth at bedtime.    [provider]  Brinzolamide-Brimonidine (SIMBRINZA) 1-0.2 % SUSP Place 1 drop into both eyes 2 (two) times daily.    [provider]  docusate sodium (COLACE) 100 MG capsule Take 100 mg by mouth 2 (two) times daily.    [provider]  DULERA 100-5 MCG/ACT AERO Take 2 puffs by mouth 2 (two) times daily.  10/10/16   [provider]  fluticasone (FLONASE) 50 MCG/ACT nasal spray Place 1 spray into both nostrils daily.    [provider]  furosemide (LASIX) 40 MG tablet Take 40 mg by mouth daily. 11/09/16   [provider]  guaiFENesin (MUCINEX) 600 MG 12 hr tablet Take 600 mg by mouth 2 (two) times daily.    [provider]  hydrOXYzine (ATARAX/VISTARIL) 10 MG tablet Take 1 tablet (10 mg total) by mouth 3 (three) times daily as needed for itching. 11/06/17   Doreatha Lew, MD  Ipratropium-Albuterol (COMBIVENT RESPIMAT) 20-100 MCG/ACT AERS respimat Inhale 2 puffs into the lungs 2 (two) times daily.     [provider]  levothyroxine (SYNTHROID, LEVOTHROID) 200 MCG tablet Take 200 mcg by mouth daily.  10/31/16   [provider]  lidocaine (LMX) 4 % cream Apply 1 application topically as needed. Apply to right side of face as needed for pain.    [provider]  losartan (COZAAR) 25 MG tablet Take 0.5 tablets (12.5 mg total) by mouth daily. 02/08/17   Katherine Roan, MD  magnesium hydroxide (MILK  OF MAGNESIA) 400 MG/5ML suspension Take 30 mLs by mouth See admin instructions. every 24 hours as needed for constipation.    [provider]  metoprolol succinate (TOPROL-XL) 50 MG 24 hr tablet Take 2 tablets (100 mg total) by mouth daily. 08/25/17   Desiree Hane, MD  mineral oil-hydrophilic petrolatum (AQUAPHOR) ointment Apply topically daily as needed for dry skin. 11/06/17   Doreatha Lew, MD  morphine (MS CONTIN) 15 MG 12 hr tablet Take 15 mg by mouth 2 (two) times daily. For pain 10/31/17   [provider]  pantoprazole (PROTONIX) 40 MG tablet Take 40 mg by mouth 2 (two) times daily.  10/28/16   [provider]  polycarbophil (FIBERCON) 625 MG tablet Take 1 tablet (625 mg total) by mouth at bedtime. 12/20/16   Perkins, Alexzandrew L, PA-C  predniSONE (DELTASONE) 10 MG tablet Take 1 tablet (  10 mg total) by mouth daily with breakfast. Take 40 mg for 3 more days, then 20 mg for 2 days, then 5 mg daily 11/06/17   Patrecia Pour, Christean Grief, MD  pregabalin (LYRICA) 25 MG capsule Take 25 mg by mouth daily. 10/25/17   [provider]  Psyllium (FIBER) 0.52 g CAPS Take 0.52 g by mouth at bedtime.    [provider]  senna (SENOKOT) 8.6 MG tablet Take 1 tablet by mouth every evening.    [provider]  sertraline (ZOLOFT) 100 MG tablet Take 150 mg by mouth daily.  10/28/16   [provider]  temazepam (RESTORIL) 30 MG capsule Take 30 mg by mouth at bedtime.  11/09/16   [provider]  TRAVATAN Z 0.004 % SOLN ophthalmic solution Place 1 drop into both eyes at bedtime.  11/09/16   [provider]    Family History Family History  Problem Relation Age of Onset  . Other Mother        Polio  . Heart failure Father   . Arthritis Sister   . Arthritis Brother     Social History Social History   Tobacco Use  . Smoking status: Never Smoker  . Smokeless tobacco: Never Used  Substance Use Topics  . Alcohol use: No  . Drug  use: No     Allergies   Patient has no known allergies.   Review of Systems Review of Systems  Constitutional: Negative for chills and fever.  Respiratory: Positive for cough and shortness of breath.   Cardiovascular: Negative for chest pain and leg swelling.  Gastrointestinal: Negative for abdominal pain, diarrhea, nausea and vomiting.  All other systems reviewed and are negative.   Physical Exam Updated Vital Signs BP (!) 115/100 (BP Location: Left Arm)   Pulse (!) 41   Temp 99.2 F (37.3 C) (Axillary)   Resp (!) 22   SpO2 99%   Physical Exam  Constitutional: He appears well-developed and well-nourished. No distress.  HENT:  Head: Normocephalic and atraumatic.  Eyes: Conjunctivae are normal.  Neck: Neck supple.  Cardiovascular: Normal heart sounds and intact distal pulses. An irregularly irregular rhythm present. Tachycardia present.  Pulmonary/Chest: Effort normal. Tachypnea noted. No respiratory distress. He has rhonchi.  Abdominal: Soft. There is no tenderness. There is no guarding.  Musculoskeletal: He exhibits no edema.  Lymphadenopathy:    He has no cervical adenopathy.  Neurological: He is alert.  Skin: Skin is warm and dry. He is not diaphoretic.  Psychiatric: He has a normal mood and affect. His behavior is normal.  Nursing note and vitals reviewed.    ED Treatments / Results  Labs (all labs ordered are listed, but only abnormal results are displayed) Labs Reviewed  COMPREHENSIVE METABOLIC PANEL - Abnormal; Notable for the following components:      Result Value   Potassium 2.2 (*)    Chloride 97 (*)    Glucose, Bld 125 (*)    Calcium 7.2 (*)    Total Protein 5.2 (*)    Albumin 2.3 (*)    Alkaline Phosphatase 138 (*)    All other components within normal limits  CBC WITH DIFFERENTIAL/PLATELET - Abnormal; Notable for the following components:   WBC 15.1 (*)    RBC 4.00 (*)    Hemoglobin 10.8 (*)    HCT 36.1 (*)    MCHC 29.9 (*)    RDW 19.3  (*)    Neutro Abs 14.2 (*)    Lymphs Abs  0.2 (*)    All other components within normal limits  I-STAT ARTERIAL BLOOD GAS, ED - Abnormal; Notable for the following components:   pO2, Arterial 62.0 (*)    Bicarbonate 31.6 (*)    TCO2 33 (*)    Acid-Base Excess 6.0 (*)    All other components within normal limits  I-STAT VENOUS BLOOD GAS, ED - Abnormal; Notable for the following components:   pO2, Ven 53.0 (*)    Bicarbonate 33.4 (*)    TCO2 35 (*)    Acid-Base Excess 6.0 (*)    All other components within normal limits  CULTURE, BLOOD (ROUTINE X 2)  CULTURE, BLOOD (ROUTINE X 2)  INFLUENZA PANEL BY PCR (TYPE A & B)  URINALYSIS, ROUTINE W REFLEX MICROSCOPIC  CBC  CREATININE, SERUM  I-STAT CG4 LACTIC ACID, ED  I-STAT CG4 LACTIC ACID, ED    EKG EKG Interpretation  Date/Time:  Wednesday January 24 2018 12:02:31 EST Ventricular Rate:  164 PR Interval:    QRS Duration: 106 QT Interval:  300 QTC Calculation: 520 R Axis:   -24 Text Interpretation:  Atrial fibrillation with rapid V-rate Borderline left axis deviation Repolarization abnormality, prob rate related faster rate than previously Confirmed by Merrily Pew [phone]) on 01/24/2018 3:28:02 PM   Radiology Dg Chest Portable 1 View  Result Date: 01/24/2018 CLINICAL DATA:  Cough and fever her EXAM: PORTABLE CHEST 1 VIEW COMPARISON:  January 16, 2018 FINDINGS: There is persistent airspace consolidation throughout the left lower lobe with small left pleural effusion. There is increase in patchy opacity in the right lower lobe, felt to represent progression of pneumonia in this area. There is a small right pleural effusion. There is cardiomegaly with pulmonary vascularity normal. No adenopathy. There is aortic atherosclerosis. No bone lesions. IMPRESSION: Bilateral lower lobe consolidation felt to represent pneumonia, more severe on the left than on the right. New versus progression of apparent pneumonia right lower lobe compared to  prior study. Stable cardiomegaly. No adenopathy evident. There is aortic atherosclerosis. Aortic Atherosclerosis (ICD10-I70.0). Electronically Signed   By: Lowella Grip III M.D.   On: 01/24/2018 12:38    Procedures Procedures (including critical care time)  Medications Ordered in ED Medications  potassium chloride 10 mEq in 100 mL IVPB (has no administration in time range)  acetaminophen (TYLENOL) tablet 650 mg (has no administration in time range)  morphine (MS CONTIN) 12 hr tablet 15 mg (has no administration in time range)  atorvastatin (LIPITOR) tablet 10 mg (has no administration in time range)  furosemide (LASIX) tablet 40 mg (has no administration in time range)  losartan (COZAAR) tablet 12.5 mg (has no administration in time range)  metoprolol succinate (TOPROL-XL) 24 hr tablet 100 mg (has no administration in time range)  hydrOXYzine (ATARAX/VISTARIL) tablet 10 mg (has no administration in time range)  sertraline (ZOLOFT) tablet 50 mg (has no administration in time range)  temazepam (RESTORIL) capsule 30 mg (has no administration in time range)  levothyroxine (SYNTHROID, LEVOTHROID) tablet 200 mcg (has no administration in time range)  docusate sodium (COLACE) capsule 100 mg (has no administration in time range)  magnesium hydroxide (MILK OF MAGNESIA) suspension 30 mL (has no administration in time range)  pantoprazole (PROTONIX) EC tablet 40 mg (has no administration in time range)  polycarbophil (FIBERCON) tablet 625 mg (has no administration in time range)  senna (SENOKOT) tablet 8.6 mg (has no administration in time range)  apixaban (ELIQUIS) tablet 5 mg (has no administration in time range)  azaTHIOprine (IMURAN) tablet 50 mg (has no administration in time range)  pregabalin (LYRICA) capsule 25 mg (has no administration in time range)  mometasone-formoterol (DULERA) 100-5 MCG/ACT inhaler 2 puff (has no administration in time range)  fluticasone (FLONASE) 50 MCG/ACT  nasal spray 1 spray (has no administration in time range)  guaiFENesin (MUCINEX) 12 hr tablet 600 mg (has no administration in time range)  latanoprost (XALATAN) 0.005 % ophthalmic solution 1 drop (has no administration in time range)  0.9 %  sodium chloride infusion (has no administration in time range)  ondansetron (ZOFRAN) tablet 4 mg (has no administration in time range)    Or  ondansetron (ZOFRAN) injection 4 mg (has no administration in time range)  ipratropium-albuterol (DUONEB) 0.5-2.5 (3) MG/3ML nebulizer solution 3 mL (has no administration in time range)  piperacillin-tazobactam (ZOSYN) IVPB 3.375 g (has no administration in time range)  sodium chloride 0.9 % bolus 250 mL (0 mLs Intravenous Stopped 01/24/18 1442)  acetaminophen (TYLENOL) tablet 650 mg (650 mg Oral Given 01/24/18 1259)  ceFEPIme (MAXIPIME) 2 g in sodium chloride 0.9 % 100 mL IVPB (0 g Intravenous Stopped 01/24/18 1351)  potassium chloride 10 mEq in 100 mL IVPB (0 mEq Intravenous Stopped 01/24/18 1514)  sodium chloride 0.9 % bolus 500 mL (0 mLs Intravenous Stopped 01/24/18 1548)     Initial Impression / Assessment and Plan / ED Course  I have reviewed the triage vital signs and the nursing notes.  Pertinent labs & imaging results that were available during my care of the patient were reviewed by me and considered in my medical decision making (see chart for details).  Clinical Course as of Jan 25 1651  Wed Jan 24, 2018  1435 Spoke with patient's daughters at the bedside.  They state patient has had difficulty with swallowing and aspiration for at least the past year.  He refuses to adhere to a modified diet to protect from aspiration.  Because of this, he has had recurrent pneumonia.  Was most recently diagnosed with pneumonia a week ago at the nursing facility and placed on Levaquin.   [SJ]  1518 Spoke with hospitalist. Agrees to admit the patient.  Request CT PE study for further information regarding the  patient's recurrent pneumonia and possible aspiration.   [SJ]    Clinical Course User Index [SJ] Joy, Shawn C, PA-C    Patient presents with cough and shortness of breath.  Evidence of bilateral lower lobe pneumonia on chest x-ray.  Hypoxic on room air.  Initially tachycardic to the 150s, improved to the 120s with IV fluids.  Admitted for IV antibiotics and further management.   Findings and plan of care discussed with Merrily Pew, MD. Dr. Dayna Barker personally evaluated and examined this patient.  Vitals:   01/24/18 1545 01/24/18 1600 01/24/18 1615 01/24/18 1630  BP: 119/61 114/62 (!) 101/55 126/60  Pulse: 67 91 (!) 118   Resp: 15 17 16 15   Temp:      TempSrc:      SpO2: 98% 98% 95% 97%     Final Clinical Impressions(s) / ED Diagnoses   Final diagnoses:  Community acquired pneumonia, unspecified laterality  Shortness of breath  Hypokalemia    ED Discharge Orders    None       Layla Maw 01/24/18 1652    Mesner, Corene Cornea, MD 01/25/18 [phone]

## 2018-01-24 NOTE — ED Notes (Signed)
Admitting MD at bedside.

## 2018-01-24 NOTE — ED Triage Notes (Signed)
Per [facility]: Patient to ED from [facility] for recent pneumonia and worsening symptoms today. According to patient, he's had a cough x 1 week. EMS reports that patient had decreased mentation this morning. Skin very warm to touch (EMS temporal temp upper 99.70F). Upon EMS arrival, SpO2 80s on RA - increased to 97% on 4L O2 nasal cannula. 18. PIV RAC. EMS VS: HR 100-150 A-Fib (hx of), 96/64, RR 20, CBG 125.

## 2018-01-25 DIAGNOSIS — E876 Hypokalemia: Secondary | ICD-10-CM

## 2018-01-25 DIAGNOSIS — Z7189 Other specified counseling: Secondary | ICD-10-CM

## 2018-01-25 DIAGNOSIS — J189 Pneumonia, unspecified organism: Secondary | ICD-10-CM

## 2018-01-25 DIAGNOSIS — R0602 Shortness of breath: Secondary | ICD-10-CM

## 2018-01-25 DIAGNOSIS — Z66 Do not resuscitate: Secondary | ICD-10-CM

## 2018-01-25 DIAGNOSIS — Z515 Encounter for palliative care: Secondary | ICD-10-CM

## 2018-01-25 LAB — URINALYSIS, ROUTINE W REFLEX MICROSCOPIC
Bacteria, UA: NONE SEEN
Glucose, UA: NEGATIVE mg/dL
Hgb urine dipstick: NEGATIVE
Ketones, ur: 5 mg/dL — AB
Nitrite: NEGATIVE
Protein, ur: 30 mg/dL — AB
SPECIFIC GRAVITY, URINE: 1.025 (ref 1.005–1.030)
pH: 5 (ref 5.0–8.0)

## 2018-01-25 LAB — BASIC METABOLIC PANEL
Anion gap: 10 (ref 5–15)
BUN: 13 mg/dL (ref 8–23)
CALCIUM: 6.8 mg/dL — AB (ref 8.9–10.3)
CO2: 29 mmol/L (ref 22–32)
CREATININE: 0.86 mg/dL (ref 0.61–1.24)
Chloride: 100 mmol/L (ref 98–111)
GFR calc Af Amer: >60 mL/min (ref >60)
GFR calc non Af Amer: >60 mL/min (ref >60)
Glucose, Bld: 106 mg/dL — ABNORMAL HIGH (ref 70–99)
Potassium: 2.3 mmol/L — CL (ref 3.5–5.1)
Sodium: 139 mmol/L (ref 135–145)

## 2018-01-25 MED ORDER — SODIUM CHLORIDE 0.9 % IV SOLN
INTRAVENOUS | Status: DC | PRN
  Administered 2018-01-25: 250 mL via INTRAVENOUS

## 2018-01-25 MED ORDER — POTASSIUM CHLORIDE CRYS ER 20 MEQ PO TBCR
40.0000 meq | EXTENDED_RELEASE_TABLET | Freq: Three times a day (TID) | ORAL | Status: DC
  Administered 2018-01-25: 40 meq via ORAL
  Filled 2018-01-25: qty 2

## 2018-01-25 MED ORDER — IPRATROPIUM-ALBUTEROL 0.5-2.5 (3) MG/3ML IN SOLN
3.0000 mL | Freq: Four times a day (QID) | RESPIRATORY_TRACT | Status: DC
  Administered 2018-01-25: 3 mL via RESPIRATORY_TRACT
  Filled 2018-01-25: qty 3

## 2018-01-25 MED ORDER — ALBUTEROL SULFATE (2.5 MG/3ML) 0.083% IN NEBU: 2.5000 mg | INHALATION_SOLUTION | RESPIRATORY_TRACT | Status: DC | PRN

## 2018-01-25 MED ORDER — IPRATROPIUM-ALBUTEROL 0.5-2.5 (3) MG/3ML IN SOLN
3.0000 mL | Freq: Three times a day (TID) | RESPIRATORY_TRACT | Status: DC
  Administered 2018-01-25 – 2018-01-27 (×5): 3 mL via RESPIRATORY_TRACT
  Filled 2018-01-25 (×5): qty 3

## 2018-01-25 MED ORDER — POTASSIUM CHLORIDE 10 MEQ/100ML IV SOLN
INTRAVENOUS | Status: AC
  Administered 2018-01-25: 10 meq
  Filled 2018-01-25: qty 100

## 2018-01-25 MED ORDER — MAGNESIUM SULFATE 2 GM/50ML IV SOLN
2.0000 g | Freq: Once | INTRAVENOUS | Status: AC
  Administered 2018-01-25: 2 g via INTRAVENOUS
  Filled 2018-01-25: qty 50

## 2018-01-25 MED ORDER — GUAIFENESIN ER 600 MG PO TB12
1200.0000 mg | ORAL_TABLET | Freq: Two times a day (BID) | ORAL | Status: DC
Start: 2018-01-25 — End: 2018-01-30
  Administered 2018-01-25 – 2018-01-29 (×7): 1200 mg via ORAL
  Filled 2018-01-25 (×9): qty 2

## 2018-01-25 MED ORDER — SODIUM CHLORIDE 3 % IN NEBU
4.0000 mL | INHALATION_SOLUTION | Freq: Three times a day (TID) | RESPIRATORY_TRACT | Status: DC
  Administered 2018-01-25 – 2018-01-27 (×6): 4 mL via RESPIRATORY_TRACT
  Filled 2018-01-25 (×7): qty 4

## 2018-01-25 MED ORDER — POTASSIUM CHLORIDE 10 MEQ/100ML IV SOLN
10.0000 meq | INTRAVENOUS | Status: AC
  Administered 2018-01-25 (×5): 10 meq via INTRAVENOUS
  Filled 2018-01-25 (×5): qty 100

## 2018-01-25 NOTE — Progress Notes (Addendum)
PROGRESS NOTE  Travis Ross XTK:[id number] DOB: Jul 17, 1926 DOA: 01/24/2018 PCP: Virgel Bouquet, MD  HPI/Recap of past 24 hours: Travis Ross is a 84 y.o. male with h/o COPD, emphysema, hypertension, GERD, hyperlipidemia, hypothyroidism, chronic atrial fibrillation on anticoagulation, hearing loss and myasthenia gravis with chronic dysphagia who resides at skilled nursing facility and has had recurrent admissions for aspiration pneumonia (in December 2018, June 2019 and September 2019) brought in due to shortness of breath, hypoxia, persistent wet cough, and failure of outpatient treatment with Levaquin for pneumonia.  Patient is wheelchair-bound at baseline due to bilateral knee arthritis/old right femur fracture and chronic muscle weakness.  He however is mentally sound and able to communicate according to family members although currently appears somnolent and tired. Admitted for sepsis secondary to aspiration pneumonia.  01/25/2018: Patient examined at his bedside.  He has persistent wet productive cough.  Added pulmonary toilet with Mucinex 1200 mg twice daily, hypersaline nebs 3 times daily, and chest PT twice daily.  Denies chest pain.  Assessment/Plan: Active Problems:   Pneumonia   Sepsis secondary to recurrent aspiration pneumonia Presented with leukocytosis with WBC 15,000, A. fib RVR with rate of 125 Obtain sputum culture Treat empirically with IV Zofran Start pulmonary toilet with Mucinex 1200 mg twice daily, hypersaline nebs 3 times daily, and chest PT twice daily Continue duo nebs every 6 hours Swallow evaluation by speech therapy Start dysphagia diet and closely follow  Acute hypoxic respiratory failure Requiring oxygen at this time Maintain O2 saturation greater than 92% Breathing treatment as stated above  Severe hypokalemia Potassium 2.3 Repleted with oral potassium 40 mEq and 60 mEq IV Repeat BMP in the morning  Chronic A. fib with resolved RVR Suspect RVR  was secondary to sepsis physiology Rate controlled Resume anticoagulation Eliquis Continue metoprolol  Myasthenia gravis Resume home medications  Hypothyroidism Resume levothyroxine  Hyperlipidemia Resume Lipitor  Hypertension Blood pressure is well controlled Resume home antihypertensive medications  Ambulatory dysfunction PT to assess Fall precautions   DVT prophylaxis: Eliquis  Code Status: DNR as confirmed with family  Family Communication: none at bedside Consults called: Palliative care, Speech therapy  Disposition: Possible home in 1-2 days   Objective: Vitals:   01/25/18 0436 01/25/18 0854 01/25/18 0941 01/25/18 1023  BP: 105/65  122/81 105/72  Pulse: 98  70 98  Resp: 16   18  Temp: 97.9 F (36.6 C)  98 F (36.7 C) 97.8 F (36.6 C)  TempSrc: Oral  Oral Oral  SpO2: 95% 95% 92% 90%  Weight:        Intake/Output Summary (Last 24 hours) at 01/25/2018 1435 Last data filed at 01/25/2018 9924 Gross per 24 hour  Intake 2139.78 ml  Output 1 ml  Net 2138.78 ml   Filed Weights   01/24/18 2055  Weight: 89.8 kg    Exam:  . General: 84 y.o. year-old male well developed well nourished in no acute distress.  Alert and oriented x3. . Cardiovascular: Irregular rate and rhythm with no rubs or gallops.  No thyromegaly or JVD noted.   Marland Kitchen Respiratory: Audible diffused rales. Good inspiratory effort. . Abdomen: Soft nontender nondistended with normal bowel sounds x4 quadrants. . Musculoskeletal:Trace lower extremity edema. 2/4 pulses in all 4 extremities. Marland Kitchen Psychiatry: Mood is appropriate for condition and setting   Data Reviewed: CBC: Recent Labs  Lab 01/24/18 1210  WBC 15.1*  NEUTROABS 14.2*  HGB 10.8*  HCT 36.1*  MCV 90.3  PLT 268   Basic Metabolic Panel:  Recent Labs  Lab 01/24/18 1210 01/25/18 0757  NA 140 139  K 2.2* 2.3*  CL 97* 100  CO2 31 29  GLUCOSE 125* 106*  BUN 9 13  CREATININE 0.96 0.86  CALCIUM 7.2* 6.8*   GFR: Estimated  Creatinine Clearance: 67.7 mL/min (by C-G formula based on SCr of 0.86 mg/dL). Liver Function Tests: Recent Labs  Lab 01/24/18 1210  AST 27  ALT 13  ALKPHOS 138*  BILITOT 1.2  PROT 5.2*  ALBUMIN 2.3*   No results for input(s): LIPASE, AMYLASE in the last 168 hours. No results for input(s): AMMONIA in the last 168 hours. Coagulation Profile: No results for input(s): INR, PROTIME in the last 168 hours. Cardiac Enzymes: No results for input(s): CKTOTAL, CKMB, CKMBINDEX, TROPONINI in the last 168 hours. BNP (last 3 results) No results for input(s): PROBNP in the last 8760 hours. HbA1C: No results for input(s): HGBA1C in the last 72 hours. CBG: No results for input(s): GLUCAP in the last 168 hours. Lipid Profile: No results for input(s): CHOL, HDL, LDLCALC, TRIG, CHOLHDL, LDLDIRECT in the last 72 hours. Thyroid Function Tests: No results for input(s): TSH, T4TOTAL, FREET4, T3FREE, THYROIDAB in the last 72 hours. Anemia Panel: No results for input(s): VITAMINB12, FOLATE, FERRITIN, TIBC, IRON, RETICCTPCT in the last 72 hours. Urine analysis:    Component Value Date/Time   COLORURINE AMBER (A) 01/25/2018 0140   APPEARANCEUR CLEAR 01/25/2018 0140   LABSPEC 1.025 01/25/2018 0140   PHURINE 5.0 01/25/2018 0140   GLUCOSEU NEGATIVE 01/25/2018 0140   HGBUR NEGATIVE 01/25/2018 0140   BILIRUBINUR SMALL (A) 01/25/2018 0140   KETONESUR 5 (A) 01/25/2018 0140   PROTEINUR 30 (A) 01/25/2018 0140   NITRITE NEGATIVE 01/25/2018 0140   LEUKOCYTESUR TRACE (A) 01/25/2018 0140   Sepsis Labs: @LABRCNTIP (procalcitonin:4,lacticidven:4)  ) Recent Results (from the past 240 hour(s))  Blood Culture (routine x 2)     Status: None (Preliminary result)   Collection Time: 01/24/18 12:10 PM  Result Value Ref Range Status   Specimen Description BLOOD BLOOD RIGHT FOREARM  Final   Special Requests   Final    BOTTLES DRAWN AEROBIC AND ANAEROBIC Blood Culture results may not be optimal due to an excessive  volume of blood received in culture bottles   Culture   Final    NO GROWTH < 24 HOURS Performed at [facility] [address] [place], [place] [address]    Report Status PENDING  Incomplete  Blood Culture (routine x 2)     Status: None (Preliminary result)   Collection Time: 01/24/18 12:15 PM  Result Value Ref Range Status   Specimen Description BLOOD RIGHT ANTECUBITAL  Final   Special Requests   Final    BOTTLES DRAWN AEROBIC AND ANAEROBIC Blood Culture adequate volume   Culture   Final    NO GROWTH < 24 HOURS Performed at [facility], [place] [address], [place], [place] [address]    Report Status PENDING  Incomplete  MRSA PCR Screening     Status: None   Collection Time: 01/24/18  6:01 PM  Result Value Ref Range Status   MRSA by PCR NEGATIVE NEGATIVE Final    Comment:        The GeneXpert MRSA Assay (FDA approved for NASAL specimens only), is one component of a comprehensive MRSA colonization surveillance program. It is not intended to diagnose MRSA infection nor to guide or monitor treatment for MRSA infections. Performed at [facility], [place]  [address], [place], [place] [address]       Studies: No results found.  Scheduled Meds: . apixaban  5 mg Oral BID  . atorvastatin  10 mg Oral QPM  . azaTHIOprine  50 mg Oral BID  . brimonidine  1 drop Both Eyes BID  . brinzolamide  1 drop Both Eyes BID  . docusate sodium  100 mg Oral BID  . feeding supplement (ENSURE ENLIVE)  237 mL Oral BID BM  . fluticasone  1 spray Each Nare Daily  . guaiFENesin  1,200 mg Oral BID  . ipratropium-albuterol  3 mL Nebulization Q6H  . latanoprost  1 drop Both Eyes QHS  . levothyroxine  200 mcg Oral QAC breakfast  . losartan  12.5 mg Oral Daily  . mouth rinse  15 mL Mouth Rinse BID  . metoprolol tartrate  25 mg Oral BID  . mometasone-formoterol  2 puff Inhalation BID  . morphine  15 mg Oral BID  . pantoprazole  40 mg Oral BID  . polycarbophil  625 mg Oral QHS  .  sertraline  50 mg Oral Daily  . sodium chloride HYPERTONIC  4 mL Nebulization TID    Continuous Infusions: . sodium chloride Stopped (01/24/18 1807)  . sodium chloride 250 mL (01/25/18 0325)  . piperacillin-tazobactam (ZOSYN)  IV 3.375 g (01/25/18 1310)  . potassium chloride 10 mEq (01/25/18 1255)     LOS: 1 day     Kayleen Memos, MD [facility] Pager [phone]  If 7PM-7AM, please contact night-coverage [email or web address] Password [facility] 01/25/2018, 2:35 PM

## 2018-01-25 NOTE — Evaluation (Signed)
Clinical/Bedside Swallow Evaluation Patient Details  Name: Travis Ross MRN: [id number] Date of Birth: 07/01/1926  Today's Date: 01/25/2018 Time: SLP Start Time (ACUTE ONLY): 9798 SLP Stop Time (ACUTE ONLY): 0940 SLP Time Calculation (min) (ACUTE ONLY): 35 min  Past Medical History:  Past Medical History:  Diagnosis Date  . CAP (community acquired pneumonia) 02/04/2017; 01/24/2018  . COPD with chronic bronchitis and emphysema ([place])   . Essential hypertension   . Femur fracture, right ([place])    Related to a fall. Periprosthetic distal femur fracture (close to the right knee prosthesis).  > Initial plans were to treat with brace, however, has now progressed and may require surgery  . GERD (gastroesophageal reflux disease)   . Glaucoma   . History of Cervical spinal cord compression w/ residual hand parestheisa   . Hyperlipidemia   . Hypothyroidism (acquired)    On Levothyroxine  . Myasthenia gravis ([place])    Now (prior to recent femur fracture) was relegated to maneuvering himself around on a Rolling Walker (Rollator) using his feet & sitting on the walker.  Does not have arm strength to mover a wheelchair or transfer.;   . Osteoarthritis    Global osteoarthritis involving back, hips and knees as well as elbows.  . Paresthesia    bilateral upper extremities, very weak ; poor grip  (must use adaptable silverware );   . Permanent atrial fibrillation    Long-standing (> 4 yrs). Initially evaluated with Stress Test & Echo - Pt reports that these studies were "normal"   Past Surgical History:  Past Surgical History:  Procedure Laterality Date  . BACK SURGERY     x 6  . CARPAL TUNNEL RELEASE    . CHOLECYSTECTOMY    . ELBOW SURGERY  08/2015   Total of 3 surgeries  . REPLACEMENT TOTAL KNEE Bilateral    Right TKA 2003, left TKA 2010    . TOTAL KNEE REVISION Right 12/15/2016   Procedure: RIGHT TOTAL KNEE REVISION;  Surgeon: Gaynelle Arabian, MD;  Location: [facility];  Service: Orthopedics;   Laterality: Right;   HPI:  84 yo male adm to [facility] with respiratory difficulties.  Found to have pna concerning for recurrent aspiration.  Pt has declined diet modification at SNF and documentation/letter in chart indicating such on January 24, 2018.  Pt has PMH + for Myasthenia Gravis, recurrent hospitalizations for pnas, spinal cord compresssion at C3 per pt diagnosed November 2018 and progressing.  Pt states currently he is essentially a quad. Chin tuck posture advised on prior MBS but SLP has concerns for contraindication present with pt's cord compression.     Assessment / Plan / Recommendation Clinical Impression  Patient continues to present with signs of dysphagia - resulting in coughing post=swallow.  His voice is hoarse and marginally weak but cough is strong.  Assisted pt to consume honey thick liquids, pureed meats/eggs *required mastication, and thin.  Pt with delayed cough with thicker consistencies and immediate cough post-swallow of thin.  However on prior MBS, pt had more pharyngeal residuals with thicker consistencies resulting in worsening asp pna with aspiration, thus recommend thin liquids.     Chin tuck posture not consistently conducted and SlP is concerned this posture may be contraindicated due to pt's c3 cervical cord compression.  Requested pt have daughter call neurosurgeon for guidance on this maneuver as this maneuver was recommended on prior MBS.   Established prior strategies recommended on MBS 07/2017 to mitigate aspiration.  In this SlPs opinion, pt  will aspirate regardless of dietary consistency.    Advised pt to get dentition removed when able as he reported losing several teeth recently and advised to increased pulmonary risk with decayed dentition.  Also recommend pt strengthen "hock" to facilitate clearance- pharyngeal and drink water with his meals.    Pt willing to accept modified diet currently. Pt was verbose during session and benefited from frequent  redirection to task at hand.  SLP Visit Diagnosis: Dysphagia, oropharyngeal phase (R13.12)    Aspiration Risk  Severe aspiration risk    Diet Recommendation Dysphagia 2 (Fine chop);Thin liquid   Liquid Administration via: Cup;Straw Medication Administration: Whole meds with puree Supervision: Staff to assist with self feeding;Full supervision/cueing for compensatory strategies Compensations: Small sips/bites;Slow rate;Multiple dry swallows after each bite/sip(intermittent cough/throat clear) Postural Changes: Seated upright at 90 degrees;Remain upright for at least 30 minutes after po intake    Other  Recommendations Oral Care Recommendations: Oral care BID   Follow up Recommendations     SNF briefly for mitigation strategies   Frequency and Duration min 1 x/week  1 week       Prognosis Prognosis for Safe Diet Advancement: Fair Barriers to Reach Goals: Time post onset;Severity of deficits      Swallow Study   General Date of Onset: 01/25/18 HPI: 84 yo male adm to [facility] with respiratory difficulties.  Found to have pna concerning for recurrent aspiration.  Pt has declined diet modification at SNF and documentation/letter in chart indicating such on January 24, 2018.  Pt has PMH + for Myasthenia Gravis, recurrent hospitalizations for pnas, spinal cord compresssion at C3 per pt diagnosed November 2018 and progressing.  Pt states currently he is essentially a quad. Chin tuck posture advised on prior MBS but SLP has concerns for contraindication present with pt's cord compression.   Type of Study: Bedside Swallow Evaluation Diet Prior to this Study: Dysphagia 1 (puree);Honey-thick liquids Temperature Spikes Noted: No Respiratory Status: Nasal cannula History of Recent Intubation: No Behavior/Cognition: Alert;Cooperative;Pleasant mood Oral Cavity Assessment: Dried secretions;Excessive secretions(white secretions adhered to pharyngeal wall) Oral Care Completed by SLP: No(pt  eating) Oral Cavity - Dentition: Poor condition(2 lower teeth, upper present anterior) Vision: Functional for self-feeding Self-Feeding Abilities: Total assist(secondary to limb involvement from cervical spine issue) Patient Positioning: Upright in bed Baseline Vocal Quality: Hoarse(pt reports voice "comes and goes") Volitional Cough: Strong Volitional Swallow: Unable to elicit    Oral/Motor/Sensory Function Overall Oral Motor/Sensory Function: Within functional limits   Ice Chips Ice chips: Not tested   Thin Liquid Thin Liquid: Impaired Presentation: Self Fed;Straw(with assist) Pharyngeal  Phase Impairments: Cough - Immediate    Nectar Thick Nectar Thick Liquid: Not tested   Honey Thick Honey Thick Liquid: Impaired Presentation: Straw;Self fed(with assist) Pharyngeal Phase Impairments: Cough - Delayed   Puree Puree: Not tested Other Comments: pt declined to consume further intake of applesauce   Solid     Solid: Impaired Presentation: Spoon Pharyngeal Phase Impairments: Cough - Delayed Other Comments: puree was cold and required mastication which pt was able to conduct adequately      Macario Golds 01/25/2018,10:01 AM   Luanna Salk, [place] Pager [phone] Office [phone]

## 2018-01-25 NOTE — Consult Note (Signed)
Consultation Note Date: 01/25/2018   Patient Name: Travis Ross  DOB: [birth year]  MRN: [id number]  Age / Sex: 84 y.o., male  PCP: Virgel Bouquet, MD Referring Physician: Kayleen Memos, DO  Reason for Consultation: Establishing goals of care  HPI/Patient Profile: 84 y.o. male admitted on 01/24/2018 from [facility] with complaints of cough and shortness of breath.  He has a past medical history significant for COPD, emphysema, hypertension, hyperlipidemia, atrial fibrillation (Eliquis), myasthenia gravis, dysphagia, hard of hearing, recurrent pneumonia, right femur fracture, cervical cord compression with residual hand paraesthesia, and osteoarthritis. Patient has recurrent admission for aspiration pneumonia (4 times since December 2018). Patient presented to ED from facility with shortness of breath, hypoxia , cough, and failure of outpatient treatment with Levaquin for pneumonia. Patient was seen during his previous admission and advised to of a mechanical soft diet with thicken liquids. Daughters reported patient has not complied with recommendations and they are aware of risk of recurrent aspiration. Daughters report patient is wheel-chair bound. He is alert and oriented at baseline. During his ED course oxygen saturation 85% on room air. Patient initiated on nasal cannula for support. WBC 15.1, K 2.2, Albumin 2.3, chest x-ray showed bilateral lower lobe pneumonia, more severe on the left, new versus progression of pneumonia right lower lobe compared to previous. Since admission he continues with shortness of breath and congestive cough. Requiring supplemental oxygen via nasal cannula and nebulizer. He has been evaluated by SLP with recommendations of dysphagia 2 diet. He continues on home medications. Palliative Medicine team consulted for goals of care discussion.   Clinical Assessment and Goals of Care: I have  reviewed medical records including lab results, imaging, Epic notes, and MAR, received report from the bedside RN, and assessed the patient. I then met at the bedside with patient, and his two daughters [name] and Plaza) to discuss diagnosis prognosis, GOC, EOL wishes, disposition and options. Patient is A&O x3. He has some shortness of breath with conversation. He denies pain. Patient is able to engage in goals of care discussion with his daughters and I.   I re-introduced Palliative Medicine as specialized medical care for people living with serious illness. It focuses on providing relief from the symptoms and stress of a serious illness. The goal is to improve quality of life for both the patient and the family.  Patient is familiar with Palliative team as we have been involved in his care during his previous admissions. Daughters and patient recalls previous meetings. Family denies outpatient support from hospice or palliative despite previous recommendations.   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 He has 6 children however only his 2 daughters and one son is involved in his care. He states his children live all over from [place], [place], [place], and [place]. He enjoys spending time with his family and friends.   As far as functional and nutritional status patient and his daughters report patient has experienced a continuous decline over the past 9  months or so. Patient recently moved here from up [place] back in the fall of 2018 and his daughter's purchased a new home for him. Unfortunately he was only able to enjoy his time in the home for no more than 2 weeks until he had a fall and required surgical interventions and also a decline in his health. He was a resident at [facility] and after several hospitalizations required an increase level of care which sent him to [facility]. Since placement daughters state he has continued to show signs of  decline. He is wheelchair bound and requires assistance with all ADLs. Daughter expresses patient requires assistance with meals due to hand paresthesia. He continues to have recurrent pneumonia with a slower bounce back each time.   We discussed his current illness and what it means in the larger context of his on-going co-morbidities.  Natural disease trajectory and expectations at EOL were discussed. I discussed specifically his rehospitalizations, re-ocurring pneumonia, and overall functional and nutritional status. Patient verbalized understanding.   Patient expressed his wishes to continue to treat his pneumonia while hospitalized without aggressive interventions. He verbalizes his wishes to return to [facility] once he is stable for discharge. Mr. Isadore discussed his feelings regarding his health over the past several months. He stated "I am getting closer to the end of my life. I am blessed to have lived this long. I have outlived both of my parents who died at 20 and 7 years old. " Patient states he knows he will be 89 next year and would be overjoyed to see that age but he is not naive that with his continuous decline he would most likely not see it.   Patient expresses "I am ready!" I offered patient to elaborate on his statement of being ready. Patient looks at his daughters tearfully and states "I think everyone knows what I mean!" His daughters are tearful and state they are not aware of his wishes as before he wanted to keep fighting. Patient expresses he is tired of coming back and forth to the hospital. He is tired and knows that his body has come to the point where it is tired of fighting and "just doesn't have much fight left". He expresses to his daughter that he hopes they have peace with his wishes and he wants them to go on with their life and not have to continue worrying about him being at the nursing home or if he is going to have to come back to the hospital. He expresses that  "I have lived my life and death is inevitable. I am not giving up I am just saying that this fight is over and I want to be at peace and comfort." Daughters are tearful and states they support his wishes and are at peace if he is at peace. Patient states he is at peace and once he is discharged after this admission his wishes are not to return again. He expressed if he develops pneumonia again or have another sickness he wants to be treated at the home and kept comfortable.   The difference between aggressive medical intervention and comfort care was considered in light of the patient's goals of care. Patient and daughters verbalized wishes to continue to treat the treatable while hospitalized with the goal of returning to [facility] without any further hospitalizations.   Patient has a filed advanced directive which identifies his daughters as his POA. He and his family confirms DNR/DNI. I educated patient and  family on MOST form. Patient has a current MOST however with his new expressed wishes they agreed to complete an updated form. MOST completed with wishes of DNR, Comfort care measures with no return to the hospital, seek assistance from hospice for comfort care, oral antibiotics if indicated, no IVF, no feeding tube. Both daughters signed document at patient's request.   Hospice and Palliative Care services outpatient were explained and offered. At this time family is requesting outpatient hospice services at [facility] upon his return. We also discussed residential hospice at request of daughters. We discussed in the event patient declines and requires aggressive symptom management and support patient's outpatient hospice team may discuss transferring to the facility. Daughters and patient expressed their willingness if he returns to [place] and continues to decline.    Questions and concerns were addressed.  Hard Choices booklet left for review. The family was encouraged to call with questions or concerns.   PMT will continue to support holistically.   Primary Decision Maker HCPOA: LYNN DODGE AND BETH [place]    SUMMARY OF RECOMMENDATIONS    DNR/DNI-as confirmed by patient/family  MOST form completed: DNR, Comfort Measures, Oral antibiotics (only) if indicated, no IVF or PEG.   Continue to treat the treatable while hospitalized without escalation of care.   Patient/family expressed wishes to return to [facility] with outpatient hospice.  CSW referral for outpatient hospice  Code Status/Advance Care Planning:  DNR/DNI  Symptom Management:   MS Contin chronic pain  Zofran for nausea   Nebulizer for cough/wheezing  Palliative Prophylaxis:   Aspiration, Bowel Regimen, Delirium Protocol, Frequent Pain Assessment and Palliative Wound Care  Additional Recommendations (Limitations, Scope, Preferences):  Avoid Hospitalization, Full Scope Treatment and No Artificial Feeding  Psycho-social/Spiritual:   Desire for further Chaplaincy support:NO  Prognosis:   < 6 months in the setting of bilateral pneumonia, chair/bed bound, decreased po intake, shortness of breath, COPD, emphysema, hypertension, hyperlipidemia, atrial fibrillation (Eliquis), myasthenia gravis, dysphagia, hard of hearing, recurrent pneumonia, right femur fracture, cervical cord compression with residual hand paraesthesia, and osteoarthritis  Discharge Planning: Family/Patient request return to [facility] with outpatient hospice      Primary Diagnoses: Present on Admission: . Pneumonia   I have reviewed the medical record, interviewed the patient and family, and examined the patient. The following aspects are pertinent.  Past Medical History:  Diagnosis Date  . CAP (community acquired pneumonia) 02/04/2017; 01/24/2018  . COPD with chronic bronchitis and emphysema ([place])   . Essential hypertension   . Femur fracture, right ([place])    Related to a fall. Periprosthetic distal femur fracture (close to the  right knee prosthesis).  > Initial plans were to treat with brace, however, has now progressed and may require surgery  . GERD (gastroesophageal reflux disease)   . Glaucoma   . History of Cervical spinal cord compression w/ residual hand parestheisa   . Hyperlipidemia   . Hypothyroidism (acquired)    On Levothyroxine  . Myasthenia gravis ([place])    Now (prior to recent femur fracture) was relegated to maneuvering himself around on a Rolling Walker (Rollator) using his feet & sitting on the walker.  Does not have arm strength to mover a wheelchair or transfer.;   . Osteoarthritis    Global osteoarthritis involving back, hips and knees as well as elbows.  . Paresthesia    bilateral upper extremities, very weak ; poor grip  (must use adaptable silverware );   . Permanent atrial fibrillation  Long-standing (> 4 yrs). Initially evaluated with Stress Test & Echo - Pt reports that these studies were "normal"   Social History   Socioeconomic History  . Marital status: Widowed    Spouse name: Not on file  . Number of children: 6  . Years of education: 27  . Highest education level: Not on file  Occupational History  . 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 Financial resource strain: Not on file  . Food insecurity:    Worry: Not on file    Inability: Not on file  . Transportation needs:    Medical: Not on file    Non-medical: Not on file  Tobacco Use  . Smoking status: Never Smoker  . Smokeless tobacco: Never Used  Substance and Sexual Activity  . Alcohol use: No  . Drug use: No  . Sexual activity: Never  Lifestyle  . Physical activity:    Days per week: Not on file    Minutes per session: Not on file  . Stress: Not on file  Relationships  . Social connections:    Talks on phone: Not on file    Gets together: Not on file    Attends religious service: Not on file    Active member of club or organization: Not on file    Attends  meetings of clubs or organizations: Not on file    Relationship status: Not on file  Other Topics Concern  . Not on file  Social History Narrative   He is a widower, who is recently moved down to [place] to live in assisted living facility to his daughter.   He has 6 total children, 7 grandchildren and 2 great-grandchildren.    He currently lives in assisted living facility, and is essentially immobilized.    Prior to his fall and leg fracture, he was able to maneuver himself around using a rolling walker, however since the fall and fracture, he is now essentially immobile requiring assistance for maneuvering him on a wheelchair. He is right leg is in an immobilizer brace.       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   Family History  Problem Relation Age of Onset  . Other Mother        Polio  . Heart failure Father   . Arthritis Sister   . Arthritis Brother    Scheduled Meds: . apixaban  5 mg Oral BID  . atorvastatin  10 mg Oral QPM  . azaTHIOprine  50 mg Oral BID  . brimonidine  1 drop Both Eyes BID  . brinzolamide  1 drop Both Eyes BID  . docusate sodium  100 mg Oral BID  . feeding supplement (ENSURE ENLIVE)  237 mL Oral BID BM  . fluticasone  1 spray Each Nare Daily  . guaiFENesin  1,200 mg Oral BID  . ipratropium-albuterol  3 mL Nebulization Q6H  . latanoprost  1 drop Both Eyes QHS  . levothyroxine  200 mcg Oral QAC breakfast  . losartan  12.5 mg Oral Daily  . mouth rinse  15 mL Mouth Rinse BID  . metoprolol tartrate  25 mg Oral BID  . mometasone-formoterol  2 puff Inhalation BID  . morphine  15 mg Oral BID  . pantoprazole  40 mg Oral BID  . polycarbophil  625 mg Oral QHS  . sertraline  50 mg Oral Daily  . sodium chloride HYPERTONIC  4 mL Nebulization TID   Continuous Infusions: . sodium chloride Stopped (01/24/18 1807)  . sodium chloride 250 mL (01/25/18 0325)  . magnesium sulfate 1 - 4 g bolus IVPB 2 g (01/25/18 1150)  .  piperacillin-tazobactam (ZOSYN)  IV 3.375 g (01/25/18 0327)  . potassium chloride     PRN Meds:.sodium chloride, acetaminophen, food thickener, hydrOXYzine, magnesium hydroxide, ondansetron **OR** ondansetron (ZOFRAN) IV, senna, temazepam Medications Prior to Admission:  Prior to Admission medications   Medication Sig Start Date End Date Taking? Authorizing Provider  Amino Acids-Protein Hydrolys (FEEDING SUPPLEMENT, PRO-STAT SUGAR FREE 64,) LIQD Take 30 mLs by mouth 3 (three) times daily with meals.   Yes [provider]  apixaban (ELIQUIS) 5 MG TABS tablet Take 1 tablet (5 mg total) by mouth 2 (two) times daily. 08/25/17  Yes Oretha Milch D, MD  atorvastatin (LIPITOR) 10 MG tablet Take 10 mg by mouth every evening.  10/28/16  Yes [provider]  azaTHIOprine (IMURAN) 50 MG tablet Take 50 mg by mouth 2 (two) times daily.   Yes [provider]  benzonatate (TESSALON) 100 MG capsule Take 100 mg by mouth at bedtime.   Yes [provider]  Brinzolamide-Brimonidine (SIMBRINZA) 1-0.2 % SUSP Place 1 drop into both eyes 2 (two) times daily.   Yes [provider]  chlorhexidine (PERIDEX) 0.12 % solution Use as directed 15 mLs in the mouth or throat 2 (two) times daily. Swish and spit   Yes [provider]  DULERA 100-5 MCG/ACT AERO Take 2 puffs by mouth 2 (two) times daily.  10/10/16  Yes [provider]  fluticasone (FLONASE) 50 MCG/ACT nasal spray Place 1 spray into both nostrils daily.   Yes [provider]  furosemide (LASIX) 40 MG tablet Take 40 mg by mouth daily. 11/09/16  Yes [provider]  GUAIFENESIN ER PO Take 400 mg by mouth 2 (two) times daily.    Yes [provider]  Ipratropium-Albuterol (COMBIVENT RESPIMAT) 20-100 MCG/ACT AERS respimat Inhale 2 puffs into the lungs 2 (two) times daily.    Yes [provider]  ipratropium-albuterol (DUONEB) 0.5-2.5 (3) MG/3ML SOLN Take 3 mLs by nebulization  every 6 (six) hours as needed (Wheezing).   Yes [provider]  levothyroxine (SYNTHROID, LEVOTHROID) 200 MCG tablet Take 200 mcg by mouth daily.  10/31/16  Yes [provider]  losartan (COZAAR) 25 MG tablet Take 0.5 tablets (12.5 mg total) by mouth daily. Patient taking differently: Take 12.5 mg by mouth daily. Hold for systolic blood pressure lower that 433 and dystolic lower than 60 29/51/88  Yes Winfrey, Jenne Pane, MD  magnesium hydroxide (MILK OF MAGNESIA) 400 MG/5ML suspension Take 30 mLs by mouth See admin instructions. every 24 hours as needed for constipation.   Yes [provider]  metoprolol tartrate (LOPRESSOR) 25 MG tablet Take 25 mg by mouth 2 (two) times daily. Hold if CZY<606 or diastolic BP> 60   Yes [provider]  morphine (MS CONTIN) 15 MG 12 hr tablet Take 15 mg by mouth 2 (two) times daily. For pain 10/31/17  Yes [provider]  ondansetron (ZOFRAN) 4 MG tablet Take 4 mg by mouth every 4 (four) hours as needed for nausea.  01/11/18  Yes [provider]  oxyCODONE (OXY IR/ROXICODONE) 5 MG immediate release tablet Take 5 mg by mouth 3 (three) times daily. Hold if sleepy or confused 01/15/18  Yes [provider]  pantoprazole (PROTONIX) 40 MG tablet Take 40 mg by  mouth 2 (two) times daily.  10/28/16  Yes [provider]  polycarbophil (FIBERCON) 625 MG tablet Take 1 tablet (625 mg total) by mouth at bedtime. 12/20/16  Yes Perkins, Alexzandrew L, PA-C  pregabalin (LYRICA) 25 MG capsule Take 25 mg by mouth daily. 10/25/17  Yes [provider]  SANTYL ointment Apply 1 application topically daily. Apply ointment nickel Thickness amount to right great distal toe ulcer daily 12/17/17  Yes [provider]  senna (SENOKOT) 8.6 MG tablet Take 1 tablet by mouth every evening.   Yes [provider]  sertraline (ZOLOFT) 50 MG tablet Take 50 mg by mouth daily.  10/28/16  Yes [provider]    temazepam (RESTORIL) 30 MG capsule Take 30 mg by mouth at bedtime.  11/09/16  Yes [provider]  TRAVATAN Z 0.004 % SOLN ophthalmic solution Place 1 drop into both eyes at bedtime.  11/09/16  Yes [provider]  amoxicillin-clavulanate (AUGMENTIN) 875-125 MG tablet Take 1 tablet by mouth 2 (two) times daily.    [provider]   No Known Allergies Review of Systems  Constitutional: Positive for appetite change and fatigue.  Respiratory: Positive for cough, shortness of breath and wheezing.   Neurological: Positive for weakness.  All other systems reviewed and are negative.   Physical Exam  Constitutional: He is oriented to person, place, and time. Vital signs are normal. He is cooperative. He has a sickly appearance.  Frail, chronically ill appearing   Cardiovascular: Normal rate, regular rhythm, normal heart sounds and normal pulses.  Pulmonary/Chest: Accessory muscle usage present. He has decreased breath sounds. He has wheezes. He has rhonchi.  Shortness of breath, 3L/[place]   Abdominal: Soft. Normal appearance and bowel sounds are normal.  Musculoskeletal:  Weakness, chairbound   Neurological: He is alert and oriented to person, place, and time. He displays atrophy.  Skin: Skin is warm and dry. Bruising noted.  Psychiatric: He has a normal mood and affect. His speech is normal and behavior is normal. Judgment and thought content normal. Cognition and memory are normal.  Nursing note and vitals reviewed.   Vital Signs: BP 105/72 (BP Location: Left Arm)   Pulse 98   Temp 97.8 F (36.6 C) (Oral)   Resp 18   Wt 89.8 kg   SpO2 90%   BMI 25.42 kg/m  Pain Scale: 0-10   Pain Score: 0-No pain   SpO2: SpO2: 90 % O2 Device:SpO2: 90 % O2 Flow Rate: .O2 Flow Rate (L/min): 3 L/min  IO: Intake/output summary:   Intake/Output Summary (Last 24 hours) at 01/25/2018 1231 Last data filed at 01/25/2018 0925 Gross per 24 hour  Intake 2489.78 ml  Output 1 ml   Net 2488.78 ml    LBM: Last BM Date: 01/24/18 Baseline Weight: Weight: 89.8 kg Most recent weight: Weight: 89.8 kg     Palliative Assessment/Data: PPS 20%    Time In: 1130 Time Out: 1300 Time Total: 90 min.   Greater than 50%  of this time was spent counseling and coordinating care related to the above assessment and plan.  Signed by: Alda Lea, AGPCNP-BC Palliative Medicine Team  Phone: [phone] Fax: [phone] Pager: [phone] Amion: Bjorn Pippin    Please contact Palliative Medicine Team phone at [phone] for questions and concerns.  For individual provider: See Shea Evans

## 2018-01-25 NOTE — Progress Notes (Signed)
CRITICAL VALUE STICKER  CRITICAL VALUE: k=2.3   RECEIVER (on-site recipient of call): Lanelle Bal, [employer] NOTIFIED: 01/25/18 0912  MESSENGER (representative from lab): Salome Spotted  MD NOTIFIED: MD Nevada Crane   TIME OF NOTIFICATION:0910  RESPONSE: called back and gave verbal orders for Mag. Sulfate 2 g once and Potassium PO 40 mEq x3

## 2018-01-26 LAB — CBC WITH DIFFERENTIAL/PLATELET
ABS IMMATURE GRANULOCYTES: 0.03 10*3/uL (ref 0.00–0.07)
Basophils Absolute: 0.1 10*3/uL (ref 0.0–0.1)
Basophils Relative: 1 %
Eosinophils Absolute: 0.4 10*3/uL (ref 0.0–0.5)
Eosinophils Relative: 4 %
HCT: 34.1 % — ABNORMAL LOW (ref 39.0–52.0)
Hemoglobin: 9.9 g/dL — ABNORMAL LOW (ref 13.0–17.0)
IMMATURE GRANULOCYTES: 0 %
Lymphocytes Relative: 4 %
Lymphs Abs: 0.3 10*3/uL — ABNORMAL LOW (ref 0.7–4.0)
MCH: 25.9 pg — ABNORMAL LOW (ref 26.0–34.0)
MCHC: 29 g/dL — ABNORMAL LOW (ref 30.0–36.0)
MCV: 89.3 fL (ref 80.0–100.0)
MONOS PCT: 6 %
Monocytes Absolute: 0.5 10*3/uL (ref 0.1–1.0)
NEUTROS PCT: 85 %
Neutro Abs: 7.8 10*3/uL — ABNORMAL HIGH (ref 1.7–7.7)
Platelets: 167 10*3/uL (ref 150–400)
RBC: 3.82 MIL/uL — ABNORMAL LOW (ref 4.22–5.81)
RDW: 19.1 % — ABNORMAL HIGH (ref 11.5–15.5)
WBC: 9.2 10*3/uL (ref 4.0–10.5)
nRBC: 0 % (ref 0.0–0.2)

## 2018-01-26 LAB — BASIC METABOLIC PANEL
Anion gap: 9 (ref 5–15)
BUN: 13 mg/dL (ref 8–23)
CO2: 29 mmol/L (ref 22–32)
Calcium: 7.4 mg/dL — ABNORMAL LOW (ref 8.9–10.3)
Chloride: 100 mmol/L (ref 98–111)
Creatinine, Ser: 0.8 mg/dL (ref 0.61–1.24)
GFR calc Af Amer: >60 mL/min (ref >60)
GFR calc non Af Amer: >60 mL/min (ref >60)
Glucose, Bld: 107 mg/dL — ABNORMAL HIGH (ref 70–99)
Potassium: 3.1 mmol/L — ABNORMAL LOW (ref 3.5–5.1)
SODIUM: 138 mmol/L (ref 135–145)

## 2018-01-26 LAB — PHOSPHORUS: Phosphorus: 2.2 mg/dL — ABNORMAL LOW (ref 2.5–4.6)

## 2018-01-26 LAB — MAGNESIUM: Magnesium: 1.4 mg/dL — ABNORMAL LOW (ref 1.7–2.4)

## 2018-01-26 MED ORDER — DILTIAZEM HCL 25 MG/5ML IV SOLN
10.0000 mg | Freq: Once | INTRAVENOUS | Status: AC
  Administered 2018-01-26: 10 mg via INTRAVENOUS
  Filled 2018-01-26: qty 5

## 2018-01-26 NOTE — Progress Notes (Signed)
PROGRESS NOTE  Travis Ross DVV:[id number] DOB: 09-27-1926 DOA: 01/24/2018 PCP: Virgel Bouquet, MD  HPI/Recap of past 24 hours: Travis Ross is a 84 y.o. male with h/o COPD, emphysema, hypertension, GERD, hyperlipidemia, hypothyroidism, chronic atrial fibrillation on anticoagulation, hearing loss and myasthenia gravis with chronic dysphagia who resides at skilled nursing facility and has had recurrent admissions for aspiration pneumonia (in December 2018, June 2019 and September 2019) brought in due to shortness of breath, hypoxia, persistent wet cough, and failure of outpatient treatment with Levaquin for pneumonia.  Patient is wheelchair-bound at baseline due to bilateral knee arthritis/old right femur fracture and chronic muscle weakness.  He however is mentally sound and able to communicate according to family members although currently appears somnolent and tired. Admitted for sepsis secondary to aspiration pneumonia.  01/25/2018: Patient examined at his bedside.  He has persistent wet productive cough.  Added pulmonary toilet with Mucinex 1200 mg twice daily, hypersaline nebs 3 times daily, and chest PT twice daily.  Denies chest pain.  01/26/2018: Patient seen and examined at bedside.  No acute events overnight.  He states his breathing is slightly improved.  He has no new complaints.  Assessment/Plan: Active Problems:   Hypokalemia   Pneumonia   Sepsis secondary to recurrent aspiration pneumonia Presented with leukocytosis with WBC 15,000, A. fib RVR with rate of 125 Obtain sputum culture Treat empirically with IV Zosyn Continue pulmonary toilet with Mucinex 1200 mg twice daily, hypersaline nebs 3 times daily Continue duo nebs every 6 hours Swallow evaluation by speech therapy who recommended dysphagia 2 diet  Acute hypoxic respiratory failure secondary to recurrent aspiration pneumonia Continue to supplement as needed Requiring oxygen at this time Maintain O2 saturation  greater than 92% Breathing treatment as stated above  Severe hypokalemia proving Potassium 3.1 from 2.3 Repleted with oral potassium 40 mEq and 60 mEq IV Repeat BMP in the morning  Hypomagnesemia Magnesium 1.4 Repleted with 4 g of IV magnesium  Chronic A. fib with resolved RVR Suspect RVR was secondary to sepsis physiology Rate controlled Resume anticoagulation Eliquis Continue metoprolol  History of cervical spinal cord compression Now with palliative care  Myasthenia gravis Resume home medications  Hypothyroidism Resume levothyroxine  Hyperlipidemia Resume Lipitor  Hypertension Blood pressure is well controlled Resume home antihypertensive medications  Ambulatory dysfunction PT to assess Fall precautions     DVT prophylaxis: Eliquis  Code Status: DNR as confirmed with family  Family Communication: none at bedside Consults called: Palliative care, Speech therapy  Disposition: Possible home in 1-2 days   Objective: Vitals:   01/26/18 0807 01/26/18 0850 01/26/18 0948 01/26/18 1450  BP: (!) 122/92  (!) 122/92   Pulse: 71  71   Resp: 16  16   Temp: 98.4 F (36.9 C)  98.4 F (36.9 C)   TempSrc: Oral  Oral   SpO2: 96% 95%  96%  Weight:   89.8 kg   Height:   6\' 1"  (1.854 m)     Intake/Output Summary (Last 24 hours) at 01/26/2018 1653 Last data filed at 01/26/2018 1452 Gross per 24 hour  Intake 1165.03 ml  Output 100 ml  Net 1065.03 ml   Filed Weights   01/24/18 2055 01/26/18 0948  Weight: 89.8 kg 89.8 kg    Exam:  . General: 84 y.o. year-old male well developed well nourished in no acute distress.  Alert and oriented times 3. . Cardiovascular: Irregular rate and rhythm with no rubs or gallops.  No JVD or thyromegaly noted.   Marland Kitchen  Respiratory: Mild rales bilaterally.  Good inspiratory effort. . Abdomen: Soft nontender nondistended with normal bowel sounds x4 quadrants. . Musculoskeletal:Trace lower extremity edema. 2/4 pulses in all 4  extremities. Marland Kitchen Psychiatry: Mood is appropriate for condition and setting   Data Reviewed: CBC: Recent Labs  Lab 01/24/18 1210 01/26/18 0721  WBC 15.1* 9.2  NEUTROABS 14.2* 7.8*  HGB 10.8* 9.9*  HCT 36.1* 34.1*  MCV 90.3 89.3  PLT 174 662   Basic Metabolic Panel: Recent Labs  Lab 01/24/18 1210 01/25/18 0757 01/26/18 0721  NA 140 139 138  K 2.2* 2.3* 3.1*  CL 97* 100 100  CO2 31 29 29   GLUCOSE 125* 106* 107*  BUN 9 13 13   CREATININE 0.96 0.86 0.80  CALCIUM 7.2* 6.8* 7.4*  MG  --   --  1.4*  PHOS  --   --  2.2*   GFR: Estimated Creatinine Clearance: 70.7 mL/min (by C-G formula based on SCr of 0.8 mg/dL). Liver Function Tests: Recent Labs  Lab 01/24/18 1210  AST 27  ALT 13  ALKPHOS 138*  BILITOT 1.2  PROT 5.2*  ALBUMIN 2.3*   No results for input(s): LIPASE, AMYLASE in the last 168 hours. No results for input(s): AMMONIA in the last 168 hours. Coagulation Profile: No results for input(s): INR, PROTIME in the last 168 hours. Cardiac Enzymes: No results for input(s): CKTOTAL, CKMB, CKMBINDEX, TROPONINI in the last 168 hours. BNP (last 3 results) No results for input(s): PROBNP in the last 8760 hours. HbA1C: No results for input(s): HGBA1C in the last 72 hours. CBG: No results for input(s): GLUCAP in the last 168 hours. Lipid Profile: No results for input(s): CHOL, HDL, LDLCALC, TRIG, CHOLHDL, LDLDIRECT in the last 72 hours. Thyroid Function Tests: No results for input(s): TSH, T4TOTAL, FREET4, T3FREE, THYROIDAB in the last 72 hours. Anemia Panel: No results for input(s): VITAMINB12, FOLATE, FERRITIN, TIBC, IRON, RETICCTPCT in the last 72 hours. Urine analysis:    Component Value Date/Time   COLORURINE AMBER (A) 01/25/2018 0140   APPEARANCEUR CLEAR 01/25/2018 0140   LABSPEC 1.025 01/25/2018 0140   PHURINE 5.0 01/25/2018 0140   GLUCOSEU NEGATIVE 01/25/2018 0140   HGBUR NEGATIVE 01/25/2018 0140   BILIRUBINUR SMALL (A) 01/25/2018 0140   KETONESUR 5 (A)  01/25/2018 0140   PROTEINUR 30 (A) 01/25/2018 0140   NITRITE NEGATIVE 01/25/2018 0140   LEUKOCYTESUR TRACE (A) 01/25/2018 0140   Sepsis Labs: @LABRCNTIP (procalcitonin:4,lacticidven:4)  ) Recent Results (from the past 240 hour(s))  Blood Culture (routine x 2)     Status: None (Preliminary result)   Collection Time: 01/24/18 12:10 PM  Result Value Ref Range Status   Specimen Description BLOOD BLOOD RIGHT FOREARM  Final   Special Requests   Final    BOTTLES DRAWN AEROBIC AND ANAEROBIC Blood Culture results may not be optimal due to an excessive volume of blood received in culture bottles   Culture   Final    NO GROWTH 2 DAYS Performed at [facility], [place] [address], [place], [place] [address]    Report Status PENDING  Incomplete  Blood Culture (routine x 2)     Status: None (Preliminary result)   Collection Time: 01/24/18 12:15 PM  Result Value Ref Range Status   Specimen Description BLOOD RIGHT ANTECUBITAL  Final   Special Requests   Final    BOTTLES DRAWN AEROBIC AND ANAEROBIC Blood Culture adequate volume   Culture   Final    NO GROWTH 2 DAYS Performed at  [facility], [place] [address], [place], [place] [address]    Report Status PENDING  Incomplete  MRSA PCR Screening     Status: None   Collection Time: 01/24/18  6:01 PM  Result Value Ref Range Status   MRSA by PCR NEGATIVE NEGATIVE Final    Comment:        The GeneXpert MRSA Assay (FDA approved for NASAL specimens only), is one component of a comprehensive MRSA colonization surveillance program. It is not intended to diagnose MRSA infection nor to guide or monitor treatment for MRSA infections. Performed at [facility], [place] [address] [place],  [address]       Studies: No results found.  Scheduled Meds: . apixaban  5 mg Oral BID  . atorvastatin  10 mg Oral QPM  . azaTHIOprine  50 mg Oral BID  . brimonidine  1 drop Both Eyes BID  . brinzolamide  1 drop Both Eyes BID  . docusate  sodium  100 mg Oral BID  . fluticasone  1 spray Each Nare Daily  . guaiFENesin  1,200 mg Oral BID  . ipratropium-albuterol  3 mL Nebulization TID  . latanoprost  1 drop Both Eyes QHS  . levothyroxine  200 mcg Oral QAC breakfast  . losartan  12.5 mg Oral Daily  . mouth rinse  15 mL Mouth Rinse BID  . metoprolol tartrate  25 mg Oral BID  . mometasone-formoterol  2 puff Inhalation BID  . morphine  15 mg Oral BID  . pantoprazole  40 mg Oral BID  . polycarbophil  625 mg Oral QHS  . sertraline  50 mg Oral Daily  . sodium chloride HYPERTONIC  4 mL Nebulization TID    Continuous Infusions: . sodium chloride 250 mL (01/25/18 0325)  . piperacillin-tazobactam (ZOSYN)  IV 3.375 g (01/26/18 1049)     LOS: 2 days     Kayleen Memos, MD [facility] Pager [phone]  If 7PM-7AM, please contact night-coverage [email or web address] Password [email or web address] 01/26/2018, 4:53 PM

## 2018-01-26 NOTE — Discharge Instructions (Signed)

## 2018-01-26 NOTE — Consult Note (Signed)
[facility] Nurse wound consult note Patient evaluated in [id number].  No family present. Reason for Consult: Right great toe and right 3rd toe wounds Wound type: The right great toe are is callused and likely related to an abrasion. This area is darkened and dry.   The right 3rd toe wound is approximately 1 cm x 0.5 cm, superficial, and most likely related to the rubbing of the 2nd and 3rd toes.  This area is moist. Dressing procedure/placement/frequency: For the great toe, apply betadine and allow to air dry, perform each shift.  For the right 3rd toe, apply betadine to the right 3rd toe wound between the 2nd and 3rd toes.  Allow to air dry.  Place a small piece of Aquacel Ag+ Kellie Simmering # [phone]) to the wound on the 3rd toe.  Secure with a small piece of gauze.  Change daily.  Additionally, I have ordered the use of Prevalon boots bilaterally. Monitor the wound area(s) for worsening of condition such as: Signs/symptoms of infection,  Increase in size,  Development of or worsening of odor, Development of pain, or increased pain at the affected locations.  Notify the medical team if any of these develop.  Thank you for the consult.  Discussed plan of care with the patient and bedside nurse.  [facility] nurse will not follow at this time.  Please re-consult the [place] team if needed.  Val Riles, RN, MSN, CWOCN, CNS-BC, pager [phone]

## 2018-01-26 NOTE — Progress Notes (Addendum)
Nutrition Brief Note  RD drawn to pt for positive MST score. Chart reviewed.  84 year old male who presented to the ED on 11/27 with PNA. PMH significant for COPD, hypertension, myasthenia gravis, GERD, hyperlipidemia, emphysema, hypothyroidism, atrial fibrillation, and chronic dysphagia. Pt is wheelchair-bound at baseline.  Per palliative care note, plan is to treat PNA with no further escalation of care and no re-hospitalizations. Pt to discharge to [facility] place with hospice services. No plan for IVF or PEG.  No further nutrition interventions warranted at this time. Will d/c order for Ensure Enlive.  Please consult as needed.   Gaynell Face, MS, RD, LDN Inpatient Clinical Dietitian Pager: [phone] Weekend/After Hours: [phone]

## 2018-01-27 LAB — BASIC METABOLIC PANEL
Anion gap: 10 (ref 5–15)
BUN: 12 mg/dL (ref 8–23)
CALCIUM: 7.6 mg/dL — AB (ref 8.9–10.3)
CO2: 29 mmol/L (ref 22–32)
Chloride: 99 mmol/L (ref 98–111)
Creatinine, Ser: 0.68 mg/dL (ref 0.61–1.24)
GFR calc Af Amer: >60 mL/min (ref >60)
Glucose, Bld: 107 mg/dL — ABNORMAL HIGH (ref 70–99)
Potassium: 3.3 mmol/L — ABNORMAL LOW (ref 3.5–5.1)
Sodium: 138 mmol/L (ref 135–145)

## 2018-01-27 LAB — MAGNESIUM: Magnesium: 1.4 mg/dL — ABNORMAL LOW (ref 1.7–2.4)

## 2018-01-27 MED ORDER — POTASSIUM PHOSPHATES 15 MMOLE/5ML IV SOLN
40.0000 meq | Freq: Once | INTRAVENOUS | Status: AC
  Administered 2018-01-27: 40 meq via INTRAVENOUS
  Filled 2018-01-27: qty 9.09

## 2018-01-27 MED ORDER — METHYLPREDNISOLONE SODIUM SUCC 125 MG IJ SOLR
60.0000 mg | Freq: Three times a day (TID) | INTRAMUSCULAR | Status: DC
  Administered 2018-01-27 – 2018-01-29 (×7): 60 mg via INTRAVENOUS
  Filled 2018-01-27 (×7): qty 2

## 2018-01-27 MED ORDER — IPRATROPIUM-ALBUTEROL 0.5-2.5 (3) MG/3ML IN SOLN
3.0000 mL | Freq: Two times a day (BID) | RESPIRATORY_TRACT | Status: DC
  Administered 2018-01-27 – 2018-01-30 (×6): 3 mL via RESPIRATORY_TRACT
  Filled 2018-01-27 (×6): qty 3

## 2018-01-27 MED ORDER — SODIUM CHLORIDE 3 % IN NEBU
4.0000 mL | INHALATION_SOLUTION | Freq: Two times a day (BID) | RESPIRATORY_TRACT | Status: DC
  Administered 2018-01-27 – 2018-01-30 (×5): 4 mL via RESPIRATORY_TRACT
  Filled 2018-01-27 (×7): qty 4

## 2018-01-27 MED ORDER — POTASSIUM CHLORIDE CRYS ER 20 MEQ PO TBCR
40.0000 meq | EXTENDED_RELEASE_TABLET | Freq: Once | ORAL | Status: AC
  Administered 2018-01-27: 40 meq via ORAL
  Filled 2018-01-27: qty 2

## 2018-01-27 MED ORDER — MAGNESIUM SULFATE 4 GM/100ML IV SOLN
4.0000 g | Freq: Once | INTRAVENOUS | Status: AC
  Administered 2018-01-27: 4 g via INTRAVENOUS
  Filled 2018-01-27: qty 100

## 2018-01-27 NOTE — NC FL2 (Signed)
[place] MEDICAID FL2 LEVEL OF CARE SCREENING TOOL     IDENTIFICATION  Patient Name: Travis Ross Birthdate: December 30, 1926 Sex: male Admission Date (Current Location): 01/24/2018  [facility] and [place] Number:  Herbalist and Address:  [facility], [place] [address] [place], [place] [address]      Provider Number: [id number]  Attending Physician Name and Address:  Kayleen Memos, DO  Relative Name and Phone Number:  Truett Perna, daughter, [phone]    Current Level of Care: Hospital Recommended Level of Care: [place] Prior Approval Number:    Date Approved/Denied:   PASRR Number: [id number]  Discharge Plan: SNF    Current Diagnoses: Patient Active Problem List   Diagnosis Date Noted  . Pressure injury of skin 11/05/2017  . Pneumonia 11/01/2017  . Chronic hypercapnic respiratory failure ([place]) 09/01/2017  . Aspiration pneumonia of both lungs (HCC)/ chronic/ recurrent 08/29/2017  . Acute respiratory failure with hypoxia ([place]) 08/15/2017  . Hyperlipidemia 08/15/2017  . COPD with chronic bronchitis and emphysema ([employer]) 08/15/2017  . GERD (gastroesophageal reflux disease) 08/15/2017  . Hypothyroidism (acquired) 08/15/2017  . History of Cervical spinal cord compression (HCC) 08/15/2017  . Myasthenia gravis ([place]) 08/15/2017  . Acute on chronic systolic heart failure ([place]) 08/15/2017  . Hypokalemia 08/15/2017  . Decubitus ulcer of sacral region, stage 1 08/15/2017  . Pressure injury of sacral region, stage 2 ([place]) 02/06/2017  . Acute on chronic systolic (congestive) heart failure ([place]) 02/06/2017  . Community acquired pneumonia 02/04/2017  . A-fib ([place])   . Periprosthetic fracture around internal prosthetic right knee joint 12/15/2016  . Failed total knee arthroplasty ([place]) 12/15/2016  . Permanent atrial fibrillation 12/05/2016  . Pre-operative cardiovascular examination 12/05/2016  . Venous stasis of both lower  extremities 12/05/2016  . Essential hypertension     Orientation RESPIRATION BLADDER Height & Weight     Self, Place  O2(3L nasal canula) Incontinent Weight: 197 lb 15.6 oz (89.8 kg) Height:  6\' 1"  (185.4 cm)  BEHAVIORAL SYMPTOMS/MOOD NEUROLOGICAL BOWEL NUTRITION STATUS      Continent Diet(see discharge summary)  AMBULATORY STATUS COMMUNICATION OF NEEDS Skin   Extensive Assist Verbally PU Stage and Appropriate Care, Other (Comment) PU Stage 1 Dressing: (on sacrum with foam dressing PRN) PU Stage 2 Dressing: Daily(on left buttock with betadine daily )                   Personal Care Assistance Level of Assistance  Bathing, Feeding, Dressing Bathing Assistance: Maximum assistance Feeding assistance: Maximum assistance Dressing Assistance: Maximum assistance     Functional Limitations Info  Sight, Hearing, Speech Sight Info: Adequate Hearing Info: Adequate Speech Info: Adequate    SPECIAL CARE FACTORS FREQUENCY                       Contractures Contractures Info: Not present    Additional Factors Info  Code Status, Allergies, Psychotropic Code Status Info: DNR Allergies Info: No Known Allergies Psychotropic Info: sertraline (ZOLOFT) tablet 50 mg daily PO         Current Medications (01/27/2018):  This is the current hospital active medication list Current Facility-Administered Medications  Medication Dose Route Frequency Provider Last Rate Last Dose  . 0.9 %  sodium chloride infusion   Intravenous PRN Guilford Shi, MD 10 mL/hr at 01/25/18 0325 250 mL at 01/25/18 0325  . acetaminophen (TYLENOL) tablet 650 mg  650 mg Oral Q6H PRN Guilford Shi, MD      .  albuterol (PROVENTIL) (2.5 MG/3ML) 0.083% nebulizer solution 2.5 mg  2.5 mg Nebulization Q2H PRN Rosenthal, Amy, MD      . apixaban (ELIQUIS) tablet 5 mg  5 mg Oral BID Guilford Shi, MD   5 mg at 01/27/18 0952  . atorvastatin (LIPITOR) tablet 10 mg  10 mg Oral QPM Guilford Shi, MD   10 mg  at 01/26/18 1712  . azaTHIOprine (IMURAN) tablet 50 mg  50 mg Oral BID Guilford Shi, MD   50 mg at 01/27/18 0953  . brimonidine (ALPHAGAN) 0.2 % ophthalmic solution 1 drop  1 drop Both Eyes BID Guilford Shi, MD   1 drop at 01/27/18 1006  . brinzolamide (AZOPT) 1 % ophthalmic suspension 1 drop  1 drop Both Eyes BID Guilford Shi, MD   1 drop at 01/27/18 1006  . docusate sodium (COLACE) capsule 100 mg  100 mg Oral BID Guilford Shi, MD   100 mg at 01/27/18 0953  . fluticasone (FLONASE) 50 MCG/ACT nasal spray 1 spray  1 spray Each Nare Daily Guilford Shi, MD   1 spray at 01/27/18 1009  . food thickener (THICK IT) powder   Oral PRN Guilford Shi, MD      . guaiFENesin (MUCINEX) 12 hr tablet 1,200 mg  1,200 mg Oral BID Irene Pap N, DO   1,200 mg at 01/27/18 0240  . hydrOXYzine (ATARAX/VISTARIL) tablet 10 mg  10 mg Oral TID PRN Guilford Shi, MD   10 mg at 01/26/18 2125  . ipratropium-albuterol (DUONEB) 0.5-2.5 (3) MG/3ML nebulizer solution 3 mL  3 mL Nebulization BID Hall, Carole N, DO      . latanoprost (XALATAN) 0.005 % ophthalmic solution 1 drop  1 drop Both Eyes QHS Guilford Shi, MD   1 drop at 01/26/18 2126  . levothyroxine (SYNTHROID, LEVOTHROID) tablet 200 mcg  200 mcg Oral QAC breakfast Guilford Shi, MD   200 mcg at 01/27/18 0818  . magnesium hydroxide (MILK OF MAGNESIA) suspension 30 mL  30 mL Oral Daily PRN Guilford Shi, MD      . MEDLINE mouth rinse  15 mL Mouth Rinse BID Guilford Shi, MD   15 mL at 01/27/18 1009  . methylPREDNISolone sodium succinate (SOLU-MEDROL) 125 mg/2 mL injection 60 mg  60 mg Intravenous Q8H Hall, Carole N, DO   60 mg at 01/27/18 0819  . metoprolol tartrate (LOPRESSOR) tablet 25 mg  25 mg Oral BID Guilford Shi, MD   25 mg at 01/27/18 0953  . mometasone-formoterol (DULERA) 100-5 MCG/ACT inhaler 2 puff  2 puff Inhalation BID Guilford Shi, MD   2 puff at 01/27/18 0846  . morphine (MS CONTIN) 12 hr tablet 15 mg   15 mg Oral BID Guilford Shi, MD   15 mg at 01/27/18 0953  . ondansetron (ZOFRAN) tablet 4 mg  4 mg Oral Q6H PRN Guilford Shi, MD       Or  . ondansetron (ZOFRAN) injection 4 mg  4 mg Intravenous Q6H PRN Kamineni, Neelima, MD      . pantoprazole (PROTONIX) EC tablet 40 mg  40 mg Oral BID Guilford Shi, MD   40 mg at 01/27/18 0952  . piperacillin-tazobactam (ZOSYN) IVPB 3.375 g  3.375 g Intravenous Q8H Rumbarger, Rachel L, RPH 12.5 mL/hr at 01/27/18 0400 3.375 g at 01/27/18 0400  . polycarbophil (FIBERCON) tablet 625 mg  625 mg Oral QHS Guilford Shi, MD   625 mg at 01/26/18 2125  . potassium PHOSPHATE 40 mEq in dextrose 5 % 500  mL infusion  40 mEq Intravenous Once Kayleen Memos, DO 85 mL/hr at 01/27/18 0612 40 mEq at 01/27/18 0612  . senna (SENOKOT) tablet 8.6 mg  1 tablet Oral QHS PRN Guilford Shi, MD      . sertraline (ZOLOFT) tablet 50 mg  50 mg Oral Daily Guilford Shi, MD   50 mg at 01/27/18 0953  . sodium chloride HYPERTONIC 3 % nebulizer solution 4 mL  4 mL Nebulization BID Hall, Carole N, DO      . temazepam (RESTORIL) capsule 30 mg  30 mg Oral QHS PRN Guilford Shi, MD   30 mg at 01/26/18 2126     Discharge Medications: Please see discharge summary for a list of discharge medications.  Relevant Imaging Results:  Relevant Lab Results:   Additional Information [phone]; requesting hospice services at [facility] [place]

## 2018-01-27 NOTE — Progress Notes (Signed)
PROGRESS NOTE  Travis Ross XBM:[id number] DOB: 08-Apr-1926 DOA: 01/24/2018 PCP: Virgel Bouquet, MD  HPI/Recap of past 24 hours: Travis Ross is a 84 y.o. male with h/o COPD, emphysema, hypertension, GERD, hyperlipidemia, hypothyroidism, chronic atrial fibrillation on anticoagulation, hearing loss and myasthenia gravis with chronic dysphagia who resides at skilled nursing facility and has had recurrent admissions for aspiration pneumonia (in December 2018, June 2019 and September 2019) brought in due to shortness of breath, hypoxia, persistent wet cough, and failure of outpatient treatment with Levaquin for pneumonia.  Patient is wheelchair-bound at baseline due to bilateral knee arthritis/old right femur fracture and chronic muscle weakness.  He however is mentally sound and able to communicate according to family members although currently appears somnolent and tired. Admitted for sepsis secondary to aspiration pneumonia.  01/25/2018: Productive cough.  Added pulmonary toilet with Mucinex 1200 mg twice daily, hypersaline nebs 3 times daily. 01/26/2018: Breathing is slightly improved.   01/27/2018: Patient seen and examined at his bedside.  No acute events overnight.  States he is breathing better.  Rate of 120 this morning.  Will repeat vital signs and address accordingly.  He denies any chest pain palpitations or dyspnea at rest.   Assessment/Plan: Active Problems:   Hypokalemia   Pneumonia   Sepsis secondary to recurrent aspiration pneumonia Presented with leukocytosis with WBC 15,000, A. fib RVR with rate of 125 Obtain sputum culture Treat empirically with IV Zosyn Continue pulmonary toilet with Mucinex 1200 mg twice daily, hypersaline nebs 3 times daily Continue duo nebs every 6 hours Swallow evaluation by speech therapy who recommended dysphagia 2 diet  Acute hypoxic respiratory failure secondary to recurrent aspiration pneumonia Continue to supplement as needed Requiring  oxygen at this time Maintain O2 saturation greater than 92% Breathing treatment as stated above  Severe hypokalemia, improving Potassium 3.3 from 3.1 from 2.3 Supplemented with 40 mEq of KCl once Repeat BMP in the morning  Hypomagnesemia Magnesium 1.4 Replete with 4 g of IV magnesium supplement Repeat magnesium level in the morning Pete phosphorus level in the morning  Hypophosphatemia Repleted Repeat level in the morning  Chronic A. fib with resolved RVR Suspect RVR was secondary to sepsis physiology Rate controlled Resume anticoagulation Eliquis Continue metoprolol  History of cervical spinal cord compression Now with palliative care  Myasthenia gravis Resume home medications  Hypothyroidism Resume levothyroxine  Hyperlipidemia Resume Lipitor  Hypertension Blood pressure is well controlled Resume home antihypertensive medications  Ambulatory dysfunction PT to assess Fall precautions     DVT prophylaxis: Eliquis  Code Status: DNR as confirmed with family  Family Communication: none at bedside Consults called: Palliative care, Speech therapy  Disposition: Possible home tomorrow 01/28/2018   Objective: Vitals:   01/26/18 2251 01/26/18 2259 01/27/18 0412 01/27/18 0822  BP: 127/80 (!) 117/94 (!) 90/51   Pulse: (!) 190 89 (!) 105 91  Resp:   14 16  Temp:   98 F (36.7 C)   TempSrc:   Oral   SpO2:  (!) 88% 91% 92%  Weight:      Height:        Intake/Output Summary (Last 24 hours) at 01/27/2018 1436 Last data filed at 01/27/2018 0900 Gross per 24 hour  Intake 600 ml  Output 200 ml  Net 400 ml   Filed Weights   01/24/18 2055 01/26/18 0948  Weight: 89.8 kg 89.8 kg    Exam:  . General: 84 y.o. year-old male well-developed well-nourished in no acute distress.  Alert and oriented  x3.   . Cardiovascular: Irregular rate and rhythm with no rubs or gallops.  No JVD or thyromegaly noted.   Marland Kitchen Respiratory: Mild rales at bases.  Mild wheezes  bilaterally.  Good inspiratory effort. . Abdomen: Soft nontender nondistended with normal bowel sounds x4 quadrants. . Musculoskeletal:Trace lower extremity edema. 2/4 pulses in all 4 extremities. Marland Kitchen Psychiatry: Mood is appropriate for condition and setting   Data Reviewed: CBC: Recent Labs  Lab 01/24/18 1210 01/26/18 0721  WBC 15.1* 9.2  NEUTROABS 14.2* 7.8*  HGB 10.8* 9.9*  HCT 36.1* 34.1*  MCV 90.3 89.3  PLT 174 267   Basic Metabolic Panel: Recent Labs  Lab 01/24/18 1210 01/25/18 0757 01/26/18 0721 01/27/18 0606  NA 140 139 138 138  K 2.2* 2.3* 3.1* 3.3*  CL 97* 100 100 99  CO2 31 29 29 29   GLUCOSE 125* 106* 107* 107*  BUN 9 13 13 12   CREATININE 0.96 0.86 0.80 0.68  CALCIUM 7.2* 6.8* 7.4* 7.6*  MG  --   --  1.4* 1.4*  PHOS  --   --  2.2*  --    GFR: Estimated Creatinine Clearance: 70.7 mL/min (by C-G formula based on SCr of 0.68 mg/dL). Liver Function Tests: Recent Labs  Lab 01/24/18 1210  AST 27  ALT 13  ALKPHOS 138*  BILITOT 1.2  PROT 5.2*  ALBUMIN 2.3*   No results for input(s): LIPASE, AMYLASE in the last 168 hours. No results for input(s): AMMONIA in the last 168 hours. Coagulation Profile: No results for input(s): INR, PROTIME in the last 168 hours. Cardiac Enzymes: No results for input(s): CKTOTAL, CKMB, CKMBINDEX, TROPONINI in the last 168 hours. BNP (last 3 results) No results for input(s): PROBNP in the last 8760 hours. HbA1C: No results for input(s): HGBA1C in the last 72 hours. CBG: No results for input(s): GLUCAP in the last 168 hours. Lipid Profile: No results for input(s): CHOL, HDL, LDLCALC, TRIG, CHOLHDL, LDLDIRECT in the last 72 hours. Thyroid Function Tests: No results for input(s): TSH, T4TOTAL, FREET4, T3FREE, THYROIDAB in the last 72 hours. Anemia Panel: No results for input(s): VITAMINB12, FOLATE, FERRITIN, TIBC, IRON, RETICCTPCT in the last 72 hours. Urine analysis:    Component Value Date/Time   COLORURINE AMBER (A)  01/25/2018 0140   APPEARANCEUR CLEAR 01/25/2018 0140   LABSPEC 1.025 01/25/2018 0140   PHURINE 5.0 01/25/2018 0140   GLUCOSEU NEGATIVE 01/25/2018 0140   HGBUR NEGATIVE 01/25/2018 0140   BILIRUBINUR SMALL (A) 01/25/2018 0140   KETONESUR 5 (A) 01/25/2018 0140   PROTEINUR 30 (A) 01/25/2018 0140   NITRITE NEGATIVE 01/25/2018 0140   LEUKOCYTESUR TRACE (A) 01/25/2018 0140   Sepsis Labs: @LABRCNTIP (procalcitonin:4,lacticidven:4)  ) Recent Results (from the past 240 hour(s))  Blood Culture (routine x 2)     Status: None (Preliminary result)   Collection Time: 01/24/18 12:10 PM  Result Value Ref Range Status   Specimen Description BLOOD BLOOD RIGHT FOREARM  Final   Special Requests   Final    BOTTLES DRAWN AEROBIC AND ANAEROBIC Blood Culture results may not be optimal due to an excessive volume of blood received in culture bottles   Culture   Final    NO GROWTH 3 DAYS Performed at [facility], [place] [address], [place], [place] [address]    Report Status PENDING  Incomplete  Blood Culture (routine x 2)     Status: None (Preliminary result)   Collection Time: 01/24/18 12:15 PM  Result Value Ref Range Status  Specimen Description BLOOD RIGHT ANTECUBITAL  Final   Special Requests   Final    BOTTLES DRAWN AEROBIC AND ANAEROBIC Blood Culture adequate volume   Culture   Final    NO GROWTH 3 DAYS Performed at [facility], [address] [place], [place] [address]    Report Status PENDING  Incomplete  MRSA PCR Screening     Status: None   Collection Time: 01/24/18  6:01 PM  Result Value Ref Range Status   MRSA by PCR NEGATIVE NEGATIVE Final    Comment:        The GeneXpert MRSA Assay (FDA approved for NASAL specimens only), is one component of a comprehensive MRSA colonization surveillance program. It is not intended to diagnose MRSA infection nor to guide or monitor treatment for MRSA infections. Performed at [facility], [place] [address], [place], [place]  [address]       Studies: No results found.  Scheduled Meds: . apixaban  5 mg Oral BID  . atorvastatin  10 mg Oral QPM  . azaTHIOprine  50 mg Oral BID  . brimonidine  1 drop Both Eyes BID  . brinzolamide  1 drop Both Eyes BID  . docusate sodium  100 mg Oral BID  . fluticasone  1 spray Each Nare Daily  . guaiFENesin  1,200 mg Oral BID  . ipratropium-albuterol  3 mL Nebulization BID  . latanoprost  1 drop Both Eyes QHS  . levothyroxine  200 mcg Oral QAC breakfast  . mouth rinse  15 mL Mouth Rinse BID  . methylPREDNISolone (SOLU-MEDROL) injection  60 mg Intravenous Q8H  . metoprolol tartrate  25 mg Oral BID  . mometasone-formoterol  2 puff Inhalation BID  . morphine  15 mg Oral BID  . pantoprazole  40 mg Oral BID  . polycarbophil  625 mg Oral QHS  . sertraline  50 mg Oral Daily  . sodium chloride HYPERTONIC  4 mL Nebulization BID    Continuous Infusions: . sodium chloride 250 mL (01/25/18 0325)  . piperacillin-tazobactam (ZOSYN)  IV 3.375 g (01/27/18 1221)     LOS: 3 days     Kayleen Memos, MD [facility] Pager [phone]  If 7PM-7AM, please contact night-coverage [email or web address] Password [email or web address] 01/27/2018, 2:36 PM

## 2018-01-27 NOTE — Progress Notes (Signed)
PT was id using his name, DOB, and MRN

## 2018-01-28 ENCOUNTER — Inpatient Hospital Stay (HOSPITAL_COMMUNITY): Payer: Medicare HMO

## 2018-01-28 DIAGNOSIS — R0902 Hypoxemia: Secondary | ICD-10-CM

## 2018-01-28 LAB — BLOOD GAS, ARTERIAL
Acid-Base Excess: 5.7 mmol/L — ABNORMAL HIGH (ref 0.0–2.0)
Bicarbonate: 30.1 mmol/L — ABNORMAL HIGH (ref 20.0–28.0)
Drawn by: 345601
O2 Content: 3 L/min
O2 Saturation: 91.9 %
PATIENT TEMPERATURE: 98.6
pCO2 arterial: 47.4 mmHg (ref 32.0–48.0)
pH, Arterial: 7.42 (ref 7.350–7.450)
pO2, Arterial: 63.9 mmHg — ABNORMAL LOW (ref 83.0–108.0)

## 2018-01-28 LAB — MAGNESIUM: Magnesium: 2.2 mg/dL (ref 1.7–2.4)

## 2018-01-28 LAB — BASIC METABOLIC PANEL
Anion gap: 11 (ref 5–15)
BUN: 18 mg/dL (ref 8–23)
CHLORIDE: 97 mmol/L — AB (ref 98–111)
CO2: 29 mmol/L (ref 22–32)
Calcium: 8.2 mg/dL — ABNORMAL LOW (ref 8.9–10.3)
Creatinine, Ser: 0.78 mg/dL (ref 0.61–1.24)
GFR calc Af Amer: >60 mL/min (ref >60)
GFR calc non Af Amer: >60 mL/min (ref >60)
GLUCOSE: 158 mg/dL — AB (ref 70–99)
Potassium: 3.5 mmol/L (ref 3.5–5.1)
Sodium: 137 mmol/L (ref 135–145)

## 2018-01-28 LAB — PROCALCITONIN: Procalcitonin: 0.11 ng/mL

## 2018-01-28 LAB — PHOSPHORUS: Phosphorus: 3 mg/dL (ref 2.5–4.6)

## 2018-01-28 MED ORDER — FUROSEMIDE 10 MG/ML IJ SOLN
20.0000 mg | Freq: Once | INTRAMUSCULAR | Status: AC
  Administered 2018-01-28: 20 mg via INTRAVENOUS
  Filled 2018-01-28: qty 2

## 2018-01-28 NOTE — Progress Notes (Signed)
Patient and his daughters stated that they talked with Dr. Nevada Crane and they have decided to proceed with the thoracentesis. MD notified.

## 2018-01-28 NOTE — Progress Notes (Signed)
PROGRESS NOTE  Travis Ross FUX:[id number] DOB: 1926-11-23 DOA: 01/24/2018 PCP: Virgel Bouquet, MD  HPI/Recap of past 24 hours: Travis Ross is a 84 y.o. male with h/o COPD, emphysema, hypertension, GERD, hyperlipidemia, hypothyroidism, chronic atrial fibrillation on anticoagulation, hearing loss and myasthenia gravis with chronic dysphagia who resides at skilled nursing facility and has had recurrent admissions for aspiration pneumonia (in December 2018, June 2019 and September 2019) brought in due to shortness of breath, hypoxia, persistent wet cough, and failure of outpatient treatment with Levaquin for pneumonia.  Patient is wheelchair-bound at baseline due to bilateral knee arthritis/old right femur fracture and chronic muscle weakness.  He however is mentally sound and able to communicate according to family members although currently appears somnolent and tired. Admitted for sepsis secondary to aspiration pneumonia.  01/25/2018: Productive cough.  Added pulmonary toilet with Mucinex 1200 mg twice daily, hypersaline nebs 3 times daily. 01/26/2018: Breathing is slightly improved.   01/27/2018: Patient seen and examined at his bedside.  No acute events overnight.  States he is breathing better.  Rate of 120 this morning.  Will repeat vital signs and address accordingly.  He denies any chest pain palpitations or dyspnea at rest.  01/28/2018: Patient seen and examined at his bedside.  Intermittent hypoxia noted.  Spoke to the patient's daughter to give her an update.  Explained to her that the patient has a really poor prognosis.  Repeated a chest x-ray which revealed worsening pulmonary edema and possible left pleural effusion.  Patient's daughter requested thoracentesis to be done even knowing that his prognosis is still very poor in the setting of recurrent aspiration.   Assessment/Plan: Active Problems:   Hypokalemia   Pneumonia   Sepsis secondary to recurrent aspiration  pneumonia Presented with leukocytosis with WBC 15,000, A. fib RVR with rate of 125 Treated empirically with IV Zosyn day number 5 out of 7 Continue pulmonary toilet with Mucinex 1200 mg twice daily, hypersaline nebs 3 times daily Continue duo nebs every 6 hours Completed swallow evaluation by speech therapy who recommended dysphagia 2 diet  Worsening pulmonary edema with suspected left pleural effusion Independently reviewed chest x-ray done on 01/28/2018 which revealed worsening pulmonary edema with suspected left pleural effusion Explained prognosis to daughter which is poor in the setting of recurrent aspiration Daughter requested thoracentesis US thoracentesis ordered Possible discharge with hospice after thoracentesis, if can it be done by interventional radiology Given 1 dose of 20 mg of IV Lasix Blood pressure has been soft  Acute hypoxic respiratory failure secondary to recurrent aspiration pneumonia, pulmonary edema, possible left pleural effusion Continue to supplement oxygen as needed Requiring oxygen at this time Maintain O2 saturation greater than 92% Breathing treatment as stated above  Resolved hypokalemia post repletion  Resolved hypomagnesemia post repletion   Hypophosphatemia Repleted  Chronic A. fib with resolved RVR Suspect RVR was secondary to sepsis physiology Rate controlled Continue anticoagulation Eliquis Continue metoprolol  History of cervical spinal cord compression Now with palliative care  Myasthenia gravis Resume home medications  Hypothyroidism Resume levothyroxine  Hyperlipidemia Resume Lipitor  Hypertension Blood pressure is well controlled Resume home antihypertensive medications  Ambulatory dysfunction PT to assess Fall precautions     DVT prophylaxis: Eliquis  Code Status: DNR as confirmed with family  Family Communication: none at bedside Consults called: Palliative care, Speech therapy  Disposition: Possible home  tomorrow 01/29/2018   Objective: Vitals:   01/28/18 0910 01/28/18 0922 01/28/18 0941 01/28/18 1715  BP:  119/83  125/79  Pulse:  (!) 43 (!) 125 (!) 122  Resp:  18  18  Temp:  97.6 F (36.4 C)  98.1 F (36.7 C)  TempSrc:  Oral  Oral  SpO2: 94% 94%  93%  Weight:      Height:        Intake/Output Summary (Last 24 hours) at 01/28/2018 1731 Last data filed at 01/28/2018 1715 Gross per 24 hour  Intake 895 ml  Output 875 ml  Net 20 ml   Filed Weights   01/24/18 2055 01/26/18 0948  Weight: 89.8 kg 89.8 kg    Exam:  . General: 84 y.o. year-old male well-developed well-nourished in no acute distress.  Alert and oriented x3. . Cardiovascular: Irregular rate and rhythm with no rubs or gallops.  R JVD noted.  No thyromegaly noted. Marland Kitchen Respiratory: Mild rales at bases.  Poor inspiratory effort. . Abdomen: Soft nontender nondistended with normal bowel sounds x4 quadrants. . Musculoskeletal:Trace lower extremity edema. 2/4 pulses in all 4 extremities. Marland Kitchen Psychiatry: Mood is appropriate for condition and setting   Data Reviewed: CBC: Recent Labs  Lab 01/24/18 1210 01/26/18 0721  WBC 15.1* 9.2  NEUTROABS 14.2* 7.8*  HGB 10.8* 9.9*  HCT 36.1* 34.1*  MCV 90.3 89.3  PLT 174 161   Basic Metabolic Panel: Recent Labs  Lab 01/24/18 1210 01/25/18 0757 01/26/18 0721 01/27/18 0606 01/28/18 0500  NA 140 139 138 138 137  K 2.2* 2.3* 3.1* 3.3* 3.5  CL 97* 100 100 99 97*  CO2 31 29 29 29 29   GLUCOSE 125* 106* 107* 107* 158*  BUN 9 13 13 12 18   CREATININE 0.96 0.86 0.80 0.68 0.78  CALCIUM 7.2* 6.8* 7.4* 7.6* 8.2*  MG  --   --  1.4* 1.4* 2.2  PHOS  --   --  2.2*  --  3.0   GFR: Estimated Creatinine Clearance: 70.7 mL/min (by C-G formula based on SCr of 0.78 mg/dL). Liver Function Tests: Recent Labs  Lab 01/24/18 1210  AST 27  ALT 13  ALKPHOS 138*  BILITOT 1.2  PROT 5.2*  ALBUMIN 2.3*   No results for input(s): LIPASE, AMYLASE in the last 168 hours. No results for  input(s): AMMONIA in the last 168 hours. Coagulation Profile: No results for input(s): INR, PROTIME in the last 168 hours. Cardiac Enzymes: No results for input(s): CKTOTAL, CKMB, CKMBINDEX, TROPONINI in the last 168 hours. BNP (last 3 results) No results for input(s): PROBNP in the last 8760 hours. HbA1C: No results for input(s): HGBA1C in the last 72 hours. CBG: No results for input(s): GLUCAP in the last 168 hours. Lipid Profile: No results for input(s): CHOL, HDL, LDLCALC, TRIG, CHOLHDL, LDLDIRECT in the last 72 hours. Thyroid Function Tests: No results for input(s): TSH, T4TOTAL, FREET4, T3FREE, THYROIDAB in the last 72 hours. Anemia Panel: No results for input(s): VITAMINB12, FOLATE, FERRITIN, TIBC, IRON, RETICCTPCT in the last 72 hours. Urine analysis:    Component Value Date/Time   COLORURINE AMBER (A) 01/25/2018 0140   APPEARANCEUR CLEAR 01/25/2018 0140   LABSPEC 1.025 01/25/2018 0140   PHURINE 5.0 01/25/2018 0140   GLUCOSEU NEGATIVE 01/25/2018 0140   HGBUR NEGATIVE 01/25/2018 0140   BILIRUBINUR SMALL (A) 01/25/2018 0140   KETONESUR 5 (A) 01/25/2018 0140   PROTEINUR 30 (A) 01/25/2018 0140   NITRITE NEGATIVE 01/25/2018 0140   LEUKOCYTESUR TRACE (A) 01/25/2018 0140   Sepsis Labs: @LABRCNTIP (procalcitonin:4,lacticidven:4)  ) Recent Results (from the past 240 hour(s))  Blood Culture (routine x 2)  Status: None (Preliminary result)   Collection Time: 01/24/18 12:10 PM  Result Value Ref Range Status   Specimen Description BLOOD BLOOD RIGHT FOREARM  Final   Special Requests   Final    BOTTLES DRAWN AEROBIC AND ANAEROBIC Blood Culture results may not be optimal due to an excessive volume of blood received in culture bottles   Culture   Final    NO GROWTH 4 DAYS Performed at [facility] [address] [place], [place] [address]    Report Status PENDING  Incomplete  Blood Culture (routine x 2)     Status: None (Preliminary result)   Collection Time:  01/24/18 12:15 PM  Result Value Ref Range Status   Specimen Description BLOOD RIGHT ANTECUBITAL  Final   Special Requests   Final    BOTTLES DRAWN AEROBIC AND ANAEROBIC Blood Culture adequate volume   Culture   Final    NO GROWTH 4 DAYS Performed at [facility], [place] [address], [place], [place] [address]    Report Status PENDING  Incomplete  MRSA PCR Screening     Status: None   Collection Time: 01/24/18  6:01 PM  Result Value Ref Range Status   MRSA by PCR NEGATIVE NEGATIVE Final    Comment:        The GeneXpert MRSA Assay (FDA approved for NASAL specimens only), is one component of a comprehensive MRSA colonization surveillance program. It is not intended to diagnose MRSA infection nor to guide or monitor treatment for MRSA infections. Performed at [facility], [place] [address] [place], [place] [address]       Studies: Dg Chest Port 1 View  Result Date: 01/28/2018 CLINICAL DATA:  Hypoxia. EXAM: PORTABLE CHEST 1 VIEW COMPARISON:  Radiographs 01/24/2018 FINDINGS: Bilateral pleural effusions have increased in the interim, left greater than right, with associated bibasilar airspace disease. Progression in bilateral perihilar opacities suspicious for pulmonary edema. Cardiomegaly which is partially obscured on the left. No pneumothorax. IMPRESSION: Increased bilateral pleural effusions and bibasilar airspace disease. Increasing bilateral perihilar opacities suspicious for pulmonary edema. Cardiomegaly is unchanged. Electronically Signed   By: Keith Rake M.D.   On: 01/28/2018 06:34    Scheduled Meds: . apixaban  5 mg Oral BID  . atorvastatin  10 mg Oral QPM  . azaTHIOprine  50 mg Oral BID  . brimonidine  1 drop Both Eyes BID  . brinzolamide  1 drop Both Eyes BID  . docusate sodium  100 mg Oral BID  . fluticasone  1 spray Each Nare Daily  . guaiFENesin  1,200 mg Oral BID  . ipratropium-albuterol  3 mL Nebulization BID  . latanoprost  1 drop Both Eyes QHS  .  levothyroxine  200 mcg Oral QAC breakfast  . mouth rinse  15 mL Mouth Rinse BID  . methylPREDNISolone (SOLU-MEDROL) injection  60 mg Intravenous Q8H  . metoprolol tartrate  25 mg Oral BID  . mometasone-formoterol  2 puff Inhalation BID  . morphine  15 mg Oral BID  . pantoprazole  40 mg Oral BID  . polycarbophil  625 mg Oral QHS  . sertraline  50 mg Oral Daily  . sodium chloride HYPERTONIC  4 mL Nebulization BID    Continuous Infusions: . sodium chloride 250 mL (01/25/18 0325)  . piperacillin-tazobactam (ZOSYN)  IV 3.375 g (01/28/18 1216)     LOS: 4 days     Kayleen Memos, MD [facility] Pager [phone]  If 7PM-7AM, please  contact night-coverage [email or web address] Password [email or web address] 01/28/2018, 5:31 PM

## 2018-01-28 NOTE — Progress Notes (Signed)
Pharmacy Antibiotic Note  Travis Ross is a 84 y.o. male admitted on 01/24/2018 with pneumonia. Pt has been on zosyn to cover for asp for 5d now. D/w Dr. Nevada Crane today and we will stop at 7 days.  Plan: Cont Zosyn 3.375g IV q8 x 2 more days Rx sign off  Height: 6\' 1"  (185.4 cm) Weight: 197 lb 15.6 oz (89.8 kg) IBW/kg (Calculated) : 79.9  Temp (24hrs), Avg:97.6 F (36.4 C), Min:97.4 F (36.3 C), Max:98 F (36.7 C)  Recent Labs  Lab 01/24/18 1210 01/24/18 1235 01/25/18 0757 01/26/18 0721 01/27/18 0606 01/28/18 0500  WBC 15.1*  --   --  9.2  --   --   CREATININE 0.96  --  0.86 0.80 0.68 0.78  LATICACIDVEN  --  1.79  --   --   --   --     Estimated Creatinine Clearance: 70.7 mL/min (by C-G formula based on SCr of 0.78 mg/dL).    No Known Allergies  Antimicrobials this admission: Cefepime 11/27>>11/27 azithro 11/27>>11/27 Zosyn 11/27>>12/3  Dose adjustments this admission: n/a  Microbiology results: 11/27 BCx: ngtd  Onnie Boer, PharmD, BCIDP, AAHIVP, CPP Infectious Disease Pharmacist 01/28/2018 2:32 PM

## 2018-01-29 ENCOUNTER — Encounter (HOSPITAL_COMMUNITY): Payer: Self-pay | Admitting: Student

## 2018-01-29 ENCOUNTER — Inpatient Hospital Stay (HOSPITAL_COMMUNITY): Payer: Medicare HMO

## 2018-01-29 DIAGNOSIS — R0600 Dyspnea, unspecified: Secondary | ICD-10-CM

## 2018-01-29 HISTORY — PX: IR THORACENTESIS ASP PLEURAL SPACE W/IMG GUIDE: IMG5380

## 2018-01-29 LAB — CBC WITH DIFFERENTIAL/PLATELET
Abs Immature Granulocytes: 0.06 10*3/uL (ref 0.00–0.07)
BASOS ABS: 0 10*3/uL (ref 0.0–0.1)
Basophils Relative: 0 %
Eosinophils Absolute: 0 10*3/uL (ref 0.0–0.5)
Eosinophils Relative: 0 %
HCT: 37.6 % — ABNORMAL LOW (ref 39.0–52.0)
Hemoglobin: 11 g/dL — ABNORMAL LOW (ref 13.0–17.0)
Immature Granulocytes: 1 %
LYMPHS ABS: 0.2 10*3/uL — AB (ref 0.7–4.0)
Lymphocytes Relative: 1 %
MCH: 25.8 pg — ABNORMAL LOW (ref 26.0–34.0)
MCHC: 29.3 g/dL — ABNORMAL LOW (ref 30.0–36.0)
MCV: 88.1 fL (ref 80.0–100.0)
Monocytes Absolute: 0.6 10*3/uL (ref 0.1–1.0)
Monocytes Relative: 5 %
Neutro Abs: 12.3 10*3/uL — ABNORMAL HIGH (ref 1.7–7.7)
Neutrophils Relative %: 93 %
Platelets: 214 10*3/uL (ref 150–400)
RBC: 4.27 MIL/uL (ref 4.22–5.81)
RDW: 19 % — ABNORMAL HIGH (ref 11.5–15.5)
WBC: 13.2 10*3/uL — ABNORMAL HIGH (ref 4.0–10.5)
nRBC: 0 % (ref 0.0–0.2)

## 2018-01-29 LAB — BASIC METABOLIC PANEL
Anion gap: 12 (ref 5–15)
BUN: 24 mg/dL — ABNORMAL HIGH (ref 8–23)
CO2: 29 mmol/L (ref 22–32)
Calcium: 8.9 mg/dL (ref 8.9–10.3)
Chloride: 96 mmol/L — ABNORMAL LOW (ref 98–111)
Creatinine, Ser: 0.88 mg/dL (ref 0.61–1.24)
GFR calc Af Amer: >60 mL/min (ref >60)
GFR calc non Af Amer: >60 mL/min (ref >60)
GLUCOSE: 170 mg/dL — AB (ref 70–99)
Potassium: 3.5 mmol/L (ref 3.5–5.1)
Sodium: 137 mmol/L (ref 135–145)

## 2018-01-29 LAB — CULTURE, BLOOD (ROUTINE X 2)
CULTURE: NO GROWTH
Culture: NO GROWTH
SPECIAL REQUESTS: ADEQUATE

## 2018-01-29 MED ORDER — LIDOCAINE HCL 1 % IJ SOLN
INTRAMUSCULAR | Status: DC | PRN
  Administered 2018-01-29: 10 mL

## 2018-01-29 MED ORDER — METHYLPREDNISOLONE SODIUM SUCC 125 MG IJ SOLR: 60.0000 mg | Freq: Every day | INTRAMUSCULAR | Status: DC

## 2018-01-29 MED ORDER — FUROSEMIDE 10 MG/ML IJ SOLN
20.0000 mg | Freq: Once | INTRAMUSCULAR | Status: AC
  Administered 2018-01-29: 20 mg via INTRAVENOUS
  Filled 2018-01-29: qty 2

## 2018-01-29 MED ORDER — LIDOCAINE HCL 1 % IJ SOLN
INTRAMUSCULAR | Status: AC
  Filled 2018-01-29: qty 20

## 2018-01-29 NOTE — Progress Notes (Signed)
PROGRESS NOTE  Travis Ross EZM:[id number] DOB: 1926/06/26 DOA: 01/24/2018 PCP: Virgel Bouquet, MD  HPI/Recap of past 24 hours: Travis Ross is a 84 y.o. male with h/o COPD, emphysema, hypertension, GERD, hyperlipidemia, hypothyroidism, chronic atrial fibrillation on anticoagulation, hearing loss and myasthenia gravis with chronic dysphagia who resides at skilled nursing facility and has had recurrent admissions for aspiration pneumonia (in December 2018, June 2019 and September 2019) brought in due to shortness of breath, hypoxia, persistent wet cough, and failure of outpatient treatment with Levaquin for pneumonia.  Patient is wheelchair-bound at baseline due to bilateral knee arthritis/old right femur fracture and chronic muscle weakness.  He however is mentally sound and able to communicate according to family members although currently appears somnolent and tired. Admitted for sepsis secondary to aspiration pneumonia.  01/25/2018: Productive cough.  Added pulmonary toilet with Mucinex 1200 mg twice daily, hypersaline nebs 3 times daily. 01/26/2018: Breathing is slightly improved.  01/27/2018: Tachycardic.  He denies any chest pain palpitations or dyspnea at rest.  01/28/2018: Patient seen and examined at his bedside.  Intermittent hypoxia noted.  Spoke to the patient's daughter to give her an update.  Explained to her that the patient has a really poor prognosis.  Repeated a chest x-ray which revealed worsening pulmonary edema and possible left pleural effusion.  Patient's daughter requested thoracentesis to be done even knowing that his prognosis is still very poor in the setting of recurrent aspiration.  01/29/18: Patient seen and examined at his bedside.  States he has trouble breathing.  Left thoracentesis planned today by interventional radiology.   Assessment/Plan: Active Problems:   Hypokalemia   Pneumonia   Hypoxia   Sepsis secondary to recurrent aspiration  pneumonia Presented with leukocytosis with WBC 15,000, A. fib RVR with rate of 125 Treated empirically with IV Zosyn day number 6 out of 7 Continue pulmonary toilet with Mucinex 1200 mg twice daily, hypersaline nebs 3 times daily Continue duo nebs every 6 hours Completed swallow evaluation by speech therapy who recommended dysphagia 2 diet  Worsening pulmonary edema with suspected left pleural effusion Independently reviewed chest x-ray done on 01/28/2018 which revealed worsening pulmonary edema with suspected left pleural effusion Explained prognosis to daughter which is poor in the setting of recurrent aspiration Daughter requested thoracentesis US thoracentesis and planned today 01/29/2018 Possible discharge with hospice after thoracentesis, if can it be done by interventional radiology Given another dose of 20 mg of IV Lasix  Acute hypoxic respiratory failure secondary to recurrent aspiration pneumonia, pulmonary edema, possible left pleural effusion Continue to supplement oxygen as needed Requiring oxygen at this time Maintain O2 saturation greater than 92% Breathing treatment as stated above Possible left thoracentesis today  Resolved hypokalemia post repletion  Resolved hypomagnesemia post repletion   Hypophosphatemia Repleted  Chronic A. fib with resolved RVR Suspect RVR was secondary to sepsis physiology Rate controlled Continue anticoagulation Eliquis Continue metoprolol  History of cervical spinal cord compression Now with palliative care  Myasthenia gravis Resume home medications  Hypothyroidism Resume levothyroxine  Hyperlipidemia Resume Lipitor  Hypertension Blood pressure is well controlled Resume home antihypertensive medications  Ambulatory dysfunction PT to assess Fall precautions     DVT prophylaxis: Eliquis  Code Status: DNR as confirmed with family  Family Communication: none at bedside Consults called: Palliative care, Speech  therapy  Disposition: Possible home tomorrow 01/30/2018   Objective: Vitals:   01/28/18 2146 01/29/18 0519 01/29/18 0858 01/29/18 0917  BP: 137/87 (!) 145/102  (!) 154/92  Pulse: 82 Marland Kitchen)  112  73  Resp: 14 16  18   Temp: 98.5 F (36.9 C) 98 F (36.7 C)  97.6 F (36.4 C)  TempSrc: Oral Oral  Oral  SpO2: 90% 92% 91% 99%  Weight:      Height:        Intake/Output Summary (Last 24 hours) at 01/29/2018 1042 Last data filed at 01/29/2018 1001 Gross per 24 hour  Intake 1139.94 ml  Output 975 ml  Net 164.94 ml   Filed Weights   01/24/18 2055 01/26/18 0948  Weight: 89.8 kg 89.8 kg    Exam:  . General: 84 y.o. year-old male well-developed well-nourished in no acute distress.  Alert and interactive.   . Cardiovascular: Irregular rate and rhythm with no rubs or gallops.  Right JVD noted.  No thyromegaly.   Marland Kitchen Respiratory: Mild rales bilaterally diffusely.  Poor inspiratory effort. . Abdomen: Soft nontender nondistended with normal bowel sounds x4 quadrants. . Musculoskeletal:Trace lower extremity edema. 2/4 pulses in all 4 extremities. Marland Kitchen Psychiatry: Mood is appropriate for condition and setting   Data Reviewed: CBC: Recent Labs  Lab 01/24/18 1210 01/26/18 0721 01/29/18 0633  WBC 15.1* 9.2 13.2*  NEUTROABS 14.2* 7.8* 12.3*  HGB 10.8* 9.9* 11.0*  HCT 36.1* 34.1* 37.6*  MCV 90.3 89.3 88.1  PLT 174 167 263   Basic Metabolic Panel: Recent Labs  Lab 01/25/18 0757 01/26/18 0721 01/27/18 0606 01/28/18 0500 01/29/18 0633  NA 139 138 138 137 137  K 2.3* 3.1* 3.3* 3.5 3.5  CL 100 100 99 97* 96*  CO2 29 29 29 29 29   GLUCOSE 106* 107* 107* 158* 170*  BUN 13 13 12 18  24*  CREATININE 0.86 0.80 0.68 0.78 0.88  CALCIUM 6.8* 7.4* 7.6* 8.2* 8.9  MG  --  1.4* 1.4* 2.2  --   PHOS  --  2.2*  --  3.0  --    GFR: Estimated Creatinine Clearance: 64.3 mL/min (by C-G formula based on SCr of 0.88 mg/dL). Liver Function Tests: Recent Labs  Lab 01/24/18 1210  AST 27  ALT 13   ALKPHOS 138*  BILITOT 1.2  PROT 5.2*  ALBUMIN 2.3*   No results for input(s): LIPASE, AMYLASE in the last 168 hours. No results for input(s): AMMONIA in the last 168 hours. Coagulation Profile: No results for input(s): INR, PROTIME in the last 168 hours. Cardiac Enzymes: No results for input(s): CKTOTAL, CKMB, CKMBINDEX, TROPONINI in the last 168 hours. BNP (last 3 results) No results for input(s): PROBNP in the last 8760 hours. HbA1C: No results for input(s): HGBA1C in the last 72 hours. CBG: No results for input(s): GLUCAP in the last 168 hours. Lipid Profile: No results for input(s): CHOL, HDL, LDLCALC, TRIG, CHOLHDL, LDLDIRECT in the last 72 hours. Thyroid Function Tests: No results for input(s): TSH, T4TOTAL, FREET4, T3FREE, THYROIDAB in the last 72 hours. Anemia Panel: No results for input(s): VITAMINB12, FOLATE, FERRITIN, TIBC, IRON, RETICCTPCT in the last 72 hours. Urine analysis:    Component Value Date/Time   COLORURINE AMBER (A) 01/25/2018 0140   APPEARANCEUR CLEAR 01/25/2018 0140   LABSPEC 1.025 01/25/2018 0140   PHURINE 5.0 01/25/2018 0140   GLUCOSEU NEGATIVE 01/25/2018 0140   HGBUR NEGATIVE 01/25/2018 0140   BILIRUBINUR SMALL (A) 01/25/2018 0140   KETONESUR 5 (A) 01/25/2018 0140   PROTEINUR 30 (A) 01/25/2018 0140   NITRITE NEGATIVE 01/25/2018 0140   LEUKOCYTESUR TRACE (A) 01/25/2018 0140   Sepsis Labs: @LABRCNTIP (procalcitonin:4,lacticidven:4)  ) Recent Results (from the past 240  hour(s))  Blood Culture (routine x 2)     Status: None   Collection Time: 01/24/18 12:10 PM  Result Value Ref Range Status   Specimen Description BLOOD BLOOD RIGHT FOREARM  Final   Special Requests   Final    BOTTLES DRAWN AEROBIC AND ANAEROBIC Blood Culture results may not be optimal due to an excessive volume of blood received in culture bottles   Culture   Final    NO GROWTH 5 DAYS Performed at [facility], [place] [address], [place], [place] [address]    Report  Status 01/29/2018 FINAL  Final  Blood Culture (routine x 2)     Status: None   Collection Time: 01/24/18 12:15 PM  Result Value Ref Range Status   Specimen Description BLOOD RIGHT ANTECUBITAL  Final   Special Requests   Final    BOTTLES DRAWN AEROBIC AND ANAEROBIC Blood Culture adequate volume   Culture   Final    NO GROWTH 5 DAYS Performed at [facility], [place] [address], [place], [place] [address]    Report Status 01/29/2018 FINAL  Final  MRSA PCR Screening     Status: None   Collection Time: 01/24/18  6:01 PM  Result Value Ref Range Status   MRSA by PCR NEGATIVE NEGATIVE Final    Comment:        The GeneXpert MRSA Assay (FDA approved for NASAL specimens only), is one component of a comprehensive MRSA colonization surveillance program. It is not intended to diagnose MRSA infection nor to guide or monitor treatment for MRSA infections. Performed at [facility], [place] [address] [place], [place] [address]       Studies: Dg Chest 1 View  Result Date: 01/29/2018 CLINICAL DATA:  Status post left thoracentesis. EXAM: CHEST  1 VIEW COMPARISON:  01/28/2018 FINDINGS: The cardiac silhouette remains enlarged. Aortic atherosclerosis is noted. There is a small residual left pleural effusion, much smaller following interval thoracentesis. A small right pleural effusion is unchanged. There is improved aeration of the left lung, however dense opacity remains in the retrocardiac left lower lobe. There is mild residual left perihilar opacity. Patchy right perihilar and right basilar opacities are unchanged. No pneumothorax is identified. IMPRESSION: 1. Decreased size of left pleural effusion following thoracentesis with improved left lung aeration. No pneumothorax identified. 2. Persistent patchy perihilar and basilar airspace opacities which may reflect edema or pneumonia. 3. Persistent dense left basilar consolidation or collapse. Electronically Signed   By: Logan Bores M.D.   On:  01/29/2018 10:25    Scheduled Meds: . apixaban  5 mg Oral BID  . atorvastatin  10 mg Oral QPM  . azaTHIOprine  50 mg Oral BID  . brimonidine  1 drop Both Eyes BID  . brinzolamide  1 drop Both Eyes BID  . docusate sodium  100 mg Oral BID  . fluticasone  1 spray Each Nare Daily  . guaiFENesin  1,200 mg Oral BID  . ipratropium-albuterol  3 mL Nebulization BID  . latanoprost  1 drop Both Eyes QHS  . levothyroxine  200 mcg Oral QAC breakfast  . lidocaine      . mouth rinse  15 mL Mouth Rinse BID  . [START ON 01/30/2018] methylPREDNISolone (SOLU-MEDROL) injection  60 mg Intravenous Daily  . metoprolol tartrate  25 mg Oral BID  . mometasone-formoterol  2 puff Inhalation BID  . morphine  15 mg Oral BID  . pantoprazole  40 mg Oral BID  .  polycarbophil  625 mg Oral QHS  . sertraline  50 mg Oral Daily  . sodium chloride HYPERTONIC  4 mL Nebulization BID    Continuous Infusions: . sodium chloride 250 mL (01/25/18 0325)  . piperacillin-tazobactam (ZOSYN)  IV 3.375 g (01/29/18 0400)     LOS: 5 days     Kayleen Memos, MD [facility] Pager [phone]  If 7PM-7AM, please contact night-coverage [email or web address] Password [facility] 01/29/2018, 10:42 AM

## 2018-01-29 NOTE — Progress Notes (Signed)
CSW spoke with POA daughter Truett Perna who reports she was trying to reach palliative care not Education officer, museum. She reports she did want to ensure discharge plan with [facility] and palliative was appropriate. CSW reported to [facility] was notified of patient coming back with palliative care and they are ready for his return. They also allow hospice to follow patient as well, however [name] reports she will be transferring patient from [place] to a hospice facility near her when that time comes.   CSW will continue to notify family of possible discharge tomorrow back to [place].   [place], [place]

## 2018-01-29 NOTE — Procedures (Signed)
PROCEDURE SUMMARY:  Successful image-guided left thoracentesis. Yielded 900 milliliters of hazy amber fluid. Patient tolerated procedure well. No immediate complications.  Specimen was not sent for labs. CXR ordered.  Claris Pong Louk PA-C 01/29/2018 11:03 AM

## 2018-01-30 ENCOUNTER — Inpatient Hospital Stay (HOSPITAL_COMMUNITY): Payer: Medicare HMO

## 2018-01-30 DIAGNOSIS — Z7189 Other specified counseling: Secondary | ICD-10-CM

## 2018-01-30 DIAGNOSIS — R0603 Acute respiratory distress: Secondary | ICD-10-CM

## 2018-01-30 DIAGNOSIS — Z515 Encounter for palliative care: Secondary | ICD-10-CM

## 2018-01-30 MED ORDER — FUROSEMIDE 10 MG/ML IJ SOLN
40.0000 mg | Freq: Every day | INTRAMUSCULAR | Status: DC
  Administered 2018-01-30: 40 mg via INTRAVENOUS
  Filled 2018-01-30: qty 4

## 2018-01-30 MED ORDER — METOPROLOL TARTRATE 5 MG/5ML IV SOLN
2.5000 mg | Freq: Once | INTRAVENOUS | Status: AC
  Administered 2018-01-30: 2.5 mg via INTRAVENOUS
  Filled 2018-01-30: qty 5

## 2018-01-30 MED ORDER — METHYLPREDNISOLONE 4 MG PO TBPK: 4.0000 mg | ORAL_TABLET | Freq: Four times a day (QID) | ORAL | Status: DC

## 2018-01-30 MED ORDER — METOPROLOL TARTRATE 5 MG/5ML IV SOLN: 2.5000 mg | INTRAVENOUS | Status: DC

## 2018-01-30 NOTE — Progress Notes (Signed)
[facility] Met with family and pt discussed hospice philosophy and comfort care. They are in agreement to the philosophy. Spoke with Dr. Georga Bora our Medical provider and he was approved for sx management end of life care at [facility] in [place]. [facility] notified. She will let MD know and arrange transport. Webb Silversmith RN [phone]

## 2018-01-30 NOTE — Progress Notes (Signed)
CSW was contacted by Dr. Nevada Crane to ensure patient's daughter has a conversation with palliative regarding patient's prognosis and recommendation of hospice care.   CSW left vm for palliative care team with patient information and requesting they reach back out to daughter per Dr. Juel Burrow request.   Daughter had reached out to [facility] and asked for assistance in patient going to hospice facility in [facility] and to speak with social worker Fara Olden at facility at [phone].   CSW will continue to follow up with patient placement and discharge plans.   [place], [place]

## 2018-01-30 NOTE — Discharge Summary (Addendum)
Discharge Summary  Travis Ross YQI:[id number] DOB: 13-Feb-1927  PCP: Virgel Bouquet, MD  Admit date: 01/24/2018 Discharge date: 01/30/2018  Time spent: 35 minutes  Recommendations for Outpatient Follow-up:  1. Follow-up with hospice care services 2. Patient has transitioned to [facility] and will be transferred to [facility] on 01/30/18. He appears to be safe for transport.  Discharge Diagnoses:  Active Hospital Problems   Diagnosis Date Noted  . Dyspnea   . Hypoxia   . Pneumonia 11/01/2017  . Hypokalemia 08/15/2017    Resolved Hospital Problems  No resolved problems to display.    Discharge Condition: Stable  Diet recommendation: Pleasure feeding allowed in an hospice patient in the setting of recurrent aspiration/silent aspiration.  He is currently on dysphagia 2 diet as recommended by speech therapy which consists of puree meat, extra gravy/sauces and thin liquid.  Vitals:   01/30/18 0923 01/30/18 1200  BP:  (!) 158/92  Pulse:    Resp:    Temp:    SpO2: 92%     History of present illness:   Travis Ross a 84 y.o.malewith h/oCOPD, emphysema, hypertension, GERD, hyperlipidemia, hypothyroidism, chronic atrial fibrillation on Eliquis, hearing loss and myasthenia gravis with chronic dysphagia from skilled nursing facility.  Has had recurrent admissions for aspiration pneumonia (in December 2018, June 2019 and September 2019) brought in due to shortness of breath, hypoxia, persistent wet cough, and failure of outpatient treatment with Levaquin for pneumonia. Patient is wheelchair-bound at baseline due to bilateral knee arthritis/old right femur fracture and chronic muscle weakness.He however is mentally sound and able to communicate.    Admitted for sepsis secondary to aspiration pneumonia.  During his hospitalization completed 7 days of IV Zosyn and round-the-clock breathing treatments along with pulmonary toilet.  Hospital course complicated by  recurrent pleural effusions and worsening pulmonary edema despite thoracentesis and aggressive treatments.  Palliative care team has been consulted and following.  Decision was made by family and patient to be transitioned to palliative care services at [facility].    Patient is thought to have recurrent aspiration/silent aspiration with poor functional status in the setting of multiple comorbidities and advanced age. Prognosis is poor. Patient may benefit from hospice care.  01/30/18: Seen and examined at his bedside.  States he feels better post thoracentesis however still feels short of breath at rest.  Status post thoracentesis on 01/29/2018 with nearly 1 L of fluid removed from left pleural space.  Repeated chest x-ray independently reviewed done this morning 01/30/2018 revealed bilateral pleural effusions and worsening pulmonary edema bilaterally worse on the right. Patient O2 requirement continues to increase.  Intermittently going into A. fib RVR.  On the day of discharge, the patient was safe for transport.  He will need to follow with hospice care services posthospitalization.  UPDATE: PATIENT AND FAMILY HAVE MADE DECISION FOR COMFORT MEASURES ONLY. ALL MEANS OF PROLONGING LIFE HAVE BEEN DISCONTINUED.  Hospital Course:  Active Problems:   Hypokalemia   Pneumonia   Hypoxia   Dyspnea  -Sepsis secondary to recurrent aspiration pneumonia with high suspicion for silent aspiration -Worsening pulmonary edema with suspected left pleural effusion post left thoracentesis with 900 cc of fluid removed -Acute hypoxic respiratory failure secondary to recurrent aspiration pneumonia, suspected silent aspiration, worsening pulmonary edema, bilateral pleural effusion  -Resolved hypokalemia post repletion -Resolved hypomagnesemia post repletion  -Hypophosphatemia -Chronic A. fib with RVR -History of cervical spinal cord compression -Myasthenia  gravis -Hypothyroidism -Hyperlipidemia -Hypertension -Ambulatory dysfunction  Comfort Care orders only as  recommended by Hospice care services All treatments to prolong life have been discontinued.     Code Status:DNR/ now comfort care only.  Disposition: Transfer to [facility] on 01/30/18.     Discharge Exam: BP (!) 158/92 (BP Location: Left Arm)   Pulse 87   Temp (!) 97.5 F (36.4 C) (Oral)   Resp 16   Ht 6\' 1"  (1.854 m)   Wt 89.8 kg   SpO2 92%   BMI 26.12 kg/m  . General: 84 y.o. year-old male FRAIL, pleasant, chronically ill-appearing, alert and oriented x3. . Cardiovascular: Irregular rate and rhythm with no rubs or gallops.  No thyromegaly or JVD noted.   Marland Kitchen Respiratory: Diffused rhonchorous sounds with no wheezes. Poor inspiratory effort. . Abdomen: Soft nontender nondistended with normal bowel sounds x4 quadrants. . Musculoskeletal: Trace lower extremity edema. 2/4 pulses in all 4 extremities. Marland Kitchen Psychiatry: Mood is appropriate for condition and setting  Discharge Instructions You were cared for by a hospitalist during your hospital stay. If you have any questions about your discharge medications or the care you received while you were in the hospital after you are discharged, you can call the unit and asked to speak with the hospitalist on call if the hospitalist that took care of you is not available. Once you are discharged, your primary care physician will handle any further medical issues. Please note that NO REFILLS for any discharge medications will be authorized once you are discharged, as it is imperative that you return to your primary care physician (or establish a relationship with a primary care physician if you do not have one) for your aftercare needs so that they can reassess your need for medications and monitor your lab values.   Allergies as of 01/30/2018   No Known Allergies     Medication List    STOP taking these medications    amoxicillin-clavulanate 875-125 MG tablet Commonly known as:  AUGMENTIN   apixaban 5 MG Tabs tablet Commonly known as:  ELIQUIS   atorvastatin 10 MG tablet Commonly known as:  LIPITOR   azaTHIOprine 50 MG tablet Commonly known as:  IMURAN   benzonatate 100 MG capsule Commonly known as:  TESSALON   chlorhexidine 0.12 % solution Commonly known as:  PERIDEX   COMBIVENT RESPIMAT 20-100 MCG/ACT Aers respimat Generic drug:  Ipratropium-Albuterol   DULERA 100-5 MCG/ACT Aero Generic drug:  mometasone-formoterol   feeding supplement (PRO-STAT SUGAR FREE 64) Liqd   fluticasone 50 MCG/ACT nasal spray Commonly known as:  FLONASE   furosemide 40 MG tablet Commonly known as:  LASIX   GUAIFENESIN ER PO   ipratropium-albuterol 0.5-2.5 (3) MG/3ML Soln Commonly known as:  DUONEB   levothyroxine 200 MCG tablet Commonly known as:  SYNTHROID, LEVOTHROID   losartan 25 MG tablet Commonly known as:  COZAAR   magnesium hydroxide 400 MG/5ML suspension Commonly known as:  MILK OF MAGNESIA   metoprolol tartrate 25 MG tablet Commonly known as:  LOPRESSOR   ondansetron 4 MG tablet Commonly known as:  ZOFRAN   pantoprazole 40 MG tablet Commonly known as:  PROTONIX   polycarbophil 625 MG tablet Commonly known as:  FIBERCON   pregabalin 25 MG capsule Commonly known as:  LYRICA   SANTYL ointment Generic drug:  collagenase   senna 8.6 MG tablet Commonly known as:  SENOKOT   sertraline 50 MG tablet Commonly known as:  ZOLOFT   SIMBRINZA 1-0.2 % Susp Generic drug:  Brinzolamide-Brimonidine   temazepam 30 MG capsule  Commonly known as:  RESTORIL   TRAVATAN Z 0.004 % Soln ophthalmic solution Generic drug:  Travoprost (BAK Free)     TAKE these medications   morphine 15 MG 12 hr tablet Commonly known as:  MS CONTIN Take 15 mg by mouth 2 (two) times daily. For pain   oxyCODONE 5 MG immediate release tablet Commonly known as:  Oxy IR/ROXICODONE Take 5 mg by mouth 3  (three) times daily. Hold if sleepy or confused      No Known Allergies    The results of significant diagnostics from this hospitalization (including imaging, microbiology, ancillary and laboratory) are listed below for reference.    Significant Diagnostic Studies: Dg Chest 1 View  Result Date: 01/29/2018 CLINICAL DATA:  Status post left thoracentesis. EXAM: CHEST  1 VIEW COMPARISON:  01/28/2018 FINDINGS: The cardiac silhouette remains enlarged. Aortic atherosclerosis is noted. There is a small residual left pleural effusion, much smaller following interval thoracentesis. A small right pleural effusion is unchanged. There is improved aeration of the left lung, however dense opacity remains in the retrocardiac left lower lobe. There is mild residual left perihilar opacity. Patchy right perihilar and right basilar opacities are unchanged. No pneumothorax is identified. IMPRESSION: 1. Decreased size of left pleural effusion following thoracentesis with improved left lung aeration. No pneumothorax identified. 2. Persistent patchy perihilar and basilar airspace opacities which may reflect edema or pneumonia. 3. Persistent dense left basilar consolidation or collapse. Electronically Signed   By: Logan Bores M.D.   On: 01/29/2018 10:25   Dg Chest Port 1 View  Result Date: 01/30/2018 CLINICAL DATA:  Shortness of breath.  Cough. EXAM: PORTABLE CHEST 1 VIEW COMPARISON:  04/11/2017. FINDINGS: Cardiomegaly with diffuse bilateral pulmonary infiltrates consistent with pulmonary edema. Bilateral pleural effusions. No pneumothorax. No acute bony abnormality. IMPRESSION: Cardiomegaly with diffuse bilateral pulmonary infiltrates consistent with pulmonary edema. Bilateral pleural effusions. Findings have progressed from prior exam. Electronically Signed   By: [place]   On: 01/30/2018 09:12   Dg Chest Port 1 View  Result Date: 01/28/2018 CLINICAL DATA:  Hypoxia. EXAM: PORTABLE CHEST 1 VIEW COMPARISON:   Radiographs 01/24/2018 FINDINGS: Bilateral pleural effusions have increased in the interim, left greater than right, with associated bibasilar airspace disease. Progression in bilateral perihilar opacities suspicious for pulmonary edema. Cardiomegaly which is partially obscured on the left. No pneumothorax. IMPRESSION: Increased bilateral pleural effusions and bibasilar airspace disease. Increasing bilateral perihilar opacities suspicious for pulmonary edema. Cardiomegaly is unchanged. Electronically Signed   By: Keith Rake M.D.   On: 01/28/2018 06:34   Dg Chest Portable 1 View  Result Date: 01/24/2018 CLINICAL DATA:  Cough and fever her EXAM: PORTABLE CHEST 1 VIEW COMPARISON:  January 16, 2018 FINDINGS: There is persistent airspace consolidation throughout the left lower lobe with small left pleural effusion. There is increase in patchy opacity in the right lower lobe, felt to represent progression of pneumonia in this area. There is a small right pleural effusion. There is cardiomegaly with pulmonary vascularity normal. No adenopathy. There is aortic atherosclerosis. No bone lesions. IMPRESSION: Bilateral lower lobe consolidation felt to represent pneumonia, more severe on the left than on the right. New versus progression of apparent pneumonia right lower lobe compared to prior study. Stable cardiomegaly. No adenopathy evident. There is aortic atherosclerosis. Aortic Atherosclerosis (ICD10-I70.0). Electronically Signed   By: Lowella Grip III M.D.   On: 01/24/2018 12:38   Ir Thoracentesis Asp Pleural Space W/img Guide  Result Date: 01/29/2018 INDICATION: Patient with history  of dyspnea and bilateral pleural effusions, left greater than right. Request is made for therapeutic left thoracentesis. EXAM: ULTRASOUND GUIDED THERAPEUTIC LEFT THORACENTESIS MEDICATIONS: 10 mL of 1% lidocaine COMPLICATIONS: None immediate. PROCEDURE: An ultrasound guided thoracentesis was thoroughly discussed with the  patient and questions answered. The benefits, risks, alternatives and complications were also discussed. The patient understands and wishes to proceed with the procedure. Written consent was obtained. Ultrasound was performed to localize and mark an adequate pocket of fluid in the left chest. The area was then prepped and draped in the normal sterile fashion. 1% Lidocaine was used for local anesthesia. Under ultrasound guidance a 6 Fr Safe-T-Centesis catheter was introduced. Thoracentesis was performed. The catheter was removed and a dressing applied. FINDINGS: A total of approximately 900 mL of hazy amber fluid was removed. IMPRESSION: Successful ultrasound guided left thoracentesis yielding 900 mL of pleural fluid. Read by: Earley Abide, PA-C Electronically Signed   By: Corrie Mckusick D.O.   On: 01/29/2018 11:05    Microbiology: Recent Results (from the past 240 hour(s))  Blood Culture (routine x 2)     Status: None   Collection Time: 01/24/18 12:10 PM  Result Value Ref Range Status   Specimen Description BLOOD BLOOD RIGHT FOREARM  Final   Special Requests   Final    BOTTLES DRAWN AEROBIC AND ANAEROBIC Blood Culture results may not be optimal due to an excessive volume of blood received in culture bottles   Culture   Final    NO GROWTH 5 DAYS Performed at [facility], [place] [address], [place], [place] [address]    Report Status 01/29/2018 FINAL  Final  Blood Culture (routine x 2)     Status: None   Collection Time: 01/24/18 12:15 PM  Result Value Ref Range Status   Specimen Description BLOOD RIGHT ANTECUBITAL  Final   Special Requests   Final    BOTTLES DRAWN AEROBIC AND ANAEROBIC Blood Culture adequate volume   Culture   Final    NO GROWTH 5 DAYS Performed at [facility], [place] [address], [place], [place] [address]    Report Status 01/29/2018 FINAL  Final  MRSA PCR Screening     Status: None   Collection Time: 01/24/18  6:01 PM  Result Value Ref Range Status   MRSA by  PCR NEGATIVE NEGATIVE Final    Comment:        The GeneXpert MRSA Assay (FDA approved for NASAL specimens only), is one component of a comprehensive MRSA colonization surveillance program. It is not intended to diagnose MRSA infection nor to guide or monitor treatment for MRSA infections. Performed at [facility], [place] [address] [place], [place] [address]      Labs: Basic Metabolic Panel: Recent Labs  Lab 01/25/18 0757 01/26/18 0721 01/27/18 0606 01/28/18 0500 01/29/18 0633  NA 139 138 138 137 137  K 2.3* 3.1* 3.3* 3.5 3.5  CL 100 100 99 97* 96*  CO2 29 29 29 29 29   GLUCOSE 106* 107* 107* 158* 170*  BUN 13 13 12 18  24*  CREATININE 0.86 0.80 0.68 0.78 0.88  CALCIUM 6.8* 7.4* 7.6* 8.2* 8.9  MG  --  1.4* 1.4* 2.2  --   PHOS  --  2.2*  --  3.0  --    Liver Function Tests: Recent Labs  Lab 01/24/18 1210  AST 27  ALT 13  ALKPHOS 138*  BILITOT 1.2  PROT 5.2*  ALBUMIN 2.3*  No results for input(s): LIPASE, AMYLASE in the last 168 hours. No results for input(s): AMMONIA in the last 168 hours. CBC: Recent Labs  Lab 01/24/18 1210 01/26/18 0721 01/29/18 0633  WBC 15.1* 9.2 13.2*  NEUTROABS 14.2* 7.8* 12.3*  HGB 10.8* 9.9* 11.0*  HCT 36.1* 34.1* 37.6*  MCV 90.3 89.3 88.1  PLT 174 167 214   Cardiac Enzymes: No results for input(s): CKTOTAL, CKMB, CKMBINDEX, TROPONINI in the last 168 hours. BNP: BNP (last 3 results) Recent Labs    08/15/17 1229 08/22/17 1234 11/01/17 1125  BNP 338.7* 436.6* 335.0*    ProBNP (last 3 results) No results for input(s): PROBNP in the last 8760 hours.  CBG: No results for input(s): GLUCAP in the last 168 hours.     Signed:  Kayleen Memos, MD [facility] 01/30/2018, 2:18 PM

## 2018-01-30 NOTE — Clinical Social Work Placement (Signed)
   CLINICAL SOCIAL WORK PLACEMENT  NOTE  Date:  01/30/2018  Patient Details  Name: Travis Ross MRN: [id number] Date of Birth: 08/30/1926  Clinical Social Work is seeking post-discharge placement for this patient at the [facility]) level of care (*CSW will initial, date and re-position this form in  chart as items are completed):      Patient/family provided with [facility] Work Department's list of facilities offering this level of care within the geographic area requested by the patient (or if unable, by the patient's family).      Patient/family informed of their freedom to choose among providers that offer the needed level of care, that participate in Medicare, Medicaid or managed care program needed by the patient, have an available bed and are willing to accept the patient.      Patient/family informed of [facility]'s ownership interest in [facility] and [facility], as well as of the fact that they are under no obligation to receive care at these facilities.  PASRR submitted to EDS on       PASRR number received on 01/27/18     Existing PASRR number confirmed on       FL2 transmitted to all facilities in geographic area requested by pt/family on 01/27/18     FL2 transmitted to all facilities within larger geographic area on       Patient informed that his/her managed care company has contracts with or will negotiate with certain facilities, including the following:        Yes   Patient/family informed of bed offers received.  Patient chooses bed at Other - please specify in the comment section below:([facility])     Physician recommends and patient chooses bed at      Patient to be transferred to Other - please specify in the comment section below:( Hospice [facility]) on 01/30/18.  Patient to be transferred to facility by [facility]     Patient family notified on 01/30/18 of transfer.  Name of family member notified:  [name]      PHYSICIAN Please sign DNR     Additional Comment:    _______________________________________________ Alberteen Sam, LCSW 01/30/2018, 2:10 PM

## 2018-01-30 NOTE — Progress Notes (Signed)
Patient discharged to SNF. After visit Summary reviewed. Patient capable of reverbalizing medications and follow up visits. No signs and symptoms of distress noted. Patient educated to return to the ED in the case of an emergency. Barbee Mamula RN 

## 2018-01-30 NOTE — Progress Notes (Signed)
 PROGRESS NOTE  Travis Ross HDQ:[id number] DOB: 1926/06/25 DOA: 01/24/2018 PCP: Virgel Bouquet, MD  HPI/Recap of past 24 hours: Travis Ross is a 84 y.o. male with h/o COPD, emphysema, hypertension, GERD, hyperlipidemia, hypothyroidism, chronic atrial fibrillation on Eliquis, hearing loss and myasthenia gravis with chronic dysphagia from skilled nursing facility.  Has had recurrent admissions for aspiration pneumonia (in December 2018, June 2019 and September 2019) brought in due to shortness of breath, hypoxia, persistent wet cough, and failure of outpatient treatment with Levaquin for pneumonia.  Patient is wheelchair-bound at baseline due to bilateral knee arthritis/old right femur fracture and chronic muscle weakness.  He however is mentally sound and able to communicate.    Admitted for sepsis secondary to aspiration pneumonia.  During his hospitalization completed 7 days of IV Zosyn and round-the-clock breathing treatments along with pulmonary toilet.  Hospital course complicated by recurrent pleural effusions and worsening pulmonary edema despite thoracentesis and aggressive treatments.  Palliative care team has been consulted and following.  Decision was made by family and patient to be transitioned to palliative care services at [facility].    Patient is thought to have recurrent aspiration/silent aspiration with poor functional status in the setting of multiple comorbidities and advanced age. Prognosis is poor. Patient may benefit from hospice care.  01/30/18: Seen and examined at his bedside.  States he feels better post thoracentesis however still feels short of breath at rest.  Status post thoracentesis on 01/29/2018 with nearly 1 L of fluid removed from left pleural space.  Repeated chest x-ray independently reviewed done this morning 01/30/2018 revealed bilateral pleural effusions and worsening pulmonary edema bilaterally worse on the right.  Patient O2 requirement continues to increase.   Intermittently going into A. fib RVR.    Assessment/Plan: Active Problems:   Hypokalemia   Pneumonia   Hypoxia   Dyspnea   Sepsis secondary to recurrent aspiration pneumonia with high suspicion for silent aspiration Presented with leukocytosis with WBC 15,000, A. fib RVR with rate of 125 Treated empirically with IV Zosyn day number 7 out of 7 Continue pulmonary toilet with Mucinex 1200 mg twice daily, hypersaline nebs 3 times daily Continue duo nebs every 6 hours Completed swallow evaluation by speech therapy who recommended dysphagia 2 diet  Worsening pulmonary edema with suspected left pleural effusion post left thoracentesis with 900 cc of fluid removed Independently reviewed chest x-ray done on 01/30/2018 which revealed bilateral pleural effusion and worsening right pulmonary edema.   Blood pressure is no longer soft Added Lasix 40 mg daily Due to A. fib RVR will also add Lopressor 2.5 every 3 hours Patient has been intermittently refusing his pills due to difficulty swallowing  Acute hypoxic respiratory failure secondary to recurrent aspiration pneumonia, suspected silent aspiration, worsening pulmonary edema, bilateral pleural effusion  Continue to supplement oxygen as needed Requiring oxygen at this time Maintain O2 saturation greater than 92%  Resolved hypokalemia post repletion  Resolved hypomagnesemia post repletion   Hypophosphatemia Repleted  Chronic A. fib with RVR Start Lopressor 2.5 mg every 3 hours Patient has been intermittently refusing his pills due to difficulty with swallowing Continue anticoagulation Eliquis  History of cervical spinal cord compression Poor prognosis  Myasthenia gravis Resume home medications  Hypothyroidism Resume levothyroxine  Hyperlipidemia Resume Lipitor  Hypertension Blood pressure is well controlled Resume home antihypertensive medications  Ambulatory dysfunction PT to assess Fall precautions     DVT  prophylaxis: Eliquis  Code Status: DNR as confirmed with family  Family Communication:  Updated  her daughter on 01/30/2018 on the phone. Consults called: Palliative care, Speech therapy  Disposition: Poor prognosis recommend hospice care after discharge   Objective: Vitals:   01/29/18 2129 01/30/18 0530 01/30/18 0839 01/30/18 0923  BP: (!) 135/105 (!) 155/105 (!) 141/92   Pulse: (!) 104 91 87   Resp: 18 (!) 8 16   Temp: 98 F (36.7 C) 98 F (36.7 C) (!) 97.5 F (36.4 C)   TempSrc: Oral Oral Oral   SpO2: 93% 91% 91% 92%  Weight:      Height:        Intake/Output Summary (Last 24 hours) at 01/30/2018 1225 Last data filed at 01/30/2018 0900 Gross per 24 hour  Intake 579.59 ml  Output 0 ml  Net 579.59 ml   Filed Weights   01/24/18 2055 01/26/18 0948  Weight: 89.8 kg 89.8 kg    Exam:  . General: 84 y.o. year-old male well-developed well-nourished appears uncomfortable due to dyspnea at rest.  Alert and oriented x3. . Cardiovascular: Irregular rate and rhythm with no rubs or gallops.  Right JVD noted.  Marland Kitchen Respiratory: Diffuse rales bilaterally with poor inspiratory effort. . Abdomen: Soft nontender nondistended with normal bowel sounds x4 quadrants. . Musculoskeletal:Trace lower extremity edema. 2/4 pulses in all 4 extremities. Marland Kitchen Psychiatry: Mood is appropriate for condition and setting   Data Reviewed: CBC: Recent Labs  Lab 01/24/18 1210 01/26/18 0721 01/29/18 0633  WBC 15.1* 9.2 13.2*  NEUTROABS 14.2* 7.8* 12.3*  HGB 10.8* 9.9* 11.0*  HCT 36.1* 34.1* 37.6*  MCV 90.3 89.3 88.1  PLT 174 167 371   Basic Metabolic Panel: Recent Labs  Lab 01/25/18 0757 01/26/18 0721 01/27/18 0606 01/28/18 0500 01/29/18 0633  NA 139 138 138 137 137  K 2.3* 3.1* 3.3* 3.5 3.5  CL 100 100 99 97* 96*  CO2 29 29 29 29 29   GLUCOSE 106* 107* 107* 158* 170*  BUN 13 13 12 18  24*  CREATININE 0.86 0.80 0.68 0.78 0.88  CALCIUM 6.8* 7.4* 7.6* 8.2* 8.9  MG  --  1.4* 1.4* 2.2  --     PHOS  --  2.2*  --  3.0  --    GFR: Estimated Creatinine Clearance: 64.3 mL/min (by C-G formula based on SCr of 0.88 mg/dL). Liver Function Tests: Recent Labs  Lab 01/24/18 1210  AST 27  ALT 13  ALKPHOS 138*  BILITOT 1.2  PROT 5.2*  ALBUMIN 2.3*   No results for input(s): LIPASE, AMYLASE in the last 168 hours. No results for input(s): AMMONIA in the last 168 hours. Coagulation Profile: No results for input(s): INR, PROTIME in the last 168 hours. Cardiac Enzymes: No results for input(s): CKTOTAL, CKMB, CKMBINDEX, TROPONINI in the last 168 hours. BNP (last 3 results) No results for input(s): PROBNP in the last 8760 hours. HbA1C: No results for input(s): HGBA1C in the last 72 hours. CBG: No results for input(s): GLUCAP in the last 168 hours. Lipid Profile: No results for input(s): CHOL, HDL, LDLCALC, TRIG, CHOLHDL, LDLDIRECT in the last 72 hours. Thyroid Function Tests: No results for input(s): TSH, T4TOTAL, FREET4, T3FREE, THYROIDAB in the last 72 hours. Anemia Panel: No results for input(s): VITAMINB12, FOLATE, FERRITIN, TIBC, IRON, RETICCTPCT in the last 72 hours. Urine analysis:    Component Value Date/Time   COLORURINE AMBER (A) 01/25/2018 0140   APPEARANCEUR CLEAR 01/25/2018 0140   LABSPEC 1.025 01/25/2018 0140   PHURINE 5.0 01/25/2018 0140   GLUCOSEU NEGATIVE 01/25/2018 0140   HGBUR NEGATIVE  01/25/2018 0140   BILIRUBINUR SMALL (A) 01/25/2018 0140   KETONESUR 5 (A) 01/25/2018 0140   PROTEINUR 30 (A) 01/25/2018 0140   NITRITE NEGATIVE 01/25/2018 0140   LEUKOCYTESUR TRACE (A) 01/25/2018 0140   Sepsis Labs: @LABRCNTIP (procalcitonin:4,lacticidven:4)  ) Recent Results (from the past 240 hour(s))  Blood Culture (routine x 2)     Status: None   Collection Time: 01/24/18 12:10 PM  Result Value Ref Range Status   Specimen Description BLOOD BLOOD RIGHT FOREARM  Final   Special Requests   Final    BOTTLES DRAWN AEROBIC AND ANAEROBIC Blood Culture results may not be  optimal due to an excessive volume of blood received in culture bottles   Culture   Final    NO GROWTH 5 DAYS Performed at [facility], [place] [address] [place], [place] [address]    Report Status 01/29/2018 FINAL  Final  Blood Culture (routine x 2)     Status: None   Collection Time: 01/24/18 12:15 PM  Result Value Ref Range Status   Specimen Description BLOOD RIGHT ANTECUBITAL  Final   Special Requests   Final    BOTTLES DRAWN AEROBIC AND ANAEROBIC Blood Culture adequate volume   Culture   Final    NO GROWTH 5 DAYS Performed at [facility], [place] [address], [place], [place] [address]    Report Status 01/29/2018 FINAL  Final  MRSA PCR Screening     Status: None   Collection Time: 01/24/18  6:01 PM  Result Value Ref Range Status   MRSA by PCR NEGATIVE NEGATIVE Final    Comment:        The GeneXpert MRSA Assay (FDA approved for NASAL specimens only), is one component of a comprehensive MRSA colonization surveillance program. It is not intended to diagnose MRSA infection nor to guide or monitor treatment for MRSA infections. Performed at [facility], [place] [address] [place], [place] [address]       Studies: Dg Chest Port 1 View  Result Date: 01/30/2018 CLINICAL DATA:  Shortness of breath.  Cough. EXAM: PORTABLE CHEST 1 VIEW COMPARISON:  04/11/2017. FINDINGS: Cardiomegaly with diffuse bilateral pulmonary infiltrates consistent with pulmonary edema. Bilateral pleural effusions. No pneumothorax. No acute bony abnormality. IMPRESSION: Cardiomegaly with diffuse bilateral pulmonary infiltrates consistent with pulmonary edema. Bilateral pleural effusions. Findings have progressed from prior exam. Electronically Signed   By: Leola   On: 01/30/2018 09:12    Scheduled Meds: . apixaban  5 mg Oral BID  . atorvastatin  10 mg Oral QPM  . azaTHIOprine  50 mg Oral BID  . brimonidine  1 drop Both Eyes BID  . brinzolamide  1 drop Both Eyes BID  . docusate  sodium  100 mg Oral BID  . fluticasone  1 spray Each Nare Daily  . furosemide  40 mg Intravenous Daily  . guaiFENesin  1,200 mg Oral BID  . ipratropium-albuterol  3 mL Nebulization BID  . latanoprost  1 drop Both Eyes QHS  . levothyroxine  200 mcg Oral QAC breakfast  . mouth rinse  15 mL Mouth Rinse BID  . [START ON ] methylPREDNISolone  4 mg Oral 4X daily taper  . metoprolol tartrate  2.5 mg Intravenous Once  . metoprolol tartrate  25 mg Oral BID  . mometasone-formoterol  2 puff Inhalation BID  . morphine  15 mg Oral BID  . pantoprazole  40 mg Oral BID  . polycarbophil  625 mg Oral QHS  .  sertraline  50 mg Oral Daily  . sodium chloride HYPERTONIC  4 mL Nebulization BID    Continuous Infusions: . sodium chloride 250 mL (01/25/18 0325)  . piperacillin-tazobactam (ZOSYN)  IV 3.375 g (01/30/18 0247)     LOS: 6 days     Kayleen Memos, MD [facility] Pager [phone]  If 7PM-7AM, please contact night-coverage [email or web address] Password [facility] 01/30/2018, 12:25 PM

## 2018-01-30 NOTE — Progress Notes (Signed)
Patient will DC to: [facility] Anticipated DC date: 01/30/18 Family notified: [name] Transport by: Corey Harold  Per MD patient ready for DC to [facility] . RN, patient, patient's family, and facility notified of DC. Discharge Summary sent to facility. RN given number for report [phone]. DC packet on chart. Ambulance transport requested for patient.  CSW signing off.  [place], [place]

## 2018-01-30 NOTE — Evaluation (Signed)
Physical Therapy Evaluation Patient Details Name: Travis Ross MRN: [id number] DOB: 1926/03/25 Today's Date: 01/30/2018   History of Present Illness  Pt is an 70 y/omalewith h/oCOPD, emphysema, HTN, hypothyroidism, chronic a-fib, hearing loss and myasthenia gravis with chronic dysphagia who resides at [facility] and has had recurrent admissions for aspiration PNA (in 12/18, 6/19 and 9/19) brought in 2 SOB, hypoxia, persistent wet cough, and failure of outpatient treatment for PNA. Patient is w/c bound at baseline. Admitted for sepsis 2 PNA.   Clinical Impression  Pt admitted with above diagnosis. Pt currently presents with weakness (L UE>RUE) and cardiopulmonary impairments limiting his ability to perform bed mobility and transfers independently. Sit to stand transfer with max A +2 to [facility], transferred to chair, pt had fecal incontinence episode and O2 desatted to 70% on 3L/min of [place] O2 sitting in chair. Nurse in room and advised increase in supplemental O2 to 4L/min. Lateral transfer to bed from fully reclined chair total A +3 due to O2 status and need for pericare. Pt educated on the importance of being upright in bed for cardiopulmonary function. PLOF included Stedy transfers to chair and assist with ADLs at SNF. Pt will benefit from skilled PT to increase his independence and safety with mobility to allow discharge back to SNF. PT will follow.        Follow Up Recommendations SNF    Equipment Recommendations  None recommended by PT    Recommendations for Other Services       Precautions / Restrictions Precautions Precautions: Fall Restrictions Weight Bearing Restrictions: No      Mobility  Bed Mobility Overal bed mobility: Needs Assistance Bed Mobility: Rolling;Supine to Sit Rolling: +2 for physical assistance;Max assist   Supine to sit: HOB elevated;Mod assist;+2 for physical assistance     General bed mobility comments: Pt able to slowly progress legs partially towards  EOB, needed mod A to power trunk upright and legs off EOB. Rolling for pericare with nurse, easier to roll R>L, can help support self in sidelying with bedrail and UE support. SpO2 91%.  Transfers Overall transfer level: Needs assistance   Transfers: Sit to/from Stand;Lateral/Scoot Transfers Sit to Stand: Max assist;+2 physical assistance        Lateral/Scoot Transfers: Total assist;+2 physical assistance General transfer comment: Max A +2 for power to stand with Stedy, transfered to recliner. Pt fecal incontinence and desat of SpO2 (70%) led to lateral scoot transfer with max A +3 from fully reclined chair. Return to bed for rolling and clean up. Pt HR spiked to mid 150s bpm  Ambulation/Gait                Stairs            Wheelchair Mobility    Modified Rankin (Stroke Patients Only)       Balance Overall balance assessment: Needs assistance Sitting-balance support: Bilateral upper extremity supported;Feet supported Sitting balance-Leahy Scale: Poor Sitting balance - Comments: Able to support himself with bilateral UE for short bouts of time Postural control: Posterior lean;Right lateral lean                                   Pertinent Vitals/Pain Pain Assessment: No/denies pain    Home Living Family/patient expects to be discharged to:: Skilled nursing facility([facility])  Prior Function Level of Independence: Needs assistance   Gait / Transfers Assistance Needed: Receives assist for stand pivot transfer with steady or "two big men helping him." Pt states he does not bear weight through legs  ADL's / Homemaking Assistance Needed: Assist with bathing, dressing, toileting and self feeding        Hand Dominance        Extremity/Trunk Assessment   Upper Extremity Assessment Upper Extremity Assessment: RUE deficits/detail;LUE deficits/detail RUE Deficits / Details: pt able to lift UE to shoulder height,  can use to provide some stability in sitting, stated cannot use arms and hands for much since cervical cord compresion, has numbness RUE Sensation: history of peripheral neuropathy LUE Deficits / Details: has difficulty lifting arm for don/doffing gait belt and gown, stated cannot use arms and hands for much since cervical cord compression, has numbness LUE Sensation: history of peripheral neuropathy    Lower Extremity Assessment Lower Extremity Assessment: RLE deficits/detail;LLE deficits/detail RLE Deficits / Details: Pt has difficulty keeping knee facing forward in sitting, internally rotates femur. Can pump ankles through partial range. LLE Deficits / Details: Can pump ankles, lift leg to assist with removing prevalon boots.       Communication   Communication: HOH;Expressive difficulties  Cognition Arousal/Alertness: Awake/alert Behavior During Therapy: WFL for tasks assessed/performed Overall Cognitive Status: No family/caregiver present to determine baseline cognitive functioning                                 General Comments: Pt able to provide information relative to PLOF consistent with other HCP notes, some short term memory deficits noted. Pt responds to simple commands with increased time.      General Comments General comments (skin integrity, edema, etc.): Previous scab on L lower leg re-opened, nurse alerted and cared for during session.     Exercises     Assessment/Plan    PT Assessment Patient needs continued PT services  PT Problem List Decreased strength;Decreased mobility;Decreased activity tolerance;Cardiopulmonary status limiting activity;Decreased balance       PT Treatment Interventions Therapeutic exercise;Patient/family education;Therapeutic activities;Balance training;Wheelchair mobility training;Functional mobility training;Neuromuscular re-education    PT Goals (Current goals can be found in the Care Plan section)  Acute Rehab PT  Goals Patient Stated Goal: go to [facility] PT Goal Formulation: With patient Time For Goal Achievement: 02/13/18 Potential to Achieve Goals: Good    Frequency Min 2X/week   Barriers to discharge        Co-evaluation               AM-PAC PT "6 Clicks" Mobility  Outcome Measure Help needed turning from your back to your side while in a flat bed without using bedrails?: A Lot Help needed moving from lying on your back to sitting on the side of a flat bed without using bedrails?: A Lot Help needed moving to and from a bed to a chair (including a wheelchair)?: Total Help needed standing up from a chair using your arms (e.g., wheelchair or bedside chair)?: Total Help needed to walk in hospital room?: Total Help needed climbing 3-5 steps with a railing? : Total 6 Click Score: 8    End of Session Equipment Utilized During Treatment: Gait belt;Oxygen Activity Tolerance: Treatment limited secondary to medical complications (Comment)(oxygen desaturating to mid 70s%) Patient left: in bed;with bed alarm set;Other (comment);with call bell/phone within reach(with prevalon boots) Nurse Communication: Mobility status PT Visit  Diagnosis: Muscle weakness (generalized) (M62.81);Other abnormalities of gait and mobility (R26.89)    Time: 0786-7544 PT Time Calculation (min) (ACUTE ONLY): 51 min   Charges:   PT Evaluation $PT Eval High Complexity: 1 High PT Treatments $Therapeutic Activity: 23-37 mins        Gilda Crease, SPT Acute Rehab Services [phone]   Gilda Crease 01/30/2018, 12:51 PM

## 2018-01-30 NOTE — Progress Notes (Signed)
 Daily Progress Note   Patient Name: Travis Ross       Date: 01/30/2018 DOB: Jul 16, 1926  Age: 84 y.o. MRN#: [id number] Attending Physician: Kayleen Memos, DO Primary Care Physician: Virgel Bouquet, MD Admit Date: 01/24/2018  Reason for Consultation/Follow-up: Establishing goals of care and Psychosocial/spiritual support  Subjective: Called to bedside by social work and family to talk with patient about prognosis and discharge to hospice house.  I spoke with the patient's two daughters about hs condition, their thoughts, and concerns.  Then we met with the patient.  He thought I was his niece and spoke quite a bit about the family to me.  I explained that he may have less time than we had hoped.  He was not surprised.  We discussed Hospice House.  Patient was fine with going to [facility] as long as his daughters were for it.    Hospice Liaison joined our conversation to meet with patient and family.  A bed is available today and patient will be transported today.   Assessment: Patient with recurrent aspiration pneumonia on 4L, SOB at rest.   Patient Profile/HPI:  84 y.o. male admitted on 01/24/2018 from [facility] with complaints of cough and shortness of breath.  He has a past medical history significant for COPD, emphysema, hypertension, hyperlipidemia, atrial fibrillation ([place]), myasthenia gravis, dysphagia, hard of hearing, recurrent pneumonia, right femur fracture, cervical cord compression with residual hand paraesthesia, and osteoarthritis. Patient has recurrent admission for aspiration pneumonia (4 times since December 2018). Patient presented to ED from facility with shortness of breath, hypoxia , cough, and failure of outpatient treatment with Levaquin for pneumonia. Patient  was seen during his previous admission and advised to of a mechanical soft diet with thicken liquids. Daughters reported patient has not complied with recommendations and they are aware of risk of recurrent aspiration. Daughters report patient is wheel-chair bound. He is alert and oriented at baseline. During his ED course oxygen saturation 85% on room air. Patient initiated on nasal cannula for support. WBC 15.1, K 2.2, Albumin 2.3, chest x-ray showed bilateral lower lobe pneumonia, more severe on the left, new versus progression of pneumonia right lower lobe compared to previous. Since admission he continues with shortness of breath and congestive cough.  Thoracentesis 12/2 removed 900 ml of fluid, but unfortunately his  symptoms are not improved.    Length of Stay: 6  Current Medications: Scheduled Meds:  . apixaban  5 mg Oral BID  . atorvastatin  10 mg Oral QPM  . azaTHIOprine  50 mg Oral BID  . brimonidine  1 drop Both Eyes BID  . brinzolamide  1 drop Both Eyes BID  . docusate sodium  100 mg Oral BID  . fluticasone  1 spray Each Nare Daily  . furosemide  40 mg Intravenous Daily  . guaiFENesin  1,200 mg Oral BID  . ipratropium-albuterol  3 mL Nebulization BID  . latanoprost  1 drop Both Eyes QHS  . levothyroxine  200 mcg Oral QAC breakfast  . mouth rinse  15 mL Mouth Rinse BID  . [START ON ] methylPREDNISolone  4 mg Oral 4X daily taper  . metoprolol tartrate  2.5 mg Intravenous Q3H  . mometasone-formoterol  2 puff Inhalation BID  . morphine  15 mg Oral BID  . pantoprazole  40 mg Oral BID  . polycarbophil  625 mg Oral QHS  . sertraline  50 mg Oral Daily  . sodium chloride HYPERTONIC  4 mL Nebulization BID    Continuous Infusions: . sodium chloride 250 mL (01/25/18 0325)  . piperacillin-tazobactam (ZOSYN)  IV 3.375 g (01/30/18 0247)    PRN Meds: sodium chloride, acetaminophen, albuterol, food thickener, hydrOXYzine, lidocaine, magnesium hydroxide, ondansetron **OR**  ondansetron (ZOFRAN) IV, senna, temazepam  Physical Exam        Frail elderly gentleman.  Pleasantly confused on 3L of oxygen.  SOB when speaking.  Rambling speech. CV tachy Resp coarse sounds with increased work of breathing Abdomen soft, nt, nd  Vital Signs: BP (!) 158/92 (BP Location: Left Arm)   Pulse 87   Temp (!) 97.5 F (36.4 C) (Oral)   Resp 16   Ht _0  (1.854 m)   Wt 89.8 kg   SpO2 92%   BMI 26.12 kg/m  SpO2: SpO2: 92 % O2 Device: O2 Device: Nasal Cannula O2 Flow Rate: O2 Flow Rate (L/min): 3 L/min  Intake/output summary:   Intake/Output Summary (Last 24 hours) at 01/30/2018 1408 Last data filed at 01/30/2018 1331 Gross per 24 hour  Intake 500 ml  Output 0 ml  Net 500 ml   LBM: Last BM Date: 01/28/18 Baseline Weight: Weight: 89.8 kg Most recent weight: Weight: 89.8 kg       Palliative Assessment/Data:  20%   Flowsheet Rows     Most Recent Value  Intake Tab  Referral Department  Hospitalist  Unit at Time of Referral  -- [[facility]]  Date Notified  01/25/18  Palliative Care Type  Return patient Palliative Care  Reason for referral  Clarify Goals of Care  Date of Admission  01/24/18  Date first seen by Palliative Care  01/25/18  # of days Palliative referral response time  0 Day(s)  # of days IP prior to Palliative referral  1  Clinical Assessment  Psychosocial & Spiritual Assessment  Palliative Care Outcomes      Patient Active Problem List   Diagnosis Date Noted  . Dyspnea   . Hypoxia   . Pressure injury of skin 11/05/2017  . Pneumonia 11/01/2017  . Chronic hypercapnic respiratory failure ([employer]) 09/01/2017  . Aspiration pneumonia of both lungs (HCC)/ chronic/ recurrent 08/29/2017  . Acute respiratory failure with hypoxia ([place]) 08/15/2017  . Hyperlipidemia 08/15/2017  . COPD with chronic bronchitis and emphysema ([place]) 08/15/2017  . GERD (gastroesophageal reflux disease) 08/15/2017  .  Hypothyroidism (acquired) 08/15/2017  . History of Cervical  spinal cord compression (HCC) 08/15/2017  . Myasthenia gravis ([place]) 08/15/2017  . Acute on chronic systolic heart failure ([place]) 08/15/2017  . Hypokalemia 08/15/2017  . Decubitus ulcer of sacral region, stage 1 08/15/2017  . Pressure injury of sacral region, stage 2 ([place]) 02/06/2017  . Acute on chronic systolic (congestive) heart failure ([place]) 02/06/2017  . Community acquired pneumonia 02/04/2017  . A-fib ([place])   . Periprosthetic fracture around internal prosthetic right knee joint 12/15/2016  . Failed total knee arthroplasty ([place]) 12/15/2016  . Permanent atrial fibrillation 12/05/2016  . Pre-operative cardiovascular examination 12/05/2016  . Venous stasis of both lower extremities 12/05/2016  . Essential hypertension     Palliative Care Plan    Recommendations/Plan:  Will not adjust medications as he is about to be discharged.  DC to [facility] today.  Goals of Care and Additional Recommendations:  Limitations on Scope of Treatment: Full Comfort Care  Code Status:  DNR  Prognosis:   Hours - Days   Discharge Planning:  Hospice facility  Care plan was discussed with daughters, patient, Hospice Liasion  Thank you for allowing the Palliative Medicine Team to assist in the care of this patient.  Total time spent:  35 min.     Greater than 50%  of this time was spent counseling and coordinating care related to the above assessment and plan.  Florentina Jenny, PA-C Palliative Medicine  Please contact Palliative MedicineTeam phone at [phone] for questions and concerns between 7 am - 7 pm.   Please see AMION for individual provider pager numbers.

## 2018-01-30 NOTE — Clinical Social Work Note (Signed)
Clinical Social Work Assessment  Patient Details  Name: Travis Ross MRN: [id number] Date of Birth: 1926-05-18  Date of referral:  01/30/18               Reason for consult:  Discharge Planning                Permission sought to share information with:  Case Manager, Facility Sport and exercise psychologist, Family Supports Permission granted to share information::  Yes, Verbal Permission Granted  Name::     [name]  Agency::  Hospice  Relationship::  daughter  Contact Information:  [phone]  Housing/Transportation Living arrangements for the past 2 months:  [place] of Information:  Patient Patient Interpreter Needed:  None Criminal Activity/Legal Involvement Pertinent to Current Situation/Hospitalization:  No - Comment as needed Significant Relationships:  Adult Children Lives with:  Self Do you feel safe going back to the place where you live?  No Need for family participation in patient care:  Yes (Comment)  Care giving concerns:  CSW received referral for possible hospice placement at time of discharge. Spoke with patient's daughter and POA [name] regarding possibility of SNF placement with hospice back at patient's home of [place], or patient going to hospice facility .  Patient's daughter expressed understanding of PT recommendation and are agreeable to hospice placement at time of discharge. CSW to continue to follow and assist with discharge planning needs.     Social Worker assessment / plan:  Spoke with patient's daughter and POA [name] concerning hospice facility options per doctor's recommendation.    Employment status:  Retired Nurse, adult PT Recommendations:  (Recommending hospice at this time) Information / Referral to community resources:  [facility])  Patient/Family's Response to care:  Patient's daughter  Recognizes need for hospice placement with preference for [facility].  Patient/Family's Understanding of and  Emotional Response to Diagnosis, Current Treatment, and Prognosis:  Patient/family is realistic regarding patient's need for hospice placement. Patient's daughter [name] expressed understanding of CSW role and discharge process as well as medical condition. No questions/concerns about plan or treatment.    Emotional Assessment Appearance:  Appears stated age Attitude/Demeanor/Rapport:  Unable to Assess Affect (typically observed):  Unable to Assess Orientation:  Oriented to Self, Oriented to  Time, Oriented to Place, Oriented to Situation Alcohol / Substance use:  Not Applicable Psych involvement (Current and /or in the community):  No (Comment)  Discharge Needs  Concerns to be addressed:  Discharge Planning Concerns Readmission within the last 30 days:  No Current discharge risk:  Dependent with Mobility Barriers to Discharge:  Continued Medical Work up   [employer], LCSW 01/30/2018, 2:04 PM

## 2018-01-30 NOTE — Care Management Important Message (Signed)
Important Message  Patient Details  Name: Travis Ross MRN: [id number] Date of Birth: 07/01/1926   Medicare Important Message Given:  No    [name] 01/30/2018, 8:56 AM

## 2018-02-28 DEATH — deceased

## 2019-01-25 IMAGING — RF DG SWALLOWING FUNCTION - NRPT [facility]
13 of 17 series · 13 of 24 positions shown · non-contrast
Comparison: none

[Series 1: run · 1 of 15 frames shown (1 of 13)]
[frame 1/15]
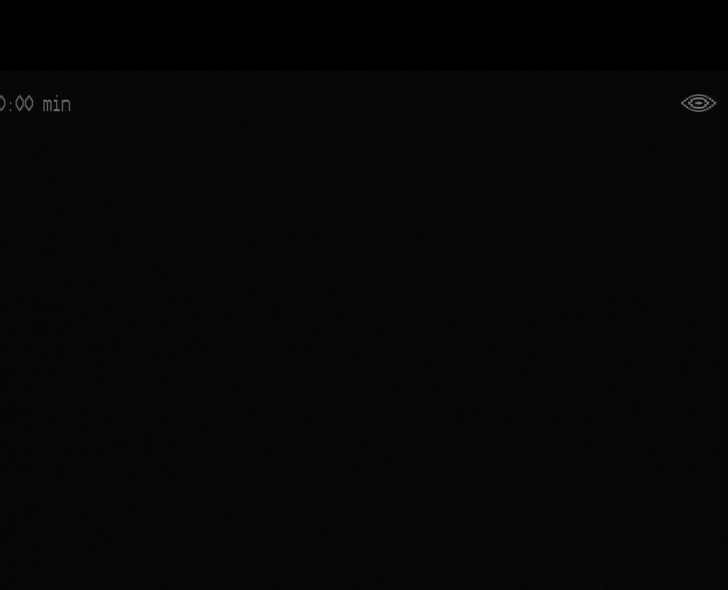

[Series 2: run · 1 of 14 frames shown (2 of 13)]
[frame 8/14]
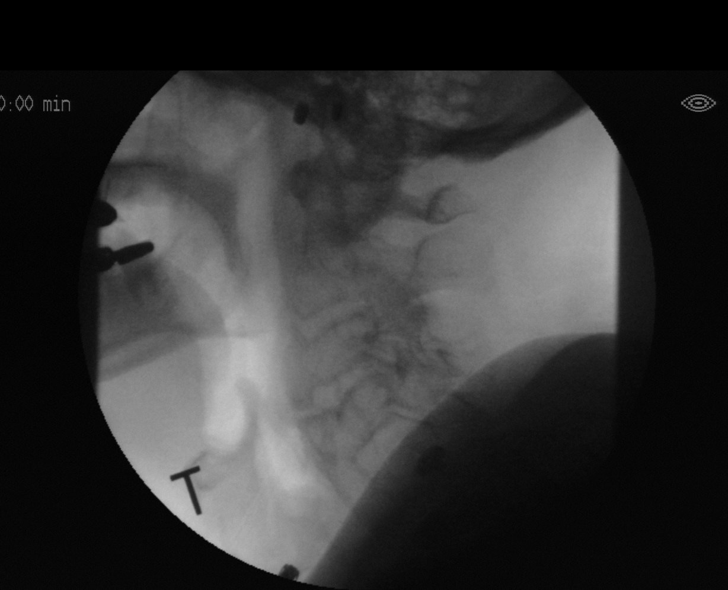

[Series 4: run · 1 of 26 frames shown (3 of 13)]
[frame 1/26]
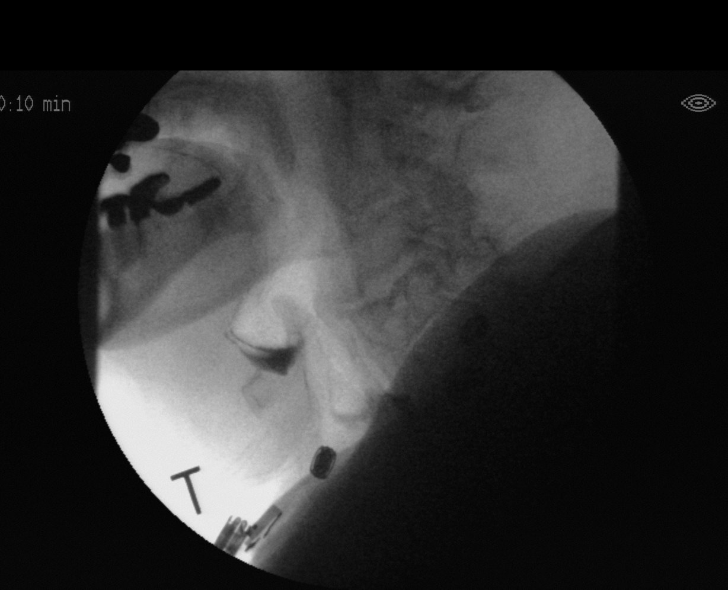

[Series 5: run · 1 of 23 frames shown (4 of 13)]
[frame 9/23]
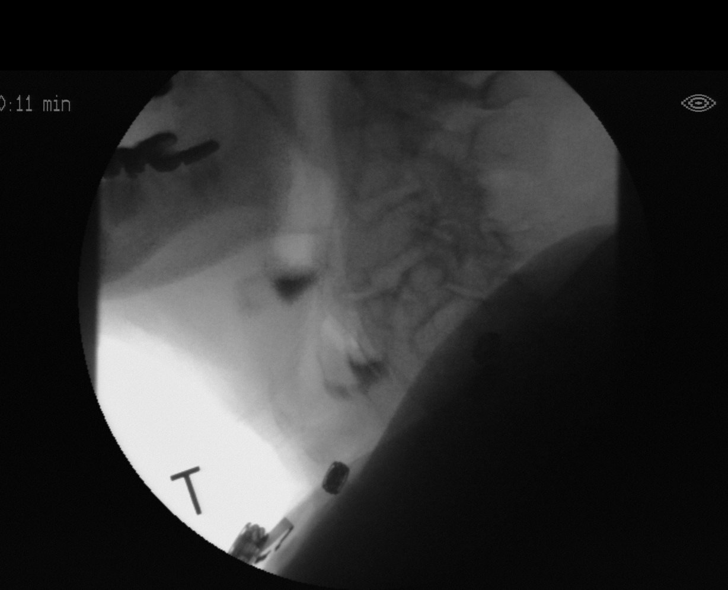

[Series 6: run · 1 of 133 frames shown (5 of 13)]
[frame 114/133]
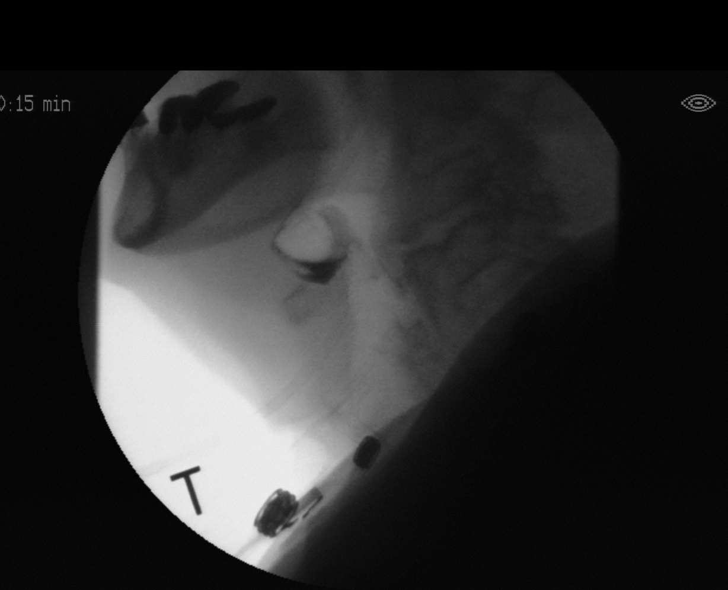

[Series 8: run · 1 of 259 frames shown (6 of 13)]
[frame 39/259]
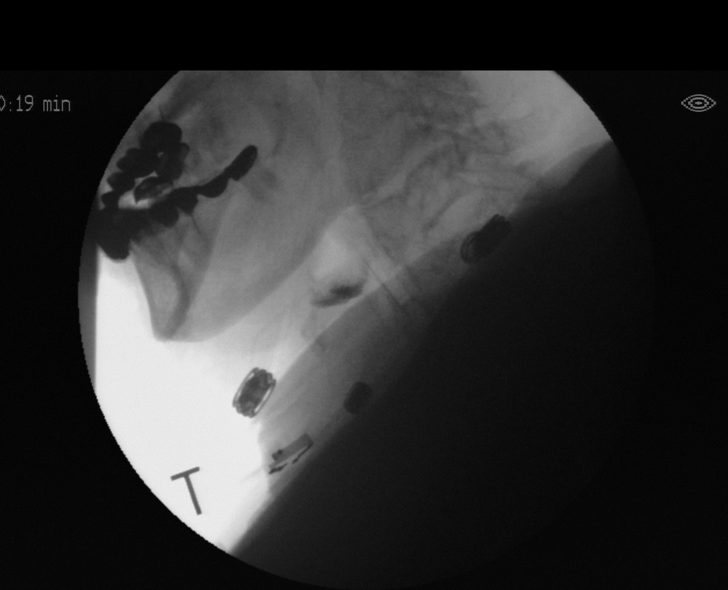

[Series 9: run · 1 of 623 frames shown (7 of 13)]
[frame 530/623]
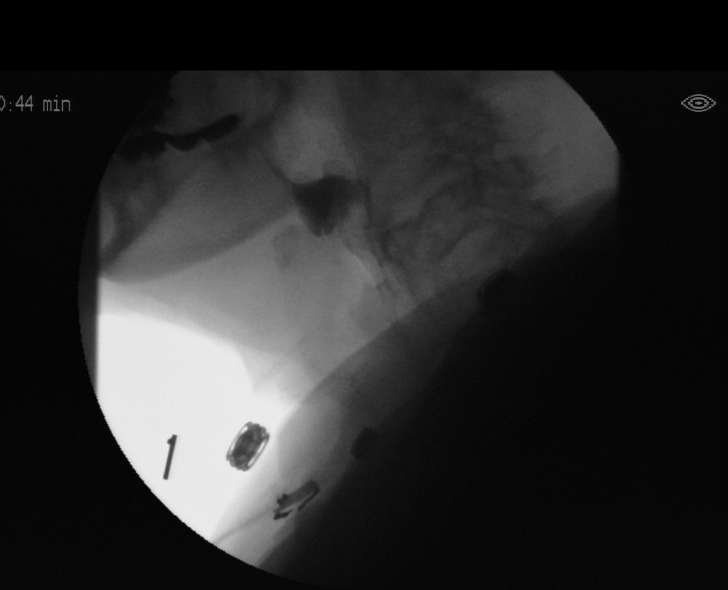

[Series 10: run · 1 of 258 frames shown (8 of 13)]
[frame 156/258]
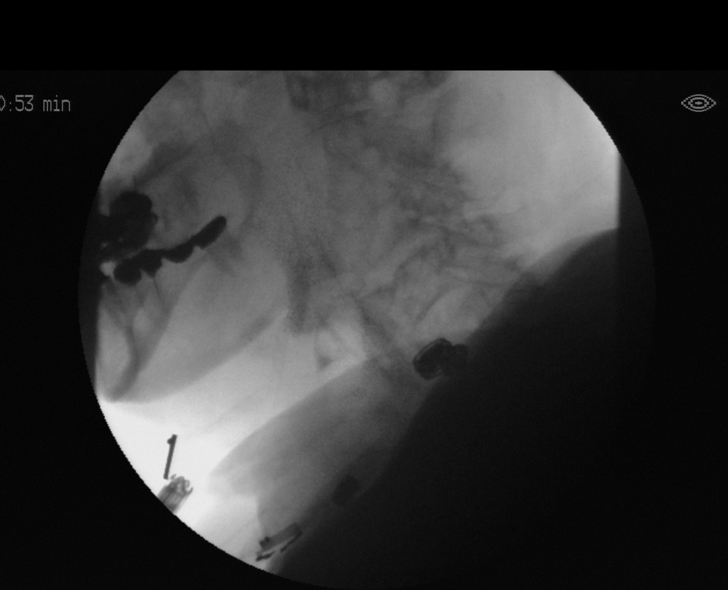

[Series 12: run · 1 of 87 frames shown (9 of 13)]
[frame 11/87]
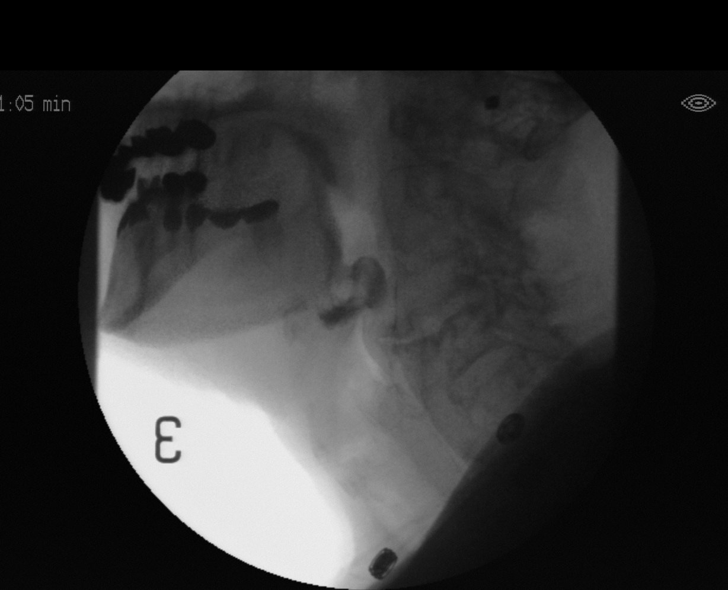

[Series 13: run · 1 of 217 frames shown (10 of 13)]
[frame 109/217]
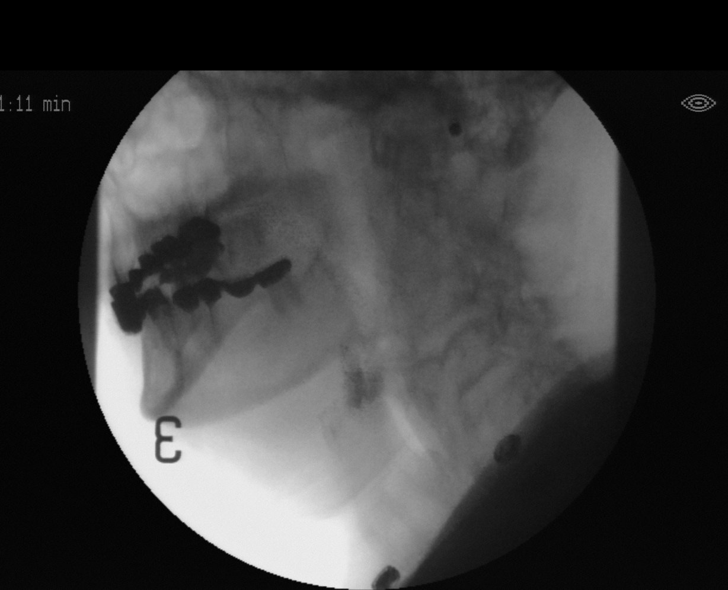

[Series 14: run · 1 of 65 frames shown (11 of 13)]
[frame 56/65]
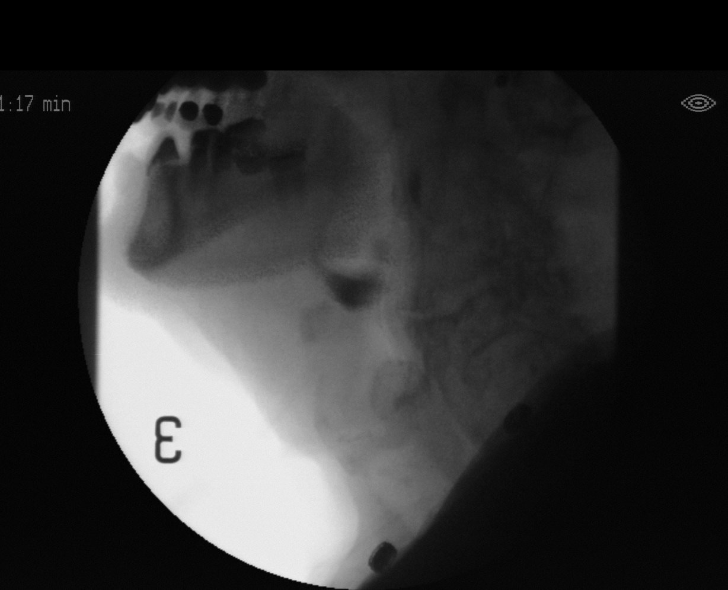

[Series 16: run · 1 of 182 frames shown (12 of 13)]
[frame 92/182]
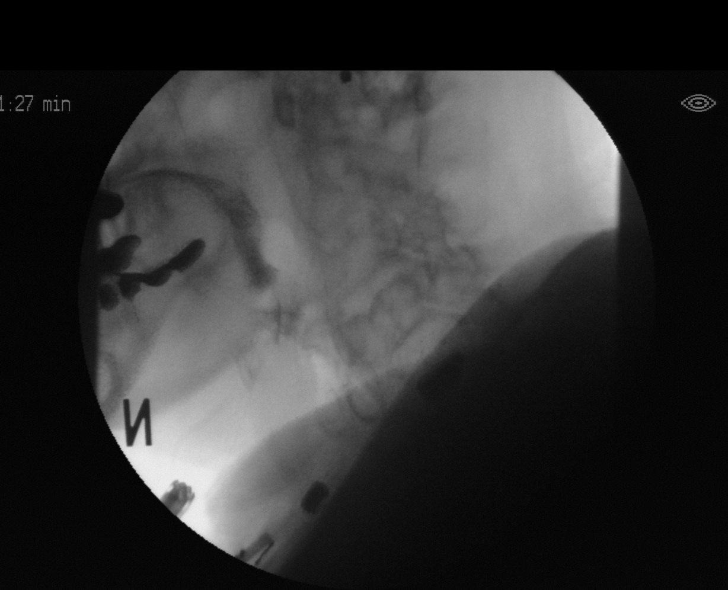

[Series 17: run · 1 of 431 frames shown (13 of 13)]
[frame 367/431]
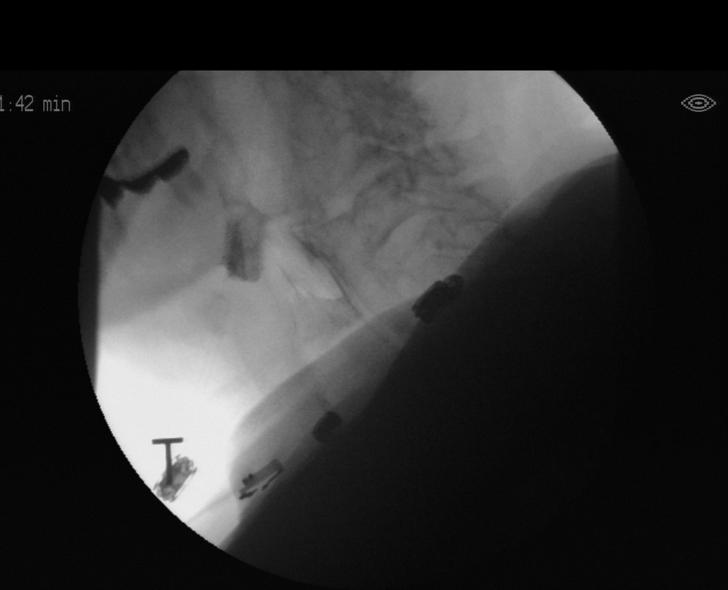

[13 of 24 positions shown; findings below may reference images not displayed]

FLUOROSCOPY FOR SWALLOWING FUNCTION STUDY:
Fluoroscopy was provided for swallowing function study, which was administered by a speech pathologist.  Final results and recommendations from this study are contained within the speech pathology report.

## 2019-01-26 IMAGING — DX DG CHEST 1V PORT
1 series · 1 of 1 positions shown · non-contrast
Comparison: 08/22/2017

CLINICAL DATA: Dyspnea

EXAM:
PORTABLE CHEST 1 VIEW

[chest ap]
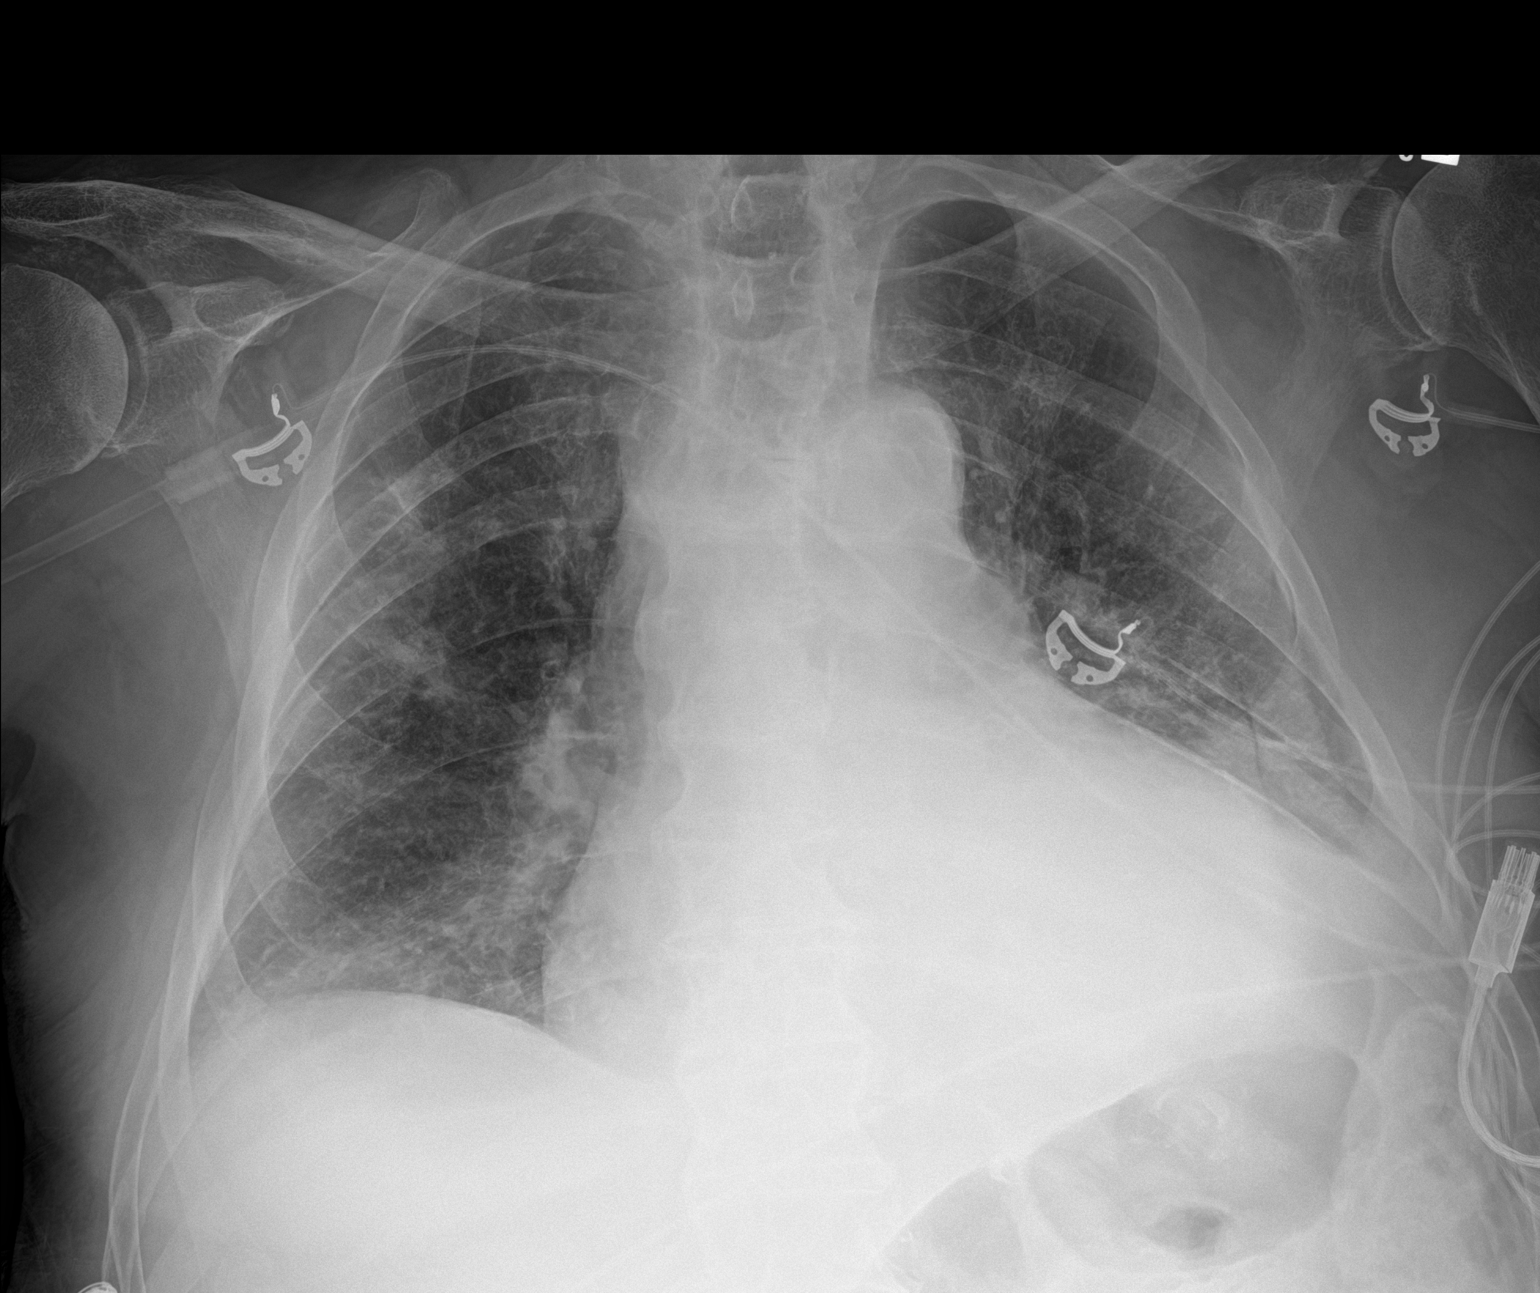

[1 of 1 positions shown; findings below may reference images not displayed]

FINDINGS: Cardiac shadow remains enlarged. Aortic calcifications are again
seen. Increasing left basilar infiltrate is noted with associated
effusion. Right lung is clear with some patchy changes in the right
mid lung and right base. These are slightly increased when compared
with the prior exam. No bony abnormality is seen.
IMPRESSION: Increasing bilateral infiltrative changes.

## 2019-06-28 IMAGING — DX DG CHEST 1V PORT
1 series · 2 of 2 positions shown · non-contrast
Comparison: January 16, 2018

CLINICAL DATA: Cough and fever her

EXAM:
PORTABLE CHEST 1 VIEW

[Series 1: chest ap · 0.14mm/px · 2 of 2 slices shown]
[im 1/2]
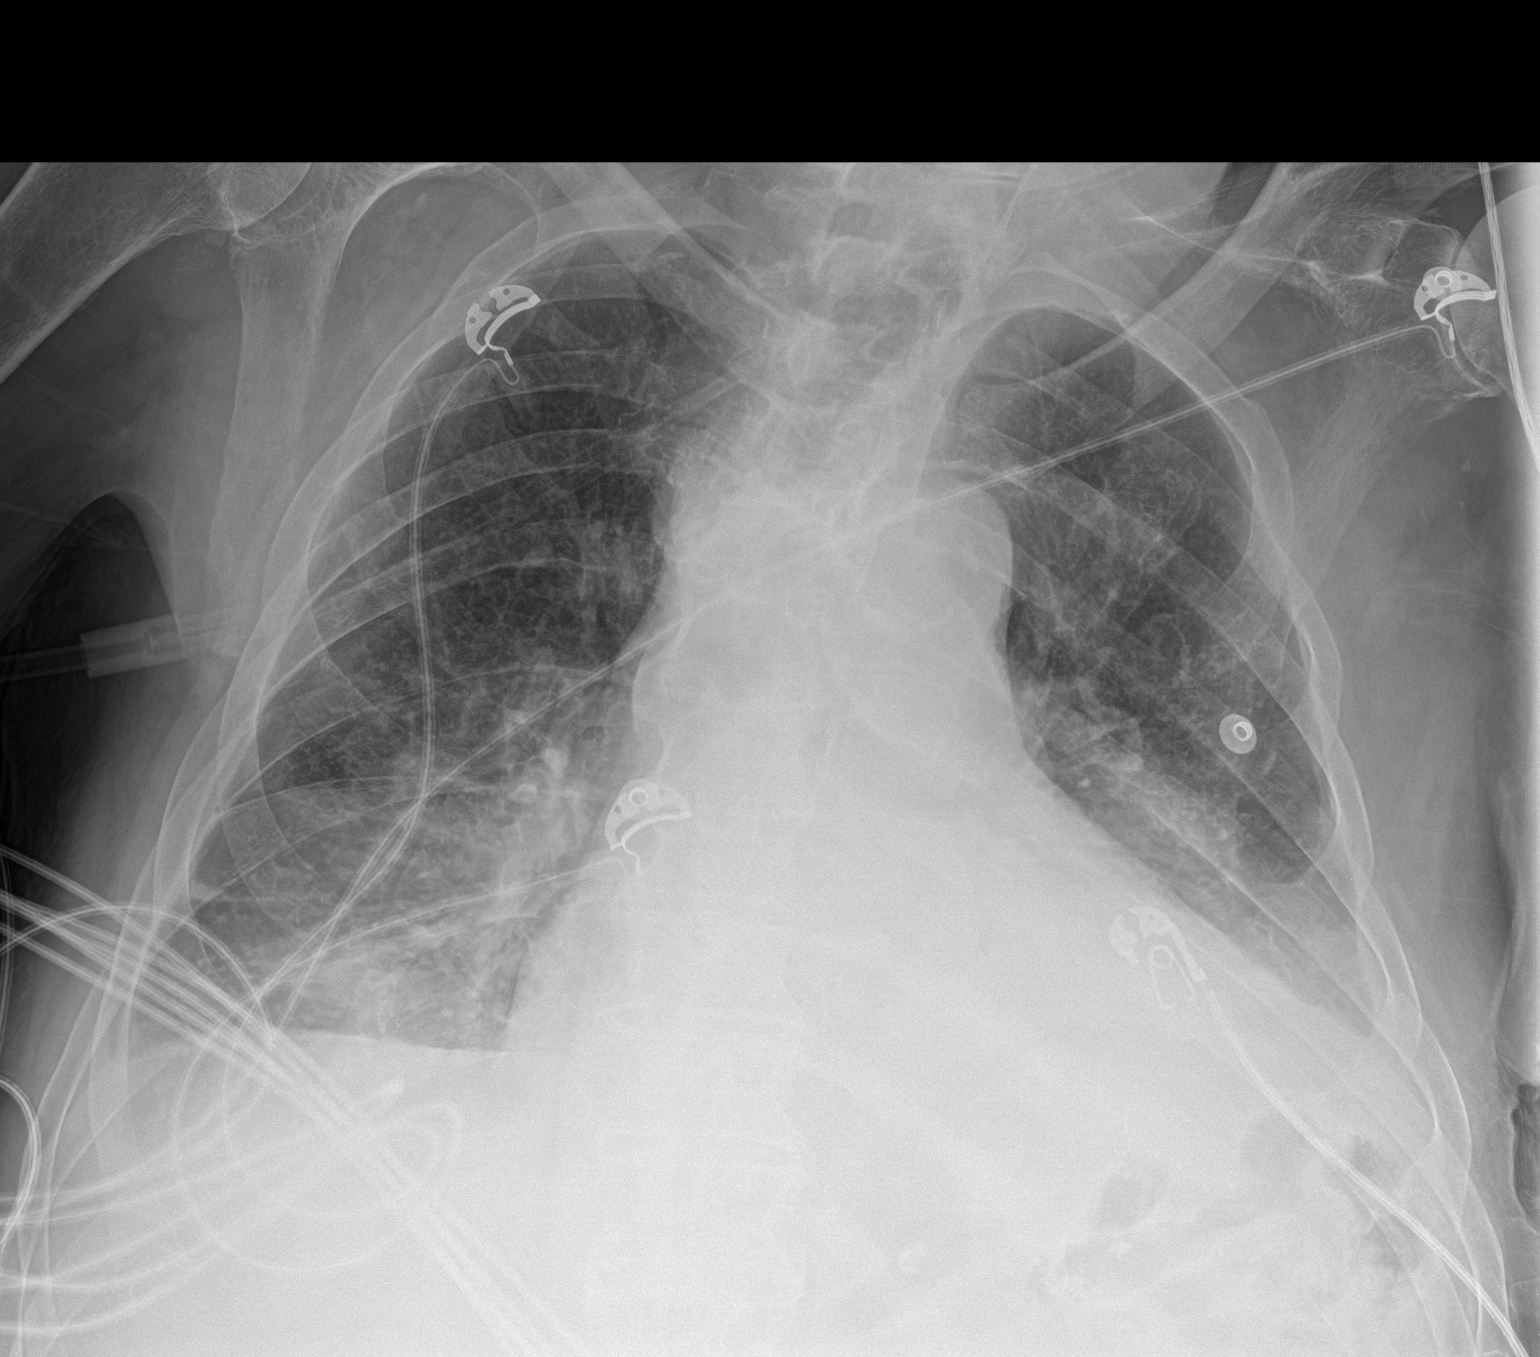
[im 2/2]
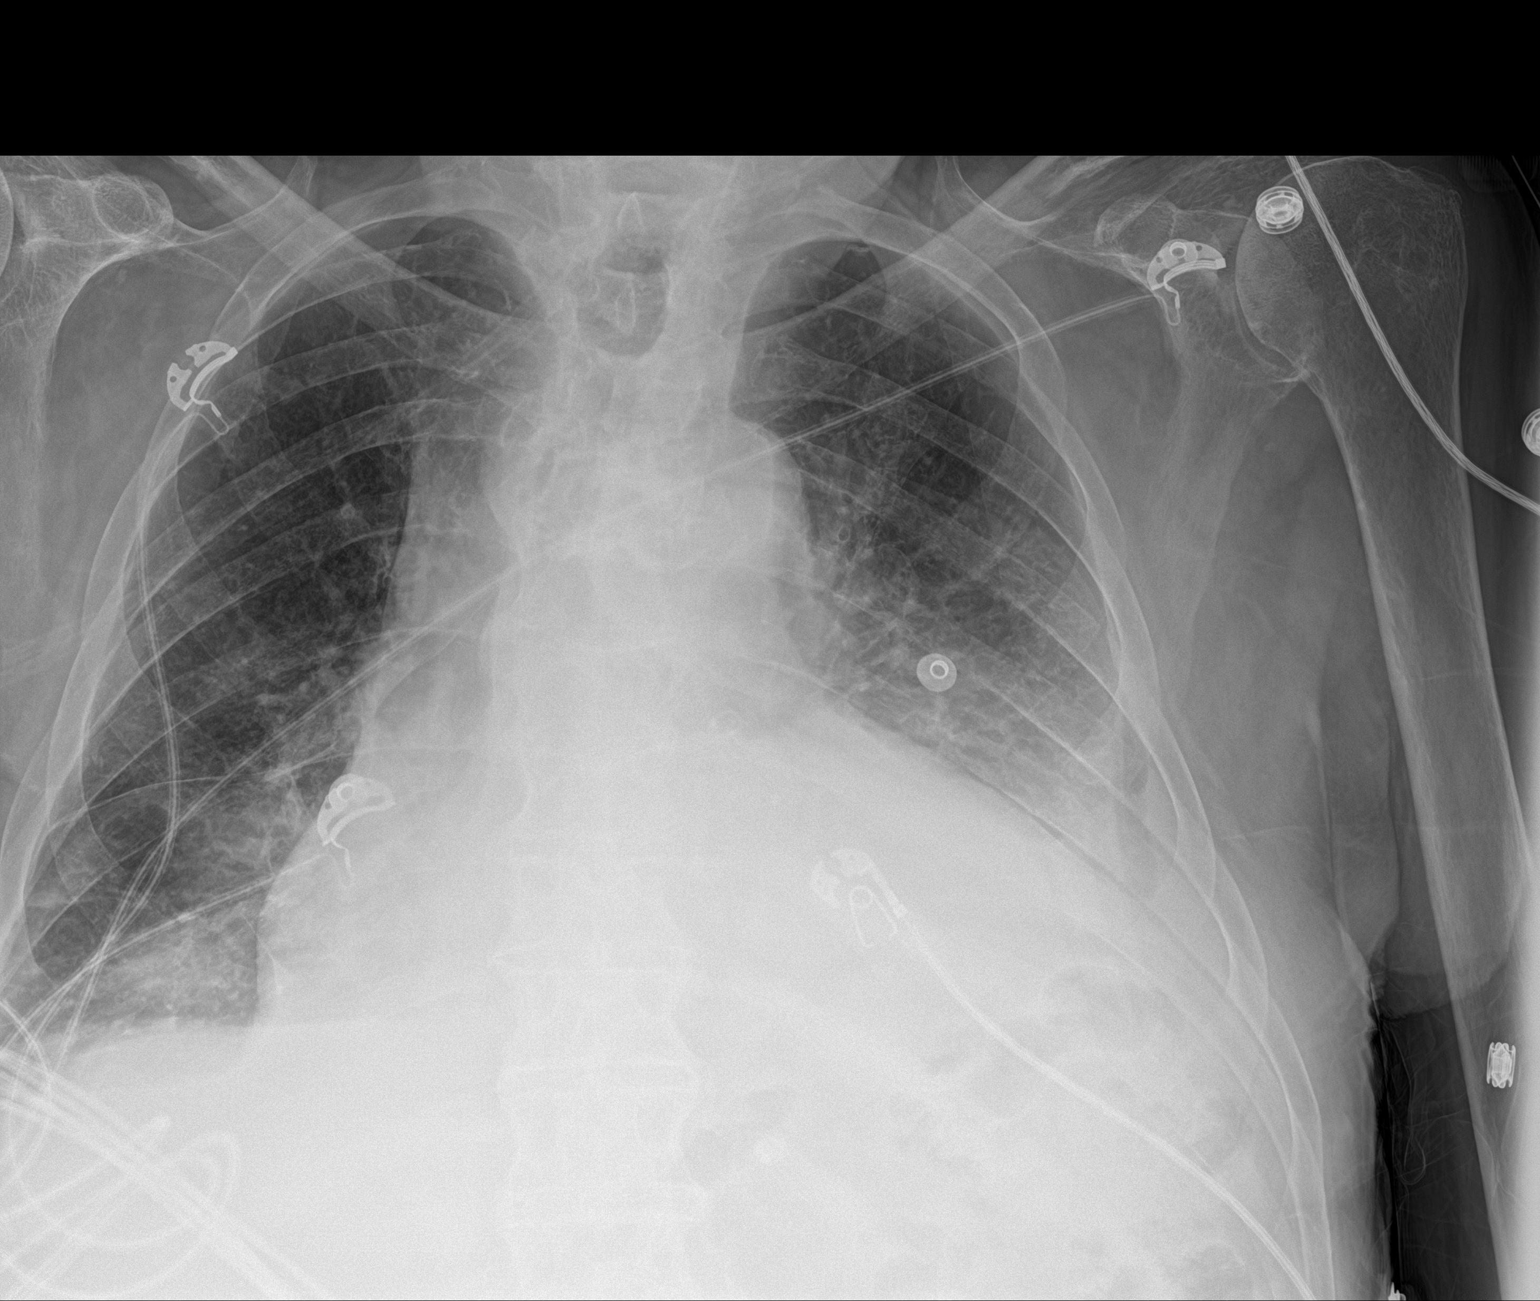

[2 of 2 positions shown; findings below may reference images not displayed]

FINDINGS: There is persistent airspace consolidation throughout the left lower
lobe with small left pleural effusion. There is increase in patchy
opacity in the right lower lobe, felt to represent progression of
pneumonia in this area. There is a small right pleural effusion.
There is cardiomegaly with pulmonary vascularity normal. No
adenopathy. There is aortic atherosclerosis. No bone lesions.
IMPRESSION: Bilateral lower lobe consolidation felt to represent pneumonia, more
severe on the left than on the right. New versus progression of
apparent pneumonia right lower lobe compared to prior study. Stable
cardiomegaly. No adenopathy evident. There is aortic
atherosclerosis.

Aortic Atherosclerosis ([id number].E).

## 2019-07-02 IMAGING — DX DG CHEST 1V PORT
1 series · 1 of 1 positions shown · non-contrast
Comparison: Radiographs 01/24/2018

CLINICAL DATA: Hypoxia.

EXAM:
PORTABLE CHEST 1 VIEW

[chest ap]
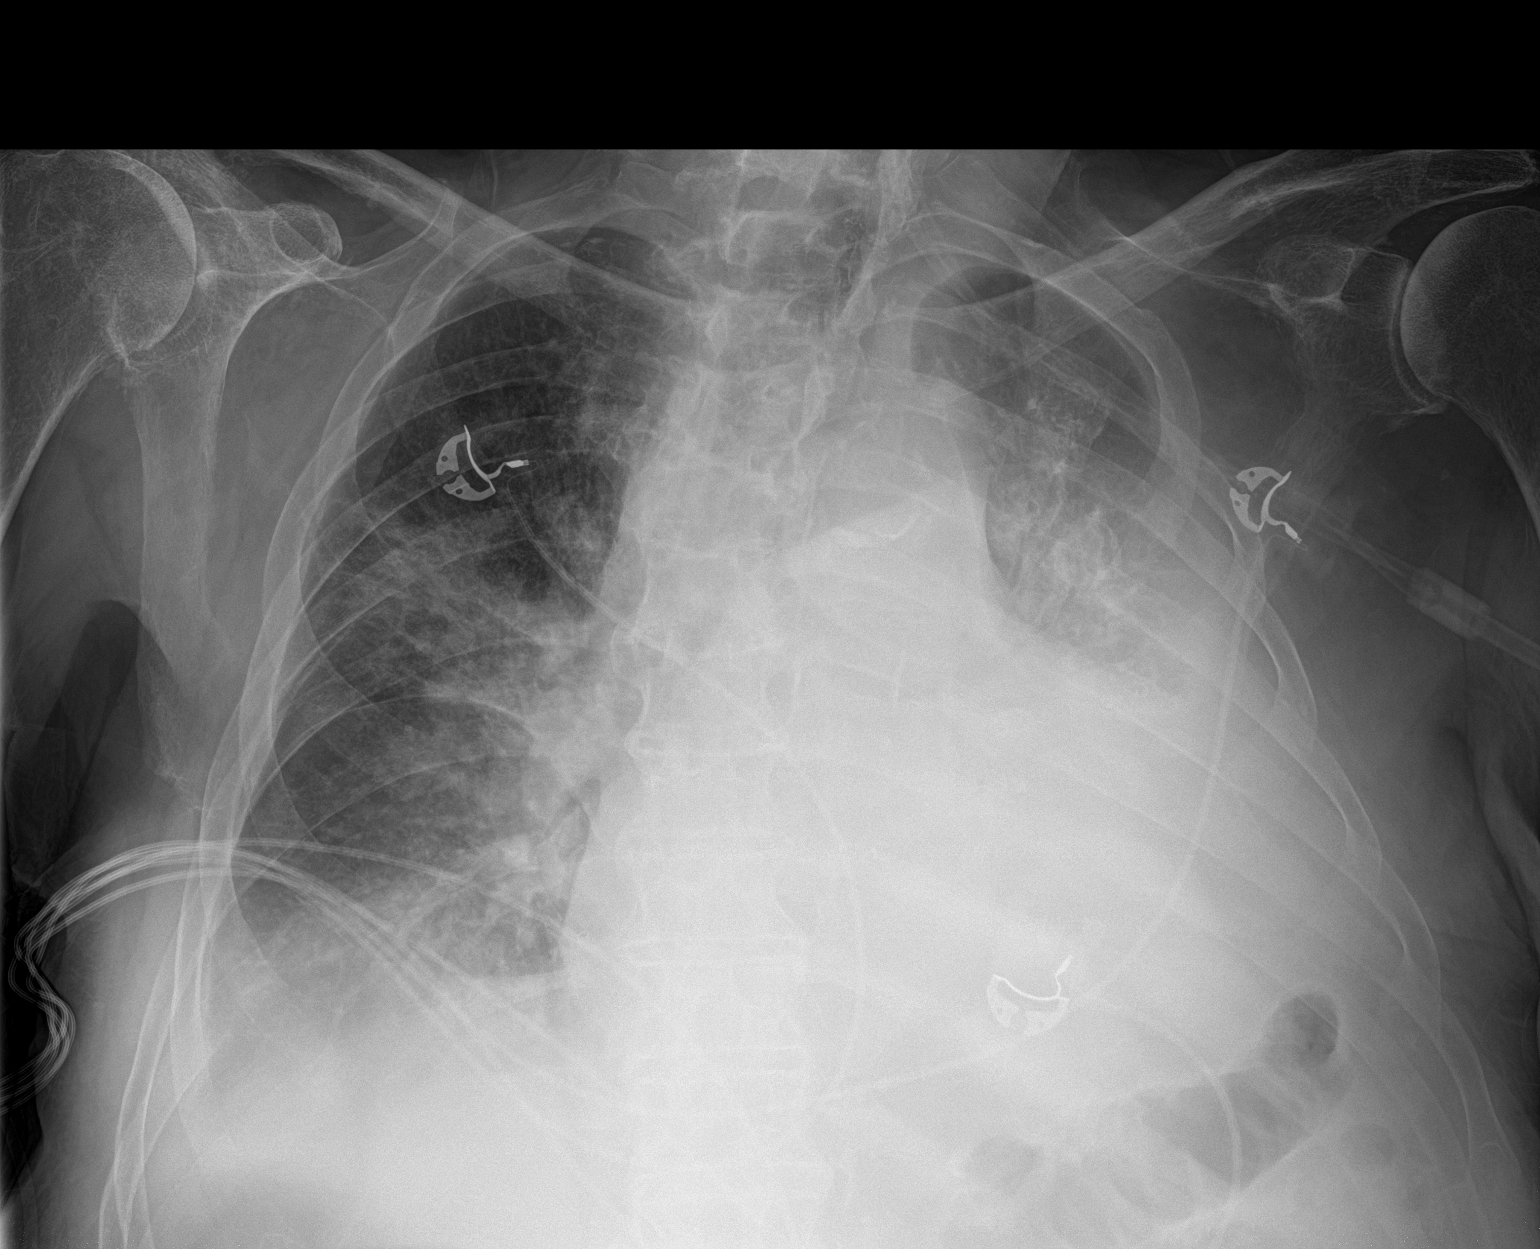

[1 of 1 positions shown; findings below may reference images not displayed]

FINDINGS: Bilateral pleural effusions have increased in the interim, left
greater than right, with associated bibasilar airspace disease.
Progression in bilateral perihilar opacities suspicious for
pulmonary edema. Cardiomegaly which is partially obscured on the
left. No pneumothorax.
IMPRESSION: Increased bilateral pleural effusions and bibasilar airspace
disease. Increasing bilateral perihilar opacities suspicious for
pulmonary edema. Cardiomegaly is unchanged.

## 2019-07-03 IMAGING — US IR THORACENTESIS ASP PLEURAL SPACE W/IMG GUIDE
1 series · 3 of 3 positions shown · non-contrast
Comparison: none

INDICATION: Patient with history of dyspnea and bilateral pleural effusions,
left greater than right. Request is made for therapeutic left
thoracentesis.

[Series 1: ir thoracentesis asp pleural space w/img guide · 3 of 3 slices shown]
[im 1/3]
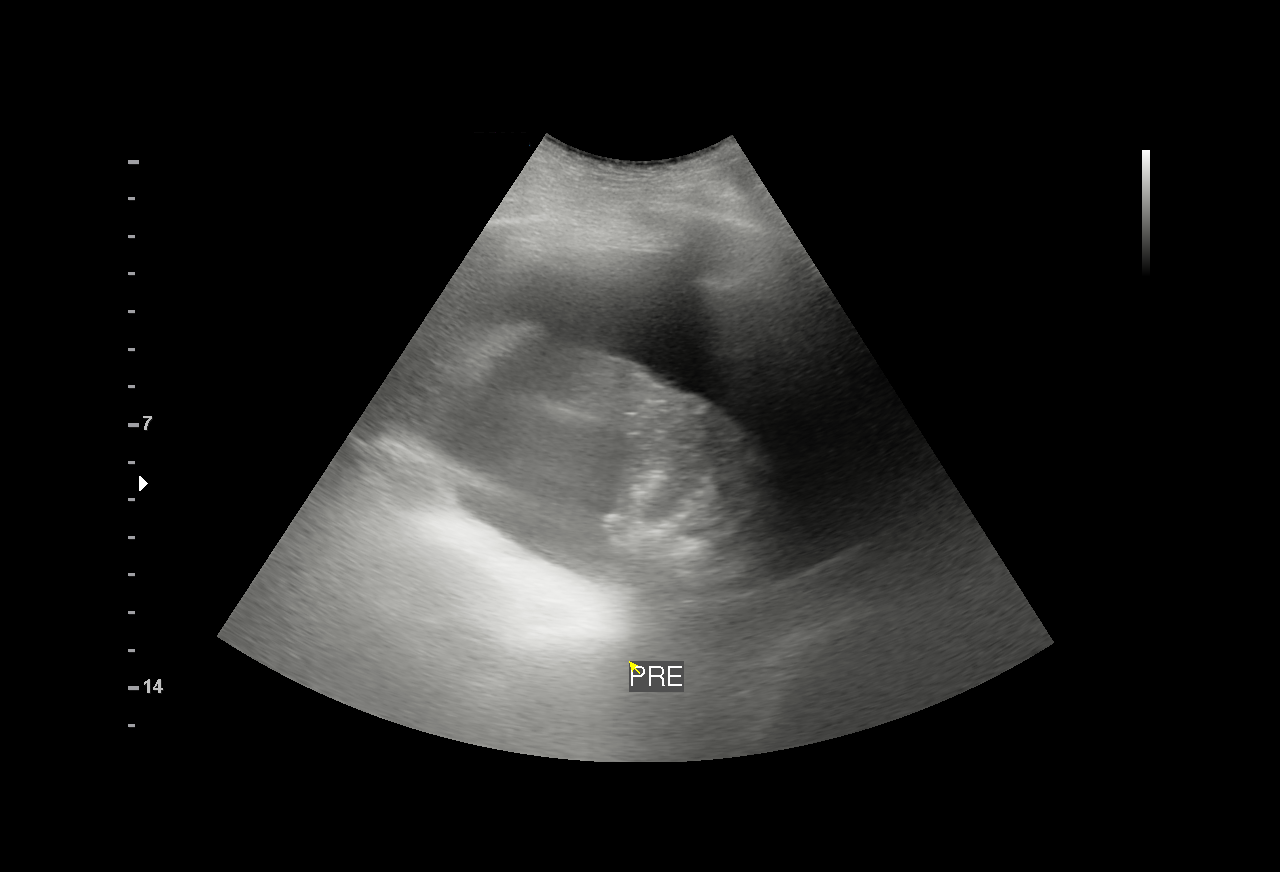
[im 2/3]
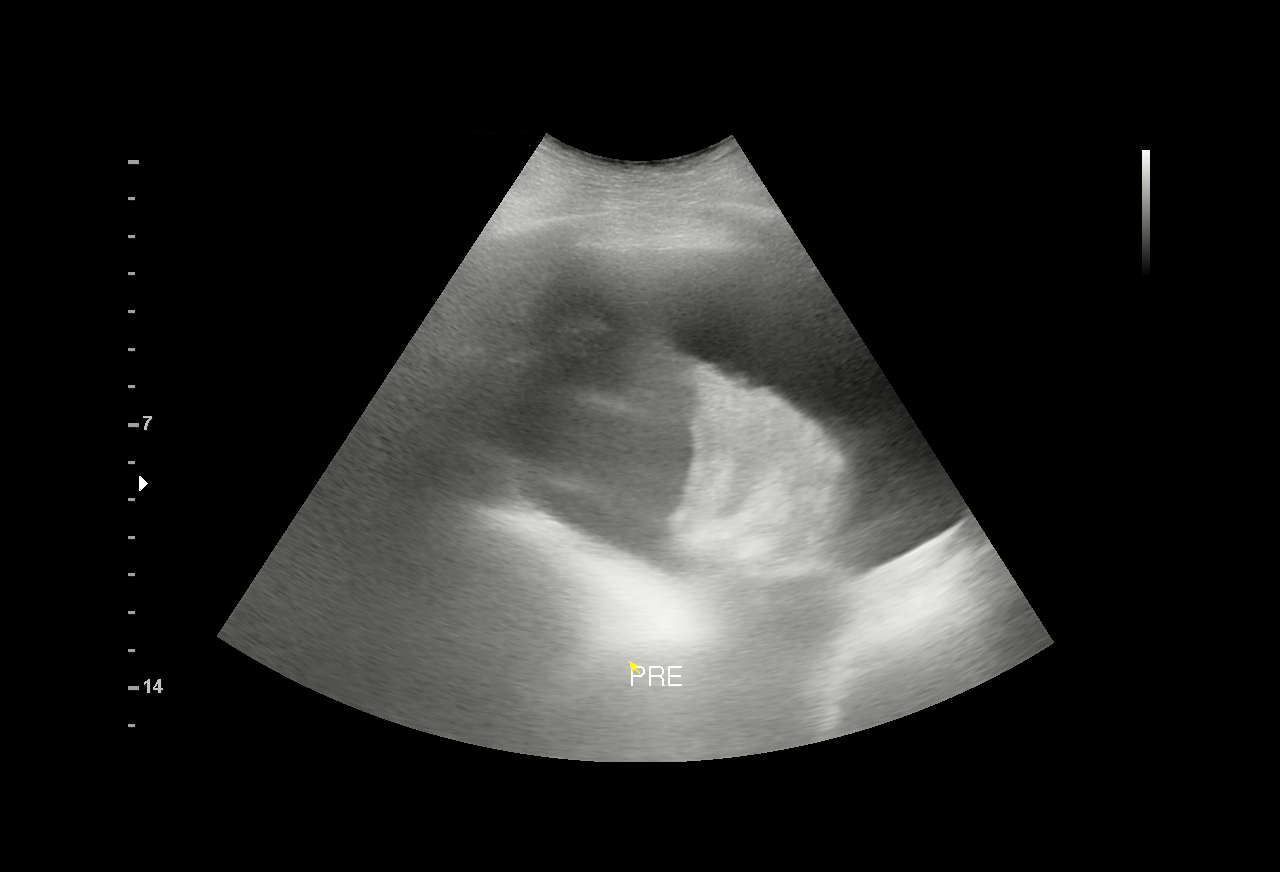
[im 3/3]
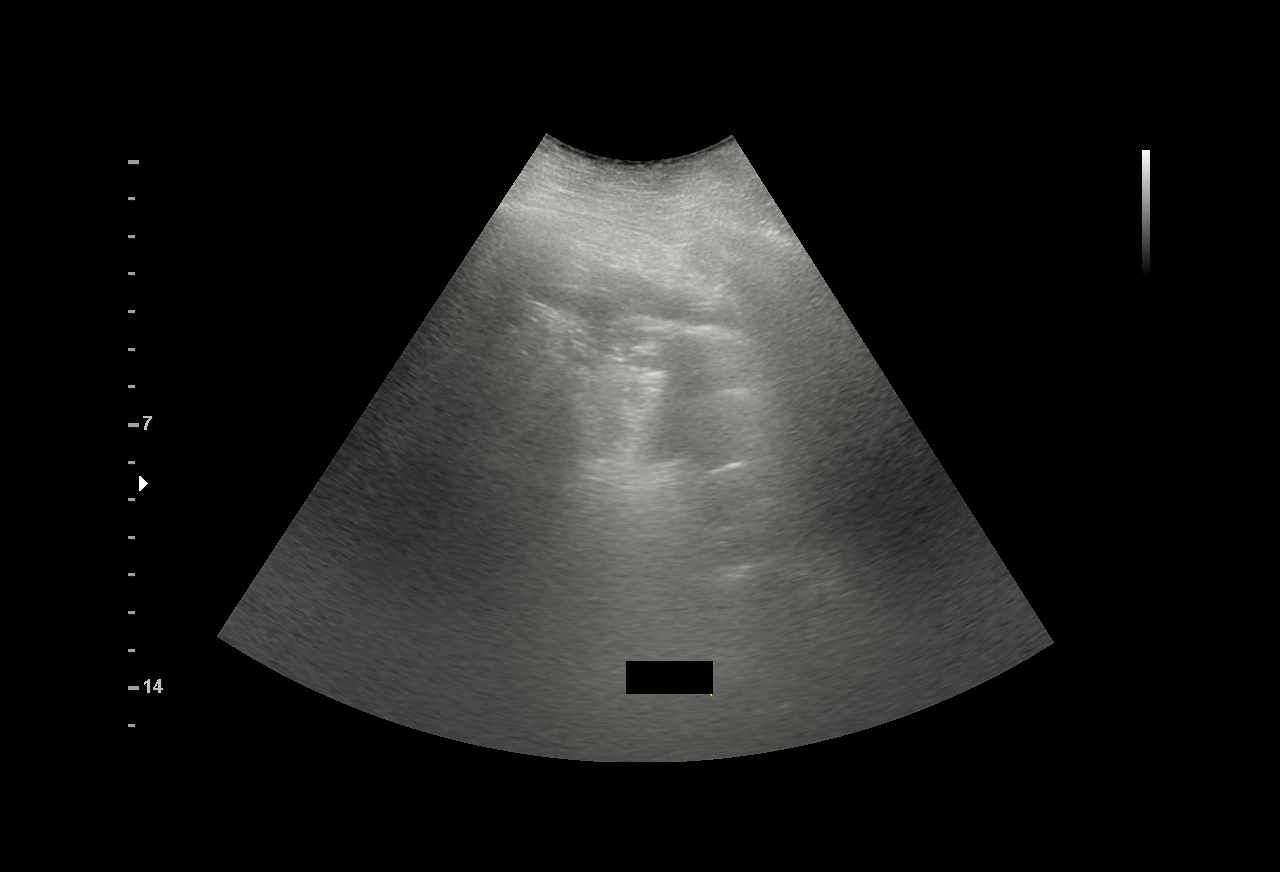

[3 of 3 positions shown; findings below may reference images not displayed]

EXAM:
ULTRASOUND GUIDED THERAPEUTIC LEFT THORACENTESIS

MEDICATIONS:
10 mL of 1% lidocaine

COMPLICATIONS:
None immediate.

PROCEDURE:
An ultrasound guided thoracentesis was thoroughly discussed with the
patient and questions answered. The benefits, risks, alternatives
and complications were also discussed. The patient understands and
wishes to proceed with the procedure. Written consent was obtained.

Ultrasound was performed to localize and mark an adequate pocket of
fluid in the left chest. The area was then prepped and draped in the
normal sterile fashion. 1% Lidocaine was used for local anesthesia.
Under ultrasound guidance a 6 Fr Safe-T-Centesis catheter was
introduced. Thoracentesis was performed. The catheter was removed
and a dressing applied.
FINDINGS: A total of approximately 900 mL of hazy amber fluid was removed.
IMPRESSION: Successful ultrasound guided left thoracentesis yielding 900 mL of
pleural fluid.
